# Patient Record
Sex: Female | Born: 1945 | Race: White | Hispanic: No | Marital: Married | Smoking: Former smoker
Health system: Southern US, Community
[De-identification: ages and names within clinical notes are randomized; demographics above are authoritative.]

## PROBLEM LIST (undated history)

## (undated) DIAGNOSIS — R0602 Shortness of breath: Secondary | ICD-10-CM

## (undated) DIAGNOSIS — M1990 Unspecified osteoarthritis, unspecified site: Secondary | ICD-10-CM

## (undated) DIAGNOSIS — R1013 Epigastric pain: Secondary | ICD-10-CM

## (undated) DIAGNOSIS — M545 Low back pain, unspecified: Secondary | ICD-10-CM

## (undated) DIAGNOSIS — E785 Hyperlipidemia, unspecified: Secondary | ICD-10-CM

## (undated) DIAGNOSIS — I209 Angina pectoris, unspecified: Secondary | ICD-10-CM

## (undated) DIAGNOSIS — F329 Major depressive disorder, single episode, unspecified: Secondary | ICD-10-CM

## (undated) DIAGNOSIS — J42 Unspecified chronic bronchitis: Secondary | ICD-10-CM

## (undated) DIAGNOSIS — F32A Depression, unspecified: Secondary | ICD-10-CM

## (undated) DIAGNOSIS — I10 Essential (primary) hypertension: Secondary | ICD-10-CM

## (undated) DIAGNOSIS — Z8719 Personal history of other diseases of the digestive system: Secondary | ICD-10-CM

## (undated) DIAGNOSIS — R51 Headache: Secondary | ICD-10-CM

## (undated) DIAGNOSIS — Z9289 Personal history of other medical treatment: Secondary | ICD-10-CM

## (undated) DIAGNOSIS — R251 Tremor, unspecified: Secondary | ICD-10-CM

## (undated) DIAGNOSIS — E039 Hypothyroidism, unspecified: Secondary | ICD-10-CM

## (undated) DIAGNOSIS — F419 Anxiety disorder, unspecified: Secondary | ICD-10-CM

## (undated) DIAGNOSIS — G8929 Other chronic pain: Secondary | ICD-10-CM

## (undated) DIAGNOSIS — J449 Chronic obstructive pulmonary disease, unspecified: Secondary | ICD-10-CM

## (undated) DIAGNOSIS — K219 Gastro-esophageal reflux disease without esophagitis: Secondary | ICD-10-CM

## (undated) HISTORY — DX: Hyperlipidemia, unspecified: E78.5

## (undated) HISTORY — DX: Depression, unspecified: F32.A

## (undated) HISTORY — PX: VAGINAL HYSTERECTOMY: SUR661

## (undated) HISTORY — DX: Major depressive disorder, single episode, unspecified: F32.9

## (undated) HISTORY — DX: Epigastric pain: R10.13

## (undated) HISTORY — DX: Anxiety disorder, unspecified: F41.9

## (undated) HISTORY — PX: LUMBAR DISC SURGERY: SHX700

## (undated) HISTORY — DX: Tremor, unspecified: R25.1

## (undated) HISTORY — PX: FRACTURE SURGERY: SHX138

## (undated) HISTORY — PX: BACK SURGERY: SHX140

---
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
## 1995-04-19 HISTORY — PX: CARDIAC CATHETERIZATION: SHX172

## 1999-04-14 ENCOUNTER — Other Ambulatory Visit: Admission: RE | Admit: 1999-04-14 | Discharge: 1999-04-14 | Payer: Self-pay | Admitting: Obstetrics and Gynecology

## 1999-04-28 ENCOUNTER — Encounter: Payer: Self-pay | Admitting: Obstetrics and Gynecology

## 1999-04-28 ENCOUNTER — Ambulatory Visit (HOSPITAL_COMMUNITY): Admission: RE | Admit: 1999-04-28 | Discharge: 1999-04-28 | Payer: Self-pay | Admitting: Obstetrics and Gynecology

## 1999-05-02 ENCOUNTER — Ambulatory Visit (HOSPITAL_COMMUNITY): Admission: RE | Admit: 1999-05-02 | Discharge: 1999-05-02 | Payer: Self-pay | Admitting: Obstetrics and Gynecology

## 2000-02-17 ENCOUNTER — Encounter: Payer: Self-pay | Admitting: *Deleted

## 2000-02-17 ENCOUNTER — Inpatient Hospital Stay (HOSPITAL_COMMUNITY): Admission: EM | Admit: 2000-02-17 | Discharge: 2000-02-18 | Payer: Self-pay | Admitting: *Deleted

## 2000-02-18 ENCOUNTER — Encounter: Payer: Self-pay | Admitting: Cardiology

## 2000-04-12 ENCOUNTER — Other Ambulatory Visit: Admission: RE | Admit: 2000-04-12 | Discharge: 2000-04-12 | Payer: Self-pay | Admitting: Obstetrics and Gynecology

## 2000-05-31 ENCOUNTER — Ambulatory Visit (HOSPITAL_COMMUNITY): Admission: RE | Admit: 2000-05-31 | Discharge: 2000-05-31 | Payer: Self-pay | Admitting: Internal Medicine

## 2000-05-31 ENCOUNTER — Encounter: Payer: Self-pay | Admitting: Obstetrics and Gynecology

## 2000-05-31 ENCOUNTER — Ambulatory Visit (HOSPITAL_COMMUNITY): Admission: RE | Admit: 2000-05-31 | Discharge: 2000-05-31 | Payer: Self-pay | Admitting: Obstetrics and Gynecology

## 2000-06-03 ENCOUNTER — Encounter: Payer: Self-pay | Admitting: Internal Medicine

## 2000-06-08 ENCOUNTER — Ambulatory Visit (HOSPITAL_COMMUNITY): Admission: RE | Admit: 2000-06-08 | Discharge: 2000-06-08 | Payer: Self-pay | Admitting: Internal Medicine

## 2000-06-08 ENCOUNTER — Encounter (INDEPENDENT_AMBULATORY_CARE_PROVIDER_SITE_OTHER): Payer: Self-pay | Admitting: *Deleted

## 2000-06-08 ENCOUNTER — Encounter: Payer: Self-pay | Admitting: Internal Medicine

## 2000-06-14 ENCOUNTER — Encounter: Payer: Self-pay | Admitting: Obstetrics and Gynecology

## 2000-06-14 ENCOUNTER — Encounter: Admission: RE | Admit: 2000-06-14 | Discharge: 2000-06-14 | Payer: Self-pay | Admitting: Obstetrics and Gynecology

## 2000-09-23 ENCOUNTER — Encounter (INDEPENDENT_AMBULATORY_CARE_PROVIDER_SITE_OTHER): Payer: Self-pay | Admitting: *Deleted

## 2000-09-23 ENCOUNTER — Ambulatory Visit (HOSPITAL_COMMUNITY): Admission: RE | Admit: 2000-09-23 | Discharge: 2000-09-23 | Payer: Self-pay | Admitting: Internal Medicine

## 2000-09-23 ENCOUNTER — Encounter: Payer: Self-pay | Admitting: Internal Medicine

## 2000-09-24 ENCOUNTER — Encounter (INDEPENDENT_AMBULATORY_CARE_PROVIDER_SITE_OTHER): Payer: Self-pay

## 2000-09-24 ENCOUNTER — Ambulatory Visit (HOSPITAL_COMMUNITY): Admission: RE | Admit: 2000-09-24 | Discharge: 2000-09-24 | Payer: Self-pay | Admitting: Internal Medicine

## 2000-09-25 ENCOUNTER — Encounter: Payer: Self-pay | Admitting: Internal Medicine

## 2001-03-28 ENCOUNTER — Other Ambulatory Visit: Admission: RE | Admit: 2001-03-28 | Discharge: 2001-03-28 | Payer: Self-pay | Admitting: Obstetrics and Gynecology

## 2001-05-28 ENCOUNTER — Encounter: Payer: Self-pay | Admitting: Internal Medicine

## 2001-06-04 ENCOUNTER — Ambulatory Visit (HOSPITAL_COMMUNITY): Admission: RE | Admit: 2001-06-04 | Discharge: 2001-06-04 | Payer: Self-pay | Admitting: Internal Medicine

## 2001-06-04 ENCOUNTER — Encounter (INDEPENDENT_AMBULATORY_CARE_PROVIDER_SITE_OTHER): Payer: Self-pay | Admitting: *Deleted

## 2001-06-04 ENCOUNTER — Encounter: Payer: Self-pay | Admitting: Internal Medicine

## 2001-06-09 ENCOUNTER — Encounter: Admission: RE | Admit: 2001-06-09 | Discharge: 2001-09-07 | Payer: Self-pay | Admitting: Internal Medicine

## 2001-06-16 ENCOUNTER — Ambulatory Visit (HOSPITAL_COMMUNITY): Admission: RE | Admit: 2001-06-16 | Discharge: 2001-06-16 | Payer: Self-pay | Admitting: Obstetrics and Gynecology

## 2001-06-16 ENCOUNTER — Encounter: Payer: Self-pay | Admitting: Obstetrics and Gynecology

## 2001-10-15 ENCOUNTER — Encounter: Admission: RE | Admit: 2001-10-15 | Discharge: 2002-01-13 | Payer: Self-pay | Admitting: Internal Medicine

## 2002-01-19 ENCOUNTER — Encounter: Admission: RE | Admit: 2002-01-19 | Discharge: 2002-04-19 | Payer: Self-pay | Admitting: Internal Medicine

## 2002-03-28 ENCOUNTER — Ambulatory Visit (HOSPITAL_COMMUNITY): Admission: RE | Admit: 2002-03-28 | Discharge: 2002-03-28 | Payer: Self-pay | Admitting: Orthopedic Surgery

## 2002-03-28 ENCOUNTER — Encounter: Payer: Self-pay | Admitting: Orthopedic Surgery

## 2004-11-29 ENCOUNTER — Emergency Department (HOSPITAL_COMMUNITY): Admission: EM | Admit: 2004-11-29 | Discharge: 2004-11-29 | Payer: Self-pay | Admitting: Emergency Medicine

## 2004-12-03 ENCOUNTER — Emergency Department (HOSPITAL_COMMUNITY): Admission: EM | Admit: 2004-12-03 | Discharge: 2004-12-03 | Payer: Self-pay | Admitting: Emergency Medicine

## 2004-12-14 ENCOUNTER — Emergency Department (HOSPITAL_COMMUNITY): Admission: EM | Admit: 2004-12-14 | Discharge: 2004-12-14 | Payer: Self-pay | Admitting: Emergency Medicine

## 2005-01-05 ENCOUNTER — Ambulatory Visit (HOSPITAL_COMMUNITY): Admission: RE | Admit: 2005-01-05 | Discharge: 2005-01-06 | Payer: Self-pay | Admitting: Neurosurgery

## 2005-01-23 ENCOUNTER — Encounter: Admission: RE | Admit: 2005-01-23 | Discharge: 2005-02-01 | Payer: Self-pay | Admitting: Neurosurgery

## 2007-04-02 ENCOUNTER — Emergency Department (HOSPITAL_COMMUNITY): Admission: EM | Admit: 2007-04-02 | Discharge: 2007-04-02 | Payer: Self-pay | Admitting: Family Medicine

## 2007-09-20 ENCOUNTER — Emergency Department (HOSPITAL_COMMUNITY): Admission: EM | Admit: 2007-09-20 | Discharge: 2007-09-20 | Payer: Self-pay | Admitting: Emergency Medicine

## 2007-09-30 ENCOUNTER — Encounter: Admission: RE | Admit: 2007-09-30 | Discharge: 2007-09-30 | Payer: Self-pay | Admitting: Cardiology

## 2007-10-14 ENCOUNTER — Emergency Department (HOSPITAL_COMMUNITY): Admission: EM | Admit: 2007-10-14 | Discharge: 2007-10-14 | Payer: Self-pay | Admitting: Emergency Medicine

## 2007-11-10 ENCOUNTER — Encounter: Admission: RE | Admit: 2007-11-10 | Discharge: 2007-11-10 | Payer: Self-pay | Admitting: Neurosurgery

## 2007-11-25 ENCOUNTER — Encounter: Admission: RE | Admit: 2007-11-25 | Discharge: 2008-01-22 | Payer: Self-pay | Admitting: Neurosurgery

## 2007-12-17 HISTORY — PX: US ECHOCARDIOGRAPHY: HXRAD669

## 2008-03-30 ENCOUNTER — Encounter (INDEPENDENT_AMBULATORY_CARE_PROVIDER_SITE_OTHER): Payer: Self-pay | Admitting: *Deleted

## 2008-03-30 ENCOUNTER — Emergency Department (HOSPITAL_COMMUNITY): Admission: EM | Admit: 2008-03-30 | Discharge: 2008-03-30 | Payer: Self-pay | Admitting: Emergency Medicine

## 2008-04-01 ENCOUNTER — Encounter: Admission: RE | Admit: 2008-04-01 | Discharge: 2008-04-01 | Payer: Self-pay | Admitting: Neurosurgery

## 2008-06-08 ENCOUNTER — Encounter: Admission: RE | Admit: 2008-06-08 | Discharge: 2008-06-08 | Payer: Self-pay | Admitting: Orthopedic Surgery

## 2008-08-20 HISTORY — PX: LAPAROSCOPIC CHOLECYSTECTOMY: SUR755

## 2008-09-30 ENCOUNTER — Encounter: Admission: RE | Admit: 2008-09-30 | Discharge: 2008-09-30 | Payer: Self-pay | Admitting: Cardiology

## 2009-05-20 ENCOUNTER — Inpatient Hospital Stay (HOSPITAL_COMMUNITY): Admission: EM | Admit: 2009-05-20 | Discharge: 2009-05-27 | Payer: Self-pay | Admitting: Emergency Medicine

## 2009-05-22 ENCOUNTER — Encounter (INDEPENDENT_AMBULATORY_CARE_PROVIDER_SITE_OTHER): Payer: Self-pay | Admitting: *Deleted

## 2009-05-23 ENCOUNTER — Encounter (INDEPENDENT_AMBULATORY_CARE_PROVIDER_SITE_OTHER): Payer: Self-pay | Admitting: *Deleted

## 2009-05-24 ENCOUNTER — Encounter (INDEPENDENT_AMBULATORY_CARE_PROVIDER_SITE_OTHER): Payer: Self-pay | Admitting: *Deleted

## 2009-05-27 ENCOUNTER — Encounter (INDEPENDENT_AMBULATORY_CARE_PROVIDER_SITE_OTHER): Payer: Self-pay | Admitting: *Deleted

## 2009-06-19 ENCOUNTER — Emergency Department (HOSPITAL_COMMUNITY): Admission: EM | Admit: 2009-06-19 | Discharge: 2009-06-20 | Payer: Self-pay | Admitting: Emergency Medicine

## 2009-06-20 ENCOUNTER — Encounter (INDEPENDENT_AMBULATORY_CARE_PROVIDER_SITE_OTHER): Payer: Self-pay | Admitting: *Deleted

## 2009-06-24 ENCOUNTER — Inpatient Hospital Stay (HOSPITAL_COMMUNITY): Admission: EM | Admit: 2009-06-24 | Discharge: 2009-06-29 | Payer: Self-pay | Admitting: Emergency Medicine

## 2009-06-24 ENCOUNTER — Encounter (INDEPENDENT_AMBULATORY_CARE_PROVIDER_SITE_OTHER): Payer: Self-pay | Admitting: *Deleted

## 2009-06-26 ENCOUNTER — Encounter (INDEPENDENT_AMBULATORY_CARE_PROVIDER_SITE_OTHER): Payer: Self-pay | Admitting: *Deleted

## 2009-06-26 ENCOUNTER — Encounter (INDEPENDENT_AMBULATORY_CARE_PROVIDER_SITE_OTHER): Payer: Self-pay | Admitting: Surgery

## 2009-06-29 ENCOUNTER — Encounter (INDEPENDENT_AMBULATORY_CARE_PROVIDER_SITE_OTHER): Payer: Self-pay | Admitting: *Deleted

## 2009-10-24 ENCOUNTER — Emergency Department (HOSPITAL_COMMUNITY): Admission: EM | Admit: 2009-10-24 | Discharge: 2009-10-24 | Payer: Self-pay | Admitting: Emergency Medicine

## 2009-11-22 ENCOUNTER — Ambulatory Visit (HOSPITAL_COMMUNITY): Admission: RE | Admit: 2009-11-22 | Discharge: 2009-11-22 | Payer: Self-pay | Admitting: Obstetrics and Gynecology

## 2010-06-10 ENCOUNTER — Emergency Department (HOSPITAL_COMMUNITY): Admission: EM | Admit: 2010-06-10 | Discharge: 2010-06-10 | Payer: Self-pay | Admitting: Emergency Medicine

## 2010-06-12 ENCOUNTER — Encounter (INDEPENDENT_AMBULATORY_CARE_PROVIDER_SITE_OTHER): Payer: Self-pay | Admitting: *Deleted

## 2010-06-12 ENCOUNTER — Emergency Department (HOSPITAL_COMMUNITY): Admission: EM | Admit: 2010-06-12 | Discharge: 2010-06-12 | Payer: Self-pay | Admitting: Emergency Medicine

## 2010-06-20 ENCOUNTER — Encounter: Payer: Self-pay | Admitting: Internal Medicine

## 2010-06-20 ENCOUNTER — Ambulatory Visit: Payer: Self-pay | Admitting: Cardiology

## 2010-06-22 ENCOUNTER — Encounter (INDEPENDENT_AMBULATORY_CARE_PROVIDER_SITE_OTHER): Payer: Self-pay | Admitting: *Deleted

## 2010-06-22 ENCOUNTER — Telehealth: Payer: Self-pay | Admitting: Internal Medicine

## 2010-06-23 ENCOUNTER — Ambulatory Visit: Payer: Self-pay | Admitting: Gastroenterology

## 2010-06-23 ENCOUNTER — Encounter: Payer: Self-pay | Admitting: Internal Medicine

## 2010-06-23 DIAGNOSIS — R197 Diarrhea, unspecified: Secondary | ICD-10-CM | POA: Insufficient documentation

## 2010-06-23 DIAGNOSIS — R142 Eructation: Secondary | ICD-10-CM

## 2010-06-23 DIAGNOSIS — R143 Flatulence: Secondary | ICD-10-CM

## 2010-06-23 DIAGNOSIS — R1084 Generalized abdominal pain: Secondary | ICD-10-CM | POA: Insufficient documentation

## 2010-06-23 DIAGNOSIS — R141 Gas pain: Secondary | ICD-10-CM | POA: Insufficient documentation

## 2010-06-23 DIAGNOSIS — R198 Other specified symptoms and signs involving the digestive system and abdomen: Secondary | ICD-10-CM | POA: Insufficient documentation

## 2010-06-23 DIAGNOSIS — K310 Acute dilatation of stomach: Secondary | ICD-10-CM | POA: Insufficient documentation

## 2010-06-23 DIAGNOSIS — E785 Hyperlipidemia, unspecified: Secondary | ICD-10-CM | POA: Insufficient documentation

## 2010-06-23 DIAGNOSIS — I10 Essential (primary) hypertension: Secondary | ICD-10-CM

## 2010-06-23 LAB — CONVERTED CEMR LAB
Basophils Absolute: 0 10*3/uL (ref 0.0–0.1)
Eosinophils Relative: 2.2 % (ref 0.0–5.0)
HCT: 40.3 % (ref 36.0–46.0)
Hemoglobin: 13.8 g/dL (ref 12.0–15.0)
Lymphs Abs: 2.2 10*3/uL (ref 0.7–4.0)
MCV: 98.2 fL (ref 78.0–100.0)
Monocytes Absolute: 0.6 10*3/uL (ref 0.1–1.0)
Monocytes Relative: 7.9 % (ref 3.0–12.0)
Neutro Abs: 4.5 10*3/uL (ref 1.4–7.7)
Platelets: 173 10*3/uL (ref 150.0–400.0)
RDW: 13.1 % (ref 11.5–14.6)

## 2010-06-27 ENCOUNTER — Ambulatory Visit: Payer: Self-pay | Admitting: Internal Medicine

## 2010-06-29 ENCOUNTER — Ambulatory Visit: Payer: Self-pay | Admitting: Cardiology

## 2010-07-18 ENCOUNTER — Telehealth: Payer: Self-pay | Admitting: Physician Assistant

## 2010-07-24 ENCOUNTER — Telehealth: Payer: Self-pay | Admitting: Physician Assistant

## 2010-07-28 DIAGNOSIS — Z860100 Personal history of colon polyps, unspecified: Secondary | ICD-10-CM | POA: Insufficient documentation

## 2010-07-28 DIAGNOSIS — D3500 Benign neoplasm of unspecified adrenal gland: Secondary | ICD-10-CM | POA: Insufficient documentation

## 2010-07-28 DIAGNOSIS — K5730 Diverticulosis of large intestine without perforation or abscess without bleeding: Secondary | ICD-10-CM | POA: Insufficient documentation

## 2010-07-28 DIAGNOSIS — J449 Chronic obstructive pulmonary disease, unspecified: Secondary | ICD-10-CM

## 2010-07-28 DIAGNOSIS — F419 Anxiety disorder, unspecified: Secondary | ICD-10-CM

## 2010-07-28 DIAGNOSIS — I252 Old myocardial infarction: Secondary | ICD-10-CM | POA: Insufficient documentation

## 2010-08-03 ENCOUNTER — Ambulatory Visit: Payer: Self-pay | Admitting: Internal Medicine

## 2010-08-03 DIAGNOSIS — R12 Heartburn: Secondary | ICD-10-CM

## 2010-08-20 HISTORY — PX: CATARACT EXTRACTION W/ INTRAOCULAR LENS  IMPLANT, BILATERAL: SHX1307

## 2010-08-24 ENCOUNTER — Ambulatory Visit: Admit: 2010-08-24 | Payer: Self-pay | Admitting: Internal Medicine

## 2010-08-29 ENCOUNTER — Ambulatory Visit
Admission: RE | Admit: 2010-08-29 | Discharge: 2010-08-29 | Payer: Self-pay | Source: Home / Self Care | Attending: Internal Medicine | Admitting: Internal Medicine

## 2010-08-29 ENCOUNTER — Encounter: Payer: Self-pay | Admitting: Internal Medicine

## 2010-09-04 ENCOUNTER — Encounter: Payer: Self-pay | Admitting: Internal Medicine

## 2010-09-19 NOTE — Discharge Summary (Signed)
Summary: Percutaneous Cholecystostomy Drain  NAMESIRENITY, Traci [name]                ACCOUNT NO.:  [id number]      MEDICAL RECORD NO.:  [id number]          PATIENT TYPE:  INP      LOCATION:  5156                         FACILITY:  [facility]      PHYSICIAN:  Maisie Fus A. Cornett, M.D.DATE OF BIRTH:  [birth year]      DATE OF ADMISSION:  05/20/2009   DATE OF DISCHARGE:  05/27/2009                                  DISCHARGE SUMMARY      PRIMARY CARDIOLOGIST:  Cassell Clement, MD      ADMITTING PHYSICIAN:  Vesta Mixer, MD      PROCEDURES:   1. Cardiac catheterization by Dr. Elease Hashimoto on May 23, 2009, showing       mild coronary artery disease and a small obtuse marginal artery       that appears to be subtotally occluded which could have caused her       mild troponin elevation.   2. Percutaneous cholecystostomy drain placement on May 24, 2009, by       Dr. Irish Lack.      CONSULTANTS:   1. Dr. Luisa Hart with [facility].   2. Dr. Fredia Sorrow with interventional radiology.      REASON FOR ADMISSION:  Traci [name] is a 65 year old female with a history   of COPD, hyperlipidemia, and hypertension, who presents with 2-day   history of fairly constant epigastric pain.  The pain is worse with   eating but is not associated to exertion.  The pain is unrelenting and   constant, and she presented to the emergency department with nausea and   some vomiting.  At this time, she was admitted due to epigastric/chest   pain to rule out any cardiac possibilities.  Please see admitting   history and physical for further details.      ADMITTING DIAGNOSES:   1. Abdominal pain rule out abdominal source/cardiac source.   2. Chronic obstructive pulmonary disease.      HOSPITAL COURSE:  At this time, the patient was admitted.  Her chest x-   ray revealed a calcified granuloma of the left upper lobe but otherwise   is normal.  Her EKG was also normal.  They did repeat cardiac enzymes   which on  her second hospital day revealed a bump in her troponin up to   1.5.  Due to this bump, the patient had a CT angiogram of the chest   which revealed no evidence of pulmonary embolus or other active disease   but showed some mild pulmonary emphysema.  The patient then had an   ultrasound of the abdomen to try and determine where her course of pain   was coming from and this revealed multiple gallstones with some   gallbladder wall thickening and a positive Murphy sign with findings   suspicious for acute cholecystitis.  The following day due to her   elevation in troponin the patient underwent a cardiac catheterization   with the finding of mild coronary artery disease.  Otherwise, her   cardiac  catheterization was essentially normal, and general surgery was   consulted to assess the patient for her ultrasound findings consistent   with acute cholecystitis.  At this time, the patient has already had   approximately 5 days of abdominal pain, and her gallbladder wall   thickening was approximately 1 cm.  At this time, it was felt that the   risks of surgery may be higher due to the extended length of pain and   the thickness of her gallbladder wall.  Therefore, it was decided that   the patient would get a percutaneous cholecystostomy drain.  The patient   agreed to this, then the following day this was placed.  The patient   tolerated this procedure well, and over the next several days the   patient's diet was advanced, and by May 27, 2009, the patient was   tolerating a regular diet and having some soreness secondary to her   drain but otherwise stable.      The patient showed some signs and symptoms of deconditioning while in   the hospital.  Therefore, PT and OT evaluation was requested.  At this   time, physical therapy has evaluated the patient and requested for home   health physical therapy.  I am currently awaiting occupational therapy   evaluation to determine whether this is  needed at home as well.   Otherwise, the patient is stable for discharge home with home health to   help manage her drain as well.      DISCHARGE DIAGNOSES:   1. Acute on chronic cholecystitis.   2. Status post percutaneous cholecystostomy drain placement.   3. Chronic obstructive pulmonary disease.   4. Mild coronary artery disease.   5. Hyperlipidemia.   6. Hypertension.      DISCHARGE MEDICATIONS:  The patient is to resume all home medications   including:   1. Vicodin 5/325 one to two p.o. q.4 h. p.r.n. pain.   2. Lovastatin 20 mg daily.   3. Wellbutrin 100 mg 2 tablets daily.   4. Atenolol 50 mg half a tablet b.i.d.      The patient states she is out of Vicodin and so new prescription for   Vicodin is given.      DISCHARGE INSTRUCTIONS:  The patient may resume normal activity and   increase her activity slowly and walk up steps.  She may shower with her   drain; however, she is not to bathe.  She is to keep the area around her   drain as dry as possible.  She is to resume a low-sodium, heart-healthy,   low-fat diet.  She is to call our office for fever greater than 101.5 or   worsening abdominal pain.  She is to change the dressing to her drain   daily and flush with 10 mL of normal saline once daily.  Otherwise, she   is to record her daily outputs per interventional radiology and she is   to call them with any drain concerns at [phone].  Otherwise, she is to   follow up with Dr. Luisa Hart at our office in 2-3 weeks and with Dr.   Patty Sermons as already scheduled.               Letha Cape, PA               Maisie Fus A. Cornett, M.D.   Electronically Signed         KEO/MEDQ  D:  05/27/2009  T:  05/27/2009  Job:  [id number]      cc:   Vesta Mixer, M.D.   Cassell Clement, M.D.

## 2010-09-19 NOTE — Procedures (Addendum)
Summary: Colonoscopy  Patient: [name] Note: All result statuses are Final unless otherwise noted.  Tests: (1) Colonoscopy (COL)   COL Colonoscopy           DONE     [facility]     [address]     [place], [place]  [address]          COLONOSCOPY PROCEDURE REPORT          PATIENT:  Traci [name], Traci [name]  MR#:  [id number]     BIRTHDATE:  05-May-1946, 63 yrs. old  GENDER:  female     ENDOSCOPIST:  Hedwig Morton. Juanda Chance, MD     REF. BY:  Cassell Clement, M.D.     PROCEDURE DATE:  06/27/2010     PROCEDURE:  Colonoscopy 10272     ASA CLASS:  Class II     INDICATIONS:  history of pre-cancerous (adenomatous) colon polyps     last colon 2002, abd. pain, bloating     MEDICATIONS:   Versed 5 mg, Fentanyl 50 mcg          DESCRIPTION OF PROCEDURE:   After the risks benefits and     alternatives of the procedure were thoroughly explained, informed     consent was obtained.  Digital rectal exam was performed and     revealed no rectal masses.   The LB [id number]-[id number] endoscope     was introduced through the anus and advanced to the cecum, which     was identified by both the appendix and ileocecal valve, without     limitations.  The quality of the prep was good, using MiraLax.     The instrument was then slowly withdrawn as the colon was fully     examined.     <<PROCEDUREIMAGES>>          FINDINGS:  No polyps or cancers were seen (see image1, image2, and     image3).   Retroflexed views in the rectum revealed no     abnormalities.    The scope was then withdrawn from the patient     and the procedure completed.          COMPLICATIONS:  None     ENDOSCOPIC IMPRESSION:     1) No polyps or cancers     2) Normal colonoscopy     RECOMMENDATIONS:     1) high fiber diet     REPEAT EXAM:  In 10 year(s) for.          ______________________________     Hedwig Morton. Juanda Chance, MD          CC:          n.     eSIGNED:   Hedwig Morton. Brodie at 06/27/2010 08:29 AM          [place], [place], [id number]  Note: An exclamation mark (!) indicates a result that was not dispersed into the flowsheet. Document Creation Date: 06/27/2010 8:29 AM _______________________________________________________________________  (1) Order result status: Final Collection or observation date-time: 06/27/2010 08:23 Requested date-time:  Receipt date-time:  Reported date-time:  Referring Physician:   Ordering Physician: Lina Sar [phone]) Specimen Source:  Source: Launa Grill Order Number: [id number] Lab site:   Appended Document: Colonoscopy    Clinical Lists Changes  Observations: Added new observation of COLONNXTDUE: 06/2020 (06/27/2010 13:11)

## 2010-09-19 NOTE — Consult Note (Signed)
Summary: Cholelthiasis  Traci [name], Traci [name]                ACCOUNT NO.:  [id number]      MEDICAL RECORD NO.:  [id number]          PATIENT TYPE:  INP      LOCATION:  3701                         FACILITY:  [facility]      PHYSICIAN:  Maisie Fus A. Cornett, M.D.DATE OF BIRTH:  [birth year]      DATE OF CONSULTATION:  05/23/2009   DATE OF DISCHARGE:                                    CONSULTATION      REQUESTING PHYSICIAN:  Cassell Clement, MD      PRIMARY CARDIOLOGIST AND PRIMARY PHYSICIAN:  Cassell Clement, MD      REASON FOR CONSULTATION:  Cholelithiasis.      HISTORY OF PRESENT ILLNESS:  Traci [name] is a 65 year old [place] female   with a history of chronic obstructive pulmonary disease, hypertension,   hyperlipidemia, anxiety, and depression who began having epigastric and   right upper quadrant abdominal pain last Thursday night.  She states   this awoken her in the middle of the night and describes it as a sharp   stabbing pain that then becomes nagging and constant.  She states this   is not associated with food.  She then developed nausea and emesis.  She   then presented to the emergency department on Saturday due to her   continued pain.  At this time, she had an extensive cardiac workup.  She   did have an elevation in her troponins up to 1.5; however, these have   come down to 0.85 at this time.  She had a cardiac catheterization   today, which showed mild coronary artery disease and a partially   occluded obtuse marginal vessel that may be contributing to her   increased troponin.  She otherwise has had ultrasound of her abdomen,   which showed gallstones and gallbladder wall thickening of 10 mm.  At   this time due to an essentially negative cardiac catheterization, it was   felt the patient's pain may be due to her gallbladder and therefore we   were asked to assess the patient.      REVIEW OF SYSTEMS:  Please see HPI.  Otherwise, the patient does admit   to having similar  symptoms and episodes like this in the past; however,   they have resolved.  She denies any abdominal association with eating.   She does admit to continued abdominal pain and continued nausea, but no   further emesis.      PAST MEDICAL HISTORY:   1. COPD.   2. Hypertension.   3. Hyperlipidemia.   4. Anxiety and depression.      PAST SURGICAL HISTORY:   1. Prior back surgery.   2. Partial hysterectomy.      SOCIAL HISTORY:  The patient is married.  She does have a daughter who   is currently in the room with her.  She does admit to smoking just over   a pack of cigarettes a day; however, she denies any use of alcohol.      ALLERGIES:  ASPIRIN and according  to the chart CEPHALOSPORINS.      MEDICATIONS:   1. Atenolol 25 mg daily.   2. Wellbutrin 200 mg daily.   3. Trazodone 300 mg a day.   4. Lovastatin 20 mg a day.      PHYSICAL EXAMINATION:  GENERAL:  Traci [name] is a very pleasant 65-year-   old [place] female who is currently somewhat sedated secondary to her   cardiac catheterization, but otherwise in no acute distress.   VITAL SIGNS:  Temperature 99.2, pulse 81, respirations 20, blood   pressure 124/74.   HEENT:  Head is normocephalic, atraumatic.  Sclerae noninjected.  Pupils   are equal, round, and reactive to light.  Ears and nose without any   obvious masses or lesions.  No rhinorrhea.  Mouth is pink and moist.   Throat shows no exudate.   NECK:  Supple.  Trachea is midline.   HEART:  Regular rate and rhythm.  Normal S1 and S2.  No murmurs,   gallops, or rubs are noted.  +2 carotid rate and pedal pulses   bilaterally.   LUNGS:  Clear to auscultation bilaterally with no wheezes, rhonchi, or   rales noted.  Respiratory effort is nonlabored.   ABDOMEN:  Soft, but tender in the epigastrium and in the right upper   quadrant with a positive Murphy sign.  She does have active bowel sounds   and is otherwise nondistended.  No masses or hernias are noted.   MUSCULOSKELETAL:   All four extremities are symmetrical with no cyanosis,   clubbing, or edema.   NEURO:  Cranial nerves II through XII appear to be grossly intact.  Deep   tendon reflex exam is deferred at this time.   Psych:  The patient is alert and oriented x3 with an appropriate affect.      LABS AND DIAGNOSTICS:  Troponin is currently 0.85, which is down from   1.5.  [place] blood cell count 8700, hemoglobin 13.4, hematocrit 39.3,   platelet count is 155,000.  AST is 17, ALT 18, alkaline phosphatase 50,   total bilirubin 0.6.  Ultrasound of the abdomen reveals cholelithiasis   with gallbladder wall thickening up to 10 mm consistent with acute   cholecystitis.  CT angio of the chest is negative for pulmonary embolus.      IMPRESSION:   1. Abdominal pain consistent with acute cholecystitis.   2. Hypertension.   3. Chronic obstructive pulmonary disease.   4. Hyperlipidemia.   5. History of anxiety and depression.      PLAN:  The patient is currently 5 days out from when her pain began on   Thursday.  The patient is at somewhat higher risk for complication due   to the amount of inflammation present of the gallbladder.  Therefore,   the patient may benefit from a percutaneous drain of the gallbladder   versus antibiotics and an elective resection.  Currently, the   patient's pain has slightly improved; however, it is still present, and   therefore I will discuss this patient with Dr. Luisa Hart to further   determine an appropriate path for the patient.  In the meantime, we will   check a CBC as well as CMET in the morning.               Letha Cape, PA               Maisie Fus A. Cornett, M.D.   Electronically Signed  KEO/MEDQ  D:  05/23/2009  T:  05/24/2009  Job:  [id number]      cc:   Cassell Clement, M.D.   Vesta Mixer, M.D.

## 2010-09-19 NOTE — Progress Notes (Signed)
Summary: triage  Phone Note From Other Clinic Call back at [phone]   Caller: Juliette Alcide, nurse Call For: Dr. Juanda Chance Reason for Call: Schedule Patient Appt Summary of Call: Dr. Patty Sermons referred pt for "lower abd pain" and pt was sch'ed for next available... when Juliette Alcide called pt to give her appt time/date, pt said she cant wait that long and that she has more symptoms than just abd pain; also experiencing abd swelling and immediate BM after eating... Juliette Alcide asked that you call her to discuss triage appt rather than the pt Initial call taken by: Vallarie Mare,  June 22, 2010 8:54 AM  Follow-up for Phone Call        Spoke with [facility] @ Dr. Yevonne Pax office. Patient scheduled with Mike Gip, PA on 06/23/10 @ 9:30 AM. Follow-up by: Jesse Fall RN,  June 22, 2010 9:34 AM     Appended Document: triage Did call pt to verify  that Dr. Yevonne Pax office advised her of the appt she has with Mike Gip PA-C  tomorrow 06-23-10. I advised her to bring ins card, and list of medications.

## 2010-09-19 NOTE — Progress Notes (Signed)
Summary: Questions  Phone Note Call from Patient Call back at Home Phone [phone]   Caller: Patient Call For: Mike Gip Reason for Call: Talk to Nurse Summary of Call: Pt is calling about her ct results, says she never heard the results Initial call taken by: [place] Johnson,  July 24, 2010 3:49 PM  Follow-up for Phone Call        The pt is still having some pain. She is some better.  I let her know per Amy that the CT scan did not show diverticulitis. It did show diverticulosis.  I made her an appt for 08-03-2010

## 2010-09-19 NOTE — Assessment & Plan Note (Signed)
Summary: abd pain, abd swelling, BM immediately after eating/Traci [name]   History of Present Illness Visit Type: Initial Consult Primary GI MD: Sheryn Bison MD FACP FAGA Primary Provider: Pecola Lawless, MD Requesting Provider: Pecola Lawless, MD Chief Complaint: abdominal pain and swelling  Has loose BMs immediately after eating History of Present Illness:   Traci [name] 65 YO FEMALE  REFERRED TODAY FOR C/ ABDOMINAL PAIN AND SWELLING. SHE IS KNOWN REMOTELY TO DR. Juanda Chance FROM COLONOSCOPY IN 2002 AT WHICH TIME SHE HAD ADENOMATOUS POLYPS.  SHE SAYS SHE HAS BEEN HAVING HER CURRENT SXS OVER THE PAST COUPLE YEARS BUT HAS BEEN MORE CONSTANT OVER THE PAST 6-8 MONTHS. SHE DESCRIBES A "PULLING" FEELING ON BOTH SIDES OF HER ABDOMEN , AND A SENSATION OF BLOATING OR "TIGHTNESS". HER STOOLS ARE LOOSE AND SOMETIMES DARK-THOUGH THIS HAS BEEN CHRONIC AND WAS PRESENT PRIOR TO HER GB SURGERY LAST FALL IN OCT. 2010.Traci KitchenSHE SAYS SHE SEES OCCASIONAL SCANT AMTS OF BRB WHICH SHE ATTRIBUTES TO HEMORRHOIDS.  APPETITE FAIR,WEIGHT ACTUALLY UP 20 POUNDS OVER THE PAST 2 YEARS. SHE HAS BEEN PLACED ON OMEPRAZOLE WHICH HELPS HER HEARTBURN BUT HAS NOT HELPED HER OTHER SXS.   GI Review of Systems    Reports abdominal pain, acid reflux, and  nausea.     Location of  Abdominal pain: generalized.    Denies belching, bloating, chest pain, dysphagia with liquids, dysphagia with solids, heartburn, loss of appetite, vomiting, vomiting blood, weight loss, and  weight gain.      Reports change in bowel habits.     Denies anal fissure, black tarry stools, constipation, diarrhea, diverticulosis, fecal incontinence, heme positive stool, hemorrhoids, irritable bowel syndrome, jaundice, light color stool, liver problems, rectal bleeding, and  rectal pain. Preventive Screening-Counseling & Management  Alcohol-Tobacco     Smoking Status: current      Drug Use:  no.      Current Medications (verified): 1)  Lovastatin 20 Mg Tabs (Lovastatin)  .Traci [name].. 1 By Mouth Once Daily 2)  Bupropion Hcl 200 Mg Xr12h-Tab (Bupropion Hcl) .Traci [name].. 1 By Mouth Once Daily 3)  Citalopram Hydrobromide 40 Mg Tabs (Citalopram Hydrobromide) .Traci [name].. 1 By Mouth Once Daily 4)  Atenolol 50 Mg Tabs (Atenolol) .... 1/2 Tablet By Mouth Two Times A Day 5)  Omeprazole 20 Mg Cpdr (Omeprazole) .Traci [name].. 1-2 By Mouth Once Daily 6)  Trazodone Hcl 150 Mg Tabs (Trazodone Hcl) .... 2 By Mouth At Bedtime  Allergies (verified): 1)  ! Cephalexin  Past History:  Past Medical History: AFib Hypertension HYPERLIPIDEMIA COLON POLYPS/ADENOMATOUS 2002  Past Surgical History: Hysterectomy  Back surgery Cholecystectomy  Family History: Reviewed history and no changes required. No FH of Colon Cancer: Family History of Heart Disease:  Uterine Cancer-daughter  Social History: Reviewed history and no changes required. Married   2 girls Patient currently smokes. 1 PPD Alcohol Use - no Daily Caffeine Use  4 per day Illicit Drug Use - no Smoking Status:  current Drug Use:  no  Review of Systems       The patient complains of back pain, shortness of breath, and sleeping problems.  The patient denies allergy/sinus, anemia, anxiety-new, arthritis/joint pain, blood in urine, breast changes/lumps, change in vision, confusion, cough, coughing up blood, depression-new, fainting, fatigue, fever, headaches-new, hearing problems, heart murmur, heart rhythm changes, itching, menstrual pain, muscle pains/cramps, night sweats, nosebleeds, pregnancy symptoms, skin rash, sore throat, swelling of feet/legs, swollen lymph glands, thirst - excessive , urination - excessive , urination changes/pain, urine leakage, vision changes, and voice change.  SEE HPI  Vital Signs:  Patient profile:   65 year old female Height:      69 inches Weight:      178 pounds BMI:     26.38 Pulse rate:   76 / minute Pulse rhythm:   regular BP sitting:   92 / 68  (left arm)  Vitals Entered By: Milford Cage  NCMA (June 23, 2010 9:13 AM)  Physical Exam  General:  Well developed, well nourished, no acute distress. Head:  Normocephalic and atraumatic. Eyes:  PERRLA, no icterus. Lungs:  Clear throughout to auscultation.decreased BS bilateral.   Heart:  Regular rate and rhythm; no murmurs, rubs,  or bruits. Abdomen:  SOFT, NO VISIBLE DISTENTION, MILD RATHER DIFFUSE TENDERNESS, NO MASS OR HSM,BS+ Rectal:  HEME NEGATIVE STOOL Extremities:  No clubbing, cyanosis, edema or deformities noted. Neurologic:  Alert and  oriented x4;  grossly normal neurologically. Psych:  Alert and cooperative. Normal mood and affect.   Impression & Recommendations:  Problem # 1:  ABDOMINAL PAIN, GENERALIZED (ICD-789.07) Assessment Deteriorated 65 YO FEMALE  S/P LAP CHOLE 10/10 WITH C/O CHRONIC LOOSE STOOLS PRECEEDING CHOLECYSTECTOMY, ABDOMINAL BLOATING DISTENTION AND DISCOMFORT OF SEVERAL MONTH DURATION. ETIOLOGY IS NOT CLEAR.   LABS TODAY CT ABD/PELVIS  PT IS OVERDUE FOR FOLLOW UP COLONOSCOPY -WILL SCHEDULE WITH DR. Marcille Buffy IN DETAIL WITH PT. CONTINUE OMEPRAZOLE 20 MG DAILY FOR NOW. Orders: TLB-CRP-High Sensitivity (C-Reactive Protein) (86140-FCRP) TLB-CBC Platelet - w/Differential (85025-CBCD) CT Abdomen/Pelvis with Contrast (CT Abd/Pelvis w/con) Colonoscopy (Colon)  Problem # 2:  HYPERLIPIDEMIA (ICD-272.4) Assessment: Comment Only  Problem # 3:  HYPERTENSION (ICD-401.9) Assessment: Comment Only  Problem # 4:  PERSONAL HISTORY OF COLONIC POLYPS (ICD-V12.72) Assessment: Comment Only LAST COLON 2002- ADENOMATOUS POLYPS- DUE FOR FOLLOW UP;SCHEDULED AS ABOVE Orders: CT Abdomen/Pelvis with Contrast (CT Abd/Pelvis w/con) Colonoscopy (Colon)  Patient Instructions: 1)  Please go to lab, basement level. 2)  We scheduled the Colonoscopy with Dr. Lina Sar on 06-27-10. 3)  Directions and brochure provided. 4)  The prescription for the colonoscopy prep was sent to [facility].   5)   [facility] Patient Information Guide given to patient. 6)  We scheduled the CT scan for Thurs 06-29-10.   7)  Directions and contrast  provided. 8)  Copy sent to : Cassell Clement, Md 9)  The medication list was reviewed and reconciled.  All changed / newly prescribed medications were explained.  A complete medication list was provided to the patient / caregiver. Prescriptions: DULCOLAX 5 MG  TBEC (BISACODYL) Day before procedure take 2 at 3pm and 2 at 8pm.  #4 x 0   Entered by:   Lowry Ram NCMA   Authorized by:   Sammuel Cooper PA-c   Signed by:   Lowry Ram NCMA on 06/23/2010   Method used:   Electronically to        [facility] [address] #[id number]* (retail)       [address] [place], [place]  [address]       Ph: [phone]       Fax: [phone]   RxID:   [id number] METOCLOPRAMIDE HCL 10 MG  TABS (METOCLOPRAMIDE HCL) As per prep instructions.  #2 x 0   Entered by:   Lowry Ram NCMA   Authorized by:   Sammuel Cooper PA-c   Signed by:   Lowry Ram NCMA on 06/23/2010   Method used:   Electronically to  [facility] [address] #[id number]* (retail)       [address] [place], [place]  [address]       Ph: [phone]       Fax: [phone]   RxID:   [id number] MIRALAX   POWD (POLYETHYLENE GLYCOL 3350) As per prep  instructions.  #255gm x 0   Entered by:   Lowry Ram NCMA   Authorized by:   Sammuel Cooper PA-c   Signed by:   Lowry Ram NCMA on 06/23/2010   Method used:   Electronically to        [facility] [address] [id number]* (retail)       [address] [place], [place]  [address]       Ph: [phone]       Fax: [phone]   RxID:   [id number]

## 2010-09-19 NOTE — Progress Notes (Signed)
Summary: CT results  Phone Note Call from Patient Call back at Home Phone [phone]   Caller: Patient Call For: Mike Gip Reason for Call: Talk to Nurse Details for Reason: CT Results Summary of Call: Pt. called  to get results of CT. Please call and advise. Initial call taken by: Schuyler Amor,  July 18, 2010 2:27 PM  Follow-up for Phone Call        Informed the pt that I routed the CT results to Amy in the system and she will see them tomorrow.  I did urge her that she did have some diverticulosis but no diverticulitis.  The pt understood all of this due to researching it. I told her if Amy has any further instruction for her we will call her.  She thanked me for calling. Follow-up by: Joselyn Glassman,  July 18, 2010 4:46 PM     Appended Document: CT results [facility]

## 2010-09-19 NOTE — Letter (Signed)
Summary: New Patient letter  [facility]  [address] [place], [place] [address]   Phone: [phone]  Fax: [phone]       06/22/2010 MRN: [id number]  [facility] [address] [place], [place]  [address]  Dear Ms. Daman,  Welcome to the Gastroenterology Division at [facility].    You are scheduled to see Dr. Juanda Chance on 08/24/2010 at 9:15AM on the 3rd floor at [facility], [address]  We ask that you try to arrive at our office 15 minutes prior to your appointment time to allow for check-in.  We would like you to complete the enclosed self-administered evaluation form prior to your visit and bring it with you on the day of your appointment.  We will review it with you.  Also, please bring a complete list of all your medications or, if you prefer, bring the medication bottles and we will list them.  Please bring your insurance card so that we may make a copy of it.  If your insurance requires a referral to see a specialist, please bring your referral form from your primary care physician.  Co-payments are due at the time of your visit and may be paid by cash, check or credit card.     Your office visit will consist of a consult with your physician (includes a physical exam), any laboratory testing he/she may order, scheduling of any necessary diagnostic testing (e.g. x-ray, ultrasound, CT-scan), and scheduling of a procedure (e.g. Endoscopy, Colonoscopy) if required.  Please allow enough time on your schedule to allow for any/all of these possibilities.    If you cannot keep your appointment, please call [phone] to cancel or reschedule prior to your appointment date.  This allows [place] the opportunity to schedule an appointment for another patient in need of care.  If you do not cancel or reschedule by 5 p.m. the business day prior to your appointment date, you will be charged a $50.00 late cancellation/no-show fee.    Thank you for choosing [facility] for your medical needs.  We appreciate the opportunity to care for you.  Please visit [place] at our website  to learn more about our practice.                     Sincerely,                                                             The Gastroenterology Division

## 2010-09-19 NOTE — Op Note (Signed)
Summary: Cholecystectomy  NAME:  Traci [name], Traci [name]                ACCOUNT NO.:  [id number]      MEDICAL RECORD NO.:  [id number]          PATIENT TYPE:  INP      LOCATION:  5125                         FACILITY:  [facility]      PHYSICIAN:  Maisie Fus A. Cornett, M.D.DATE OF BIRTH:  [birth year]      DATE OF PROCEDURE:  06/26/2009   DATE OF DISCHARGE:                                  OPERATIVE REPORT      PREOPERATIVE DIAGNOSIS:  Acute-on-chronic cholecystitis.      POSTOPERATIVE DIAGNOSIS:  Acute-on-chronic cholecystitis.      PROCEDURE:  Laparoscopic cholecystectomy with intraoperative   cholangiogram.      SURGEON:  Thomas A. Cornett, MD      ASSISTANT:  Wilmon Arms. Tsuei, MD      ANESTHESIA:  General endotracheal anesthesia with 0.25% Sensorcaine   local.      ESTIMATED BLOOD LOSS:  100 mL.      SPECIMEN:  Gallbladder to pathology.      DRAIN:  19 Blake drains to her gallbladder fossa.      INDICATIONS FOR PROCEDURE:  The patient is a 65 year old female who was   seen a month ago while an inpatient at [facility] secondary to chest   pain and abdominal pain.  She was found to have acute-on-chronic   cholecystitis and a percutaneous cholecystostomy tube was placed and   temporize her until her medical issues had been resolved.  She called   the office Friday complaining of increasing abdominal pain, especially   with flushing of her cholecystostomy tube and was admitted to the   hospital for further workup.  CT showed no dislodgement of the tube and   a normal Jacob count, but she continued to have significant pain and it   was felt that cholecystectomy at this point is indicated to help relieve   this as the tube was no longer helpful to her.  Her medical issues have   been settled as well, and she is ready for surgery.      DESCRIPTION OF PROCEDURE:  The patient was brought to the operating room   and placed supine.  After induction of general anesthesia, I removed her   cholecystostomy tube.  Abdomen was then prepped and draped in sterile   fashion.  A 1 cm infraumbilical incision was made and dissection was   carried down to her fascia.  The fascia was opened in the midline and   was grabbed between 2 Kochers.  Pursestring suture of 0 Vicryl was   placed and a 12-mm Hasson cannula was placed under direct vision.   Pneumoperitoneum was created to 15 mmHg of CO2, and a laparoscope was   placed.  She had dense adhesions from her transverse colon up to her   gallbladder fossa, it looked like.  An 11-mm subxiphoid port was placed   under direct vision and two 5 mL ports were placed in the right upper   quadrant.  We then began to take the omentum down away from the right  lobe of liver.  We carefully dissected the transverse colon off this   area.  We were very careful stay well away from this and limit the use   of cautery from this as well.  We eventually were able to identify the   gallbladder and it had some acute on chronic signs of cholecystitis.   This was relatively soft and we removed and grabbed it quite easily.   Once we cut the remainder of the omentum and transverse colon away from   this, I carefully examined and saw no evidence of injury to it or any   violation of the serosa.  We then grabbed the gallbladder by its dome   and by its infundibulum and pulled it to the patient's right lower   quadrant.  We used cautery to dissect away some peritoneal adhesions to   expose the cystic duct and cystic artery.  The cystic artery was   anterior.  We went ahead and double clipped this and divided this.  This   exposed her cystic artery much easier.  We created a window behind this   and achieved a critical view.  Clips were placed in the gallbladder side   of the cystic duct and a small incision was made in it.  Through a   separate stab incision, a Cook cholangiogram catheter was introduced,   placed in the cystic duct and held in place by a clip.   Intraoperative   cholangiogram was performed which showed free flow contrast down a   tortuous cystic duct into the common bile duct and the duodenum.  There   was also free flow contrast in the common hepatic duct into the right   and left ductules.  There was some dilation to about 6-8 mm, it looked   like.  We saw no stones, strictures, or extravasation.  We then removed   her catheter.  We placed 3 clips across the cystic duct stump and   divided it.  There was a posterior branch of cystic artery controlled   between clips as well and divided.  Cautery was used to dissect the   gallbladder from the gallbladder fossa.  There was spillage of   gallbladder contents since the gallbladder was significantly inflamed,   but no spillage of stones.  Gallbladder was then placed in an EndoCatch   bag.  I reexamined the right upper quadrant.  There was considerable   oozing, but we irrigated this out with saline until it was relatively   dry.  Surgicel was then placed in the gallbladder bed given the overall   surface area noted from the acute inflammation.  There was no evidence   of leaks or bile or bleeding in the cystic duct or cystic arteries   respectively.  We elected to place a 19 Blake drain into the right upper   quadrant and did this through the most lateral port site.  This was   secured to the skin with 3-0 nylon.  This was then placed to bulb   suction.  Excess irrigation was suctioned out.  We then extracted the   gallbladder through the umbilical port and passed it off the field.   This fascial opening was closed with an already existing pursestring   suture.  We reexamined the gallbladder bed, found to be relatively   hemostatic and then suctioned any excess irrigation, removed her ports   without signs of port site bleeding.  The skin was  closed with 4-0   Monocryl.  Dermabond was applied.  All final counts were correct at this   portion of the case.  The patient was awoken and  taken to recovery in   satisfactory condition.               Thomas A. Cornett, M.D.   Electronically Signed            TAC/MEDQ  D:  06/26/2009  T:  06/27/2009  Job:  [id number]      cc:   Dr. Patty Sermons

## 2010-09-19 NOTE — Discharge Summary (Signed)
Summary: Cholecystitis  [name], ROEDIGER                ACCOUNT NO.:  [id number]      MEDICAL RECORD NO.:  [id number]          PATIENT TYPE:  INP      LOCATION:  5125                         FACILITY:  [facility]      PHYSICIAN:  Velora Heckler, MD      DATE OF BIRTH:  June 15, 1946      DATE OF ADMISSION:  06/24/2009   DATE OF DISCHARGE:  06/29/2009                                  DISCHARGE SUMMARY      HISTORY OF PRESENT ILLNESS:  Ms. Traci [name] is a 65 year old female, who a   little over a month ago, had a bad case of cholecystitis.  She   subsequently underwent percutaneous cholecystostomy drainage due to   possible associated heart history.  Apparently at that time, she had   heart workup including heart catheterization, which was negative.  She   was followed up by Dr. Luisa Hart in the office, and was starting to have   some increased pain.  Decision was made to readmit the patient for   workup of this pain and probable procession of laparoscopic   cholecystectomy.      SUMMARY OF HOSPITAL COURSE:  The patient was readmitted on June 24, 2009, with a diagnosis of chronic cholecystitis, status post   percutaneous cholecystostomy drain.      A CT scan was performed on her admission, which showed stable position   of the percutaneous cholecystostomy tube and no associated abscess or   other fluid collection.  Decision was made then to proceed with   laparoscopic cholecystectomy.  This was carried out by Dr. Luisa Hart on   June 26, 2009.  This was associated with a cholangiogram, negative   for obstruction or filling defect.  The patient's surgery was completed,   and a JP drain was left intact for drainage purposes.  Postoperatively,   the patient had a reasonably uncomplicated course with the exception of   a little bit of dyspepsia that was resolved with Maalox and Pepcid twice   daily as well as a little bit of hypoxia.  The patient is a chronic   smoker and likely has a some  degree of COPD to begin with, but a chest x-   ray was ordered on November 9.  This showed evidence of bibasilar   atelectasis consistent with poor inspiratory effort.  This was rectified   use incentive spirometer, and the patient was given instructions to   continue this at home.  She was otherwise tolerating regular diet and   moving her bowels, and is determined to be stable for discharge as of   June 29, 2009.      DISCHARGE DIAGNOSES:   1. Chronic cholecystitis, status laparoscopic cholecystectomy.   2. Hypertension.   3. Hyperlipidemia.   4. Chronic anxiety.      DISCHARGE PLAN:  The patient will be given discharge instructions   regarding laparoscopic cholecystectomy as well as instructions to empty   her JP drain.  She was given prescription for Vicodin 5/500 one  tablet   q.6 h. p.r.n. pain   as well as resuming her home medications, and I have also instructed the   patient to add Pepcid 20 mg twice daily to her medication regimen.  She   should follow up with her primary care physician for the dyspepsia   and/or smoking related issues and follow up with Dr. Luisa Hart in   approximately 1 week's time for postop evaluation.               Brayton El, PA-C               Velora Heckler, MD   Electronically Signed         KB/MEDQ  D:  06/29/2009  T:  06/29/2009  Job:  [id number]      cc:   Thomas A. Cornett, M.D.

## 2010-09-19 NOTE — Letter (Signed)
Summary: [facility]   Imported By: Sherian Rein 07/10/2010 12:05:08  _____________________________________________________________________  External Attachment:    Type:   Image     Comment:   External Document

## 2010-09-19 NOTE — Op Note (Signed)
Summary: COLON W/BX                         [facility]  Patient:    Traci [name], Traci [name]                       MRN: [id number] Adm. Date:  [id number] Attending:  Mervin Hack CC:         Clovis Pu. Patty Sermons, M.D.   Procedure Report  PROCEDURE:  Colonoscopy.  ENDOSCOPIST:  Hedwig Morton. Juanda Chance, M.D.  INDICATIONS:  This 65 year old [place] female smoker has complaint of diffuse abdominal tenderness and abdominal pain.  She has been tried on an irritable bowel regimen.  CT scan of the abdomen showed a large left adrenal consistent with adrenal adenoma.  CT scan was repeated three months later with no significant change in the appearance of the left adrenal gland.  Because of the abdominal distention and complaints by the patient of continued rectal bleeding, ______ stools and black diarrhea, she is undergoing colonoscopy.  ENDOSCOPE:  Olympus single-channel videoscope.  SEDATION:  Versed 10 mg IV, Demerol 50 mg IV.  FINDINGS:  Olympus single-channel videoscope was passed directly in through the rectum to the sigmoid colon.  Patient was monitored by pulse oximetry; her oxygen saturation were 97-98% on 2 L of nasal O2.  Anal canal showed essentially normal rectal mucosa.  In the rectal ampulla, there were two small polyps which were diminutive, measuring 4 to 5 mm in diameter.  They were sessile and soft.  They were ablated with hot biopsy and sent to pathology. Sigmoid colon was rather tortuous with large haustral folds but no diverticula were seen.  Patient was comfortable throughout the procedure.  Splenic flexure, transverse colon and hepatic flexure were unremarkable and there was a normal ascending colon.  In the cecal pouch, there was another diminutive polyp measuring about 5 mm in diameter.  It was flat and sessile.  It was also ablated with hot biopsy and sent to pathology in a separate specimen bottle. Ileocecal valve itself was normal.  Video-photographs were  obtained. Colonoscope was then retracted and the colon decompressed.  Patient tolerated procedure well.  IMPRESSION 1. Diminutive polyps of the left and right colon, status post polypectomies. 2. No explanation for patients abdominal pain.  PLAN:  I believe the patient has an irritable bowel syndrome and will be treated accordingly with antispasmodics and fiber supplements and also possible tranquilizers or even dietary modification.  She was advised to stop smoking. DD:  09/24/00 TD:  09/25/00 Job: [id number]   FINAL DIAGNOSIS    ***MICROSCOPIC EXAMINATION AND DIAGNOSIS***    CECAL POLYP: TUBULAR ADENOMA. NO HIGH GRADE DYSPLASIA OR   MALIGNANCY IDENTIFIED.    RECTUM, POLYP: HYPERPLASTIC POLYP. NO ADENOMATOUS CHANGE OR   MALIGNANCY IDENTIFIED.    ab   Date Reported: 09/25/2000 Marcie Bal, MD   *** Electronically Signed Out By TAZ ***    Clinical information   Abdominal pain. (tmc)    specimen(s) obtained   1: Cecal polyp   2: Rectum, polyp    Gross Description   I. Received in formalin is a tan, soft tissue fragment that is   submitted in toto. Size: 0.4 cm    II. Received in formalin are tan, soft tissue fragments that are   submitted in toto. Number: 3   Size: 0.3 cm each (SSW:atb 2/5)    ab/

## 2010-09-19 NOTE — Assessment & Plan Note (Signed)
Summary: Gastroenterology     [facility]   GASTROENTEROLOGY OFFICE NOTE  NAMESTEPHANIEANN, [name]            OFFICE NO:  [id number]    DATE:  May 28, 2001      The patient returns for followup of her abdominal distention and bloating.  She was initially evaluated on May 14, 2000 and again seen on September 13, 2000.  She is considered to have irritable bowel syndrome.  Her abdominal distention and bloating is partly due to weight gain, musculoskeletal obesity, as well as smoking and distention as well as acute irritable bowel syndrome.  Extensive gastrointestinal evaluation included upper endoscopy, ultrasound of the upper abdomen, CT scan of the abdomen, as well as colonoscopy.  She continues to smoke.    PHYSICAL EXAMINATION:   Blood pressure 100/70, pulse 70, and weight 158 pounds.  Her lungs show decreased breath sounds.  No wheezes.  Cardiac with normal S1 and normal S2.  Neck is supple without adenopathy.  Thyroid is not enlarged.  Abdomen is mildly obese, tense, voluntary guarding but no tenderness.  There are no ascites.  Bowel sounds are active.  There is no costovertebral angle tenderness.  No palpable mass.  Rectal exam with hemoccult negative stools.  Extremities:  No edema.   IMPRESSION: 1.  Weight gain from her usual of 125 pounds to currently 158 pounds.   2.  Continued smoking with some aerophagia.   3.  Abdominal distention partly due to irritable bowel syndrome and constipation.   PLAN: 1.  Trial of Zelnorm promotility agent, 6 mg p.o. b.i.d.   2.  Refer to nutritionist for weight loss, low-fat diet.   3.  Gastric emptying scan to assess her gastrointestinal motility.     Hedwig Morton. Juanda Chance, M.D.   [id number] D: 05/28/01;    T: 05/29/01;    Job # [id number]

## 2010-09-19 NOTE — Op Note (Signed)
Summary: EGD                         [facility]  Patient:    Traci [name], Traci [name]                       MRN: [id number] Adm. Date:  [id number] Disc. Date: [id number] Attending:  Mervin Hack CC:         Malachi Pro. Ambrose Mantle, M.D.   Procedure Report  NAME OF PROCEDURE:  Upper endoscopy.  INDICATION:  This 65 year old [place] female smoker has complained of early satiety, epigastric pain, abdominal distention and bloating.  She has vomiting on occasion.  Her weight has increased about 15 to 20 pounds.  She takes anti-inflammatory agents, Vioxx and prior to the Celebrex.  She has also been on aspirin 81 mg a day.  She is on Zoloft.  The patient was started on Protonix 40 mg a day approximately two weeks ago with essentially no improvement of her abdominal symptoms.  Ultrasound of the gallbladder is pending.  She is undergoing upper endoscopy for evaluation of her epigastric pain and other GI symptoms.  ENDOSCOPE:  Olympus single-channel video endoscope.  SEDATION:  Versed 9 mg IV, Demerol 25 mg IV.  FINDINGS:  Esophagus:  Olympus single-channel video endoscope passed under direct vision through posterior pharynx into the esophagus.  The patient was monitored by pulse oximetry.  Her oxygen saturations were satisfactory.  Her gag reflex was not present.  Proximate and mid esophagus was normal.  There was a muscular ring at the distal esophagus, proximal to the GE junction.  The GE junction was mildly fibrotic, but there was no stricture.  There were no stricture.  There was no erosions.  Distal to the Z-line was a 3 cm hiatal hernia which was not reducible.  Stomach:  The stomach was insufflated with air and again confirmed presence of hiatal hernia.  Mucosa in the hiatal hernia was normal.  There were no inflammatory changes.  Gastric body and gastric antrum was unremarkable as well as pyloric outlet.  Biopsies were taken from gastric antrum for Clo-test.  Retroflexion of  endoscope revealed normal fundus and cardia.  Duodenum:  The duodenal bulb and descending duodenum was normal.  IMPRESSION: 1. 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 2. Hiatal hernia. 3. Clo-test  PLAN: 1. Increase Protonix to 40 mg p.o. b.i.d. 2. Await results of the ultrasound.  If negative, proceed with CT    scan of the abdomen and pelvis to rule out intra-abdominal process. 3. Stop smoking. 4. Hold aspirin and Vioxx for now. DD:  05/31/00 TD:  05/31/00 Job: [id number]

## 2010-09-19 NOTE — Letter (Signed)
Summary: [facility] [place] Instructions  [facility]  [address] [place], [place] [address]   Phone: [phone]  Fax: [phone]       [facility]    [birth year]    MRN: [id number]       Procedure Day /Date:06-27-10     Arrival Time: 7:30 AM      Procedure Time :8:00 AM     Location of Procedure:                    X     [facility] (4th Floor)  PREPARATION FOR COLONOSCOPY WITH MIRALAX  Starting 4days prior to your procedure 06-23-10 do not eat nuts, seeds, popcorn, corn, beans, peas,  salads, or any raw vegetables.  Do not take any fiber supplements (e.g. Metamucil, Citrucel, and Benefiber). ____________________________________________________________________________________________________   THE DAY BEFORE YOUR PROCEDURE         DATE:06-26-10 DAY: Monday  1   Drink clear liquids the entire day-NO SOLID FOOD  2   Do not drink anything colored red or purple.  Avoid juices with pulp.  No orange juice.  3   Drink at least 64 oz. (8 glasses) of fluid/clear liquids during the day to prevent dehydration and help the prep work efficiently.  CLEAR LIQUIDS INCLUDE: Water Jello Ice Popsicles Tea (sugar ok, no milk/cream) Powdered fruit flavored drinks Coffee (sugar ok, no milk/cream) Gatorade Juice: apple, Recore grape, Sadlon cranberry  Lemonade Clear bullion, consomm, broth Carbonated beverages (any kind) Strained chicken noodle soup Hard Candy  4   Mix the entire bottle of Miralax with 64 oz. of Gatorade/Powerade in the morning and put in the refrigerator to chill.  5   At 3:00 pm take 2 Dulcolax/Bisacodyl tablets.  6   At 4:30 pm take one Reglan/Metoclopramide tablet.  7  Starting at 5:00 pm drink one 8 oz glass of the Miralax mixture every 15-20 minutes until you have finished drinking the entire 64 oz.  You should finish drinking prep around 7:30 or 8:00 pm.  8   If you are nauseated, you may take the 2nd Reglan/Metoclopramide tablet at 6:30  pm.        9    At 8:00 pm take 2 more DULCOLAX/Bisacodyl tablets.     THE DAY OF YOUR PROCEDURE      DATE:  06-27-10 DAY: Tuesday  You may drink clear liquids until _  _  (2 HOURS BEFORE PROCEDURE).   MEDICATION INSTRUCTIONS  Unless otherwise instructed, you should take regular prescription medications with a small sip of water as early as possible the morning of your procedure.        OTHER INSTRUCTIONS  You will need a responsible adult at least 67 years of age to accompany you and drive you home.   This person must remain in the waiting room during your procedure.  Wear loose fitting clothing that is easily removed.  Leave jewelry and other valuables at home.  However, you may wish to bring a book to read or an iPod/MP3 player to listen to music as you wait for your procedure to start.  Remove all body piercing jewelry and leave at home.  Total time from sign-in until discharge is approximately 2-3 hours.  You should go home directly after your procedure and rest.  You can resume normal activities the day after your procedure.  The day of your procedure you should not:   Drive   Make legal decisions  Operate machinery   Drink alcohol   Return to work  You will receive specific instructions about eating, activities and medications before you leave.   The above instructions have been reviewed and explained to me by   _______________________    I fully understand and can verbalize these instructions _____________________________ Date _______

## 2010-09-21 NOTE — Letter (Signed)
Summary: EGD Instructions  [facility]  [address] [place], [place] [address]   Phone: [phone]  Fax: [phone]       Traci [name]    18-Sep-1945    MRN: [id number]       Procedure Day /Date: Tuesday 08/29/10     Arrival Time: 3:00 pm     Procedure Time: 4:00 pm     Location of Procedure:                    _ x _ [facility] (4th Floor)  PREPARATION FOR ENDOSCOPY   On 08/28/10 THE DAY OF THE PROCEDURE:  1.   No solid foods, milk or milk products are allowed after midnight the night before your procedure.  2.   Do not drink anything colored red or purple.  Avoid juices with pulp.  No orange juice.  3.  You may drink clear liquids until 2:00 pm, which is 2 hours before your procedure.                                                                                                CLEAR LIQUIDS INCLUDE: Water Jello Ice Popsicles Tea (sugar ok, no milk/cream) Powdered fruit flavored drinks Coffee (sugar ok, no milk/cream) Gatorade Juice: apple, Weins grape, Champney cranberry  Lemonade Clear bullion, consomm, broth Carbonated beverages (any kind) Strained chicken noodle soup Hard Candy   MEDICATION INSTRUCTIONS  Unless otherwise instructed, you should take regular prescription medications with a small sip of water as early as possible the morning of your procedure.              OTHER INSTRUCTIONS  You will need a responsible adult at least 67 years of age to accompany you and drive you home.   This person must remain in the waiting room during your procedure.  Wear loose fitting clothing that is easily removed.  Leave jewelry and other valuables at home.  However, you may wish to bring a book to read or an iPod/MP3 player to listen to music as you wait for your procedure to start.  Remove all body piercing jewelry and leave at home.  Total time from sign-in until discharge is approximately 2-3 hours.  You should go home  directly after your procedure and rest.  You can resume normal activities the day after your procedure.  The day of your procedure you should not:   Drive   Make legal decisions   Operate machinery   Drink alcohol   Return to work  You will receive specific instructions about eating, activities and medications before you leave.    The above instructions have been reviewed and explained to me by   Lamona Curl CMA Duncan Dull)  August 03, 2010 4:31 PM     I fully understand and can verbalize these instructions _____________________________ Date _________

## 2010-09-21 NOTE — Letter (Signed)
Summary: Patient [facility] Biopsy Results  [facility]  [address] [place], [place] [address]   Phone: [phone]  Fax: [phone]        September 04, 2010 MRN: [id number]    [facility] [address] [place], [place]  [address]    Dear Ms. Pasquarella,  I am pleased to inform you that the biopsies taken during your recent endoscopic examination did not show any evidence of cancer upon pathologic examination.The biopsies show mild gastritis  Additional information/recommendations:  __No further action is needed at this time.  Please follow-up with      your primary care physician for your other healthcare needs.  __ Please call [phone] to schedule a return visit to review      your condition.  _x_ Continue with the treatment plan as outlined on the day of your      exam.  __   Please call us if you are having persistent problems or have questions about your condition that have not been fully answered at this time.  Sincerely,  Hart Carwin MD  This letter has been electronically signed by your physician.  Appended Document: Patient Notice-Endo Biopsy Results Letter Mailed

## 2010-09-21 NOTE — Procedures (Signed)
Summary: Upper Endoscopy  Patient: Traci [name] Note: All result statuses are Final unless otherwise noted.  Tests: (1) Upper Endoscopy (EGD)   EGD Upper Endoscopy       DONE     [facility]     [address]     [place], [place]  [address]          ENDOSCOPY PROCEDURE REPORT          PATIENT:  [name], [name]  MR#:  [id number]     BIRTHDATE:  03/20/1946, 64 yrs. old  GENDER:  female          ENDOSCOPIST:  Reynol Arnone M. Denia Mcvicar, MD     Referred by:  Thomas Brackbill, M.D.          PROCEDURE DATE:  08/29/2010     PROCEDURE:  EGD with biopsy, 43239, Maloney Dilation of Esophagus     ASA CLASS:  Class II     INDICATIONS:  GERD, heartburn imroved since Prelosec increased to     bid          MEDICATIONS:   Versed 5 mg, Fentanyl 50 mcg     TOPICAL ANESTHETIC:  Exactacain Spray          DESCRIPTION OF PROCEDURE:   After the risks benefits and     alternatives of the procedure were thoroughly explained, informed     consent was obtained.  The [facility] GIF-H180 [id number] endoscope was     introduced through the mouth and advanced to the second portion of     the duodenum, without limitations.  The instrument was slowly     withdrawn as the mucosa was fully examined.     <<PROCEDUREIMAGES>>          A hiatal hernia was found (see image1). 1-2 cm hiatal hernia,     reducible  A stricture was found in the distal esophagus (see     image2, image3, and image8). nonobstructing stricture maloney     dilator 48F passed without difficulty  Otherwise the examination     was normal. With standard forceps, a biopsy was obtained and sent     to pathology. antrum, r/o H (see image5 and image6).pylori     Retroflexed views revealed no abnormalities.    The scope was then     withdrawn from the patient and the procedure completed.          COMPLICATIONS:  None          ENDOSCOPIC IMPRESSION:     1) Hiatal hernia     2) Stricture in the distal esophagus     3) Otherwise normal examination    s/p passage of 48F maloney dilator     RECOMMENDATIONS:     1) Anti-reflux regimen to be follow     2) Await biopsy results     continue Prelosec 20mg po bid          REPEAT EXAM:  In 0 year(s) for.          ______________________________     Luverta Korte M. Brit Carbonell, MD          CC:  Thomas Brackbill, M.D.          n.     eSIGNED:   Krisi Azua M. Nealy Hickmon at 08/29/2010 05:06 PM          [place], [place], [id number]  Note: An exclamation mark (!) indicates a result that was not dispersed into the flowsheet. Document   Creation Date: 08/29/2010 5:06 PM _______________________________________________________________________  (1) Order result status: Final Collection or observation date-time: 08/29/2010 16:57 Requested date-time:  Receipt date-time:  Reported date-time:  Referring Physician:   Ordering Physician: Ciclaly Mulcahey ([id number]) Specimen Source:  Source: EndoProS Filler Order Number: [id number] Lab site:  

## 2010-09-21 NOTE — Assessment & Plan Note (Signed)
Summary: F/U Abd pain, saw Mike Gip PA   History of Present Illness Visit Type: Follow-up Visit Primary GI MD: Sheryn Bison MD FACP FAGA Primary Provider: Pecola Lawless, MD Requesting Provider: na Chief Complaint: F/u from visit with Amy for lower abd pain, having BMs right after she eats and bloating. Pt states that she still has lower abd pain and bloating   History of Present Illness:   This is a 65 year old [place] female smoker with COPD who has chronic abdominal distention and discomfort. She just had a recent colonoscopy which was normal and a normal CT scan of the abdomen and pelvis in November 2011. Her past medical history includes COPD and coronary artery disease. She is status post MI, high blood pressure and status post remote cholecystectomy. She has regular soft bowel movements. She has a history of a 3 cm hiatal hernia on an upper endoscopy in 2001 which was done for dysphagia. She had a mild esophageal stricture at that time. She takes Prilosec 20 mg q.a.m. but has stopped and now has frequent heartburn at the end of the day.   GI Review of Systems    Reports abdominal pain and  bloating.     Location of  Abdominal pain: lower abdomen.    Denies acid reflux, belching, chest pain, dysphagia with liquids, dysphagia with solids, heartburn, loss of appetite, nausea, vomiting, vomiting blood, weight loss, and  weight gain.        Denies anal fissure, black tarry stools, change in bowel habit, constipation, diarrhea, diverticulosis, fecal incontinence, heme positive stool, hemorrhoids, irritable bowel syndrome, jaundice, light color stool, liver problems, rectal bleeding, and  rectal pain.    Current Medications (verified): 1)  Lovastatin 20 Mg Tabs (Lovastatin) .Marland Kitchen.. 1 By Mouth Once Daily 2)  Bupropion Hcl 200 Mg Xr12h-Tab (Bupropion Hcl) .Marland Kitchen.. 1 By Mouth Once Daily 3)  Citalopram Hydrobromide 40 Mg Tabs (Citalopram Hydrobromide) .Marland Kitchen.. 1 By Mouth Once Daily 4)  Atenolol 50  Mg Tabs (Atenolol) .... 1/2 Tablet By Mouth Two Times A Day 5)  Omeprazole 20 Mg Cpdr (Omeprazole) .Marland Kitchen.. 1-2 By Mouth Once Daily 6)  Trazodone Hcl 150 Mg Tabs (Trazodone Hcl) .... 2 By Mouth At Bedtime  Allergies (verified): 1)  ! 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 Hysterectomy Back surgery Cholecystectomy w/drain placment and removal  Family History: Reviewed history from 07/28/2010 and no changes required. No FH of Colon Cancer: Family History of Heart Disease: Father, Sister, Grandparents Uterine Cancer-daughter Family History of Liver Disease: 2 brothers with hepatitis C Cervical Cancer: Sister  Social History: Reviewed history from 06/23/2010 and no changes required. Married   2 girls Patient currently smokes. 1 PPD Alcohol Use - no Daily Caffeine Use  4 per day Illicit Drug Use - no  Review of Systems       The patient complains of arthritis/joint pain, back pain, and fatigue.  The patient denies allergy/sinus, anemia, anxiety-new, blood in urine, breast changes/lumps, change in vision, confusion, cough, coughing up blood, depression-new, fainting, fever, headaches-new, hearing problems, heart murmur, heart rhythm changes, itching, menstrual pain, muscle pains/cramps, night sweats, nosebleeds, pregnancy symptoms, shortness of breath, skin  rash, sleeping problems, sore throat, swelling of feet/legs, swollen lymph glands, thirst - excessive ,  urination - excessive , urination changes/pain, urine leakage, vision changes, and voice change.         Pertinent positive and negative review of systems were noted in the above HPI. All other ROS was otherwise negative.   Vital Signs:  Patient profile:   65 year old female Height:      69 inches Weight:      178 pounds BMI:     26.38 BSA:     1.97 Pulse rate:   88 / minute Pulse rhythm:   regular BP sitting:   100 / 62  (left arm) Cuff size:   regular  Vitals Entered By: Ok Anis CMA (August 03, 2010 3:41 PM)  Physical Exam  General:  Alert and oriented. Smells of smoke. Mouth:  No deformity or lesions, dentition normal. Neck:  Supple; no masses or thyromegaly. Lungs:  Clear throughout to auscultation. Heart:  Regular rate and rhythm; no murmurs, rubs,  or bruits. Abdomen:  protuberant abdomen in standing position which is reduced when she lays down. There is some voluntary guarding but when she is distracted abdomen becomes quite flat and relaxed. Bowel sounds are normoactive. There is mild tenderness diffusely in all quadrants but mostly in the left lower quadrant and in the epigastrium, nor ascites, liver edge at costal margin. No CVA tenderness. Rectal:  stool is soft and Hemoccult-negative. Extremities:  No clubbing, cyanosis, edema or deformities noted. Skin:  Intact without significant lesions or rashes. Psych:  Alert and cooperative. Normal mood and affect.   Impression & Recommendations:  Problem # 1:  ABDOMINAL PAIN, GENERALIZED (ICD-789.07) Patient has diffuse abdominal discomfort due to abdominal protuberance and bloating. This is likely related to irritable bowel syndrome and COPD. I suspect she has an  aerophagia. I also suspect pulmonary hyperinflation causing flattening of the diaphragm resulting in abdominal protuberance. She has poor muscle tone in her abdominal muscles. There is no ventral hernia. She is having lrefractory reflux and is at high risk for  Barrett's esophagus. We will proceed with an upper endoscopy. I asked her to increase her Prilosec to 20 mg twice a day.  Problem # 2:  DIVERTICULOSIS, COLON (ICD-562.10) Patient has bloating in the setting of a recent normal colonoscopy with the exception of diverticulosis. She is to start of Librax one p.o. b.i.d.  Other Orders: EGD (EGD)  Patient Instructions: 1)  You have been scheduled for an endoscopy. Please follow written prep instructions that were given to you today at your visit. 2)  Increase your Prilosec to two times a day dosing 3)  We have sent an electronic prescription of Librax to your pharmacy for you to take once every morning. 4)  Copy sent to : Cassell Clement, MD 5)  The medication list was reviewed and reconciled.  All changed / newly prescribed medications were explained.  A complete medication list was provided to the patient / caregiver. Prescriptions: LIBRAX 2.5-5 MG CAPS (CLIDINIUM-CHLORDIAZEPOXIDE) Take 1 capsule by mouth every morning  #30 x 1   Entered by:   Lamona Curl CMA (AAMA)   Authorized by:   Hart Carwin MD   Signed by:   Lamona Curl CMA (AAMA) on 08/03/2010   Method used:   Electronically to        [facility] [address] [id number]* (retail)       [address]  [place], [place]  [address]       Ph: [phone]       Fax: [phone]   RxID:   [id number] PRILOSEC 20 MG CPDR (OMEPRAZOLE) Take 1 tablet by mouth two times a day  #60 x 2   Entered by:   Lamona Curl CMA (AAMA)   Authorized by:   Hart Carwin MD   Signed by:   Lamona Curl CMA (AAMA) on 08/03/2010   Method used:   Electronically to        [id number]* (retail)       [address] [place], [place]  [address]       Ph: [phone]       Fax: [phone]   RxID:   [id number]

## 2010-11-07 NOTE — Procedures (Signed)
Summary: Upper Endoscopy  Patient: Naveyah Iacovelli Note: All result statuses are Final unless otherwise noted.  Tests: (1) Upper Endoscopy (EGD)   EGD Upper Endoscopy       DONE     [facility]     [address]     [place], [place]  [address]          ENDOSCOPY PROCEDURE REPORT          PATIENT:  Jordis, Repetto  MR#:  [id number]     BIRTHDATE:  05-Sep-1945, 64 yrs. old  GENDER:  female          ENDOSCOPIST:  Hedwig Morton. Juanda Chance, MD     Referred by:  Cassell Clement, M.D.          PROCEDURE DATE:  08/29/2010     PROCEDURE:  EGD with biopsy, 43239, Maloney Dilation of Esophagus     ASA CLASS:  Class II     INDICATIONS:  GERD, heartburn imroved since Prelosec increased to     bid          MEDICATIONS:   Versed 5 mg, Fentanyl 50 mcg     TOPICAL ANESTHETIC:  Exactacain Spray          DESCRIPTION OF PROCEDURE:   After the risks benefits and     alternatives of the procedure were thoroughly explained, informed     consent was obtained.  [facility] GIF-H180 [id number] endoscope was     introduced through the mouth and advanced to the second portion of     the duodenum, without limitations.  The instrument was slowly     withdrawn as the mucosa was fully examined.     <<PROCEDUREIMAGES>>          A hiatal hernia was found (see image1). 1-2 cm hiatal hernia,     reducible  A stricture was found in the distal esophagus (see     image2, image3, and image8). nonobstructing stricture maloney     dilator 64F passed without difficulty  Otherwise the examination     was normal. With standard forceps, a biopsy was obtained and sent     to pathology. antrum, r/o H (see image5 and image6).pylori     Retroflexed views revealed no abnormalities.    The scope was then     withdrawn from the patient and the procedure completed.          COMPLICATIONS:  None          ENDOSCOPIC IMPRESSION:     1) Hiatal hernia     2) Stricture in the distal esophagus     3) Otherwise normal examination    s/p passage of 64F maloney dilator     RECOMMENDATIONS:     1) Anti-reflux regimen to be follow     2) Await biopsy results     continue Prelosec 20mg  po bid          REPEAT EXAM:  In 0 year(s) for.          ______________________________     Hedwig Morton. Juanda Chance, MD          CC:  Cassell Clement, M.D.          n.     eSIGNED:   Hedwig Morton. Sareena Odeh at 08/29/2010 05:06 PM          [place]annie, [place] A[place], [id number]  Note: An exclamation mark (!) indicates a result that was not dispersed into the flowsheet. Document  Creation Date: 08/29/2010 5:06 PM _______________________________________________________________________  (1) Order result status: Final Collection or observation date-time: 08/29/2010 16:57 Requested date-time:  Receipt date-time:  Reported date-time:  Referring Physician:   Ordering Physician: Lina Sar [phone]) Specimen Source:  Source: Launa Grill Order Number: [id number] Lab site:

## 2010-11-22 LAB — CBC
HCT: 33.4 % — ABNORMAL LOW (ref 36.0–46.0)
HCT: 37.4 % (ref 36.0–46.0)
Hemoglobin: 11.4 g/dL — ABNORMAL LOW (ref 12.0–15.0)
MCHC: 34.1 g/dL (ref 30.0–36.0)
MCV: 96.9 fL (ref 78.0–100.0)
MCV: 97.7 fL (ref 78.0–100.0)
Platelets: 258 10*3/uL (ref 150–400)
RBC: 3.42 MIL/uL — ABNORMAL LOW (ref 3.87–5.11)
RDW: 12.6 % (ref 11.5–15.5)
RDW: 12.6 % (ref 11.5–15.5)
WBC: 7.3 10*3/uL (ref 4.0–10.5)

## 2010-11-22 LAB — DIFFERENTIAL
Basophils Absolute: 0.1 10*3/uL (ref 0.0–0.1)
Eosinophils Relative: 3 % (ref 0–5)
Lymphocytes Relative: 26 % (ref 12–46)
Lymphs Abs: 1.9 10*3/uL (ref 0.7–4.0)
Monocytes Absolute: 0.8 10*3/uL (ref 0.1–1.0)
Monocytes Relative: 10 % (ref 3–12)
Neutro Abs: 4.3 10*3/uL (ref 1.7–7.7)

## 2010-11-22 LAB — COMPREHENSIVE METABOLIC PANEL
ALT: 18 U/L (ref 0–35)
AST: 19 U/L (ref 0–37)
Albumin: 2.8 g/dL — ABNORMAL LOW (ref 3.5–5.2)
BUN: 3 mg/dL — ABNORMAL LOW (ref 6–23)
BUN: 7 mg/dL (ref 6–23)
CO2: 31 mEq/L (ref 19–32)
Calcium: 9 mg/dL (ref 8.4–10.5)
Calcium: 9.1 mg/dL (ref 8.4–10.5)
Chloride: 104 mEq/L (ref 96–112)
Creatinine, Ser: 0.86 mg/dL (ref 0.4–1.2)
GFR calc Af Amer: >60 mL/min (ref >60)
GFR calc non Af Amer: >60 mL/min (ref >60)
Glucose, Bld: 83 mg/dL (ref 70–99)
Sodium: 141 mEq/L (ref 135–145)
Total Protein: 6 g/dL (ref 6.0–8.3)
Total Protein: 6.4 g/dL (ref 6.0–8.3)

## 2010-11-23 LAB — COMPREHENSIVE METABOLIC PANEL
ALT: 10 U/L (ref 0–35)
AST: 14 U/L (ref 0–37)
AST: 16 U/L (ref 0–37)
Albumin: 3.1 g/dL — ABNORMAL LOW (ref 3.5–5.2)
Albumin: 2.9 g/dL — ABNORMAL LOW (ref 3.5–5.2)
Albumin: 3 g/dL — ABNORMAL LOW (ref 3.5–5.2)
Alkaline Phosphatase: 47 U/L (ref 39–117)
Alkaline Phosphatase: 34 U/L — ABNORMAL LOW (ref 39–117)
Alkaline Phosphatase: 42 U/L (ref 39–117)
BUN: 13 mg/dL (ref 6–23)
BUN: 4 mg/dL — ABNORMAL LOW (ref 6–23)
BUN: 7 mg/dL (ref 6–23)
CO2: 27 mEq/L (ref 19–32)
CO2: 28 mEq/L (ref 19–32)
Chloride: 98 mEq/L (ref 96–112)
Chloride: 105 mEq/L (ref 96–112)
Chloride: 106 mEq/L (ref 96–112)
Chloride: 109 mEq/L (ref 96–112)
Creatinine, Ser: 0.74 mg/dL (ref 0.4–1.2)
Creatinine, Ser: 0.82 mg/dL (ref 0.4–1.2)
GFR calc Af Amer: >60 mL/min (ref >60)
GFR calc Af Amer: >60 mL/min (ref >60)
GFR calc non Af Amer: 54 mL/min — ABNORMAL LOW (ref >60)
GFR calc non Af Amer: >60 mL/min (ref >60)
GFR calc non Af Amer: >60 mL/min (ref >60)
Glucose, Bld: 104 mg/dL — ABNORMAL HIGH (ref 70–99)
Glucose, Bld: 129 mg/dL — ABNORMAL HIGH (ref 70–99)
Glucose, Bld: 96 mg/dL (ref 70–99)
Potassium: 4.1 mEq/L (ref 3.5–5.1)
Potassium: 3.6 mEq/L (ref 3.5–5.1)
Potassium: 3.6 mEq/L (ref 3.5–5.1)
Sodium: 138 mEq/L (ref 135–145)
Total Bilirubin: 1 mg/dL (ref 0.3–1.2)
Total Bilirubin: 0.4 mg/dL (ref 0.3–1.2)
Total Bilirubin: 0.8 mg/dL (ref 0.3–1.2)

## 2010-11-23 LAB — POCT I-STAT, CHEM 8
BUN: 13 mg/dL (ref 6–23)
Calcium, Ion: 1.14 mmol/L (ref 1.12–1.32)
Chloride: 105 mEq/L (ref 96–112)
Creatinine, Ser: 0.7 mg/dL (ref 0.4–1.2)
Glucose, Bld: 162 mg/dL — ABNORMAL HIGH (ref 70–99)

## 2010-11-23 LAB — CARDIAC PANEL(CRET KIN+CKTOT+MB+TROPI)
CK, MB: 1.3 ng/mL (ref 0.3–4.0)
CK, MB: 1.9 ng/mL (ref 0.3–4.0)
CK, MB: 4.9 ng/mL — ABNORMAL HIGH (ref 0.3–4.0)
Relative Index: 4.8 — ABNORMAL HIGH (ref 0.0–2.5)
Total CK: 70 U/L (ref 7–177)
Troponin I: 0.85 ng/mL (ref 0.00–0.06)
Troponin I: 0.95 ng/mL (ref 0.00–0.06)
Troponin I: 1.08 ng/mL (ref 0.00–0.06)
Troponin I: 1.5 ng/mL (ref 0.00–0.06)

## 2010-11-23 LAB — CBC
HCT: 42.2 % (ref 36.0–46.0)
HCT: 34.4 % — ABNORMAL LOW (ref 36.0–46.0)
HCT: 39.3 % (ref 36.0–46.0)
Hemoglobin: 11.8 g/dL — ABNORMAL LOW (ref 12.0–15.0)
Hemoglobin: 11.8 g/dL — ABNORMAL LOW (ref 12.0–15.0)
MCHC: 34 g/dL (ref 30.0–36.0)
MCV: 97.6 fL (ref 78.0–100.0)
MCV: 98.6 fL (ref 78.0–100.0)
Platelets: 172 10*3/uL (ref 150–400)
Platelets: 155 10*3/uL (ref 150–400)
Platelets: 186 10*3/uL (ref 150–400)
RBC: 4.32 MIL/uL (ref 3.87–5.11)
RBC: 3.53 MIL/uL — ABNORMAL LOW (ref 3.87–5.11)
RBC: 3.98 MIL/uL (ref 3.87–5.11)
RDW: 13.1 % (ref 11.5–15.5)
WBC: 13.6 10*3/uL — ABNORMAL HIGH (ref 4.0–10.5)
WBC: 5 10*3/uL (ref 4.0–10.5)
WBC: 5.7 10*3/uL (ref 4.0–10.5)

## 2010-11-23 LAB — DIFFERENTIAL
Eosinophils Relative: 1 % (ref 0–5)
Lymphocytes Relative: 13 % (ref 12–46)
Lymphs Abs: 1.7 10*3/uL (ref 0.7–4.0)
Monocytes Absolute: 1.2 10*3/uL — ABNORMAL HIGH (ref 0.1–1.0)

## 2010-11-23 LAB — BODY FLUID CULTURE

## 2010-11-23 LAB — ANAEROBIC CULTURE

## 2010-11-23 LAB — HEPATIC FUNCTION PANEL
ALT: 18 U/L (ref 0–35)
Alkaline Phosphatase: 50 U/L (ref 39–117)
Bilirubin, Direct: 0.1 mg/dL (ref 0.0–0.3)
Indirect Bilirubin: 0.5 mg/dL (ref 0.3–0.9)

## 2010-11-23 LAB — PROTIME-INR: Prothrombin Time: 13.8 seconds (ref 11.6–15.2)

## 2010-11-23 LAB — D-DIMER, QUANTITATIVE: D-Dimer, Quant: 1.33 ug/mL-FEU — ABNORMAL HIGH (ref 0.00–0.48)

## 2010-12-12 ENCOUNTER — Other Ambulatory Visit: Payer: Self-pay | Admitting: Internal Medicine

## 2011-01-05 NOTE — Discharge Summary (Signed)
[place]  Patient:    Traci [name], Traci [name]                       MRN: [id number] Adm. Date:  [id number] Disc. Date: [id number] Attending:  Rudean Hitt CC:         Thomas A. Patty Sermons, M.D.   Discharge Summary  DISCHARGE DIAGNOSES: 1. Noncardiac chest pain. 2. Hypertension. 3. Anxiety. 4. Cigarette abuse.  DISCHARGE MEDICATIONS: 1. Atenolol 25 mg a day. 2. Premarin 2.5 mg a day. 3. Alprazolam 0.25 mg a day. 4. Enteric-coated aspirin 325 mg a day.  DISPOSITION:  The patient is to eat a low-fat, low-cholesterol diet.  She was instructed to stop smoking.  She is to see Dr. Patty Sermons in one to two weeks.  HISTORY:  Ms. Asberry is a 65 year old female with a history of chest pain.  She has a history of a negative heart catheterization in 1996.  She has continued to smoke cigarettes.  For the past three days, she has had worsening intermittent chest pains.  Please see dictated H&P for further details.  HOSPITAL COURSE:  CHEST PAIN:  The patient ruled out for myocardial infarction with serial CPKs. Her EKG remained unchanged.  The following day, she had a stress Cardiolite study during which she walked for 8 minutes.  She did not have any ST changes. The Cardiolite images were negative.  She had no evidence of ischemia.  She had a normal left ventricular systolic function with an EF of 74%.  The patient was discharged from the hospital following the Cardiolite study.  She will follow up with Dr. Patty Sermons.  All of her other medical problems are stable. DD:  04/05/00 TD:  04/06/00 Job: [id number] [id number]

## 2011-01-05 NOTE — Op Note (Signed)
Traci [name], Traci [name]                ACCOUNT NO.:  [id number]   MEDICAL RECORD NO.:  [id number]          PATIENT TYPE:  OIB   LOCATION:  2899                         FACILITY:  [facility]   PHYSICIAN:  Donalee Citrin, M.D.        DATE OF BIRTH:  01-14-1946   DATE OF PROCEDURE:  01/05/2005  DATE OF DISCHARGE:                                 OPERATIVE REPORT   PREOPERATIVE DIAGNOSIS:  Right L5 and S1 radiculopathies, and ruptured disc  L5-S1, right.   PROCEDURE:  Lumbar laminectomy and microdiskectomy L5-S1, right, with  microscopic dissection of the right S1 nerve root, and microscopic  diskectomy of free fragment underneath the right L5 nerve root.   SURGEON:  Donalee Citrin, M.D.   ASSISTANT:  Tia Alert, M.D.   ANESTHESIA:  General endotracheal.   HISTORY OF PRESENT ILLNESS:  The patient is a very pleasant 65 year old  female who has had back, abdominal, and right leg pain radiating down to the  top of the foot and the outside of the foot, numbness and tingling in the  same distribution.  The patient failed all forms of conservative treatment.  __________ showed a large ruptured disc with free fragment migrating  cephalad behind the L5 body underneath the L5 pedicle, compressing the L5  and the proximal S1 root.  The patient was recommended laminectomy and  microdiskectomy, and the risks and benefits or surgery were explained to the  patient who understands and agrees to proceed forward.   PROCEDURE IN DETAIL:  The patient is brought to the O.R. and was induced  with general anesthesia, and positioned prone on the Wilson frame.  Back was  prepped and draped in the usual sterile fashion.  Preop x-ray localized the  L4-5 disc space.  After infiltration with 10 cc lidocaine with epinephrine,  a midline incision was made, and Bovie electrocautery was used to dissect  down through subcutaneous tissue.  Subperiosteal dissection was carried down  to the  lamina of L5 and S1 on the right.   Virtually the entire L5 lamina  was removed in a piecemeal fashion with 2 and 3 mm Kerrison punches as well  as the medial facet complex and the superior aspect of the lamina of S1.  This exposed the ligamentum flavum, which was removed in a piecemeal  fashion, exposing the thecal sac.  Then, the operating microscope was  brought into the field.  With microscopic illumination, the remainder of the  lateral gutter was underbitten, and the lateral ligament was underbitten,  exposing the proximal S1 nerve root.  This was dissected off of the disc  space.  The disc space itself was noted to be flat.  Epidural veins  coagulated, and attention was taken cephalad, and the pedicle was palpated,  and there was noted to be a very large free fragment of disc right  underneath the pedicle.  This was pushed away with a blunt nerve hook and a  coronary dilator, and several large fragments were removed from underneath  the L5 pedicle.  The L5 neural foramen was explored with  a blunt nerve hook  and noted to be widely patent.  After removal of this fragment, the disc  space was again reevaluated and felt to be flattened, with no evidence of  annular rent, and so it was decided not to incise the disc space at this  time.  The wound  was copiously irrigated.  Meticulous hemostasis was maintained.  Gelfoam was  overlaid on top of the dura, and muscle fascia reapproximated with 0  interrupted Vicryl, the subcutaneous tissues were closed with 2-0  interrupted  Vicryl, and the skin was closed with a running 4-0 subcuticular.  Benzoin  and Steri-Strips were applied.  The patient went to the recovery room in  stable condition.  At the end of the case, the needle counts and sponge  counts were correct.      GC/MEDQ  D:  01/05/2005  T:  01/05/2005  Job:  [id number]

## 2011-01-05 NOTE — Procedures (Signed)
[facility]  Patient:    Traci [name], Traci [name]                       MRN: [id number] Adm. Date:  [id number] Disc. Date: [id number] Attending:  Mervin Hack CC:         Malachi Pro. Ambrose Mantle, M.D.   Procedure Report  NAME OF PROCEDURE:  Upper endoscopy.  INDICATION:  This 65 year old [place] female smoker has complained of early satiety, epigastric pain, abdominal distention and bloating.  She has vomiting on occasion.  Her weight has increased about 15 to 20 pounds.  She takes anti-inflammatory agents, Vioxx and prior to the Celebrex.  She has also been on aspirin 81 mg a day.  She is on Zoloft.  The patient was started on Protonix 40 mg a day approximately two weeks ago with essentially no improvement of her abdominal symptoms.  Ultrasound of the gallbladder is pending.  She is undergoing upper endoscopy for evaluation of her epigastric pain and other GI symptoms.  ENDOSCOPE:  Olympus single-channel video endoscope.  SEDATION:  Versed 9 mg IV, Demerol 25 mg IV.  FINDINGS:  Esophagus:  Olympus single-channel video endoscope passed under direct vision through posterior pharynx into the esophagus.  The patient was monitored by pulse oximetry.  Her oxygen saturations were satisfactory.  Her gag reflex was not present.  Proximate and mid esophagus was normal.  There was a muscular ring at the distal esophagus, proximal to the GE junction.  The GE junction was mildly fibrotic, but there was no stricture.  There were no stricture.  There was no erosions.  Distal to the Z-line was a 3 cm hiatal hernia which was not reducible.  Stomach:  The stomach was insufflated with air and again confirmed presence of hiatal hernia.  Mucosa in the hiatal hernia was normal.  There were no inflammatory changes.  Gastric body and gastric antrum was unremarkable as well as pyloric outlet.  Biopsies were taken from gastric antrum for Clo-test.  Retroflexion of endoscope revealed  normal fundus and cardia.  Duodenum:  The duodenal bulb and descending duodenum was normal.  IMPRESSION: 1. 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 2. Hiatal hernia. 3. Clo-test  PLAN: 1. Increase Protonix to 40 mg p.o. b.i.d. 2. Await results of the ultrasound.  If negative, proceed with CT    scan of the abdomen and pelvis to rule out intra-abdominal process. 3. Stop smoking. 4. Hold aspirin and Vioxx for now. DD:  05/31/00 TD:  05/31/00 Job: [id number]

## 2011-01-05 NOTE — Procedures (Signed)
[facility]  Patient:    Traci [name], Traci [name]                       MRN: [id number] Adm. Date:  [id number] Attending:  Mervin Hack CC:         Clovis Pu. Patty Sermons, M.D.   Procedure Report  PROCEDURE:  Colonoscopy.  ENDOSCOPIST:  Hedwig Morton. Juanda Chance, M.D.  INDICATIONS:  This 65 year old [place] female smoker has complaint of diffuse abdominal tenderness and abdominal pain.  She has been tried on an irritable bowel regimen.  CT scan of the abdomen showed a large left adrenal consistent with adrenal adenoma.  CT scan was repeated three months later with no significant change in the appearance of the left adrenal gland.  Because of the abdominal distention and complaints by the patient of continued rectal bleeding, ______ stools and black diarrhea, she is undergoing colonoscopy.  ENDOSCOPE:  Olympus single-channel videoscope.  SEDATION:  Versed 10 mg IV, Demerol 50 mg IV.  FINDINGS:  Olympus single-channel videoscope was passed directly in through the rectum to the sigmoid colon.  Patient was monitored by pulse oximetry; her oxygen saturation were 97-98% on 2 L of nasal O2.  Anal canal showed essentially normal rectal mucosa.  In the rectal ampulla, there were two small polyps which were diminutive, measuring 4 to 5 mm in diameter.  They were sessile and soft.  They were ablated with hot biopsy and sent to pathology. Sigmoid colon was rather tortuous with large haustral folds but no diverticula were seen.  Patient was comfortable throughout the procedure.  Splenic flexure, transverse colon and hepatic flexure were unremarkable and there was a normal ascending colon.  In the cecal pouch, there was another diminutive polyp measuring about 5 mm in diameter.  It was flat and sessile.  It was also ablated with hot biopsy and sent to pathology in a separate specimen bottle. Ileocecal valve itself was normal.  Video-photographs were obtained. Colonoscope was  then retracted and the colon decompressed.  Patient tolerated procedure well.  IMPRESSION 1. Diminutive polyps of the left and right colon, status post polypectomies. 2. No explanation for patients abdominal pain.  PLAN:  I believe the patient has an irritable bowel syndrome and will be treated accordingly with antispasmodics and fiber supplements and also possible tranquilizers or even dietary modification.  She was advised to stop smoking. DD:  09/24/00 TD:  09/25/00 Job: [id number]

## 2011-01-05 NOTE — Consult Note (Signed)
NAMEQUINNLYN, HEARNS NO.:  [id number]   MEDICAL RECORD NO.:  [id number]          PATIENT TYPE:  EMS   LOCATION:  MAJO                         FACILITY:  [facility]   PHYSICIAN:  Donalee Citrin, M.D.        DATE OF BIRTH:  08/18/1946   DATE OF CONSULTATION:  12/14/2004  DATE OF DISCHARGE:  12/14/2004                                   CONSULTATION   REFERRING PHYSICIAN:  Trudi Ida. Denton Lank, M.D.   REASON FOR CONSULTATION:  Back and right leg pain.   HISTORY OF PRESENT ILLNESS:  The patient is a very pleasant 65 year old  female with about a month of progressively worsening back and predominantly  right buttock and posterior right leg pain radiating down to the top of the  foot.  She has numbness in her right ankle.  She denies any left leg pain.  She denies any bladder problems.  She did have 1 episode of bowel  incontinence that happened this morning; however, she was on her way to  shower; it is unclear what the etiology of this was.  It is aggravated by  all forms of activity.  She had been to the emergency room a couple of times  for this and she presented to [facility] today and has been referred here for  emergent evaluation with MRI scan.   PAST MEDICAL HISTORY:  Past medical history is remarkable for having  previous MI for which she is followed by Dr. Patty Sermons.  She also is a  smoker.   MEDICATIONS:  She is currently on only atenolol for medications.   PHYSICAL EXAM:  GENERAL:  Her physical exam reveals the patient is a very  pleasant, awake and alert 65 year old female in no acute distress.  HEENT:  Within normal limits.  NEUROLOGIC:  Upper strength is 5/5, lower extremity strength is 5/5 in the  iliopsoas, quads, hamstrings, gastrocnemii and EHL.  She does have some  weakness on plantarflexion on toe walk, but no evidence of a foot drop.  She  has decreased ankle jerk.  She has normal knee jerks.  She has normal  sensation.  She has no evidence of saddle  anesthesia.  RECTAL:  She deferred the rectal exam, which had been done at the outside  facility, raising some question; however, she did not want this repeated.   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  I do not  think that it is large enough to have caused a cauda equina syndrome and she  does not have any evidence of urinary retention; postvoid residual checked  in the emergency room was negative; it was 0 as a matter of fact.  She has  again normal sensation and no saddle anesthesia.  She has just numbness and  tingling in an S1 nerve root distribution and an L5 nerve root  distribution.  She is ambulatory.  I have recommended we give her a shot of 80 mg of Depo-  Medrol in the emergency room, send her home on a Medrol Dosepak as well as  Percocet and Valium.  We will get cardiac  clearance from Dr. Patty Sermons.  I do think that this will require limited  microdissection that can be done on an elective fashion pending cardiac  clearance.  I also explained the risks of this with the patient __________  cause of any additional bowel and bladder problems ensue or the pain gets  progressively worse and does not respond to steroids.      GC/MEDQ  D:  12/14/2004  T:  12/15/2004  Job:  [id number]

## 2011-01-05 NOTE — H&P (Signed)
[facility]  Patient:    Traci [name], Traci [name]                         MRN: [id number] Attending:  Alvia Grove., M.D. CC:         Ronny Flurry, M.D., Cardiology             [facility]                         History and Physical  HISTORY:  Traci [name] is a 65 year old female with a history of chest pain.  She s admitted to through the emergency room with another episode of chest pain.  The patient has a long history of chest pain.  She had a heart catheterization ack in 1996, which revealed normal coronary arteries.  She has continued to smoke since that time. She has continued to have angina-like chest pain and had a negative stress echocardiogram in 4/00.  Starting two days ago, she had substernal chest  pain and thought that Traci [name] chest would explode.  The chest pain was associated with some severe dyspnea and radiated to the left arm and under Traci [name] left arm.  The pain lasted 20 minutes, but recurred several hours later.  The pain was subsequently  relieved sublingual nitroglycerin.  The patient had some recurrent pain today and she presents to the emergency room.  CURRENT MEDICATIONS: 1. Atenolol 25 mg a day. 2. Zoloft 100 mg a day. 3. Premarin 2.5 mg a day. 4. Alprazolam 0.25 mg q. h.s. 5. Enteric coated aspirin.  ALLERGIES:  No known drug allergies.  PAST MEDICAL HISTORY: 1. Chest pain. 2. Anxiety.  SOCIAL HISTORY: Patient smokes about a pack a day.  She does not drink alcohol.   FAMILY HISTORY:  Positive for coronary artery disease in Traci [name] father and Traci [name] sister.  REVIEW OF SYSTEMS:  She has had some dyspnea and fatigue.  Otherwise, Traci [name] review of systems was reviewed and is essentially negative.  PHYSICAL EXAMINATION:   She is a middle-aged [place] female in no acute distress.   Vital signs:  Heart rate 70, blood pressure 125/77.  HEENT:  2+ carotids, but no bruits.  NECK:  No thyromegaly.  LUNGS:  Clear  to auscultation.  BACK:  Nontender.  HEART:  Regular rate with S1, S2 with no murmurs, rubs, or gallops.  ABDOMEN:  Good bowels sounds and is nontender.  No hepatosplenomegaly.  EXTREMITIES:  2+ pulses.  Traci [name] calves are nontender.  She has no clubbing, cyanosis, or edema.  NEUROLOGIC:  Cranial nerves II-XII and Traci [name] motor and sensory function are intact. Traci [name] gait was not assessed.  EKG reveals normal sinus rhythm.  Traci [name] ventricular rate is 74.  There are no ST r T wave changes.  LABORATORY DATA:  Cardiac enzymes are negative.  Chem-7 revealed sodium 140, potassium 3.7, chloride 105, BUN 10.  Hemoglobin 14, hematocrit 42.  pH 7.42.  ASSESSMENT:  Ms. Boster presents with an episode of chest pain.  The chest pain as been intermittent for the past several days and Traci [name] enzymes are negative for so  far.  EKG is nonacute.  We will admit Traci [name] for to rule out myocardial infarction. We will collect cardiac enzymes.  We will do a stress Cardiolite study in the morning.  I have counseled the patient regarding Traci [name] smoking.  We will continue with the above noted medications  and also start Traci [name] on Prilosec. DD:  02/17/00 TD:  02/17/00 Job: [id number]

## 2011-02-14 ENCOUNTER — Other Ambulatory Visit: Payer: Self-pay | Admitting: Internal Medicine

## 2011-02-14 NOTE — Telephone Encounter (Signed)
Pharmacy states that they never got refill authorization sent on 12/10/10 for #180 with 1 refill. Gave verbal okay for patient to get #180 to pharmacist.

## 2011-02-16 ENCOUNTER — Other Ambulatory Visit: Payer: Self-pay | Admitting: Cardiology

## 2011-02-16 NOTE — Telephone Encounter (Signed)
Med refill

## 2011-02-22 ENCOUNTER — Telehealth: Payer: Self-pay | Admitting: Cardiology

## 2011-02-22 DIAGNOSIS — E7800 Pure hypercholesterolemia, unspecified: Secondary | ICD-10-CM

## 2011-02-22 DIAGNOSIS — Z79899 Other long term (current) drug therapy: Secondary | ICD-10-CM

## 2011-02-22 NOTE — Telephone Encounter (Signed)
Called in to make an appointment for a check-up. Please call back. I have pulled her chart.

## 2011-03-23 ENCOUNTER — Encounter: Payer: Self-pay | Admitting: Cardiology

## 2011-03-28 ENCOUNTER — Ambulatory Visit (INDEPENDENT_AMBULATORY_CARE_PROVIDER_SITE_OTHER): Payer: Medicare Other | Admitting: Cardiology

## 2011-03-28 ENCOUNTER — Other Ambulatory Visit (INDEPENDENT_AMBULATORY_CARE_PROVIDER_SITE_OTHER): Payer: Medicare Other | Admitting: *Deleted

## 2011-03-28 ENCOUNTER — Encounter: Payer: Self-pay | Admitting: Cardiology

## 2011-03-28 VITALS — BP 100/70 | HR 67 | Ht 69.0 in | Wt 179.0 lb

## 2011-03-28 DIAGNOSIS — E785 Hyperlipidemia, unspecified: Secondary | ICD-10-CM

## 2011-03-28 DIAGNOSIS — Z79899 Other long term (current) drug therapy: Secondary | ICD-10-CM

## 2011-03-28 DIAGNOSIS — I252 Old myocardial infarction: Secondary | ICD-10-CM

## 2011-03-28 DIAGNOSIS — E7800 Pure hypercholesterolemia, unspecified: Secondary | ICD-10-CM

## 2011-03-28 LAB — HEPATIC FUNCTION PANEL
ALT: 19 U/L (ref 0–35)
Alkaline Phosphatase: 51 U/L (ref 39–117)
Bilirubin, Direct: 0 mg/dL (ref 0.0–0.3)
Total Bilirubin: 0.4 mg/dL (ref 0.3–1.2)
Total Protein: 6.6 g/dL (ref 6.0–8.3)

## 2011-03-28 LAB — BASIC METABOLIC PANEL
BUN: 11 mg/dL (ref 6–23)
Chloride: 108 mEq/L (ref 96–112)
Creatinine, Ser: 1 mg/dL (ref 0.4–1.2)
GFR: 56.85 mL/min — ABNORMAL LOW (ref >60.00)
Glucose, Bld: 113 mg/dL — ABNORMAL HIGH (ref 70–99)
Potassium: 3.8 mEq/L (ref 3.5–5.1)

## 2011-03-28 LAB — LIPID PANEL
Cholesterol: 155 mg/dL (ref 0–200)
VLDL: 28 mg/dL (ref 0.0–40.0)

## 2011-03-28 NOTE — Progress Notes (Signed)
Traci [name] Date of Birth:  1945-11-23 [facility] [address]   [address] [place], [place]  [address] [phone]           Fax   [phone]  HPI: This pleasant 65 year old woman is seen for a six-month followup office visit she has a past history of palpitations and a history of hypercholesterolemia.  She's also had a history of anxiety and depression and is followed by a counselor in [employer].  The patient is presently on citalopram 40 mg daily.  She is also on Wellbutrin and trazodone.  She feels that she is doing well from a mental health standpoint on this regimen.  Patient has a history of hypercholesterolemia and has been on low dose lovastatin.  She's not having any myalgias or other side effects from the medication.  Current Outpatient Prescriptions  Medication Sig Dispense Refill  . atenolol (TENORMIN) 50 MG tablet Take 25 mg by mouth 2 (two) times daily.        Marland Kitchen buPROPion (WELLBUTRIN XL) 300 MG 24 hr tablet Take 300 mg by mouth daily.        . CHLORDIAZEPOXIDE HCL PO Take by mouth daily.        . citalopram (CELEXA) 20 MG tablet Take 20 mg by mouth daily.        Marland Kitchen lovastatin (MEVACOR) 20 MG tablet TAKE 1 TABLET EVERY DAY  30 tablet  11  . omeprazole (PRILOSEC) 20 MG capsule TAKE 1 CAPSULE BY MOUTH TWICE DAILY  180 capsule  0  . traZODone (DESYREL) 150 MG tablet Take 300 mg by mouth at bedtime.          Allergies  Allergen Reactions  . Cephalexin     Patient Active Problem List  Diagnoses  . BENIGN NEOPLASM OF ADRENAL GLAND  . HYPERLIPIDEMIA  . ANXIETY  . HYPERTENSION  . MYOCARDIAL INFARCTION, HX OF  . COPD  . ACUTE DILATATION OF STOMACH  . DIVERTICULOSIS, COLON  . FLATULENCE-GAS-BLOATING  . DIARRHEA  . CHANGE IN BOWELS  . ABDOMINAL PAIN, GENERALIZED  . COLONIC POLYPS, ADENOMATOUS, HX OF  . HEARTBURN    History  Smoking status  . Current Everyday Smoker -- 1.0 packs/day  Smokeless tobacco  . Not on file    History    Alcohol Use No    Family History  Problem Relation Age of Onset  . Heart attack Father     Review of Systems: The patient denies any heat or cold intolerance.  No weight gain or weight loss.  The patient denies headaches or blurry vision.  There is no cough or sputum production.  The patient denies dizziness.  There is no hematuria or hematochezia.  The patient denies any muscle aches or arthritis.  The patient denies any rash.  The patient denies frequent falling or instability.  There is no history of depression or anxiety.  All other systems were reviewed and are negative.   Physical Exam: Filed Vitals:   03/28/11 0942  BP: 100/70  Pulse: 67  On examination her general appearance reveals a well-developed well-nourished woman in no distress.Pupils equal and reactive.   Extraocular Movements are full.  There is no scleral icterus.  The mouth and pharynx are normal.  The neck is supple.  The carotids reveal no bruits.  The jugular venous pressure is normal.  The thyroid is not enlarged.  There is no lymphadenopathy.  The chest is clear to percussion and auscultation. There  are no rales or rhonchi. Expansion of the chest is symmetrical.  The precordium is quiet.  The first heart sound is normal.  The second heart sound is physiologically split.  There is no murmur gallop rub or click.  There is no abnormal lift or heave.  The abdomen is soft and nontender. Bowel sounds are normal. The liver and spleen are not enlarged. There Are no abdominal masses. There are no bruits.    Normal extremity without phlebitis or edema.  Normal neurologic.The skin is warm and dry.  There is no rash.    EKG today shows normal sinus rhythm.  There are no ischemic changes.  The QTc is slightly prolonged at 464 ms.    Assessment / Plan: Continue same medication.  Recheck in 6 months for followup office visit and fasting lab work.  We counseled her today about the importance of quitting smoking.  Presently  smokes a pack a day

## 2011-03-28 NOTE — Assessment & Plan Note (Signed)
The patient does  have a history of aNontransmural myocardial infarction.  She had a normal cardiac catheterization in 1996.  She had a normal nuclear stress test in July 2001.  Her ejection fraction was 70 portion percent and no ischemia.In October 2010 she was admitted with symptoms of cholelithiasis.  She had an extensive cardiac workup because of some chest pain and had a slight elevation in her troponins up to 1.5 and had a cardiac catheterization on 06/02/09 which showed only mild coronary artery disease and partially occluded obtuse marginal vessel that could have contributed to her increased troponin.  The patient has not been expressing any subsequent chest pain to suggest angina pectoris and her electrocardiogram today shows no ischemic changes.

## 2011-03-28 NOTE — Assessment & Plan Note (Addendum)
The patient has a history of hypercholesterolemia.  We're checking blood work today.  She is trying to watch her diet.  She has not been on any problems from the lovastatin in terms of myalgias.

## 2011-03-29 ENCOUNTER — Encounter: Payer: Self-pay | Admitting: Cardiology

## 2011-03-29 ENCOUNTER — Other Ambulatory Visit: Payer: Self-pay | Admitting: *Deleted

## 2011-03-29 ENCOUNTER — Telehealth: Payer: Self-pay | Admitting: *Deleted

## 2011-03-29 DIAGNOSIS — E785 Hyperlipidemia, unspecified: Secondary | ICD-10-CM

## 2011-03-29 NOTE — Telephone Encounter (Signed)
Advised of lab results 

## 2011-03-29 NOTE — Telephone Encounter (Signed)
Message copied by Burnell Blanks on Thu Mar 29, 2011  9:10 AM ------      Message from: Cassell Clement      Created: Thu Mar 29, 2011  8:47 AM       Pleasse report.  Cholesterol good. Liver and kidneys good.   BS sl high 113-- watch carbs, lose wght.

## 2011-03-29 NOTE — Progress Notes (Signed)
Advised of labs 

## 2011-04-19 ENCOUNTER — Other Ambulatory Visit: Payer: Self-pay | Admitting: Neurosurgery

## 2011-04-19 DIAGNOSIS — M545 Low back pain: Secondary | ICD-10-CM

## 2011-04-22 ENCOUNTER — Ambulatory Visit
Admission: RE | Admit: 2011-04-22 | Discharge: 2011-04-22 | Disposition: A | Discharge status: Home / Self Care | Payer: PRIVATE HEALTH INSURANCE | Source: Ambulatory Visit | Attending: Neurosurgery | Admitting: Neurosurgery

## 2011-04-22 DIAGNOSIS — M545 Low back pain: Secondary | ICD-10-CM

## 2011-05-02 ENCOUNTER — Other Ambulatory Visit: Payer: Self-pay | Admitting: Cardiology

## 2011-05-02 DIAGNOSIS — I119 Hypertensive heart disease without heart failure: Secondary | ICD-10-CM

## 2011-05-02 NOTE — Telephone Encounter (Signed)
escribe request  

## 2011-05-18 LAB — URINE MICROSCOPIC-ADD ON

## 2011-05-18 LAB — URINE CULTURE: Culture: NO GROWTH

## 2011-05-18 LAB — URINALYSIS, ROUTINE W REFLEX MICROSCOPIC
Bilirubin Urine: NEGATIVE
Glucose, UA: NEGATIVE
Specific Gravity, Urine: 1.04 — ABNORMAL HIGH
Urobilinogen, UA: 0.2

## 2011-05-18 LAB — POCT CARDIAC MARKERS
CKMB, poc: <1 — ABNORMAL LOW
Myoglobin, poc: 55.3

## 2011-05-18 LAB — POCT I-STAT, CHEM 8
Creatinine, Ser: 1.1
Hemoglobin: 13.3
Sodium: 139
TCO2: 30

## 2011-05-18 LAB — APTT: aPTT: 34

## 2011-05-18 LAB — CBC
Platelets: 176
RDW: 13.5

## 2011-05-18 LAB — DIFFERENTIAL
Basophils Absolute: 0.1
Lymphocytes Relative: 36
Neutro Abs: 3.6

## 2011-05-18 LAB — PROTIME-INR: Prothrombin Time: 14.2

## 2011-07-03 ENCOUNTER — Ambulatory Visit: Payer: Medicare Other | Admitting: Rehabilitation

## 2011-07-17 ENCOUNTER — Ambulatory Visit: Payer: Medicare Other | Attending: Neurosurgery | Admitting: Physical Therapy

## 2011-07-17 DIAGNOSIS — M2569 Stiffness of other specified joint, not elsewhere classified: Secondary | ICD-10-CM | POA: Insufficient documentation

## 2011-07-17 DIAGNOSIS — IMO0001 Reserved for inherently not codable concepts without codable children: Secondary | ICD-10-CM | POA: Insufficient documentation

## 2011-07-17 DIAGNOSIS — M545 Low back pain, unspecified: Secondary | ICD-10-CM | POA: Insufficient documentation

## 2011-07-19 ENCOUNTER — Ambulatory Visit: Payer: Medicare Other | Admitting: Physical Therapy

## 2011-07-24 ENCOUNTER — Ambulatory Visit: Payer: Medicare Other | Attending: Neurosurgery | Admitting: Physical Therapy

## 2011-07-24 DIAGNOSIS — M545 Low back pain, unspecified: Secondary | ICD-10-CM | POA: Insufficient documentation

## 2011-07-24 DIAGNOSIS — IMO0001 Reserved for inherently not codable concepts without codable children: Secondary | ICD-10-CM | POA: Insufficient documentation

## 2011-07-24 DIAGNOSIS — M2569 Stiffness of other specified joint, not elsewhere classified: Secondary | ICD-10-CM | POA: Insufficient documentation

## 2011-07-26 ENCOUNTER — Ambulatory Visit: Payer: Medicare Other | Admitting: Physical Therapy

## 2011-07-31 ENCOUNTER — Ambulatory Visit: Payer: Medicare Other | Admitting: Physical Therapy

## 2011-08-02 ENCOUNTER — Ambulatory Visit: Payer: Medicare Other | Admitting: Physical Therapy

## 2011-08-07 ENCOUNTER — Ambulatory Visit: Payer: Medicare Other | Admitting: Physical Therapy

## 2011-08-09 ENCOUNTER — Ambulatory Visit: Payer: Medicare Other | Admitting: Physical Therapy

## 2011-08-16 ENCOUNTER — Ambulatory Visit: Payer: Medicare Other | Admitting: Physical Therapy

## 2011-08-23 ENCOUNTER — Ambulatory Visit: Payer: Medicare Other | Attending: Neurosurgery | Admitting: Physical Therapy

## 2011-08-23 DIAGNOSIS — M545 Low back pain, unspecified: Secondary | ICD-10-CM | POA: Insufficient documentation

## 2011-08-23 DIAGNOSIS — IMO0001 Reserved for inherently not codable concepts without codable children: Secondary | ICD-10-CM | POA: Insufficient documentation

## 2011-08-23 DIAGNOSIS — M2569 Stiffness of other specified joint, not elsewhere classified: Secondary | ICD-10-CM | POA: Insufficient documentation

## 2011-08-28 ENCOUNTER — Ambulatory Visit: Payer: Medicare Other | Admitting: Physical Therapy

## 2011-08-30 ENCOUNTER — Ambulatory Visit: Payer: Medicare Other | Admitting: Physical Therapy

## 2011-09-04 ENCOUNTER — Ambulatory Visit: Payer: Medicare Other | Admitting: Physical Therapy

## 2011-09-07 ENCOUNTER — Ambulatory Visit: Payer: Medicare Other | Admitting: Physical Therapy

## 2011-09-12 ENCOUNTER — Ambulatory Visit: Payer: Medicare Other | Admitting: Physical Therapy

## 2011-09-14 ENCOUNTER — Encounter: Payer: Medicare Other | Admitting: Physical Therapy

## 2011-09-17 ENCOUNTER — Ambulatory Visit: Payer: Medicare Other | Admitting: Physical Therapy

## 2011-09-20 ENCOUNTER — Ambulatory Visit: Payer: Medicare Other | Admitting: Physical Therapy

## 2011-09-24 ENCOUNTER — Encounter: Payer: Self-pay | Admitting: Cardiology

## 2011-09-24 ENCOUNTER — Ambulatory Visit: Payer: Medicare Other | Admitting: Cardiology

## 2011-09-24 ENCOUNTER — Ambulatory Visit (INDEPENDENT_AMBULATORY_CARE_PROVIDER_SITE_OTHER): Payer: Medicare Other | Admitting: Cardiology

## 2011-09-24 ENCOUNTER — Other Ambulatory Visit: Payer: Medicare Other | Admitting: *Deleted

## 2011-09-24 VITALS — BP 114/71 | HR 69 | Ht 68.0 in | Wt 177.0 lb

## 2011-09-24 DIAGNOSIS — K3189 Other diseases of stomach and duodenum: Secondary | ICD-10-CM

## 2011-09-24 DIAGNOSIS — E785 Hyperlipidemia, unspecified: Secondary | ICD-10-CM

## 2011-09-24 DIAGNOSIS — M4800 Spinal stenosis, site unspecified: Secondary | ICD-10-CM

## 2011-09-24 DIAGNOSIS — J449 Chronic obstructive pulmonary disease, unspecified: Secondary | ICD-10-CM

## 2011-09-24 DIAGNOSIS — R002 Palpitations: Secondary | ICD-10-CM | POA: Insufficient documentation

## 2011-09-24 DIAGNOSIS — E7800 Pure hypercholesterolemia, unspecified: Secondary | ICD-10-CM

## 2011-09-24 DIAGNOSIS — I119 Hypertensive heart disease without heart failure: Secondary | ICD-10-CM

## 2011-09-24 DIAGNOSIS — J4489 Other specified chronic obstructive pulmonary disease: Secondary | ICD-10-CM

## 2011-09-24 DIAGNOSIS — R1013 Epigastric pain: Secondary | ICD-10-CM

## 2011-09-24 LAB — LIPID PANEL
HDL: 56.3 mg/dL (ref >39.00)
LDL Cholesterol: 66 mg/dL (ref 0–99)
Total CHOL/HDL Ratio: 2
VLDL: 17 mg/dL (ref 0.0–40.0)

## 2011-09-24 LAB — HEPATIC FUNCTION PANEL
Albumin: 3.7 g/dL (ref 3.5–5.2)
Total Bilirubin: 0.2 mg/dL — ABNORMAL LOW (ref 0.3–1.2)

## 2011-09-24 LAB — BASIC METABOLIC PANEL
CO2: 29 mEq/L (ref 19–32)
Chloride: 108 mEq/L (ref 96–112)
Glucose, Bld: 80 mg/dL (ref 70–99)
Potassium: 3.9 mEq/L (ref 3.5–5.1)
Sodium: 142 mEq/L (ref 135–145)

## 2011-09-24 MED ORDER — OMEPRAZOLE 20 MG PO CPDR: 20.0000 mg | DELAYED_RELEASE_CAPSULE | Freq: Every day | ORAL | Status: DC

## 2011-09-24 NOTE — Assessment & Plan Note (Signed)
Patient is a past history of palpitations.  She has not been aware of any recent sustained rapid heart action.  Is not expressing any chest pain or any signs or symptoms of congestive heart failure.

## 2011-09-24 NOTE — Progress Notes (Signed)
Clarene Duke Date of Birth:  11/10/1945 [facility] [address] [place], [place]  [address] [phone]  Fax   [phone]  HPI: This pleasant 66 year old woman is seen for a six-month followup office visit.  She has a history of palpitations and a history of hypercholesterolemia.  Is also had a history of anxiety and depression.  She is followed by a counselor -- PERRLA.  She continues to smoke against advice.  She does not have any history of ischemic heart disease and she had a normal nuclear stress test in 2001 in 1996 she had cardiac catheterization which showed normal coronary arteries with no significant irregularities.  Current Outpatient Prescriptions  Medication Sig Dispense Refill  . atenolol (TENORMIN) 50 MG tablet TAKE 1/2 TABLET TWICE DAILY  100 tablet  3  . buPROPion (WELLBUTRIN XL) 300 MG 24 hr tablet Take 300 mg by mouth daily.        . CHLORDIAZEPOXIDE HCL PO Take by mouth daily.        . citalopram (CELEXA) 20 MG tablet Take 20 mg by mouth daily.        Marland Kitchen HYDROcodone-acetaminophen (VICODIN) 5-500 MG per tablet prn      . lovastatin (MEVACOR) 20 MG tablet TAKE 1 TABLET EVERY DAY  30 tablet  11  . omeprazole (PRILOSEC) 20 MG capsule Take 1 capsule (20 mg total) by mouth daily.  180 capsule  0  . traZODone (DESYREL) 150 MG tablet Take 300 mg by mouth at bedtime.          Allergies  Allergen Reactions  . Cephalexin     Patient Active Problem List  Diagnoses  . BENIGN NEOPLASM OF ADRENAL GLAND  . HYPERLIPIDEMIA  . ANXIETY  . HYPERTENSION  . MYOCARDIAL INFARCTION, HX OF  . COPD  . ACUTE DILATATION OF STOMACH  . DIVERTICULOSIS, COLON  . FLATULENCE-GAS-BLOATING  . DIARRHEA  . CHANGE IN BOWELS  . ABDOMINAL PAIN, GENERALIZED  . COLONIC POLYPS, ADENOMATOUS, HX OF  . HEARTBURN    History  Smoking status  . Current Everyday Smoker -- 1.0 packs/day  Smokeless tobacco  . Not on file    History  Alcohol Use No    Family History    Problem Relation Age of Onset  . Heart attack Father     Review of Systems: The patient denies any heat or cold intolerance.  No weight gain or weight loss.  The patient denies headaches or blurry vision.  There is no cough or sputum production.  The patient denies dizziness.  There is no hematuria or hematochezia.  The patient denies any muscle aches or arthritis.  The patient denies any rash.  The patient denies frequent falling or instability.  There is no history of depression or anxiety.  All other systems were reviewed and are negative.   Physical Exam: Filed Vitals:   09/24/11 0859  BP: 114/71  Pulse: 69   General appearance reveals an elderly woman in no acute distressThe head and neck exam reveals pupils equal and reactive.  Extraocular movements are full.  There is no scleral icterus.  The mouth and pharynx are normal.  The neck is supple.  The carotids reveal no bruits.  The jugular venous pressure is normal.  The  thyroid is not enlarged.  There is no lymphadenopathy.  The chest is clear to percussion and auscultation.  There are no rales or rhonchi.  Expansion of the chest is symmetrical.  The precordium is  quiet.  The first heart sound is normal.  The second heart sound is physiologically split.  There is no murmur gallop rub or click.  There is no abnormal lift or heave.  The abdomen is soft and nontender.  The bowel sounds are normal.  The liver and spleen are not enlarged.  There are no abdominal masses.  There are no abdominal bruits.  Extremities reveal good pedal pulses.  There is no phlebitis or edema.  There is no cyanosis or clubbing.  Strength is normal and symmetrical in all extremities.  There is no lateralizing weakness.  There are no sensory deficits.  The skin is warm and dry.  There is no rash.     Assessment / Plan: Continue same medication.  We refilled her omeprazole today.  She was counseled on smoking cessation.  She rechecked in 6 months for followup office  visit and fasting lab work.

## 2011-09-24 NOTE — Patient Instructions (Signed)
Will obtain labs today and call you with the results  Your physician recommends that you continue on your current medications as directed. Please refer to the Current Medication list given to you today.  Your physician wants you to follow-up in: 6 months You will receive a reminder letter in the mail two months in advance. If you don't receive a letter, please call our office to schedule the follow-up appointment.   

## 2011-09-24 NOTE — Assessment & Plan Note (Signed)
The patient is under the care of a neurosurgeon, Dr. Wynetta Emery, regarding her spinal stenosis.  She does have known weakness related to this.  She does not anticipate any surgery in the future.

## 2011-09-24 NOTE — Assessment & Plan Note (Signed)
The patient continues to smoke a pack of cigarettes a day.  We counseled her on the importance of quitting.  She hopes to quit soon but acknowledged that right now her nose her to bed.  She does have exertional dyspnea related to COPD.

## 2011-09-25 ENCOUNTER — Telehealth: Payer: Self-pay | Admitting: Cardiology

## 2011-09-25 MED ORDER — OMEPRAZOLE 20 MG PO CPDR: 20.0000 mg | DELAYED_RELEASE_CAPSULE | Freq: Every day | ORAL | Status: DC

## 2011-09-25 NOTE — Telephone Encounter (Signed)
Rx sent again

## 2011-09-25 NOTE — Telephone Encounter (Signed)
New Problem   Patient came in for OV on 09/24/11, was prescribed generic for nexium but it was never received by pharmacist.   Please return call to patient at hm#   Preferred Pharmacy   [facility]  [address] [place])  [address], [place], [place] [address] [phone]

## 2011-09-26 ENCOUNTER — Telehealth: Payer: Self-pay | Admitting: *Deleted

## 2011-09-26 NOTE — Telephone Encounter (Signed)
Advised of labs 

## 2011-09-26 NOTE — Telephone Encounter (Signed)
Message copied by Burnell Blanks on Wed Sep 26, 2011  8:39 AM ------      Message from: Cassell Clement      Created: Mon Sep 24, 2011  9:03 PM       Please report.  The labs are stable.  Continue same meds.  Continue careful diet.

## 2011-10-09 ENCOUNTER — Other Ambulatory Visit: Payer: Self-pay | Admitting: Neurosurgery

## 2011-10-09 DIAGNOSIS — M25551 Pain in right hip: Secondary | ICD-10-CM

## 2011-10-16 ENCOUNTER — Ambulatory Visit
Admission: RE | Admit: 2011-10-16 | Discharge: 2011-10-16 | Disposition: A | Discharge status: Home / Self Care | Payer: Medicare Other | Source: Ambulatory Visit | Attending: Neurosurgery | Admitting: Neurosurgery

## 2011-10-16 ENCOUNTER — Other Ambulatory Visit: Payer: Self-pay | Admitting: Cardiology

## 2011-10-16 DIAGNOSIS — Z1231 Encounter for screening mammogram for malignant neoplasm of breast: Secondary | ICD-10-CM

## 2011-10-16 DIAGNOSIS — M25551 Pain in right hip: Secondary | ICD-10-CM

## 2011-11-07 ENCOUNTER — Ambulatory Visit (HOSPITAL_COMMUNITY)
Admission: RE | Admit: 2011-11-07 | Discharge: 2011-11-07 | Disposition: A | Discharge status: Home / Self Care | Payer: Medicare Other | Source: Ambulatory Visit | Attending: Cardiology | Admitting: Cardiology

## 2011-11-07 DIAGNOSIS — Z1231 Encounter for screening mammogram for malignant neoplasm of breast: Secondary | ICD-10-CM

## 2012-02-12 ENCOUNTER — Encounter (HOSPITAL_COMMUNITY): Payer: Self-pay | Admitting: Pharmacy Technician

## 2012-02-12 ENCOUNTER — Encounter (HOSPITAL_COMMUNITY): Payer: Self-pay | Admitting: General Practice

## 2012-02-12 ENCOUNTER — Encounter: Payer: Self-pay | Admitting: Nurse Practitioner

## 2012-02-12 ENCOUNTER — Ambulatory Visit (INDEPENDENT_AMBULATORY_CARE_PROVIDER_SITE_OTHER): Payer: Medicare Other | Admitting: Nurse Practitioner

## 2012-02-12 ENCOUNTER — Observation Stay (HOSPITAL_COMMUNITY)
Admission: AD | Admit: 2012-02-12 | Discharge: 2012-02-13 | DRG: 287 | Disposition: A | Discharge status: Home / Self Care | Payer: Medicare Other | Source: Ambulatory Visit | Attending: Cardiology | Admitting: Cardiology

## 2012-02-12 ENCOUNTER — Other Ambulatory Visit: Payer: Self-pay | Admitting: Nurse Practitioner

## 2012-02-12 ENCOUNTER — Observation Stay (HOSPITAL_COMMUNITY): Payer: Medicare Other

## 2012-02-12 VITALS — BP 100/50 | HR 68 | Ht 69.0 in | Wt 173.2 lb

## 2012-02-12 DIAGNOSIS — R079 Chest pain, unspecified: Secondary | ICD-10-CM

## 2012-02-12 DIAGNOSIS — R0602 Shortness of breath: Secondary | ICD-10-CM

## 2012-02-12 DIAGNOSIS — Z23 Encounter for immunization: Secondary | ICD-10-CM

## 2012-02-12 HISTORY — DX: Shortness of breath: R06.02

## 2012-02-12 HISTORY — DX: Essential (primary) hypertension: I10

## 2012-02-12 HISTORY — DX: Personal history of other medical treatment: Z92.89

## 2012-02-12 HISTORY — DX: Low back pain, unspecified: M54.50

## 2012-02-12 HISTORY — DX: Low back pain: M54.5

## 2012-02-12 HISTORY — DX: Personal history of other diseases of the digestive system: Z87.19

## 2012-02-12 HISTORY — DX: Other chronic pain: G89.29

## 2012-02-12 HISTORY — DX: Angina pectoris, unspecified: I20.9

## 2012-02-12 LAB — COMPREHENSIVE METABOLIC PANEL
ALT: 13 U/L (ref 0–35)
AST: 15 U/L (ref 0–37)
Albumin: 4.2 g/dL (ref 3.5–5.2)
Alkaline Phosphatase: 47 U/L (ref 39–117)
BUN: 12 mg/dL (ref 6–23)
CO2: 29 mEq/L (ref 19–32)
Calcium: 9.9 mg/dL (ref 8.4–10.5)
Chloride: 102 mEq/L (ref 96–112)
Creatinine, Ser: 0.97 mg/dL (ref 0.50–1.10)
GFR calc Af Amer: 69 mL/min — ABNORMAL LOW (ref >90)
GFR calc non Af Amer: 59 mL/min — ABNORMAL LOW (ref >90)
Glucose, Bld: 80 mg/dL (ref 70–99)
Potassium: 4.3 mEq/L (ref 3.5–5.1)
Sodium: 140 mEq/L (ref 135–145)
Total Bilirubin: 0.3 mg/dL (ref 0.3–1.2)
Total Protein: 7.4 g/dL (ref 6.0–8.3)

## 2012-02-12 LAB — DIFFERENTIAL
Basophils Absolute: 0 10*3/uL (ref 0.0–0.1)
Basophils Relative: 0 % (ref 0–1)
Eosinophils Absolute: 0.2 10*3/uL (ref 0.0–0.7)
Eosinophils Relative: 2 % (ref 0–5)
Lymphocytes Relative: 37 % (ref 12–46)
Lymphs Abs: 2.5 10*3/uL (ref 0.7–4.0)
Monocytes Absolute: 0.6 10*3/uL (ref 0.1–1.0)
Monocytes Relative: 10 % (ref 3–12)
Neutro Abs: 3.4 10*3/uL (ref 1.7–7.7)
Neutrophils Relative %: 51 % (ref 43–77)

## 2012-02-12 LAB — CBC
HCT: 42.3 % (ref 36.0–46.0)
Hemoglobin: 14.4 g/dL (ref 12.0–15.0)
Hemoglobin: 13.7 g/dL (ref 12.0–15.0)
MCH: 33 pg (ref 26.0–34.0)
MCH: 32.9 pg (ref 26.0–34.0)
MCHC: 34 g/dL (ref 30.0–36.0)
MCHC: 33.7 g/dL (ref 30.0–36.0)
MCV: 96.8 fL (ref 78.0–100.0)
Platelets: 180 10*3/uL (ref 150–400)
Platelets: 176 10*3/uL (ref 150–400)
RBC: 4.37 MIL/uL (ref 3.87–5.11)
RBC: 4.16 MIL/uL (ref 3.87–5.11)
RDW: 12.3 % (ref 11.5–15.5)
WBC: 6.7 10*3/uL (ref 4.0–10.5)

## 2012-02-12 LAB — HEPARIN LEVEL (UNFRACTIONATED): Heparin Unfractionated: 1.22 IU/mL — ABNORMAL HIGH (ref 0.30–0.70)

## 2012-02-12 LAB — PROTIME-INR
INR: 1.08 (ref 0.00–1.49)
Prothrombin Time: 14.2 seconds (ref 11.6–15.2)

## 2012-02-12 LAB — MAGNESIUM: Magnesium: 2 mg/dL (ref 1.5–2.5)

## 2012-02-12 LAB — CARDIAC PANEL(CRET KIN+CKTOT+MB+TROPI)
CK, MB: 2.5 ng/mL (ref 0.3–4.0)
CK, MB: 2.5 ng/mL (ref 0.3–4.0)
Relative Index: INVALID (ref 0.0–2.5)
Relative Index: INVALID (ref 0.0–2.5)
Total CK: 92 U/L (ref 7–177)
Total CK: 75 U/L (ref 7–177)
Troponin I: <0.3 ng/mL (ref <0.30)
Troponin I: <0.3 ng/mL (ref <0.30)

## 2012-02-12 LAB — TSH: TSH: 2.02 u[IU]/mL (ref 0.350–4.500)

## 2012-02-12 LAB — APTT: aPTT: 39 seconds — ABNORMAL HIGH (ref 24–37)

## 2012-02-12 LAB — D-DIMER, QUANTITATIVE: D-Dimer, Quant: 0.56 ug/mL-FEU — ABNORMAL HIGH (ref 0.00–0.48)

## 2012-02-12 MED ORDER — MORPHINE SULFATE 2 MG/ML IJ SOLN
1.0000 mg | INTRAMUSCULAR | Status: DC | PRN
  Administered 2012-02-12 (×2): 1 mg via INTRAVENOUS
  Filled 2012-02-12 (×2): qty 1

## 2012-02-12 MED ORDER — SODIUM CHLORIDE 0.9 % IJ SOLN: 3.0000 mL | INTRAMUSCULAR | Status: DC | PRN

## 2012-02-12 MED ORDER — SODIUM CHLORIDE 0.9 % IV SOLN: 250.0000 mL | INTRAVENOUS | Status: DC | PRN

## 2012-02-12 MED ORDER — HEPARIN (PORCINE) IN NACL 100-0.45 UNIT/ML-% IJ SOLN
12.0000 [IU]/kg/h | INTRAMUSCULAR | Status: DC
  Administered 2012-02-12: 12 [IU]/kg/h via INTRAVENOUS
  Filled 2012-02-12: qty 250

## 2012-02-12 MED ORDER — ASPIRIN 300 MG RE SUPP
300.0000 mg | RECTAL | Status: AC
  Filled 2012-02-12: qty 1

## 2012-02-12 MED ORDER — ALPRAZOLAM 0.25 MG PO TABS: 0.2500 mg | ORAL_TABLET | Freq: Two times a day (BID) | ORAL | Status: DC | PRN

## 2012-02-12 MED ORDER — PNEUMOCOCCAL VAC POLYVALENT 25 MCG/0.5ML IJ INJ
0.5000 mL | INJECTION | INTRAMUSCULAR | Status: AC
  Administered 2012-02-13: 0.5 mL via INTRAMUSCULAR
  Filled 2012-02-12: qty 0.5

## 2012-02-12 MED ORDER — SODIUM CHLORIDE 0.9 % IV SOLN
INTRAVENOUS | Status: DC
  Administered 2012-02-13: 20 mL/h via INTRAVENOUS

## 2012-02-12 MED ORDER — PANTOPRAZOLE SODIUM 40 MG PO TBEC
40.0000 mg | DELAYED_RELEASE_TABLET | Freq: Every day | ORAL | Status: DC
  Administered 2012-02-13: 40 mg via ORAL
  Filled 2012-02-12: qty 1

## 2012-02-12 MED ORDER — SODIUM CHLORIDE 0.9 % IJ SOLN
3.0000 mL | Freq: Two times a day (BID) | INTRAMUSCULAR | Status: DC
  Administered 2012-02-12: 3 mL via INTRAVENOUS

## 2012-02-12 MED ORDER — SODIUM CHLORIDE 0.9 % IJ SOLN
3.0000 mL | Freq: Two times a day (BID) | INTRAMUSCULAR | Status: DC
  Administered 2012-02-13: 3 mL via INTRAVENOUS

## 2012-02-12 MED ORDER — BUPROPION HCL ER (XL) 300 MG PO TB24
300.0000 mg | ORAL_TABLET | Freq: Every day | ORAL | Status: DC
  Administered 2012-02-13: 300 mg via ORAL
  Filled 2012-02-12: qty 1

## 2012-02-12 MED ORDER — NITROGLYCERIN 0.4 MG SL SUBL: 0.4000 mg | SUBLINGUAL_TABLET | SUBLINGUAL | Status: DC | PRN

## 2012-02-12 MED ORDER — TRAZODONE HCL 150 MG PO TABS
300.0000 mg | ORAL_TABLET | Freq: Every day | ORAL | Status: DC
  Administered 2012-02-12: 300 mg via ORAL
  Filled 2012-02-12 (×2): qty 2

## 2012-02-12 MED ORDER — DIAZEPAM 5 MG PO TABS
10.0000 mg | ORAL_TABLET | ORAL | Status: AC
  Administered 2012-02-13: 10 mg via ORAL
  Filled 2012-02-12: qty 2

## 2012-02-12 MED ORDER — CITALOPRAM HYDROBROMIDE 20 MG PO TABS
20.0000 mg | ORAL_TABLET | Freq: Every day | ORAL | Status: DC
  Administered 2012-02-13: 20 mg via ORAL
  Filled 2012-02-12: qty 1

## 2012-02-12 MED ORDER — ASPIRIN 81 MG PO CHEW
324.0000 mg | CHEWABLE_TABLET | ORAL | Status: AC
  Administered 2012-02-13: 324 mg via ORAL
  Filled 2012-02-12: qty 4

## 2012-02-12 MED ORDER — ASPIRIN 81 MG PO CHEW
324.0000 mg | CHEWABLE_TABLET | ORAL | Status: AC
  Administered 2012-02-12: 324 mg via ORAL
  Filled 2012-02-12: qty 4

## 2012-02-12 MED ORDER — NITROGLYCERIN IN D5W 200-5 MCG/ML-% IV SOLN: 3.0000 ug/min | INTRAVENOUS | Status: DC

## 2012-02-12 MED ORDER — ASPIRIN EC 81 MG PO TBEC
81.0000 mg | DELAYED_RELEASE_TABLET | Freq: Every day | ORAL | Status: DC
  Filled 2012-02-12: qty 1

## 2012-02-12 MED ORDER — HEPARIN BOLUS VIA INFUSION
3900.0000 [IU] | Freq: Once | INTRAVENOUS | Status: AC
  Administered 2012-02-12: 3900 [IU] via INTRAVENOUS
  Filled 2012-02-12: qty 3900

## 2012-02-12 MED ORDER — ATENOLOL 25 MG PO TABS
25.0000 mg | ORAL_TABLET | Freq: Two times a day (BID) | ORAL | Status: DC
  Administered 2012-02-12 – 2012-02-13 (×2): 25 mg via ORAL
  Filled 2012-02-12 (×3): qty 1

## 2012-02-12 MED ORDER — ACETAMINOPHEN 325 MG PO TABS
650.0000 mg | ORAL_TABLET | ORAL | Status: DC | PRN
  Administered 2012-02-13: 650 mg via ORAL
  Filled 2012-02-12: qty 2

## 2012-02-12 MED ORDER — ONDANSETRON HCL 4 MG/2ML IJ SOLN: 4.0000 mg | Freq: Four times a day (QID) | INTRAMUSCULAR | Status: DC | PRN

## 2012-02-12 MED ORDER — SIMVASTATIN 40 MG PO TABS
40.0000 mg | ORAL_TABLET | Freq: Every day | ORAL | Status: DC
  Filled 2012-02-12 (×2): qty 1

## 2012-02-12 NOTE — Progress Notes (Signed)
Patient currently chest pain/pressure free.  Traci [name]

## 2012-02-12 NOTE — Patient Instructions (Signed)
We are going to admit you to the hospital.  

## 2012-02-12 NOTE — Progress Notes (Signed)
66yo female started on heparin for CP with heparin level drawn just 3.5hr after bolus given so level reported at 1.22 cannot be interpreted; will recheck in a few hours.  Vernard Gambles, PharmD, BCPS 02/12/2012 11:20 PM

## 2012-02-12 NOTE — Progress Notes (Addendum)
Orders reviewed from office admission. Pre-Cath phase of care is attached to most of them. This will allow her labs/studies to get ordered; the only thing that will need to be assessed tomorrow is to make sure her meds are continued post-cath. I was informed by the cath lab staff that the docs have to go through a med rec post-procedure, so careful attention will need to be made to make sure her meds go through. I discussed this with nursing staff as well. Arizona Sorn PA-C   Addendum: Also stopped in to see patient due to low-grade CP. EKG reviewed. Nonspecific changes noted that correlate with office EKG, no acute ST elevation.  IV team is working on gaining IV access. While she denies overt pain at present, she "can tell it's there" - she reports this pain has been essentially constant now for 2 weeks but has times when it comes on hard (now is not one of those times). No associated sx. VSS. Patient appears comfortable, not diaphoretic, smiling. Will try rx 1mg  morphine to relieve pain once IV access obtained. Will continue to monitor closely; await labs. Nethra Mehlberg PA-C

## 2012-02-12 NOTE — Consult Note (Signed)
ANTICOAGULATION CONSULT NOTE - Initial Consult  Pharmacy Consult for Heparin Indication: ACS/STEMI  Allergies  Allergen Reactions  . Cephalexin Hives    Patient Measurements: Height: 5\' 9"  (175.3 cm) Weight: 173 lb 3.2 oz (78.563 kg) IBW/kg (Calculated) : 66.2  Heparin Dosing Wt:  78.6 kg  Vital Signs: Temp: 97.2 F (36.2 C) (06/25 1618) Temp src: Oral (06/25 1618) BP: 115/73 mmHg (06/25 1618) Pulse Rate: 76  (06/25 1618)  Labs: Estimated Creatinine Clearance: 51.9 ml/min (by C-G formula based on Cr of 1.1).  Medical / Surgical History: Past Medical History  Diagnosis Date  . Hyperlipidemia   . Anxiety   . Depression   . Acute cholecystitis   . Dyspepsia   . H/O cardiac catheterization 1996    normal  . Tobacco abuse    Past Surgical History  Procedure Date  . Cardiac catheterization 04/19/1995    EF 65-70%  . Cholecystectomy   . US echocardiography 12/17/2007    EF 55-60%    Medications:  Prescriptions prior to admission  Medication Sig Dispense Refill  . atenolol (TENORMIN) 50 MG tablet Take 50 mg by mouth 2 (two) times daily.      Marland Kitchen buPROPion (WELLBUTRIN XL) 150 MG 24 hr tablet Take 300 mg by mouth daily.       . busPIRone (BUSPAR) 10 MG tablet Take 10 mg by mouth every morning.      Marland Kitchen HYDROcodone-acetaminophen (VICODIN) 5-500 MG per tablet Take 1 tablet by mouth every 4 (four) hours as needed. For pain      . omeprazole (PRILOSEC) 20 MG capsule Take 1 capsule (20 mg total) by mouth daily.  180 capsule  0  . traZODone (DESYREL) 150 MG tablet Take 300 mg by mouth at bedtime.        Scheduled:    . aspirin  324 mg Oral NOW   Or  . aspirin  300 mg Rectal NOW  . aspirin  324 mg Oral Pre-Cath  . aspirin EC  81 mg Oral Daily  . atenolol  25 mg Oral BID  . buPROPion  300 mg Oral Daily  . citalopram  20 mg Oral Daily  . diazepam  10 mg Oral On Call  . pantoprazole  40 mg Oral Q1200  . simvastatin  40 mg Oral q1800  . sodium chloride  3 mL Intravenous  Q12H  . sodium chloride  3 mL Intravenous Q12H  . traZODone  300 mg Oral QHS   Infusions:    . sodium chloride    . nitroGLYCERIN      Assessment:  Chest pain syndrome    Patient presents with chest pain over the past 2 weeks. Using NTG. Worse today with associated dyspnea. Has radiation to the neck and back.  Tentative plans for repeat cardiac catheterization in the am. Follow labs, enzymes, CXR and d-dimer .  Begin Heparin infusion pending results of cardiac cath.  Goal of Therapy:   Heparin level 0.3-0.7 units/ml   Plan:   Heparin bolus 3900 units x 1.  Begin Heparin infusion at 950 units/hr  Will check 6 hours heparin level, CBC.  Daily Heparin level and CBC while on Heparin.   Margel Joens, Elisha Headland, Pharm.D. 02/12/2012 4:32 PM

## 2012-02-12 NOTE — Plan of Care (Addendum)
Problem: Phase I Progression Outcomes Goal: Anginal pain relieved Outcome: Progressing Patient admitted from home with 3/10 chest pressure.  Dayna Dunn PA up on floor and made aware.  EKG obtained.  Morphine given per orders.  Patient continues with chest pressure 1/10.  Cardiac enzymes negative.  D-dimer slightly elevated.  Results called to Ward Givens NP.  Chaney Malling Dunn PA and Ward Givens NP aware IV NTG not initiated.  Will continue to monitor.   Colman Cater

## 2012-02-12 NOTE — Assessment & Plan Note (Addendum)
Patient presents with chest pain over the past 2 weeks. Using NTG. Worse today with associated dyspnea. Has radiation to the neck and back. Continues to smoke. Risk factors include HLD, HTN and tobacco use. She is subsequently seen with Dr. Swaziland and we both favor admission with plans for repeat cardiac catheterization in the am. Will also check labs, enzymes, CXR and d-dimer upon admission. Blood pressure is low and will limit the use of NTG. Patient is agreeable to this plan.

## 2012-02-12 NOTE — H&P (Signed)
Traci [name]   02/12/2012 3:30 PM Office Visit  MRN: [id number]   Description: 66 year old female  Provider: Norma Fredrickson, NP  Department: [facility]         Diagnoses     Chest pain syndrome   - Primary    786.50      Reason for Visit     Chest Pain    Work in visit. Seen for Dr. Patty Sermons. Seen with Dr. Swaziland (DOD)       Reason For Visit History Recorded         Progress Notes     Norma Fredrickson, NP  02/12/2012  3:14 PM  Signed    Clarene Duke Date of Birth: [birth year] Medical Record #[id number]   History of Present Illness: Traci [name] is seen today for a work in visit. She is seen for Dr. Patty Sermons. She has no known CAD with remote cath in 1996 and last nuclear stress test in 2001. She does have ongoing tobacco abuse, palpitations, COPD, anxiety, GERD and HLD. She was last here in February and was felt to be doing ok.    She comes in today. She is here alone. She walked in to the office with complaints of chest pain. She notes that for the past 2 weeks she has not done well. She has a substernal chest pain that radiates up to her neck and into her back. She has been using NTG x 1 with relief. Today, her pain was much worse and she did not get relief with the NTG. Blood pressure is running low. She continues to smoke. She has associated dyspnea and feels hot. Got scared and came to the office for evaluation. Now feeling some better and rates her discomfort a "3" on a scale of 0 to 10. It was a "9" earlier today. She does not exercise. She is quite sedentary. She is very limited by her back, hip and leg pain. Her risk factors include HLD, HTN and ongoing tobacco abuse.     Current Outpatient Prescriptions on File Prior to Visit   Medication  Sig  Dispense  Refill   .  atenolol (TENORMIN) 50 MG tablet  TAKE 1/2 TABLET TWICE DAILY   100 tablet   3   .  buPROPion (WELLBUTRIN XL) 300 MG 24 hr tablet  Take 300 mg by mouth daily.           .   CHLORDIAZEPOXIDE HCL PO  Take by mouth daily.           .  citalopram (CELEXA) 20 MG tablet  Take 20 mg by mouth daily.          Marland Kitchen  HYDROcodone-acetaminophen (VICODIN) 5-500 MG per tablet  prn         .  lovastatin (MEVACOR) 20 MG tablet  TAKE 1 TABLET EVERY DAY   30 tablet   11   .  omeprazole (PRILOSEC) 20 MG capsule  Take 1 capsule (20 mg total) by mouth daily.   180 capsule   0   .  traZODone (DESYREL) 150 MG tablet  Take 300 mg by mouth at bedtime.               Allergies   Allergen  Reactions   .  Cephalexin         Past Medical History   Diagnosis  Date   .  Hyperlipidemia     .  Anxiety     .  Depression     .  Acute cholecystitis     .  Dyspepsia     .  H/O cardiac catheterization  1996       normal   .  Tobacco abuse         Past Surgical History   Procedure  Date   .  Cardiac catheterization  04/19/1995       EF 65-70%   .  Cholecystectomy     .  US echocardiography  12/17/2007       EF 55-60%       History   Smoking status   .  Current Everyday Smoker -- 1.0 packs/day   Smokeless tobacco   .  Not on file       History   Alcohol Use  No       Family History   Problem  Relation  Age of Onset   .  Heart attack  Father        Review of Systems: The review of systems is positive for chest pain.  All other systems were reviewed and are negative.   Physical Exam: BP 90/58  Pulse 68  Ht 5\' 9"  (1.753 m)  Wt 173 lb 3.2 oz (78.563 kg)  BMI 25.58 kg/m2 Patient is pleasant and in no acute distress. Skin is warm and dry. Color is normal.  HEENT is unremarkable. Normocephalic/atraumatic. PERRL. Sclera are nonicteric. Neck is supple. No masses. No JVD. Lungs are clear. Cardiac exam shows a regular rate and rhythm. She has no chest wall tenderness. Abdomen is soft. Extremities are without edema. Gait and ROM are intact. No gross neurologic deficits noted.     LABORATORY DATA: EKG today shows sinus rhythm with no acute changes. Tracing is reviewed with Dr.  Swaziland.     Lab Results   Component  Value  Date     WBC  7.5  06/23/2010     HGB  13.8  06/23/2010     HCT  40.3  06/23/2010     PLT  173.0  06/23/2010     GLUCOSE  80  09/24/2011     CHOL  139  09/24/2011     TRIG  85.0  09/24/2011     HDL  56.30  09/24/2011     LDLCALC  66  09/24/2011     ALT  16  09/24/2011     AST  15  09/24/2011     NA  142  09/24/2011     K  3.9  09/24/2011     CL  108  09/24/2011     CREATININE  1.1  09/24/2011     BUN  13  09/24/2011     CO2  29  09/24/2011     INR  1.07  05/24/2009        Assessment / Plan:        Chest pain syndrome - Starlette Thurow, NP  02/12/2012  3:14 PM  Addendum Patient presents with chest pain over the past 2 weeks. Using NTG. Worse today with associated dyspnea. Has radiation to the neck and back. Continues to smoke. Risk factors include HLD, HTN and tobacco use. She is subsequently seen with Dr. Swazil[place] and we both favor admission with plans for repeat cardiac catheterization in the am. Will also check labs, enzymes, CXR and d-dimer upon admission. Blood pressure is low and will limit the use of NTG. Patient is  agreeable to this plan.    Previous Version      Electronic signature on 02/12/2012          Vitals - Last Recorded       BP Pulse Ht Wt BMI      100/50 68 5\' 9"  (1.753 m) 173 lb 3.2 oz (78.563 kg) 25.58 kg/m2         Vitals History Recorded       Orders Placed This Encounter     EKG 12-Lead [EKG1 Custom]           Level of Service     LOS - NO CHARGE [NC1]         All Charges for This Encounter       Code Description Service Date Service Provider Modifiers Quantity    93000 PR ELECTROCARDIOGRAM, COMPLETE 02/12/2012 Rosalio Macadamia, NP   1    [phone] PR TOBACCO USE CESSATION INTERMEDIATE 3-10 MINUTES 02/12/2012 Rosalio Macadamia, NP   1    NC1 LOS - NO CHARGE 02/12/2012 Rosalio Macadamia, NP   1      Patient Instructions     We are going to admit you to the hospital.             Previous Visit         Provider  Department Encounter #    09/26/2011  8:39 AM Peter Swaziland, MD [facility] [id number]                Claudette Wermuth Eppard   02/12/2012 3:30 PM Office Visit  MRN: [id number]   Description: 66 year old female  Provider: Norma Fredrickson, NP  Department: [facility]         Diagnoses     Chest pain syndrome   - Primary    786.50      Reason for Visit     Chest Pain    Work in visit. Seen for Dr. Patty Sermons. Seen with Dr. [place] (DOD)       Reason For Visit History Recorded         Progress Notes     Norma Fredrickson, NP  02/12/2012  3:14 PM  Signed    Clarene Duke Date of Birth: 19-Jun-1946 Medical Record #[id number]   History of Present Illness: Traci [name] is seen today for a work in visit. She is seen for Dr. Patty Sermons. She has no known CAD with remote cath in 1996 and last nuclear stress test in 2001. She does have ongoing tobacco abuse, palpitations, COPD, anxiety, GERD and HLD. She was last here in February and was felt to be doing ok.    She comes in today. She is here alone. She walked in to the office with complaints of chest pain. She notes that for the past 2 weeks she has not done well. She has a substernal chest pain that radiates up to her neck and into her back. She has been using NTG x 1 with relief. Today, her pain was much worse and she did not get relief with the NTG. Blood pressure is running low. She continues to smoke. She has associated dyspnea and feels hot. Got scared and came to the office for evaluation. Now feeling some better and rates her discomfort a "3" on a scale of 0 to 10. It was a "9" earlier today. She does not exercise. She is quite sedentary. She is very limited by her back, hip and leg pain.  Her risk factors include HLD, HTN and ongoing tobacco abuse.     Current Outpatient Prescriptions on File Prior to Visit   Medication  Sig  Dispense  Refill   .  atenolol (TENORMIN) 50 MG tablet  TAKE 1/2 TABLET TWICE DAILY   100 tablet   3   .   buPROPion (WELLBUTRIN XL) 300 MG 24 hr tablet  Take 300 mg by mouth daily.           .  CHLORDIAZEPOXIDE HCL PO  Take by mouth daily.           .  citalopram (CELEXA) 20 MG tablet  Take 20 mg by mouth daily.          Marland Kitchen  HYDROcodone-acetaminophen (VICODIN) 5-500 MG per tablet  prn         .  lovastatin (MEVACOR) 20 MG tablet  TAKE 1 TABLET EVERY DAY   30 tablet   11   .  omeprazole (PRILOSEC) 20 MG capsule  Take 1 capsule (20 mg total) by mouth daily.   180 capsule   0   .  traZODone (DESYREL) 150 MG tablet  Take 300 mg by mouth at bedtime.               Allergies   Allergen  Reactions   .  Cephalexin         Past Medical History   Diagnosis  Date   .  Hyperlipidemia     .  Anxiety     .  Depression     .  Acute cholecystitis     .  Dyspepsia     .  H/O cardiac catheterization  1996       normal   .  Tobacco abuse         Past Surgical History   Procedure  Date   .  Cardiac catheterization  04/19/1995       EF 65-70%   .  Cholecystectomy     .  US echocardiography  12/17/2007       EF 55-60%       History   Smoking status   .  Current Everyday Smoker -- 1.0 packs/day   Smokeless tobacco   .  Not on file       History   Alcohol Use  No       Family History   Problem  Relation  Age of Onset   .  Heart attack  Father        Review of Systems: The review of systems is positive for chest pain.  All other systems were reviewed and are negative.   Physical Exam: BP 90/58  Pulse 68  Ht 5\' 9"  (1.753 m)  Wt 173 lb 3.2 oz (78.563 kg)  BMI 25.58 kg/m2 Patient is pleasant and in no acute distress. Skin is warm and dry. Color is normal.  HEENT is unremarkable. Normocephalic/atraumatic. PERRL. Sclera are nonicteric. Neck is supple. No masses. No JVD. Lungs are clear. Cardiac exam shows a regular rate and rhythm. She has no chest wall tenderness. Abdomen is soft. Extremities are without edema. Gait and ROM are intact. No gross neurologic deficits noted.     LABORATORY  DATA: EKG today shows sinus rhythm with no acute changes. Tracing is reviewed with Dr. Swaziland.     Lab Results   Component  Value  Date     WBC  7.5  06/23/2010  HGB  13.8  06/23/2010     HCT  40.3  06/23/2010     PLT  173.0  06/23/2010     GLUCOSE  80  09/24/2011     CHOL  139  09/24/2011     TRIG  85.0  09/24/2011     HDL  56.30  09/24/2011     LDLCALC  66  09/24/2011     ALT  16  09/24/2011     AST  15  09/24/2011     NA  142  09/24/2011     K  3.9  09/24/2011     CL  108  09/24/2011     CREATININE  1.1  09/24/2011     BUN  13  09/24/2011     CO2  29  09/24/2011     INR  1.07  05/24/2009        Assessment / Plan:        Chest pain syndrome - Jordyan Hardiman, NP  02/12/2012  3:14 PM  Addendum Patient presents with chest pain over the past 2 weeks. Using NTG. Worse today with associated dyspnea. Has radiation to the neck and back. Continues to smoke. Risk factors include HLD, HTN and tobacco use. She is subsequently seen with Dr. Sw[place]iland and we both favor admission with plans for repeat cardiac catheterization in the am. Will also check labs, enzymes, CXR and d-dimer upon admission. May need CTA of the chest to rule out aortic dissection as well. Will tentatively plan for cath in the am. Dr. Patty Sermons to see in the am.  Blood pressure is low and will limit the use of NTG. Patient is agreeable to this plan.    Previous Version      Electronic signature on 02/12/2012          Vitals - Last Recorded       BP Pulse Ht Wt BMI      100/50 68 5\' 9"  (1.753 m) 173 lb 3.2 oz (78.563 kg) 25.58 kg/m2         Vitals History Recorded       Orders Placed This Encounter     EKG 12-Lead [EKG1 Custom]           Level of Service     LOS - NO CHARGE [NC1]         All Charges for This Encounter       Code Description Service Date Service Provider Modifiers Quantity    93000 PR ELECTROCARDIOGRAM, COMPLETE 02/12/2012 Rosalio Macadamia, NP   1    [phone] PR TOBACCO USE CESSATION INTERMEDIATE 3-10 MINUTES  02/12/2012 Rosalio Macadamia, NP   1    NC1 LOS - NO CHARGE 02/12/2012 Rosalio Macadamia, NP   1      Patient Instructions     We are going to admit you to the hospital.             Previous Visit         Provider Department Encounter #    09/26/2011  8:39 AM Peter Swaziland, MD [facility] [id number]

## 2012-02-12 NOTE — Progress Notes (Signed)
Traci [name] Date of Birth: 08/08/1946 Medical Record #[id number]  History of Present Illness: Traci [name] is seen today for a work in visit. She is seen for Traci [name]. She has no known CAD with remote cath in 1996 and last nuclear stress test in 2001. She does have ongoing tobacco abuse, palpitations, COPD, anxiety, GERD and HLD. She was last here in February and was felt to be doing ok.   She comes in today. She is here alone. She walked in to the office with complaints of chest pain. She notes that for the past 2 weeks she has not done well. She has a substernal chest pain that radiates up to her neck and into her back. She has been using NTG x 1 with relief. Today, her pain was much worse and she did not get relief with the NTG. Blood pressure is running low. She continues to smoke. She has associated dyspnea and feels hot. Got scared and came to the office for evaluation. Now feeling some better and rates her discomfort a "3" on a scale of 0 to 10. It was a "9" earlier today. She does not exercise. She is quite sedentary. She is very limited by her back, hip and leg pain. Her risk factors include HLD, HTN and ongoing tobacco abuse.   Current Outpatient Prescriptions on File Prior to Visit  Medication Sig Dispense Refill  . atenolol (TENORMIN) 50 MG tablet TAKE 1/2 TABLET TWICE DAILY  100 tablet  3  . buPROPion (WELLBUTRIN XL) 300 MG 24 hr tablet Take 300 mg by mouth daily.        . CHLORDIAZEPOXIDE HCL PO Take by mouth daily.        . citalopram (CELEXA) 20 MG tablet Take 20 mg by mouth daily.       Marland Kitchen HYDROcodone-acetaminophen (VICODIN) 5-500 MG per tablet prn      . lovastatin (MEVACOR) 20 MG tablet TAKE 1 TABLET EVERY DAY  30 tablet  11  . omeprazole (PRILOSEC) 20 MG capsule Take 1 capsule (20 mg total) by mouth daily.  180 capsule  0  . traZODone (DESYREL) 150 MG tablet Take 300 mg by mouth at bedtime.          Allergies  Allergen Reactions  . Cephalexin     Past Medical  History  Diagnosis Date  . Hyperlipidemia   . Anxiety   . Depression   . Acute cholecystitis   . Dyspepsia   . H/O cardiac catheterization 1996    normal  . Tobacco abuse     Past Surgical History  Procedure Date  . Cardiac catheterization 04/19/1995    EF 65-70%  . Cholecystectomy   . US echocardiography 12/17/2007    EF 55-60%    History  Smoking status  . Current Everyday Smoker -- 1.0 packs/day  Smokeless tobacco  . Not on file    History  Alcohol Use No    Family History  Problem Relation Age of Onset  . Heart attack Father     Review of Systems: The review of systems is positive for chest pain.  All other systems were reviewed and are negative.  Physical Exam: BP 90/58  Pulse 68  Ht 5\' 9"  (1.753 m)  Wt 173 lb 3.2 oz (78.563 kg)  BMI 25.58 kg/m2 Patient is pleasant and in no acute distress. Skin is warm and dry. Color is normal.  HEENT is unremarkable. Normocephalic/atraumatic. PERRL. Sclera are nonicteric. Neck is supple. No  masses. No JVD. Lungs are clear. Cardiac exam shows a regular rate and rhythm. She has no chest wall tenderness. Abdomen is soft. Extremities are without edema. Gait and ROM are intact. No gross neurologic deficits noted.   LABORATORY DATA: EKG today shows sinus rhythm with no acute changes. Tracing is reviewed with Dr. Sw[place].   Lab Results  Component Value Date   WBC 7.5 06/23/2010   HGB 13.8 06/23/2010   HCT 40.3 06/23/2010   PLT 173.0 06/23/2010   GLUCOSE 80 09/24/2011   CHOL 139 09/24/2011   TRIG 85.0 09/24/2011   HDL 56.30 09/24/2011   LDLCALC 66 09/24/2011   ALT 16 09/24/2011   AST 15 09/24/2011   NA 142 09/24/2011   K 3.9 09/24/2011   CL 108 09/24/2011   CREATININE 1.1 09/24/2011   BUN 13 09/24/2011   CO2 29 09/24/2011   INR 1.07 05/24/2009     Assessment / Plan:

## 2012-02-13 ENCOUNTER — Encounter (HOSPITAL_COMMUNITY)
Admission: AD | Disposition: A | Discharge status: Home / Self Care | Payer: Self-pay | Source: Ambulatory Visit | Attending: Cardiology

## 2012-02-13 DIAGNOSIS — R079 Chest pain, unspecified: Secondary | ICD-10-CM

## 2012-02-13 HISTORY — PX: LEFT HEART CATHETERIZATION WITH CORONARY ANGIOGRAM: SHX5451

## 2012-02-13 LAB — HEMOGLOBIN A1C
Hgb A1c MFr Bld: 5.4 % (ref <5.7)
Mean Plasma Glucose: 108 mg/dL (ref <117)

## 2012-02-13 LAB — CBC
HCT: 37.1 % (ref 36.0–46.0)
Platelets: 148 10*3/uL — ABNORMAL LOW (ref 150–400)
RBC: 3.82 MIL/uL — ABNORMAL LOW (ref 3.87–5.11)
RDW: 12.4 % (ref 11.5–15.5)
WBC: 5.4 10*3/uL (ref 4.0–10.5)

## 2012-02-13 LAB — CARDIAC PANEL(CRET KIN+CKTOT+MB+TROPI)
CK, MB: 1.7 ng/mL (ref 0.3–4.0)
Relative Index: INVALID (ref 0.0–2.5)
Total CK: 62 U/L (ref 7–177)
Troponin I: <0.3 ng/mL (ref <0.30)

## 2012-02-13 LAB — HEPARIN LEVEL (UNFRACTIONATED): Heparin Unfractionated: 0.44 IU/mL (ref 0.30–0.70)

## 2012-02-13 SURGERY — LEFT HEART CATHETERIZATION WITH CORONARY ANGIOGRAM
Anesthesia: LOCAL

## 2012-02-13 MED ORDER — NITROGLYCERIN 0.2 MG/ML ON CALL CATH LAB
INTRAVENOUS | Status: AC
  Filled 2012-02-13: qty 1

## 2012-02-13 MED ORDER — TRAZODONE HCL 150 MG PO TABS
300.0000 mg | ORAL_TABLET | Freq: Every day | ORAL | Status: DC
  Filled 2012-02-13: qty 2

## 2012-02-13 MED ORDER — ACETAMINOPHEN 325 MG PO TABS: 650.0000 mg | ORAL_TABLET | ORAL | Status: DC | PRN

## 2012-02-13 MED ORDER — SODIUM CHLORIDE 0.9 % IV SOLN
INTRAVENOUS | Status: AC
  Administered 2012-02-13: 16:00:00 via INTRAVENOUS

## 2012-02-13 MED ORDER — BUSPIRONE HCL 10 MG PO TABS: 10.0000 mg | ORAL_TABLET | Freq: Every morning | ORAL | Status: DC

## 2012-02-13 MED ORDER — MIDAZOLAM HCL 2 MG/2ML IJ SOLN
INTRAMUSCULAR | Status: AC
  Filled 2012-02-13: qty 2

## 2012-02-13 MED ORDER — ATENOLOL 50 MG PO TABS
50.0000 mg | ORAL_TABLET | Freq: Two times a day (BID) | ORAL | Status: DC
  Filled 2012-02-13: qty 1

## 2012-02-13 MED ORDER — BUPROPION HCL ER (XL) 300 MG PO TB24
300.0000 mg | ORAL_TABLET | Freq: Every day | ORAL | Status: DC
  Filled 2012-02-13: qty 1

## 2012-02-13 MED ORDER — HEPARIN (PORCINE) IN NACL 100-0.45 UNIT/ML-% IJ SOLN
700.0000 [IU]/h | INTRAMUSCULAR | Status: DC
  Administered 2012-02-13: 700 [IU]/h via INTRAVENOUS
  Filled 2012-02-13: qty 250

## 2012-02-13 MED ORDER — FENTANYL CITRATE 0.05 MG/ML IJ SOLN
INTRAMUSCULAR | Status: AC
  Filled 2012-02-13: qty 2

## 2012-02-13 MED ORDER — HEPARIN (PORCINE) IN NACL 2-0.9 UNIT/ML-% IJ SOLN
INTRAMUSCULAR | Status: AC
  Filled 2012-02-13: qty 2000

## 2012-02-13 MED ORDER — PANTOPRAZOLE SODIUM 40 MG PO TBEC: 40.0000 mg | DELAYED_RELEASE_TABLET | Freq: Every day | ORAL | Status: DC

## 2012-02-13 MED ORDER — LIDOCAINE HCL (PF) 1 % IJ SOLN
INTRAMUSCULAR | Status: AC
  Filled 2012-02-13: qty 30

## 2012-02-13 MED ORDER — ONDANSETRON HCL 4 MG/2ML IJ SOLN: 4.0000 mg | Freq: Four times a day (QID) | INTRAMUSCULAR | Status: DC | PRN

## 2012-02-13 NOTE — Progress Notes (Signed)
Patient's oxygen saturations are 89 % room air while sleeping.  +D-dimer yesterday.  Alinda Money PA with [facility] paged and notified, and stated to proceed with discharge once bedrest complete if groin stable.  Patient is current smoker.  Colman Cater

## 2012-02-13 NOTE — Progress Notes (Signed)
Utilization review complete 

## 2012-02-13 NOTE — Progress Notes (Signed)
While patient on bedrest and sleeping oxygen saturations 87-90% room air.  Bedrest complete and patient ambulated 600 feet in hallway and saturations 91-96% room air.  Groin level 0, no bleeding or hematoma; bandage removed and band aid applied.  Went to discharge patient and she stated she was tired, initially wanted to stay overnight.  Offered to call PA, and patient then stated she would go home.  Patient received 10 mg valium prior to Cardiac cath today, instructed not to take trazadone at home tonight if she continues to be tired.  Reviewed discharge instructions with patient and husband, they stated their understanding.  Patient discharged via wheelchair.  Colman Cater

## 2012-02-13 NOTE — CV Procedure (Signed)
    Cardiac Cath Note  OZZIE REMMERS [id number] March 05, 1946  Procedure: Left Heart Cardiac Catheterization Note Indications: chest pain  Procedure Details Consent: Obtained Time Out: Verified patient identification, verified procedure, site/side was marked, verified correct patient position, special equipment/implants available, Radiology Safety Procedures followed,  medications/allergies/relevent history reviewed, required imaging and test results available.  Performed   Medications: Fentanyl: 50 mcg IV Versed: 2 mg IV  The right femoral artery was easily canulated using a modified Seldinger technique.  Hemodynamics:   LV pressure: 119/17 Aortic pressure: 121/65  Angiography   Left Main: normal  Left anterior Descending: large vessel, minor luminal irregularities, no significant stenosis. Normal diagonals  Left Circumflex: smooth and normal.  The OM branches are very small and are normal.  The LCx terminates as a posterior lateral branch.  Right Coronary Artery:  Moderate and dominate.  Smooth and normal . PDA and PLSA are normal  LV Gram: normal LV function  EF 65-70%  Complications: No apparent complications Patient did tolerate procedure well.  Conclusions:   Minor luminal irregularities Normal LV function.  DC to home tonight after 3 hours bedrest assuming she remains stable.   Vesta Mixer, Montez Hageman., MD, [facility] 02/13/2012, 2:45 PM Office - [phone] Pager [phone]

## 2012-02-13 NOTE — Progress Notes (Signed)
ANTICOAGULATION CONSULT NOTE - Follow Up Consult  Pharmacy Consult for heparin Indication: chest pain/ACS  Labs:  Basename 02/13/12 0915 02/13/12 0505 02/13/12 0100 02/12/12 2149 02/12/12 2145 02/12/12 1750  HGB -- 12.6 -- 13.7 -- --  HCT -- 37.1 -- 40.6 -- 42.3  PLT -- 148* -- 176 -- 180  APTT -- -- -- -- -- 39*  LABPROT -- -- -- -- -- 14.2  INR -- -- -- -- -- 1.08  HEPARINUNFRC 0.44 -- 1.34* 1.22* -- --  CREATININE -- -- -- -- -- 0.97  CKTOTAL -- 62 -- -- 75 92  CKMB -- 1.7 -- -- 2.5 2.5  TROPONINI -- <0.30 -- -- <0.30 <0.30    Assessment: Heparin now therapeutic. Awaiting cath.  Goal of Therapy:  Heparin level 0.3-0.7 units/ml   Plan:  Cont heparin drip at 700 units/hr  Clide Cliff PharmD BCPS 02/13/2012,11:10 AM

## 2012-02-13 NOTE — Discharge Summary (Signed)
CARDIOLOGY DISCHARGE SUMMARY   Patient ID: DESAREE DOWNEN MRN: [id number] DOB/AGE: February 07, 1946 66 y.o.  Admit date: 02/12/2012 Discharge date: 02/13/2012  Primary Discharge Diagnosis:  Chest pain Secondary Discharge Diagnosis:  Past Medical History  Diagnosis Date  . Hyperlipidemia   . Anxiety   . Depression   . Dyspepsia   . Hypertension   . Anginal pain   . History of bronchitis   . History of blood transfusion 1963    w/childbirth  . Shortness of breath 02/12/12    "all the time"  . H/O hiatal hernia   . Chronic lower back pain    Procedures: Left heart cardiac catheterization, coronary arteriogram, left ventriculogram   Hospital Course: Ms. Mcneely is a 66 year old female with a history of chest pain for which she last had a stress test and 2001 but no coronary artery disease. She had chest pain and was seen in the office. She was admitted for further evaluation and treatment.  Her cardiac enzymes were negative for MI. It was decided to reassess her anatomy with cardiac catheterization. This was performed on 02/13/2012. The results are listed below.  She had no coronary artery disease. Post-procedure, her chest pain had resolved. Her d-dimer was minimally elevated but she had no hypoxia, no tachycardia and no ECG changes to indicate the need for further evaluation.  She was ambulating without difficulty or SOB and is considered stable for discharge, to follow up as an outpatient.  Labs:  Lab Results  Component Value Date   WBC 5.4 02/13/2012   HGB 12.6 02/13/2012   HCT 37.1 02/13/2012   MCV 97.1 02/13/2012   PLT 148* 02/13/2012    Lab 02/12/12 1750  NA 140  K 4.3  CL 102  CO2 29  BUN 12  CREATININE 0.97  CALCIUM 9.9  PROT 7.4  BILITOT 0.3  ALKPHOS 47  ALT 13  AST 15  GLUCOSE 80    Basename 02/13/12 0505 02/12/12 2145 02/12/12 1750  CKTOTAL 62 75 92  CKMB 1.7 2.5 2.5  CKMBINDEX -- -- --  TROPONINI <0.30 <0.30 <0.30   Lipid Panel     Component Value  Date/Time   CHOL 139 09/24/2011 0926   TRIG 85.0 09/24/2011 0926   HDL 56.30 09/24/2011 0926   CHOLHDL 2 09/24/2011 0926   VLDL 17.0 09/24/2011 0926   LDLCALC 66 09/24/2011 0926   Lab Results  Component Value Date   DDIMER 0.56* 02/12/2012    Basename 02/12/12 1750  INR 1.08     Radiology: X-ray Chest Pa And Lateral 02/12/2012  *RADIOLOGY REPORT*  Clinical Data: Chest pain.  Pre cardiac catheterization.  CHEST - 2 VIEW  Comparison: Two-view chest x-ray 10/24/2009, 03/30/2008.  Findings: Cardiac silhouette normal in size, unchanged.  Thoracic aorta minimally atherosclerotic, unchanged.  Hilar and mediastinal contours otherwise unremarkable.  Emphysematous changes throughout both lungs.  Biapical pleuroparenchymal scarring, unchanged.  Old calcified granuloma in the left upper lobe, unchanged.  Linear scarring in the right middle lobe, unchanged.  No new pulmonary parenchymal abnormalities.  No pleural effusions.  Visualized bony thorax intact.  IMPRESSION: COPD/emphysema.  Old granulomatous disease.  Scarring in the right middle lobe.  No acute cardiopulmonary disease.  Stable examination.  Original Report Authenticated By: Arnell Sieving, M.D.   Cardiac Cath: 02/13/2012 Left Main: normal  Left anterior Descending: large vessel, minor luminal irregularities, no significant stenosis. Normal diagonals  Left Circumflex: smooth and normal. The OM branches are very small and are normal.  The LCx terminates as a posterior lateral branch.  Right Coronary Artery: Moderate and dominate. Smooth and normal . PDA and PLSA are normal  LV Gram: normal LV function EF 65-70%  EKG: 13-Feb-2012 06:43:58 [id number] ROUTINE RECORD Normal sinus rhythm Nonspecific ST abnormality Abnormal ECG 24mm/s 54mm/mV 100Hz  8.0.1 12SL 241 CID: 1 Referred by: Maisie Fus BRACKBILL Unconfirmed Vent. rate 68 BPM PR interval 184 ms QRS duration 86 ms QT/QTc 438/465 ms P-R-T axes 77 51 28  FOLLOW UP PLANS AND  APPOINTMENTS Allergies  Allergen Reactions  . Cephalexin Hives    Medication List  As of 02/13/2012  5:25 PM   TAKE these medications         atenolol 50 MG tablet   Commonly known as: TENORMIN   Take 50 mg by mouth 2 (two) times daily.      buPROPion 150 MG 24 hr tablet   Commonly known as: WELLBUTRIN XL   Take 300 mg by mouth daily.      busPIRone 10 MG tablet   Commonly known as: BUSPAR   Take 10 mg by mouth every morning.      HYDROcodone-acetaminophen 5-500 MG per tablet   Commonly known as: VICODIN   Take 1 tablet by mouth every 4 (four) hours as needed. For pain      omeprazole 20 MG capsule   Commonly known as: PRILOSEC   Take 1 capsule (20 mg total) by mouth daily.      traZODone 150 MG tablet   Commonly known as: DESYREL   Take 300 mg by mouth at bedtime.           Follow-up Information    Follow up with  [facility] (The office will call.)    Contact information:   [address] [place] [address]          BRING ALL MEDICATIONS WITH YOU TO FOLLOW UP APPOINTMENTS  Time spent with patient to include physician time: 34 min Signed: Theodore Demark 02/13/2012, 4:14 PM Co-Sign MD

## 2012-02-13 NOTE — H&P (View-Only) (Signed)
 PROGRESS NOTE  Subjective:   Pt is a 66 yo who was admitted from the office yesterday with CP.  Cardiac enzymes are negative  Objective:    Vital Signs:   Temp:  [97.2 F (36.2 C)-98.1 F (36.7 C)] 98.1 F (36.7 C) (06/26 0500) Pulse Rate:  [67-76] 67  (06/26 0500) Resp:  [18-20] 18  (06/26 0500) BP: (90-117)/(50-73) 102/65 mmHg (06/26 0500) SpO2:  [95 %-96 %] 96 % (06/26 0500) Weight:  [173 lb 3.2 oz (78.563 kg)] 173 lb 3.2 oz (78.563 kg) (06/25 1618)  Last BM Date: 02/11/12   24-hour weight change: Weight change:   Weight trends: Filed Weights   02/12/12 1618  Weight: 173 lb 3.2 oz (78.563 kg)    Intake/Output:        Physical Exam: BP 102/65  Pulse 67  Temp 98.1 F (36.7 C) (Oral)  Resp 18  Ht 5' 9" (1.753 m)  Wt 173 lb 3.2 oz (78.563 kg)  BMI 25.58 kg/m2  SpO2 96%  General: Vital signs reviewed and noted. Well-developed, well-nourished, in no acute distress; alert, appropriate and cooperative .  Head: Normocephalic, atraumatic.  Eyes: conjunctivae/corneas clear.  EOM's intact.   Throat: normal  Neck: Supple. Normal carotids. No JVD  Lungs:  Clear to auscultation  Heart: Regular rate,  With normal  S1 S2. No murmurs, gallops or rubs  Abdomen:  Soft, non-tender, non-distended with normoactive bowel sounds. No hepatomegaly. No rebound/guarding. No abdominal masses.  Extremities: Distal pedal pulses are 2+ .  No edema.    Neurologic: A&O X3, CN II - XII are grossly intact. Motor strength is 5/5 in the all 4 extremities.  Psych: Responds to questions appropriately with normal affect.    Labs: BMET:  Basename 02/12/12 1750  NA 140  K 4.3  CL 102  CO2 29  GLUCOSE 80  BUN 12  CREATININE 0.97  CALCIUM 9.9  MG 2.0  PHOS --    Liver function tests:  Basename 02/12/12 1750  AST 15  ALT 13  ALKPHOS 47  BILITOT 0.3  PROT 7.4  ALBUMIN 4.2   No results found for this basename: LIPASE:2,AMYLASE:2 in the last 72 hours  CBC:  Basename  02/13/12 0505 02/12/12 2149 02/12/12 1750  WBC 5.4 6.7 --  NEUTROABS -- -- 3.4  HGB 12.6 13.7 --  HCT 37.1 40.6 --  MCV 97.1 97.6 --  PLT 148* 176 --    Cardiac Enzymes:  Basename 02/13/12 0505 02/12/12 2145 02/12/12 1750  CKTOTAL 62 75 92  CKMB 1.7 2.5 2.5  TROPONINI <0.30 <0.30 <0.30    Coagulation Studies:  Basename 02/12/12 1750  LABPROT 14.2  INR 1.08    Other: No components found with this basename: POCBNP:3  Basename 02/12/12 1750  DDIMER 0.56*    Basename 02/12/12 1750  HGBA1C 5.4   No results found for this basename: CHOL,HDL,LDLCALC,TRIG,CHOLHDL in the last 72 hours  Basename 02/12/12 1750  TSH 2.020  T4TOTAL --  T3FREE --  THYROIDAB --   No results found for this basename: VITAMINB12,FOLATE,FERRITIN,TIBC,IRON,RETICCTPCT in the last 72 hours   Tele:  NSR  Medications:    Infusions:    . sodium chloride 20 mL/hr (02/13/12 0403)  . heparin 700 Units/hr (02/13/12 0404)  . nitroGLYCERIN    . DISCONTD: heparin 12 Units/kg/hr (02/12/12 1814)    Scheduled Medications:    . aspirin  324 mg Oral NOW   Or  . aspirin  300 mg Rectal NOW  .   aspirin  324 mg Oral Pre-Cath  . aspirin EC  81 mg Oral Daily  . atenolol  25 mg Oral BID  . buPROPion  300 mg Oral Daily  . citalopram  20 mg Oral Daily  . diazepam  10 mg Oral On Call  . heparin  3,900 Units Intravenous Once  . pantoprazole  40 mg Oral Q1200  . pneumococcal 23 valent vaccine  0.5 mL Intramuscular Tomorrow-1000  . simvastatin  40 mg Oral q1800  . sodium chloride  3 mL Intravenous Q12H  . sodium chloride  3 mL Intravenous Q12H  . traZODone  300 mg Oral QHS    Assessment/ Plan:    1. Chest pain/ angina: associated with ST/T abnormalities.  Cardiac enzymes are negative.   Discussed risks, benefits, options.  She understands and agrees to proceed.  Disposition: cath this afternoon Length of Stay: 1  Delorse Shane J. Eula Mazzola, Jr., MD, FACC 02/13/2012, 8:17 AM Office [phone] Pager  [phone]    

## 2012-02-13 NOTE — Discharge Instructions (Signed)
NO Tobacco  PLEASE REMEMBER TO BRING ALL OF YOUR MEDICATIONS TO EACH OF YOUR FOLLOW-UP OFFICE VISITS.  PLEASE ATTEND ALL SCHEDULED FOLLOW-UP APPOINTMENTS.   Activity: Increase activity slowly as tolerated. You may shower, but no soaking baths (or swimming) for 1 week. No driving for 2 days. No lifting over 5 lbs for 1 week. No sexual activity for 1 week.   You May Return to Work: in 1 week (if applicable)  Wound Care: You may wash cath site gently with soap and water. Keep cath site clean and dry. If you notice pain, swelling, bleeding or pus at your cath site, please call [phone].    Groin Site Care Refer to this sheet in the next few weeks. These instructions provide you with information on caring for yourself after your procedure. Your caregiver may also give you more specific instructions. Your treatment has been planned according to current medical practices, but problems sometimes occur. Call your caregiver if you have any problems or questions after your procedure. HOME CARE INSTRUCTIONS  You may shower 24 hours after the procedure. Remove the bandage (dressing) and gently wash the site with plain soap and water. Gently pat the site dry.   Do not apply powder or lotion to the site.   Do not sit in a bathtub, swimming pool, or whirlpool for 5 to 7 days.   No bending, squatting, or lifting anything over 10 pounds (4.5 kg) as directed by your caregiver.   Inspect the site at least twice daily.   Do not drive home if you are discharged the same day of the procedure. Have someone else drive you.   You may drive 24 hours after the procedure unless otherwise instructed by your caregiver.  What to expect:  Any bruising will usually fade within 1 to 2 weeks.   Blood that collects in the tissue (hematoma) may be painful to the touch. It should usually decrease in size and tenderness within 1 to 2 weeks.  SEEK IMMEDIATE MEDICAL CARE IF:  You have unusual pain at the groin site or  down the affected leg.   You have redness, warmth, swelling, or pain at the groin site.   You have drainage (other than a small amount of blood on the dressing).   You have chills.   You have a fever or persistent symptoms for more than 72 hours.   You have a fever and your symptoms suddenly get worse.   Your leg becomes pale, cool, tingly, or numb.   You have heavy bleeding from the site. Hold pressure on the site.  Document Released: 09/08/2010 Document Revised: 07/26/2011 Document Reviewed: 09/08/2010 [facility] Patient Information 2012 [place], [place].

## 2012-02-13 NOTE — Progress Notes (Signed)
PROGRESS NOTE  Subjective:   Pt is a 66 yo who was admitted from the office yesterday with CP.  Cardiac enzymes are negative  Objective:    Vital Signs:   Temp:  [97.2 F (36.2 C)-98.1 F (36.7 C)] 98.1 F (36.7 C) (06/26 0500) Pulse Rate:  [67-76] 67  (06/26 0500) Resp:  [18-20] 18  (06/26 0500) BP: (90-117)/(50-73) 102/65 mmHg (06/26 0500) SpO2:  [95 %-96 %] 96 % (06/26 0500) Weight:  [173 lb 3.2 oz (78.563 kg)] 173 lb 3.2 oz (78.563 kg) (06/25 1618)  Last BM Date: 02/11/12   24-hour weight change: Weight change:   Weight trends: Filed Weights   02/12/12 1618  Weight: 173 lb 3.2 oz (78.563 kg)    Intake/Output:        Physical Exam: BP 102/65  Pulse 67  Temp 98.1 F (36.7 C) (Oral)  Resp 18  Ht 5\' 9"  (1.753 m)  Wt 173 lb 3.2 oz (78.563 kg)  BMI 25.58 kg/m2  SpO2 96%  General: Vital signs reviewed and noted. Well-developed, well-nourished, in no acute distress; alert, appropriate and cooperative .  Head: Normocephalic, atraumatic.  Eyes: conjunctivae/corneas clear.  EOM's intact.   Throat: normal  Neck: Supple. Normal carotids. No JVD  Lungs:  Clear to auscultation  Heart: Regular rate,  With normal  S1 S2. No murmurs, gallops or rubs  Abdomen:  Soft, non-tender, non-distended with normoactive bowel sounds. No hepatomegaly. No rebound/guarding. No abdominal masses.  Extremities: Distal pedal pulses are 2+ .  No edema.    Neurologic: A&O X3, CN II - XII are grossly intact. Motor strength is 5/5 in the all 4 extremities.  Psych: Responds to questions appropriately with normal affect.    Labs: BMET:  Basename 02/12/12 1750  NA 140  K 4.3  CL 102  CO2 29  GLUCOSE 80  BUN 12  CREATININE 0.97  CALCIUM 9.9  MG 2.0  PHOS --    Liver function tests:  Basename 02/12/12 1750  AST 15  ALT 13  ALKPHOS 47  BILITOT 0.3  PROT 7.4  ALBUMIN 4.2   No results found for this basename: LIPASE:2,AMYLASE:2 in the last 72 hours  CBC:  Basename  02/13/12 0505 02/12/12 2149 02/12/12 1750  WBC 5.4 6.7 --  NEUTROABS -- -- 3.4  HGB 12.6 13.7 --  HCT 37.1 40.6 --  MCV 97.1 97.6 --  PLT 148* 176 --    Cardiac Enzymes:  Basename 02/13/12 0505 02/12/12 2145 02/12/12 1750  CKTOTAL 62 75 92  CKMB 1.7 2.5 2.5  TROPONINI <0.30 <0.30 <0.30    Coagulation Studies:  Basename 02/12/12 1750  LABPROT 14.2  INR 1.08    Other: No components found with this basename: POCBNP:3  Basename 02/12/12 1750  DDIMER 0.56*    Basename 02/12/12 1750  HGBA1C 5.4   No results found for this basename: CHOL,HDL,LDLCALC,TRIG,CHOLHDL in the last 72 hours  Basename 02/12/12 1750  TSH 2.020  T4TOTAL --  T3FREE --  THYROIDAB --   No results found for this basename: VITAMINB12,FOLATE,FERRITIN,TIBC,IRON,RETICCTPCT in the last 72 hours   Tele:  NSR  Medications:    Infusions:    . sodium chloride 20 mL/hr (02/13/12 0403)  . heparin 700 Units/hr (02/13/12 0404)  . nitroGLYCERIN    . DISCONTD: heparin 12 Units/kg/hr (02/12/12 1814)    Scheduled Medications:    . aspirin  324 mg Oral NOW   Or  . aspirin  300 mg Rectal NOW  .  aspirin  324 mg Oral Pre-Cath  . aspirin EC  81 mg Oral Daily  . atenolol  25 mg Oral BID  . buPROPion  300 mg Oral Daily  . citalopram  20 mg Oral Daily  . diazepam  10 mg Oral On Call  . heparin  3,900 Units Intravenous Once  . pantoprazole  40 mg Oral Q1200  . pneumococcal 23 valent vaccine  0.5 mL Intramuscular Tomorrow-1000  . simvastatin  40 mg Oral q1800  . sodium chloride  3 mL Intravenous Q12H  . sodium chloride  3 mL Intravenous Q12H  . traZODone  300 mg Oral QHS    Assessment/ Plan:    1. Chest pain/ angina: associated with ST/T abnormalities.  Cardiac enzymes are negative.   Discussed risks, benefits, options.  She understands and agrees to proceed.  Disposition: cath this afternoon Length of Stay: 1  Vesta Mixer, Montez Hageman., MD, [facility] 02/13/2012, 8:17 AM Office [phone] Pager  [phone]

## 2012-02-13 NOTE — Interval H&P Note (Signed)
History and Physical Interval Note:  02/13/2012 2:45 PM  Traci [name]  has presented today for surgery, with the diagnosis of chest pain  The various methods of treatment have been discussed with the patient and family. After consideration of risks, benefits and other options for treatment, the patient has consented to  Procedure(s) (LRB): LEFT HEART CATHETERIZATION WITH CORONARY ANGIOGRAM (N/A) as a surgical intervention .  The patient's history has been reviewed, patient examined, no change in status, stable for surgery.  I have reviewed the patients' chart and labs.  Questions were answered to the patient's satisfaction.     Elyn Aquas.

## 2012-02-13 NOTE — Progress Notes (Signed)
ANTICOAGULATION CONSULT NOTE - Follow Up Consult  Pharmacy Consult for heparin Indication: chest pain/ACS  Labs:  Basename 02/13/12 0100 02/12/12 2149 02/12/12 2145 02/12/12 1750  HGB -- 13.7 -- 14.4  HCT -- 40.6 -- 42.3  PLT -- 176 -- 180  APTT -- -- -- 39*  LABPROT -- -- -- 14.2  INR -- -- -- 1.08  HEPARINUNFRC 1.34* 1.22* -- --  CREATININE -- -- -- 0.97  CKTOTAL -- -- 75 92  CKMB -- -- 2.5 2.5  TROPONINI -- -- <0.30 <0.30    Assessment: 66yo female remains supratherapeutic on heparin when level was drawn appropriately six hours after started.  Goal of Therapy:  Heparin level 0.3-0.7 units/ml   Plan:  Will hold heparin gtt x59min then resume at lower rate of 700 units/hr (decrease of ~4 units/kg/hr) and check level in 6hr.  Colleen Can PharmD BCPS 02/13/2012,2:43 AM

## 2012-02-29 ENCOUNTER — Ambulatory Visit (INDEPENDENT_AMBULATORY_CARE_PROVIDER_SITE_OTHER): Payer: Medicare Other | Admitting: Cardiology

## 2012-02-29 ENCOUNTER — Encounter: Payer: Self-pay | Admitting: Cardiology

## 2012-02-29 VITALS — BP 110/60 | HR 78 | Ht 69.0 in | Wt 170.0 lb

## 2012-02-29 DIAGNOSIS — Z7189 Other specified counseling: Secondary | ICD-10-CM

## 2012-02-29 DIAGNOSIS — Z716 Tobacco abuse counseling: Secondary | ICD-10-CM

## 2012-02-29 DIAGNOSIS — E785 Hyperlipidemia, unspecified: Secondary | ICD-10-CM

## 2012-02-29 DIAGNOSIS — E7800 Pure hypercholesterolemia, unspecified: Secondary | ICD-10-CM

## 2012-02-29 DIAGNOSIS — M4800 Spinal stenosis, site unspecified: Secondary | ICD-10-CM

## 2012-02-29 DIAGNOSIS — F411 Generalized anxiety disorder: Secondary | ICD-10-CM

## 2012-02-29 DIAGNOSIS — F17200 Nicotine dependence, unspecified, uncomplicated: Secondary | ICD-10-CM

## 2012-02-29 DIAGNOSIS — R079 Chest pain, unspecified: Secondary | ICD-10-CM

## 2012-02-29 NOTE — Patient Instructions (Addendum)
Your physician recommends that you continue on your current medications as directed. Please refer to the Current Medication list given to you today.  Your physician wants you to follow-up in: 6 months with fasting labs (lp/bmet/hfp)  You will receive a reminder letter in the mail two months in advance. If you don't receive a letter, please call our office to schedule the follow-up appointment.  

## 2012-02-29 NOTE — Progress Notes (Signed)
Traci [name] Date of Birth:  1946-06-24 [facility] [address] [place], [place]  [address] [phone]         Fax   [phone]  History of Present Illness: This pleasant 66 year old woman is seen for a scheduled 6 month followup office visit.  She has a past history of atypical chest pain.  She was hospitalized in June 2013 and on 02/13/12 she underwent a left heart cardiac catheterization which showed nonobstructive coronary disease and showed an ejection fraction of 60-70%.  She did not have any obstructive disease.  She does get relief of her chest pain with sublingual nitroglycerin implying that she may have some microvascular disease related to her smoking.  Current Outpatient Prescriptions  Medication Sig Dispense Refill  . atenolol (TENORMIN) 50 MG tablet Take 50 mg by mouth as directed. Taking 1/2 bid      . buPROPion (WELLBUTRIN XL) 150 MG 24 hr tablet Take 300 mg by mouth daily.       . busPIRone (BUSPAR) 10 MG tablet Take 10 mg by mouth every morning.      Marland Kitchen HYDROcodone-acetaminophen (VICODIN) 5-500 MG per tablet Take 1 tablet by mouth every 4 (four) hours as needed. For pain      . omeprazole (PRILOSEC) 20 MG capsule Take 1 capsule (20 mg total) by mouth daily.  180 capsule  0  . traZODone (DESYREL) 150 MG tablet Take 300 mg by mouth at bedtime.       . CYMBALTA 30 MG capsule Take 30 mg by mouth Daily.      Marland Kitchen lovastatin (MEVACOR) 20 MG tablet Take 20 mg by mouth Daily.        Allergies  Allergen Reactions  . Cephalexin Hives    Patient Active Problem List  Diagnosis  . BENIGN NEOPLASM OF ADRENAL GLAND  . HYPERLIPIDEMIA  . ANXIETY  . HYPERTENSION  . MYOCARDIAL INFARCTION, HX OF  . COPD  . ACUTE DILATATION OF STOMACH  . DIVERTICULOSIS, COLON  . FLATULENCE-GAS-BLOATING  . DIARRHEA  . CHANGE IN BOWELS  . ABDOMINAL PAIN, GENERALIZED  . COLONIC POLYPS, ADENOMATOUS, HX OF  . HEARTBURN  . Heart palpitations  . Spinal stenosis  . Chest  pain syndrome    History  Smoking status  . Current Everyday Smoker -- 1.0 packs/day for 50 years  . Types: Cigarettes  Smokeless tobacco  . Never Used    History  Alcohol Use No    Family History  Problem Relation Age of Onset  . Heart attack Father     Review of Systems: Constitutional: no fever chills diaphoresis or fatigue or change in weight.  Head and neck: no hearing loss, no epistaxis, no photophobia or visual disturbance. Respiratory: No cough, shortness of breath or wheezing. Cardiovascular: No chest pain peripheral edema, palpitations. Gastrointestinal: No abdominal distention, no abdominal pain, no change in bowel habits hematochezia or melena. Genitourinary: No dysuria, no frequency, no urgency, no nocturia. Musculoskeletal:No arthralgias, no back pain, no gait disturbance or myalgias. Neurological: No dizziness, no headaches, no numbness, no seizures, no syncope, no weakness, no tremors. Hematologic: No lymphadenopathy, no easy bruising. Psychiatric: No confusion, no hallucinations, no sleep disturbance.    Physical Exam: Filed Vitals:   02/29/12 0936  BP: 110/60  Pulse: 78   the general appearance reveals a well-developed well-nourished woman in no acute distress.  She does appear to be chronically ill.The head and neck exam reveals pupils equal and reactive.  Extraocular movements are  full.  There is no scleral icterus.  The mouth and pharynx are normal.  The neck is supple.  The carotids reveal no bruits.  The jugular venous pressure is normal.  The  thyroid is not enlarged.  There is no lymphadenopathy.  The chest is clear to percussion and auscultation.  There are no rales or rhonchi.  Expansion of the chest is symmetrical.  The precordium is quiet.  The first heart sound is normal.  The second heart sound is physiologically split.  There is no murmur gallop rub or click.  There is no abnormal lift or heave.  The abdomen is soft and nontender.  The bowel  sounds are normal.  The liver and spleen are not enlarged.  There are no abdominal masses.  There are no abdominal bruits.  Extremities reveal weak pedal pulses.  Her feet are cool to touch There is no phlebitis or edema.  There is no cyanosis or clubbing.  Strength is normal and symmetrical in all extremities.  There is no lateralizing weakness.  There are no sensory deficits.  The skin is warm and dry.  There is no rash.     Assessment / Plan:  Continue same medication.  Recheck in 6 months for followup office visit EKG and fasting lab work.  Counseled on smoking cessation

## 2012-02-29 NOTE — Assessment & Plan Note (Signed)
Patient has a history of hyperlipidemia and is on lovastatin.  She's not having any myalgias.  She does tire easily and this may be related to the fact that she continues to smoke a pack of cigarettes a day .  Today we counseled her again on the importance of quitting smoking.

## 2012-02-29 NOTE — Assessment & Plan Note (Signed)
The patient has had only minor chest discomfort since discharge from the hospital.  Her most recent catheter on 02/13/12 was essentially clean.  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

## 2012-02-29 NOTE — Assessment & Plan Note (Signed)
The patient continues to have problems with low back pain and sees Dr. Wynetta Emery.  She is on Vicodin.

## 2012-02-29 NOTE — Assessment & Plan Note (Signed)
The patient has a long history of anxiety and depression and is followed at the mental health clinic in [place].  She is on multiple neurotrophic drugs.

## 2012-03-21 ENCOUNTER — Other Ambulatory Visit: Payer: Self-pay | Admitting: Cardiology

## 2012-04-28 ENCOUNTER — Other Ambulatory Visit: Payer: Self-pay | Admitting: Cardiology

## 2012-04-28 NOTE — Telephone Encounter (Signed)
Refilled atenolol

## 2012-11-04 ENCOUNTER — Other Ambulatory Visit: Payer: Self-pay | Admitting: Cardiology

## 2012-11-04 DIAGNOSIS — Z1231 Encounter for screening mammogram for malignant neoplasm of breast: Secondary | ICD-10-CM

## 2012-11-12 ENCOUNTER — Ambulatory Visit (HOSPITAL_COMMUNITY)
Admission: RE | Admit: 2012-11-12 | Discharge: 2012-11-12 | Disposition: A | Discharge status: Home / Self Care | Payer: Medicare Other | Source: Ambulatory Visit | Attending: Cardiology | Admitting: Cardiology

## 2012-11-12 DIAGNOSIS — Z1231 Encounter for screening mammogram for malignant neoplasm of breast: Secondary | ICD-10-CM | POA: Insufficient documentation

## 2012-12-06 ENCOUNTER — Emergency Department (HOSPITAL_COMMUNITY)
Admission: EM | Admit: 2012-12-06 | Discharge: 2012-12-07 | Disposition: A | Discharge status: Home / Self Care | Payer: Medicare Other | Attending: Emergency Medicine | Admitting: Emergency Medicine

## 2012-12-06 ENCOUNTER — Emergency Department (HOSPITAL_COMMUNITY): Payer: Medicare Other

## 2012-12-06 DIAGNOSIS — F411 Generalized anxiety disorder: Secondary | ICD-10-CM | POA: Insufficient documentation

## 2012-12-06 DIAGNOSIS — Z79899 Other long term (current) drug therapy: Secondary | ICD-10-CM | POA: Insufficient documentation

## 2012-12-06 DIAGNOSIS — Z8679 Personal history of other diseases of the circulatory system: Secondary | ICD-10-CM | POA: Insufficient documentation

## 2012-12-06 DIAGNOSIS — Z8719 Personal history of other diseases of the digestive system: Secondary | ICD-10-CM | POA: Insufficient documentation

## 2012-12-06 DIAGNOSIS — F3289 Other specified depressive episodes: Secondary | ICD-10-CM | POA: Insufficient documentation

## 2012-12-06 DIAGNOSIS — R079 Chest pain, unspecified: Secondary | ICD-10-CM | POA: Insufficient documentation

## 2012-12-06 DIAGNOSIS — Z8709 Personal history of other diseases of the respiratory system: Secondary | ICD-10-CM | POA: Insufficient documentation

## 2012-12-06 DIAGNOSIS — G8929 Other chronic pain: Secondary | ICD-10-CM | POA: Insufficient documentation

## 2012-12-06 DIAGNOSIS — I10 Essential (primary) hypertension: Secondary | ICD-10-CM | POA: Insufficient documentation

## 2012-12-06 DIAGNOSIS — F17200 Nicotine dependence, unspecified, uncomplicated: Secondary | ICD-10-CM | POA: Insufficient documentation

## 2012-12-06 DIAGNOSIS — E785 Hyperlipidemia, unspecified: Secondary | ICD-10-CM | POA: Insufficient documentation

## 2012-12-06 DIAGNOSIS — F329 Major depressive disorder, single episode, unspecified: Secondary | ICD-10-CM | POA: Insufficient documentation

## 2012-12-06 LAB — CBC
HCT: 39.4 % (ref 36.0–46.0)
Platelets: 171 10*3/uL (ref 150–400)
RBC: 4.18 MIL/uL (ref 3.87–5.11)
RDW: 12.5 % (ref 11.5–15.5)

## 2012-12-06 MED ORDER — ASPIRIN 81 MG PO CHEW
CHEWABLE_TABLET | ORAL | Status: AC
  Administered 2012-12-06: 324 mg
  Filled 2012-12-06: qty 4

## 2012-12-06 NOTE — ED Notes (Signed)
Pt states that left sided CP that radiates to left shoulder and arm began yesterday before 11 am. Pain described as sharp shooting, with SOB, diaphoresis, lightheadedness, dizzy, and weakness. Denies N/V. Skin currently warm and dry

## 2012-12-06 NOTE — ED Provider Notes (Signed)
10:53 PM  Date: 12/06/2012  Rate: 104  Rhythm: sinus tachycardia  QRS Axis: normal  Intervals: normal  ST/T Wave abnormalities: nonspecific ST/T changes  Conduction Disutrbances:none  Narrative Interpretation: Abnormal EKG  Old EKG Reviewed: unchanged    Carleene Cooper III, MD 12/06/12 2256

## 2012-12-07 LAB — BASIC METABOLIC PANEL
BUN: 12 mg/dL (ref 6–23)
Chloride: 100 mEq/L (ref 96–112)
GFR calc Af Amer: 75 mL/min — ABNORMAL LOW (ref >90)
GFR calc non Af Amer: 65 mL/min — ABNORMAL LOW (ref >90)
Potassium: 3.8 mEq/L (ref 3.5–5.1)
Sodium: 137 mEq/L (ref 135–145)

## 2012-12-07 LAB — TROPONIN I: Troponin I: <0.3 ng/mL (ref <0.30)

## 2012-12-07 LAB — PRO B NATRIURETIC PEPTIDE: Pro B Natriuretic peptide (BNP): 107.7 pg/mL (ref 0–125)

## 2012-12-07 NOTE — ED Notes (Signed)
Pt states understanding of discharge instructions 

## 2012-12-07 NOTE — ED Provider Notes (Signed)
History     CSN: [id number]  Arrival date & time 12/06/12  2234   First MD Initiated Contact with Patient 12/06/12 2309      Chief Complaint  Patient presents with  . Chest Pain    (Consider location/radiation/quality/duration/timing/severity/associated sxs/prior treatment) HPI....intermittent chest pain since Friday morning,   2-3 episodes per day lasting approximately 15 minutes. nitroglycerin seems to help. Cardiac cath last year was normal. No dyspnea, nausea, diaphoresis.  radiation to left shoulder. Severity is mild.  Past Medical History  Diagnosis Date  . Hyperlipidemia   . Anxiety   . Depression   . Dyspepsia   . Hypertension   . Anginal pain   . History of bronchitis   . History of blood transfusion 1963    w/childbirth  . Shortness of breath 02/12/12    "all the time"  . H/O hiatal hernia   . Chronic lower back pain     Past Surgical History  Procedure Laterality Date  . US echocardiography  12/17/2007    EF 55-60%  . Vaginal hysterectomy  1980's  . Cholecystectomy  2010  . Cardiac catheterization  04/19/1995    EF 65-70%  . Lumbar disc surgery  early 2000's  . Cataract extraction w/ intraocular lens  implant, bilateral  2012  . Back surgery      Family History  Problem Relation Age of Onset  . Heart attack Father     History  Substance Use Topics  . Smoking status: Current Every Day Smoker -- 1.00 packs/day for 50 years    Types: Cigarettes  . Smokeless tobacco: Never Used  . Alcohol Use: No    OB History   Grav Para Term Preterm Abortions TAB SAB Ect Mult Living                  Review of Systems  All other systems reviewed and are negative.    Allergies  Cephalexin  Home Medications   Current Outpatient Rx  Name  Route  Sig  Dispense  Refill  . albuterol (PROVENTIL HFA;VENTOLIN HFA) 108 (90 BASE) MCG/ACT inhaler   Inhalation   Inhale 2 puffs into the lungs every 6 (six) hours as needed for wheezing or shortness of breath.          Marland Kitchen buPROPion (WELLBUTRIN XL) 150 MG 24 hr tablet   Oral   Take 150 mg by mouth daily.          . busPIRone (BUSPAR) 10 MG tablet   Oral   Take 10 mg by mouth every morning.         . Cholecalciferol 2000 UNITS CAPS   Oral   Take 2,000 Units by mouth daily.         . DULoxetine (CYMBALTA) 60 MG capsule   Oral   Take 60 mg by mouth daily.         Marland Kitchen lovastatin (MEVACOR) 20 MG tablet   Oral   Take 20 mg by mouth Daily.         . meclizine (ANTIVERT) 25 MG tablet   Oral   Take 25 mg by mouth daily as needed for dizziness or nausea.         Marland Kitchen omeprazole (PRILOSEC) 20 MG capsule   Oral   Take 1 capsule (20 mg total) by mouth daily.   180 capsule   0     **Patient requests 90 day supply**   . oxyCODONE-acetaminophen (PERCOCET/ROXICET) 5-325 MG per tablet  Oral   Take 1 tablet by mouth every 8 (eight) hours as needed for pain.         . traZODone (DESYREL) 150 MG tablet   Oral   Take 300 mg by mouth at bedtime.            BP 118/61  Pulse 121  Temp(Src) 98.5 F (36.9 C) (Oral)  Resp 17  SpO2 94%  Physical Exam  Nursing note and vitals reviewed. Constitutional: She is oriented to person, place, and time. She appears well-developed and well-nourished.  HENT:  Head: Normocephalic and atraumatic.  Eyes: Conjunctivae and EOM are normal. Pupils are equal, round, and reactive to light.  Neck: Normal range of motion. Neck supple.  Cardiovascular: Normal rate, regular rhythm and normal heart sounds.   Pulmonary/Chest: Effort normal and breath sounds normal.  Abdominal: Soft. Bowel sounds are normal.  Musculoskeletal: Normal range of motion.  Neurological: She is alert and oriented to person, place, and time.  Skin: Skin is warm and dry.  Psychiatric: She has a normal mood and affect.    ED Course  Procedures (including critical care time)  Labs Reviewed  CBC - Abnormal; Notable for the following:    WBC 11.0 (*)    All other components  within normal limits  BASIC METABOLIC PANEL - Abnormal; Notable for the following:    Glucose, Bld 114 (*)    GFR calc non Af Amer 65 (*)    GFR calc Af Amer 75 (*)    All other components within normal limits  PRO B NATRIURETIC PEPTIDE  TROPONIN I   Dg Chest Port 1 View  12/06/2012  *RADIOLOGY REPORT*  Clinical Data: Chest pain and shortness of breath.  Vomiting and dizziness.  PORTABLE CHEST - 1 VIEW  Comparison: Chest radiograph performed 02/12/2012, and CTA of the chest performed 05/21/2009  Findings: The lungs are hyperexpanded, with flattening of the hemidiaphragms, raising concern for underlying COPD.  A calcified granuloma is again noted at the left midlung zone.  Focal airspace opacity at the left lung base could reflect mild focal pneumonia, though mild focal scarring was noted in this location on the prior CTA from 2010.  The cardiomediastinal silhouette is within normal limits.  No acute osseous abnormalities are seen.  IMPRESSION:  1.  Focal airspace opacity at the left lung base could reflect mild focal pneumonia, though mild underlying focal scarring was seen at this location on the prior CTA from 2010. 2.  Findings of COPD.   Original Report Authenticated By: Tonia Ghent, M.D.      1. Chest pain       MDM  Negative workup. Patient looks good. She has primary care followup.  Nitroglycerin helps pain        Donnetta Hutching, MD 12/07/12 [phone]

## 2014-03-26 ENCOUNTER — Emergency Department (HOSPITAL_COMMUNITY): Payer: Medicare Other

## 2014-03-26 ENCOUNTER — Inpatient Hospital Stay (HOSPITAL_COMMUNITY)
Admission: EM | Admit: 2014-03-26 | Discharge: 2014-04-02 | DRG: 492 | Disposition: A | Discharge status: Home Health | Payer: Medicare Other | Attending: Orthopedic Surgery | Admitting: Orthopedic Surgery

## 2014-03-26 ENCOUNTER — Encounter (HOSPITAL_COMMUNITY): Payer: Self-pay | Admitting: Emergency Medicine

## 2014-03-26 DIAGNOSIS — I509 Heart failure, unspecified: Secondary | ICD-10-CM | POA: Diagnosis present

## 2014-03-26 DIAGNOSIS — E785 Hyperlipidemia, unspecified: Secondary | ICD-10-CM | POA: Diagnosis present

## 2014-03-26 DIAGNOSIS — W19XXXA Unspecified fall, initial encounter: Secondary | ICD-10-CM | POA: Diagnosis present

## 2014-03-26 DIAGNOSIS — F3289 Other specified depressive episodes: Secondary | ICD-10-CM | POA: Diagnosis present

## 2014-03-26 DIAGNOSIS — R339 Retention of urine, unspecified: Secondary | ICD-10-CM | POA: Diagnosis present

## 2014-03-26 DIAGNOSIS — Z9181 History of falling: Secondary | ICD-10-CM

## 2014-03-26 DIAGNOSIS — F329 Major depressive disorder, single episode, unspecified: Secondary | ICD-10-CM | POA: Diagnosis present

## 2014-03-26 DIAGNOSIS — F411 Generalized anxiety disorder: Secondary | ICD-10-CM | POA: Diagnosis present

## 2014-03-26 DIAGNOSIS — G252 Other specified forms of tremor: Secondary | ICD-10-CM | POA: Diagnosis present

## 2014-03-26 DIAGNOSIS — I10 Essential (primary) hypertension: Secondary | ICD-10-CM | POA: Diagnosis present

## 2014-03-26 DIAGNOSIS — S82201A Unspecified fracture of shaft of right tibia, initial encounter for closed fracture: Secondary | ICD-10-CM

## 2014-03-26 DIAGNOSIS — E538 Deficiency of other specified B group vitamins: Secondary | ICD-10-CM | POA: Diagnosis present

## 2014-03-26 DIAGNOSIS — R000 Tachycardia, unspecified: Secondary | ICD-10-CM

## 2014-03-26 DIAGNOSIS — J9600 Acute respiratory failure, unspecified whether with hypoxia or hypercapnia: Secondary | ICD-10-CM | POA: Diagnosis not present

## 2014-03-26 DIAGNOSIS — F17200 Nicotine dependence, unspecified, uncomplicated: Secondary | ICD-10-CM

## 2014-03-26 DIAGNOSIS — J449 Chronic obstructive pulmonary disease, unspecified: Secondary | ICD-10-CM | POA: Diagnosis present

## 2014-03-26 DIAGNOSIS — J9601 Acute respiratory failure with hypoxia: Secondary | ICD-10-CM

## 2014-03-26 DIAGNOSIS — S82401D Unspecified fracture of shaft of right fibula, subsequent encounter for closed fracture with routine healing: Secondary | ICD-10-CM

## 2014-03-26 DIAGNOSIS — R269 Unspecified abnormalities of gait and mobility: Secondary | ICD-10-CM

## 2014-03-26 DIAGNOSIS — I2510 Atherosclerotic heart disease of native coronary artery without angina pectoris: Secondary | ICD-10-CM | POA: Diagnosis present

## 2014-03-26 DIAGNOSIS — R259 Unspecified abnormal involuntary movements: Secondary | ICD-10-CM | POA: Diagnosis present

## 2014-03-26 DIAGNOSIS — Z881 Allergy status to other antibiotic agents status: Secondary | ICD-10-CM

## 2014-03-26 DIAGNOSIS — I5033 Acute on chronic diastolic (congestive) heart failure: Secondary | ICD-10-CM | POA: Diagnosis not present

## 2014-03-26 DIAGNOSIS — R2681 Unsteadiness on feet: Secondary | ICD-10-CM | POA: Diagnosis present

## 2014-03-26 DIAGNOSIS — D62 Acute posthemorrhagic anemia: Secondary | ICD-10-CM | POA: Diagnosis not present

## 2014-03-26 DIAGNOSIS — Z8249 Family history of ischemic heart disease and other diseases of the circulatory system: Secondary | ICD-10-CM | POA: Diagnosis not present

## 2014-03-26 DIAGNOSIS — I498 Other specified cardiac arrhythmias: Secondary | ICD-10-CM | POA: Diagnosis present

## 2014-03-26 DIAGNOSIS — Z91040 Latex allergy status: Secondary | ICD-10-CM

## 2014-03-26 DIAGNOSIS — G250 Essential tremor: Secondary | ICD-10-CM | POA: Diagnosis present

## 2014-03-26 DIAGNOSIS — Y92009 Unspecified place in unspecified non-institutional (private) residence as the place of occurrence of the external cause: Secondary | ICD-10-CM | POA: Diagnosis not present

## 2014-03-26 DIAGNOSIS — E039 Hypothyroidism, unspecified: Secondary | ICD-10-CM | POA: Diagnosis present

## 2014-03-26 DIAGNOSIS — S82201D Unspecified fracture of shaft of right tibia, subsequent encounter for closed fracture with routine healing: Secondary | ICD-10-CM

## 2014-03-26 DIAGNOSIS — J4489 Other specified chronic obstructive pulmonary disease: Secondary | ICD-10-CM

## 2014-03-26 DIAGNOSIS — S82209A Unspecified fracture of shaft of unspecified tibia, initial encounter for closed fracture: Secondary | ICD-10-CM | POA: Diagnosis not present

## 2014-03-26 DIAGNOSIS — M25579 Pain in unspecified ankle and joints of unspecified foot: Secondary | ICD-10-CM | POA: Diagnosis present

## 2014-03-26 DIAGNOSIS — R270 Ataxia, unspecified: Secondary | ICD-10-CM

## 2014-03-26 DIAGNOSIS — R279 Unspecified lack of coordination: Secondary | ICD-10-CM

## 2014-03-26 DIAGNOSIS — S82409A Unspecified fracture of shaft of unspecified fibula, initial encounter for closed fracture: Principal | ICD-10-CM

## 2014-03-26 HISTORY — DX: Chronic obstructive pulmonary disease, unspecified: J44.9

## 2014-03-26 HISTORY — DX: Headache: R51

## 2014-03-26 HISTORY — DX: Gastro-esophageal reflux disease without esophagitis: K21.9

## 2014-03-26 HISTORY — DX: Unspecified osteoarthritis, unspecified site: M19.90

## 2014-03-26 LAB — CBC WITH DIFFERENTIAL/PLATELET
BASOS PCT: 0 % (ref 0–1)
Basophils Absolute: 0 10*3/uL (ref 0.0–0.1)
EOS ABS: 0.1 10*3/uL (ref 0.0–0.7)
EOS PCT: 2 % (ref 0–5)
HEMATOCRIT: 40.9 % (ref 36.0–46.0)
HEMOGLOBIN: 13.4 g/dL (ref 12.0–15.0)
Lymphocytes Relative: 21 % (ref 12–46)
Lymphs Abs: 1.7 10*3/uL (ref 0.7–4.0)
MCH: 32.4 pg (ref 26.0–34.0)
MCHC: 32.8 g/dL (ref 30.0–36.0)
MCV: 99 fL (ref 78.0–100.0)
MONO ABS: 0.6 10*3/uL (ref 0.1–1.0)
MONOS PCT: 7 % (ref 3–12)
Neutro Abs: 5.8 10*3/uL (ref 1.7–7.7)
Neutrophils Relative %: 70 % (ref 43–77)
Platelets: 185 10*3/uL (ref 150–400)
RBC: 4.13 MIL/uL (ref 3.87–5.11)
RDW: 13.2 % (ref 11.5–15.5)
WBC: 8.1 10*3/uL (ref 4.0–10.5)

## 2014-03-26 LAB — BASIC METABOLIC PANEL
Anion gap: 12 (ref 5–15)
BUN: 11 mg/dL (ref 6–23)
CHLORIDE: 106 meq/L (ref 96–112)
CO2: 26 mEq/L (ref 19–32)
CREATININE: 0.88 mg/dL (ref 0.50–1.10)
Calcium: 9 mg/dL (ref 8.4–10.5)
GFR calc non Af Amer: 66 mL/min — ABNORMAL LOW (ref >90)
GFR, EST AFRICAN AMERICAN: 76 mL/min — AB (ref >90)
Glucose, Bld: 110 mg/dL — ABNORMAL HIGH (ref 70–99)
Potassium: 4.4 mEq/L (ref 3.7–5.3)
Sodium: 144 mEq/L (ref 137–147)

## 2014-03-26 MED ORDER — VITAMIN D3 25 MCG (1000 UNIT) PO TABS
2000.0000 [IU] | ORAL_TABLET | Freq: Every day | ORAL | Status: DC
  Administered 2014-03-27 – 2014-04-02 (×6): 2000 [IU] via ORAL
  Filled 2014-03-26 (×7): qty 2

## 2014-03-26 MED ORDER — PANTOPRAZOLE SODIUM 40 MG PO TBEC
40.0000 mg | DELAYED_RELEASE_TABLET | Freq: Every day | ORAL | Status: DC
  Administered 2014-03-27 – 2014-04-02 (×6): 40 mg via ORAL
  Filled 2014-03-26 (×6): qty 1

## 2014-03-26 MED ORDER — LEVOTHYROXINE SODIUM 25 MCG PO TABS
25.0000 ug | ORAL_TABLET | Freq: Every day | ORAL | Status: DC
  Administered 2014-03-27 – 2014-04-02 (×6): 25 ug via ORAL
  Filled 2014-03-26 (×8): qty 1

## 2014-03-26 MED ORDER — OXYCODONE-ACETAMINOPHEN 5-325 MG PO TABS
1.0000 | ORAL_TABLET | Freq: Three times a day (TID) | ORAL | Status: DC | PRN
  Administered 2014-03-26 – 2014-03-27 (×4): 2 via ORAL
  Filled 2014-03-26 (×4): qty 2

## 2014-03-26 MED ORDER — ENOXAPARIN SODIUM 40 MG/0.4ML ~~LOC~~ SOLN
40.0000 mg | SUBCUTANEOUS | Status: DC
  Administered 2014-03-26 – 2014-03-28 (×3): 40 mg via SUBCUTANEOUS
  Filled 2014-03-26 (×4): qty 0.4

## 2014-03-26 MED ORDER — MORPHINE SULFATE 2 MG/ML IJ SOLN
0.5000 mg | INTRAMUSCULAR | Status: DC | PRN
  Administered 2014-03-26 – 2014-03-28 (×4): 0.5 mg via INTRAVENOUS
  Filled 2014-03-26 (×4): qty 1

## 2014-03-26 MED ORDER — BUPROPION HCL ER (XL) 150 MG PO TB24
150.0000 mg | ORAL_TABLET | Freq: Every day | ORAL | Status: DC
  Administered 2014-03-27 – 2014-04-02 (×6): 150 mg via ORAL
  Filled 2014-03-26 (×7): qty 1

## 2014-03-26 MED ORDER — METHOCARBAMOL 500 MG PO TABS
500.0000 mg | ORAL_TABLET | Freq: Four times a day (QID) | ORAL | Status: DC | PRN
  Administered 2014-03-26 – 2014-03-28 (×6): 500 mg via ORAL
  Filled 2014-03-26 (×6): qty 1

## 2014-03-26 MED ORDER — SIMVASTATIN 10 MG PO TABS
10.0000 mg | ORAL_TABLET | Freq: Every day | ORAL | Status: DC
  Administered 2014-03-27 – 2014-04-01 (×5): 10 mg via ORAL
  Filled 2014-03-26 (×7): qty 1

## 2014-03-26 MED ORDER — ONDANSETRON HCL 4 MG PO TABS
4.0000 mg | ORAL_TABLET | Freq: Three times a day (TID) | ORAL | Status: DC | PRN
  Administered 2014-03-26: 4 mg via ORAL
  Filled 2014-03-26: qty 1

## 2014-03-26 MED ORDER — CLINDAMYCIN PHOSPHATE 900 MG/50ML IV SOLN
900.0000 mg | INTRAVENOUS | Status: AC
  Filled 2014-03-26: qty 50

## 2014-03-26 MED ORDER — BISACODYL 5 MG PO TBEC: 5.0000 mg | DELAYED_RELEASE_TABLET | Freq: Every day | ORAL | Status: DC | PRN

## 2014-03-26 MED ORDER — METHOCARBAMOL 1000 MG/10ML IJ SOLN
500.0000 mg | Freq: Four times a day (QID) | INTRAVENOUS | Status: DC | PRN
  Filled 2014-03-26: qty 5

## 2014-03-26 MED ORDER — MECLIZINE HCL 25 MG PO TABS
25.0000 mg | ORAL_TABLET | Freq: Every day | ORAL | Status: DC | PRN
  Filled 2014-03-26: qty 1

## 2014-03-26 MED ORDER — OXYCODONE HCL 5 MG PO TABS
5.0000 mg | ORAL_TABLET | ORAL | Status: DC | PRN
  Administered 2014-03-26 – 2014-03-28 (×5): 10 mg via ORAL
  Filled 2014-03-26 (×6): qty 2

## 2014-03-26 MED ORDER — DOCUSATE SODIUM 100 MG PO CAPS
100.0000 mg | ORAL_CAPSULE | Freq: Two times a day (BID) | ORAL | Status: DC
  Administered 2014-03-26 – 2014-03-29 (×7): 100 mg via ORAL
  Filled 2014-03-26 (×8): qty 1

## 2014-03-26 MED ORDER — ALPRAZOLAM 0.5 MG PO TABS
1.0000 mg | ORAL_TABLET | Freq: Two times a day (BID) | ORAL | Status: DC | PRN
  Administered 2014-03-27: 1 mg via ORAL
  Filled 2014-03-26: qty 2

## 2014-03-26 MED ORDER — ONDANSETRON HCL 4 MG/2ML IJ SOLN
4.0000 mg | Freq: Once | INTRAMUSCULAR | Status: AC
  Administered 2014-03-26: 4 mg via INTRAVENOUS
  Filled 2014-03-26: qty 2

## 2014-03-26 MED ORDER — BUSPIRONE HCL 10 MG PO TABS
10.0000 mg | ORAL_TABLET | Freq: Every morning | ORAL | Status: DC
  Administered 2014-03-27 – 2014-04-02 (×6): 10 mg via ORAL
  Filled 2014-03-26 (×7): qty 1

## 2014-03-26 MED ORDER — HYDROMORPHONE HCL PF 1 MG/ML IJ SOLN
1.0000 mg | Freq: Once | INTRAMUSCULAR | Status: AC
  Administered 2014-03-26: 1 mg via INTRAVENOUS
  Filled 2014-03-26: qty 1

## 2014-03-26 MED ORDER — DULOXETINE HCL 60 MG PO CPEP
60.0000 mg | ORAL_CAPSULE | Freq: Every day | ORAL | Status: DC
  Administered 2014-03-27 – 2014-04-02 (×6): 60 mg via ORAL
  Filled 2014-03-26 (×7): qty 1

## 2014-03-26 MED ORDER — TRAZODONE HCL 150 MG PO TABS
300.0000 mg | ORAL_TABLET | Freq: Every day | ORAL | Status: DC
  Administered 2014-03-26 – 2014-04-02 (×7): 300 mg via ORAL
  Filled 2014-03-26 (×8): qty 2

## 2014-03-26 MED ORDER — ALBUTEROL SULFATE (2.5 MG/3ML) 0.083% IN NEBU: 3.0000 mL | INHALATION_SOLUTION | Freq: Four times a day (QID) | RESPIRATORY_TRACT | Status: DC | PRN

## 2014-03-26 MED ORDER — MAGNESIUM CITRATE PO SOLN: 1.0000 | Freq: Once | ORAL | Status: AC | PRN

## 2014-03-26 NOTE — Consult Note (Signed)
Reason for Consult: Unsteady gait with recurrent falls.  HPI:                                                                                                                                          Traci [name] is an 68 y.o. female history hyperlipidemia, depression and anxiety, hypertension and COPD who was admitted today for acute fracture of right ankle associated with a recurrent fall. She's fallen 3 times within the last month. She has sustained injuries to her left hand as well as right shoulder with previous falls. She is on meclizine for symptoms of vertigo but has not been experiencing vertigo with recent falls. She has a walker as well as a cane but typically does not use any type of assistive device when ambulating.  Past Medical History  Diagnosis Date  . Hyperlipidemia   . Anxiety   . Depression   . Dyspepsia   . Hypertension   . Anginal pain   . History of bronchitis   . History of blood transfusion 1963    w/childbirth  . Shortness of breath 02/12/12    "all the time"  . H/O hiatal hernia   . Chronic lower back pain   . GERD (gastroesophageal reflux disease)   . COPD (chronic obstructive pulmonary disease)   . Headache(784.0)   . Arthritis     Past Surgical History  Procedure Laterality Date  . US echocardiography  12/17/2007    EF 55-60%  . Vaginal hysterectomy  1980's  . Cholecystectomy  2010  . Cardiac catheterization  04/19/1995    EF 65-70%  . Lumbar disc surgery  early 2000's  . Cataract extraction w/ intraocular lens  implant, bilateral  2012  . Back surgery      Family History  Problem Relation Age of Onset  . Heart attack Father     Social History:  reports that she has been smoking Cigarettes.  She has a 50 pack-year smoking history. She has never used smokeless tobacco. She reports that she does not drink alcohol or use illicit drugs.  Allergies  Allergen Reactions  . Cephalexin Hives  . Latex     " MAKES ME ITCHY "     MEDICATIONS:  I have reviewed the patient's current medications.   ROS:                                                                                                                                       History obtained from the patient  General ROS: negative for - chills, fatigue, fever, night sweats, weight gain or weight loss Psychological ROS: negative for - behavioral disorder, hallucinations, memory difficulties, mood swings or suicidal ideation Ophthalmic ROS: negative for - blurry vision, double vision, eye pain or loss of vision ENT ROS: negative for - epistaxis, nasal discharge, oral lesions, sore throat, tinnitus or vertigo Allergy and Immunology ROS: negative for - hives or itchy/watery eyes Hematological and Lymphatic ROS: negative for - bleeding problems, bruising or swollen lymph nodes Endocrine ROS: negative for - galactorrhea, hair pattern changes, polydipsia/polyuria or temperature intolerance Respiratory ROS: negative for - cough, hemoptysis, shortness of breath or wheezing Cardiovascular ROS: negative for - chest pain, dyspnea on exertion, edema or irregular heartbeat Gastrointestinal ROS: negative for - abdominal pain, diarrhea, hematemesis, nausea/vomiting or stool incontinence Genito-Urinary ROS: negative for - dysuria, hematuria, incontinence or urinary frequency/urgency Musculoskeletal ROS: negative for - joint swelling or muscular weakness Neurological ROS: as noted in HPI Dermatological ROS: negative for rash and skin lesion changes   Blood pressure 108/55, pulse 98, temperature 99.1 F (37.3 C), temperature source Oral, resp. rate 16, height 5' 10"  (1.778 m), weight 75.297 kg (166 lb), SpO2 96.00%.   Neurologic Examination:                                                                                                      Mental Status: Alert, oriented, in  moderate distress with right ankle pain.  Speech fluent without evidence of aphasia. Able to follow commands without difficulty. Cranial Nerves: II-Visual fields were normal. III/IV/VI-Pupils were equal and reacted. Extraocular movements were full and conjugate.    V/VII-no facial numbness and no facial weakness. VIII-normal. X-normal speech and symmetrical palatal movement. Motor: Normal strength proximally and distally of upper extremities and left lower extremity; normal strength proximally of right lower extremity. Muscle tone was flaccid throughout. No resting tremor was noted. Sensory: Normal throughout. Deep Tendon Reflexes: Trace to 1+ in upper extremities and knees. Left ankle reflexes absent Plantars: Mute on the left Cerebellar: Moderately severe intention tremor of both upper extremities with finger-to-nose testing.  Lab Results  Component Value Date/Time   CHOL 139 09/24/2011  9:26 AM  Results for orders placed during the hospital encounter of 03/26/14 (from the past 48 hour(s))  CBC WITH DIFFERENTIAL     Status: None   Collection Time    03/26/14 12:11 PM      Result Value Ref Range   WBC 8.1  4.0 - 10.5 K/uL   RBC 4.13  3.87 - 5.11 MIL/uL   Hemoglobin 13.4  12.0 - 15.0 g/dL   HCT 40.9  36.0 - 46.0 %   MCV 99.0  78.0 - 100.0 fL   MCH 32.4  26.0 - 34.0 pg   MCHC 32.8  30.0 - 36.0 g/dL   RDW 13.2  11.5 - 15.5 %   Platelets 185  150 - 400 K/uL   Neutrophils Relative % 70  43 - 77 %   Neutro Abs 5.8  1.7 - 7.7 K/uL   Lymphocytes Relative 21  12 - 46 %   Lymphs Abs 1.7  0.7 - 4.0 K/uL   Monocytes Relative 7  3 - 12 %   Monocytes Absolute 0.6  0.1 - 1.0 K/uL   Eosinophils Relative 2  0 - 5 %   Eosinophils Absolute 0.1  0.0 - 0.7 K/uL   Basophils Relative 0  0 - 1 %   Basophils Absolute 0.0  0.0 - 0.1 K/uL  BASIC METABOLIC PANEL     Status: Abnormal   Collection Time    03/26/14 12:11 PM      Result Value Ref Range   Sodium 144  137 - 147 mEq/L   Potassium 4.4   3.7 - 5.3 mEq/L   Chloride 106  96 - 112 mEq/L   CO2 26  19 - 32 mEq/L   Glucose, Bld 110 (*) 70 - 99 mg/dL   BUN 11  6 - 23 mg/dL   Creatinine, Ser 0.88  0.50 - 1.10 mg/dL   Calcium 9.0  8.4 - 10.5 mg/dL   GFR calc non Af Amer 66 (*) >90 mL/min   GFR calc Af Amer 76 (*) >90 mL/min   Comment: (NOTE)     The eGFR has been calculated using the CKD EPI equation.     This calculation has not been validated in all clinical situations.     eGFR's persistently <90 mL/min signify possible Chronic Kidney     Disease.   Anion gap 12  5 - 15    Dg Ankle Complete Right  03/26/2014   CLINICAL DATA:  Ankle pain secondary to a fall today.  EXAM: RIGHT ANKLE - COMPLETE 3+ VIEW  COMPARISON:  None.  FINDINGS: There are severely comminuted fractures of the distal shafts of tibia and fibula with some angulation and slight displacement. The fractures do not extend to the ankle joint. Diffuse osteopenia.  IMPRESSION: Comminuted fractures of the distal shafts of the tibia and fibula.   Electronically Signed   By: Rozetta Nunnery M.D.   On: 03/26/2014 11:34   Assessment/Plan: 68 year old lady presenting with recurrent falls and associated injury involving right ankle with fracture. Etiology for unstable gait is unclear. She has no signs of Parkinson's disease. There is no history of peripheral neuropathy, and patient has no frank clinical signs of peripheral neuropathy. There is a history of vertigo. However, patient denies symptoms of vertigo with recent falls.  Recommendations: 1. MRI of the brain without and with contrast. 2. Vitamin B12 and folate levels. 3. Physical therapy intervention as planned with rehabilitation following surgical management of right ankle fracture. 4. Minimize potential  place sedating medications, including analgesics.  We will continue to follow this patient with you.  C.R. Nicole Kindred, MD [facility] [phone]  03/26/2014, 8:41 PM

## 2014-03-26 NOTE — Progress Notes (Signed)
Orthopedic Tech Progress Note Patient Details:  Traci [name] 04/08/1946 [id number] SLS applied to RLE; care instructions explained. Ortho Devices Type of Ortho Device: Ace wrap;Short leg splint;Stirrup splint;Post (short leg) splint Ortho Device/Splint Location: RLE Ortho Device/Splint Interventions: Application   Asia R Thompson 03/26/2014, 3:38 PM

## 2014-03-26 NOTE — ED Notes (Signed)
Attempted report x1. 

## 2014-03-26 NOTE — Consult Note (Addendum)
[facility] Medical Consultation  KRISTIN BARCUS JKD:[id number] DOB: 1946/01/28 DOA: 03/26/2014 PCP: Bonnita Nasuti, MD   Requesting physician: Dr. Justice Britain Date of consultation: 03/26/2014 Reason for consultation: Evaluation of unsteady gait and frequent falls.  Impression/Recommendations Active Problems:   Fractured tibia and fibula    1. Ataxia and frequent falls: Unclear etiology. DD-tremors, CVA, polypharmacy including psychiatric medications and pain medications, vertigo, peripheral neuropathy vs others. Does not seem parkinsonian in nature. Unable to test for orthostatic hypotension or gait secondary to right ankle fracture. Patient apparently has been evaluated to an extent by PCP and? Neurology-details not available. Will request neuro hospitalist to evaluate in house given disability from frequent falls. Will defer MRI brain until neurology evaluation. Will need tapering/minimizing doses of psychotropic and narcotic pain medications. PT & OT evaluation. 2. Tobacco abuse: Cessation counseled. Patient declines nicotine patch. 3. COPD: Stable. Tobacco cessation counseled. Continue when necessary bronchodilators. 4. History of hyperlipidemia: Continue statins. 5. History of anxiety & depression: Continue home medications (alprazolam Wellbutrin and BuSpar) but will need psychiatry followup as outpatient to manage these appropriately. Currently does not see a psychiatrist. 6. History of hypothyroidism: Check Synthroid. 7. History of CAD: Nonobstructive disease by left heart cath in 2013.  TR[facility] will followup again tomorrow. Please contact me if I can be of assistance in the meanwhile. Thank you for this consultation.  Chief Complaint: right ankle pain following another fall  HPI:  68 year old female patient with history of CAD, ongoing tobacco abuse, COPD, anxiety & depression, GERD, HLD, atypical chest pain evaluated by left heart cardiac catheterization on 02/13/12 which showed  nonobstructive CAD and EF 60-70%, chronic low back and right hip pain for which she takes narcotic pain medications has been admitted by the orthopedic service for evaluation and management of right ankle fracture sustained after another fall. TR[facility] were requested to assist with evaluation of her unsteady gait and frequent falls. Patient does not good historian. According to patient's granddaughter, the patient has had unsteady gait for at least 7-8 years but has gotten worse in the last 1 year. She states that the patient may have fallen at least 20 times in the last year. According to granddaughter, someone has to hold her while she is walking due to her unsteady gait. She intermittently has "shaking" of her body. Patient states that her PCP has diagnosed her with vertigo. He also indicates that she has seen a neurologist and was told that "nothing was wrong". She denies dizziness, lightheadedness, headache, chest pain or palpitations prior to fall. She occasionally has vertigo but not preceding all falls. She denies LOC. She denies weakness of her proximal muscles. She denies tingling or numbness.  Review of Systems:  All systems reviewed and apart from history of presenting illness, are negative.  Past Medical History  Diagnosis Date  . Hyperlipidemia   . Anxiety   . Depression   . Dyspepsia   . Hypertension   . Anginal pain   . History of bronchitis   . History of blood transfusion 1963    w/childbirth  . Shortness of breath 02/12/12    "all the time"  . H/O hiatal hernia   . Chronic lower back pain   . GERD (gastroesophageal reflux disease)   . COPD (chronic obstructive pulmonary disease)   . Headache(784.0)   . Arthritis    Past Surgical History  Procedure Laterality Date  . US echocardiography  12/17/2007    EF 55-60%  . Vaginal hysterectomy  1980's  . Cholecystectomy  2010  . Cardiac catheterization  04/19/1995    EF 65-70%  . Lumbar disc surgery  early 2000's  . Cataract  extraction w/ intraocular lens  implant, bilateral  2012  . Back surgery     Social History:  reports that she has been smoking Cigarettes.  She has a 50 pack-year smoking history. She has never used smokeless tobacco. She reports that she does not drink alcohol or use illicit drugs. Married. Lives with spouse. Apparently independent of activities of daily living. She does have a walker prescribed to her after back surgery 10 years ago but she does not use it. Smokes a pack of cigarettes per day. Allergies  Allergen Reactions  . Cephalexin Hives  . Latex     " MAKES ME ITCHY "    Family History  Problem Relation Age of Onset  . Heart attack Father     Prior to Admission medications   Medication Sig Start Date End Date Taking? Authorizing Provider  albuterol (PROVENTIL HFA;VENTOLIN HFA) 108 (90 BASE) MCG/ACT inhaler Inhale 2 puffs into the lungs every 6 (six) hours as needed for wheezing or shortness of breath.   Yes Historical Provider, MD  ALPRAZolam Duanne Moron) 1 MG tablet Take 1 mg by mouth 2 (two) times daily as needed for anxiety.  02/10/14  Yes Historical Provider, MD  buPROPion (WELLBUTRIN XL) 150 MG 24 hr tablet Take 150 mg by mouth daily.    Yes Historical Provider, MD  busPIRone (BUSPAR) 10 MG tablet Take 10 mg by mouth every morning.   Yes Historical Provider, MD  Cholecalciferol 2000 UNITS CAPS Take 2,000 Units by mouth daily.   Yes Historical Provider, MD  DULoxetine (CYMBALTA) 60 MG capsule Take 60 mg by mouth daily.   Yes Historical Provider, MD  levothyroxine (SYNTHROID, LEVOTHROID) 25 MCG tablet Take 25 mcg by mouth daily before breakfast.  03/12/14  Yes Historical Provider, MD  lovastatin (MEVACOR) 20 MG tablet Take 20 mg by mouth Daily. 02/12/12  Yes Historical Provider, MD  meclizine (ANTIVERT) 25 MG tablet Take 25 mg by mouth daily as needed for dizziness or nausea.   Yes Historical Provider, MD  meloxicam (MOBIC) 15 MG tablet Take 15 mg by mouth daily.  02/08/14  Yes  Historical Provider, MD  omeprazole (PRILOSEC) 20 MG capsule Take 1 capsule (20 mg total) by mouth daily. 09/25/11  Yes Darlin Coco, MD  oxyCODONE-acetaminophen (PERCOCET/ROXICET) 5-325 MG per tablet Take 1 tablet by mouth every 8 (eight) hours as needed for pain.   Yes Historical Provider, MD  traZODone (DESYREL) 150 MG tablet Take 300 mg by mouth at bedtime.    Yes Historical Provider, MD   Physical Exam: Blood pressure 105/81, pulse 91, temperature 98 F (36.7 C), temperature source Oral, resp. rate 16, height 5\' 10"  (1.778 m), weight 75.297 kg (166 lb), SpO2 98.00%. Filed Vitals:   03/26/14 1640  BP: 105/81  Pulse: 91  Temp: 98 F (36.7 C)  Resp: 16     General:  Moderately built and frail middle-aged female who looks older than stated age lying comfortably supine in bed.  Eyes: Pupils equally reacting to light and accommodation. Bilateral intraocular lens present. No nystagmus.  ENT: Oral mucosa moist. Otherwise no acute findings.  Neck: Supple. No JVD, carotid bruit or thyromegaly.  Cardiovascular: S1 and S2 heard, RRR. No JVD, murmurs or pedal edema.  Respiratory: Clear to auscultation. No increased work of breathing.  Abdomen: Nondistended, soft and nontender.  Normal bowel sounds heard.  Skin: Dry but without acute findings.  Musculoskeletal: No acute findings.  Psychiatric: Appears slightly anxious but pleasant and cooperative.  Neurologic: Alert and oriented x4. No cranial nerve deficits. Tremors of upper extremities left greater than right,? intension tremors.? Past-pointing and abnormal finger-nose testing on left side. Unable to stand patient up or assess gait secondary to right ankle fracture.  Extremities: Intermittent tremors noted left upper extremity greater than right upper extremity-? Intension tremors. Right leg/foot foot and ankle in cast. Grade 5 x 5 power in bilateral upper extremities and left lower extremities. Right lower extremity power  assessment Limited by pain.  Labs on Admission:  Basic Metabolic Panel:  Recent Labs Lab 03/26/14 1211  NA 144  K 4.4  CL 106  CO2 26  GLUCOSE 110*  BUN 11  CREATININE 0.88  CALCIUM 9.0   Liver Function Tests: No results found for this basename: AST, ALT, ALKPHOS, BILITOT, PROT, ALBUMIN,  in the last 168 hours No results found for this basename: LIPASE, AMYLASE,  in the last 168 hours No results found for this basename: AMMONIA,  in the last 168 hours CBC:  Recent Labs Lab 03/26/14 1211  WBC 8.1  NEUTROABS 5.8  HGB 13.4  HCT 40.9  MCV 99.0  PLT 185   Cardiac Enzymes: No results found for this basename: CKTOTAL, CKMB, CKMBINDEX, TROPONINI,  in the last 168 hours BNP: No components found with this basename: POCBNP,  CBG: No results found for this basename: GLUCAP,  in the last 168 hours  Radiological Exams on Admission: Dg Ankle Complete Right  03/26/2014   CLINICAL DATA:  Ankle pain secondary to a fall today.  EXAM: RIGHT ANKLE - COMPLETE 3+ VIEW  COMPARISON:  None.  FINDINGS: There are severely comminuted fractures of the distal shafts of tibia and fibula with some angulation and slight displacement. The fractures do not extend to the ankle joint. Diffuse osteopenia.  IMPRESSION: Comminuted fractures of the distal shafts of the tibia and fibula.   Electronically Signed   By: Rozetta Nunnery M.D.   On: 03/26/2014 11:34      Time spent: 60 minutes  [facility] Pager [phone]  If 7PM-7AM, please contact night-coverage [email or web address] Password [facility] 03/26/2014, 6:57 PM

## 2014-03-26 NOTE — ED Notes (Signed)
Per EMS, patient was home and "her ankle rolled" while patient was in bathroom.  Patient states she fell to the ground.   Patient denies hitting head or having any LOC.   Patient was found with obvious deformity to R ankle.   Per EMS, ankle was rotated outwards and down.   Patient had 250 Fentanyl IV prior to arrival, 4 mg Zofran IV prior to arrival.   20 G L AC placed prior to arrival.

## 2014-03-26 NOTE — ED Provider Notes (Signed)
CSN: [id number]     Arrival date & time 03/26/14  36 History   First MD Initiated Contact with Patient 03/26/14 1055     Chief Complaint  Patient presents with  . Fall  . Ankle Pain     (Consider location/radiation/quality/duration/timing/severity/associated sxs/prior Treatment) HPI Comments: Patient presenting with a chief complaint of right ankle pain.  She reports that just prior to arrival she got up from the commode and felt very weak.  She then rolled her ankle and fell to the ground.  She denies hitting her head or LOC.  She was given 125 mg IV Fentanyl by EMS en route, which she reports gave her mild temporary improvement in pain.  She states that the weakness she felt today has been occurring intermittently for years.  She states that she has seen Neurology in the past for this.  She reports that she was told by Neurology that she has Vertigo.  She denies any dizziness or changes in vision associated with her fall today.  She denies any numbness or tingling of the foot.  She denies any focal weakness.  She denies headache, nausea, vomiting, or vision changes.  She denies any pain aside from the right ankle at this time.    The history is provided by the patient.    Past Medical History  Diagnosis Date  . Hyperlipidemia   . Anxiety   . Depression   . Dyspepsia   . Hypertension   . Anginal pain   . History of bronchitis   . History of blood transfusion 1963    w/childbirth  . Shortness of breath 02/12/12    "all the time"  . H/O hiatal hernia   . Chronic lower back pain    Past Surgical History  Procedure Laterality Date  . US echocardiography  12/17/2007    EF 55-60%  . Vaginal hysterectomy  1980's  . Cholecystectomy  2010  . Cardiac catheterization  04/19/1995    EF 65-70%  . Lumbar disc surgery  early 2000's  . Cataract extraction w/ intraocular lens  implant, bilateral  2012  . Back surgery     Family History  Problem Relation Age of Onset  . Heart attack Father     History  Substance Use Topics  . Smoking status: Current Every Day Smoker -- 1.00 packs/day for 50 years    Types: Cigarettes  . Smokeless tobacco: Never Used  . Alcohol Use: No   OB History   Grav Para Term Preterm Abortions TAB SAB Ect Mult Living                 Review of Systems  All other systems reviewed and are negative.     Allergies  Cephalexin  Home Medications   Prior to Admission medications   Medication Sig Start Date End Date Taking? Authorizing Provider  albuterol (PROVENTIL HFA;VENTOLIN HFA) 108 (90 BASE) MCG/ACT inhaler Inhale 2 puffs into the lungs every 6 (six) hours as needed for wheezing or shortness of breath.    Historical Provider, MD  buPROPion (WELLBUTRIN XL) 150 MG 24 hr tablet Take 150 mg by mouth daily.     Historical Provider, MD  busPIRone (BUSPAR) 10 MG tablet Take 10 mg by mouth every morning.    Historical Provider, MD  Cholecalciferol 2000 UNITS CAPS Take 2,000 Units by mouth daily.    Historical Provider, MD  DULoxetine (CYMBALTA) 60 MG capsule Take 60 mg by mouth daily.    Historical Provider,  MD  lovastatin (MEVACOR) 20 MG tablet Take 20 mg by mouth Daily. 02/12/12   Historical Provider, MD  meclizine (ANTIVERT) 25 MG tablet Take 25 mg by mouth daily as needed for dizziness or nausea.    Historical Provider, MD  omeprazole (PRILOSEC) 20 MG capsule Take 1 capsule (20 mg total) by mouth daily. 09/25/11   Darlin Coco, MD  oxyCODONE-acetaminophen (PERCOCET/ROXICET) 5-325 MG per tablet Take 1 tablet by mouth every 8 (eight) hours as needed for pain.    Historical Provider, MD  traZODone (DESYREL) 150 MG tablet Take 300 mg by mouth at bedtime.     Historical Provider, MD   BP 134/75  Pulse 103  Temp(Src) 98 F (36.7 C) (Oral)  Resp 20  Ht 5\' 10"  (1.778 m)  Wt 166 lb (75.297 kg)  BMI 23.82 kg/m2  SpO2 97% Physical Exam  Nursing note and vitals reviewed. Constitutional: She appears well-developed and well-nourished.  HENT:  Head:  Normocephalic and atraumatic.  Mouth/Throat: Oropharynx is clear and moist.  Eyes: EOM are normal. Pupils are equal, round, and reactive to light.  Neck: Normal range of motion. Neck supple.  Cardiovascular: Normal rate, regular rhythm and normal heart sounds.   Pulses:      Dorsalis pedis pulses are 2+ on the right side, and 2+ on the left side.  Pulmonary/Chest: Effort normal and breath sounds normal. She exhibits no tenderness.  Abdominal: Soft. There is no tenderness.  Musculoskeletal:       Right ankle: She exhibits decreased range of motion, swelling, ecchymosis and deformity. Tenderness. Lateral malleolus tenderness found.       Cervical back: She exhibits normal range of motion, no tenderness, no bony tenderness, no swelling, no edema and no deformity.       Thoracic back: She exhibits normal range of motion and no tenderness.  Neurological: She is alert. She has normal strength. No cranial nerve deficit or sensory deficit.  Distal sensation of the right toes is intact Did not assess gait due to the right ankle fracture  Skin: Skin is warm and dry.  Small very superficial abrasion over the lateral malleolus.  No laceration.    Psychiatric: She has a normal mood and affect.    ED Course  Procedures (including critical care time) Labs Review Labs Reviewed  CBC WITH DIFFERENTIAL  BASIC METABOLIC PANEL    Imaging Review Dg Ankle Complete Right  03/26/2014   CLINICAL DATA:  Ankle pain secondary to a fall today.  EXAM: RIGHT ANKLE - COMPLETE 3+ VIEW  COMPARISON:  None.  FINDINGS: There are severely comminuted fractures of the distal shafts of tibia and fibula with some angulation and slight displacement. The fractures do not extend to the ankle joint. Diffuse osteopenia.  IMPRESSION: Comminuted fractures of the distal shafts of the tibia and fibula.   Electronically Signed   By: Rozetta Nunnery M.D.   On: 03/26/2014 11:34     EKG Interpretation None     1:11 PM Repaged Orthopedic  Surgery. 1:51 PM Discussed with Dr. Onnie Graham with Orthopedic Surgery.  He reports that he will look at the xray and call back. 3:00 PM Discussed with Dr. Onnie Graham with Orthopedic Surgery.  He states that the patient can be admitted to Orthopedics.  He will come see the patient.  He recommends placing her in a padded stirrup splint.   MDM   Final diagnoses:  None   Patient presenting today with right ankle pain after a fall.  She denies  any other pain.  Denies hitting her head or LOC.  Xray of the right ankle showing comminuted fracture of the distal shaft of the tibia and fibula.  Fracture is a closed fracture.  Patient is neurovascularly intact.  Patient discussed with Dr. Onnie Graham with Orthopedics.  He has agreed to admit the patient.  Patient reports that weakness caused the fall.  She has no focal weakness on exam.  Normal neurological exam.  Labs unremarkable.  VSS.  She states that the weakness has been present for years.      Hyman Bible, PA-C 03/28/14 2252

## 2014-03-26 NOTE — Progress Notes (Signed)
Orthopedic Tech Progress Note Patient Details:  Traci [name] 1945-12-29 [id number]  Ortho Devices Type of Ortho Device: Ace wrap;Short leg splint;Stirrup splint;Post (short leg) splint Ortho Device/Splint Location: trapeze bar patient helper Ortho Device/Splint Interventions: Application   [name] 03/26/2014, 7:59 PM

## 2014-03-26 NOTE — H&P (Signed)
Traci [name]    Chief Complaint: right leg pain HPI: The patient is a 68 y.o. female presents to ED by way of EMS today after falling at home and twisting ankle with immediate deformity and inability to bear weight. Patient denies LOC but does report "frequent falls" and family confirms that she has a very unsteady gait which has been evaluated to a certain extent by her primary physician as well as a neurologist although no diagnosis has been provided and the history provided by patient and family is somewhat unreliable regarding the timing and extent of previous workups.  Past Medical History  Diagnosis Date  . Hyperlipidemia   . Anxiety   . Depression   . Dyspepsia   . Hypertension   . Anginal pain   . History of bronchitis   . History of blood transfusion 1963    w/childbirth  . Shortness of breath 02/12/12    "all the time"  . H/O hiatal hernia   . Chronic lower back pain   . GERD (gastroesophageal reflux disease)   . COPD (chronic obstructive pulmonary disease)   . Headache(784.0)   . Arthritis     Past Surgical History  Procedure Laterality Date  . US echocardiography  12/17/2007    EF 55-60%  . Vaginal hysterectomy  1980's  . Cholecystectomy  2010  . Cardiac catheterization  04/19/1995    EF 65-70%  . Lumbar disc surgery  early 2000's  . Cataract extraction w/ intraocular lens  implant, bilateral  2012  . Back surgery      Family History  Problem Relation Age of Onset  . Heart attack Father     Social History:  reports that she has been smoking Cigarettes.  She has a 50 pack-year smoking history. She has never used smokeless tobacco. She reports that she does not drink alcohol or use illicit drugs.  Allergies:  Allergies  Allergen Reactions  . Cephalexin Hives  . Latex     " MAKES ME ITCHY "     Medications Prior to Admission  Medication Sig Dispense Refill  . albuterol (PROVENTIL HFA;VENTOLIN HFA) 108 (90 BASE) MCG/ACT inhaler Inhale 2 puffs into the  lungs every 6 (six) hours as needed for wheezing or shortness of breath.      . ALPRAZolam (XANAX) 1 MG tablet Take 1 mg by mouth 2 (two) times daily as needed for anxiety.       Marland Kitchen buPROPion (WELLBUTRIN XL) 150 MG 24 hr tablet Take 150 mg by mouth daily.       . busPIRone (BUSPAR) 10 MG tablet Take 10 mg by mouth every morning.      . Cholecalciferol 2000 UNITS CAPS Take 2,000 Units by mouth daily.      . DULoxetine (CYMBALTA) 60 MG capsule Take 60 mg by mouth daily.      Marland Kitchen levothyroxine (SYNTHROID, LEVOTHROID) 25 MCG tablet Take 25 mcg by mouth daily before breakfast.       . lovastatin (MEVACOR) 20 MG tablet Take 20 mg by mouth Daily.      . meclizine (ANTIVERT) 25 MG tablet Take 25 mg by mouth daily as needed for dizziness or nausea.      . meloxicam (MOBIC) 15 MG tablet Take 15 mg by mouth daily.       Marland Kitchen omeprazole (PRILOSEC) 20 MG capsule Take 1 capsule (20 mg total) by mouth daily.  180 capsule  0  . oxyCODONE-acetaminophen (PERCOCET/ROXICET) 5-325 MG per tablet Take  1 tablet by mouth every 8 (eight) hours as needed for pain.      . traZODone (DESYREL) 150 MG tablet Take 300 mg by mouth at bedtime.          Physical Exam: thin WF in obvious discomfort secondary to right leg pain. [place]/AT, EOMI, neck Arnie [place], no tenderness with palpation about the shoulder and upper extreme ties. Grossly N/V intact UE's. LLE no gross bone or joint instability. RLE with well padded splint applied to leg and ankle, limited digital motion secondary to pain, brisk capillary refill. Report from ED staff was that skin was intact about the leg and ankle  xray Right tibia and fibula with comminuted but minimally displaced fractures of the distal shafts, overall good alignment and extraarticular. Vitals  Temp:  [98 F (36.7 C)] 98 F (36.7 C) (08/07 1640) Pulse Rate:  [86-103] 91 (08/07 1640) Resp:  [16-20] 16 (08/07 1640) BP: (104-134)/(36-81) 105/81 mmHg (08/07 1640) SpO2:  [96 %-100 %] 98 % (08/07  1640) Weight:  [75.297 kg (166 lb)] 75.297 kg (166 lb) (08/07 1053)  Assessment/Plan  Impression:  1 comminuted right distal tib/fib fractures. These will require surgical stabilization, and I have spoken with my partner Dr. Doran Durand, a specialist in these injuries, who will be available to provide definitive surgical treatment this coming Tuesday. Admit for pain control 2 Ataxia with increasing falls. Will contact hospitalist service for evaluation.    Cresencio Reesor M 03/26/2014, 6:00 PM

## 2014-03-27 ENCOUNTER — Inpatient Hospital Stay (HOSPITAL_COMMUNITY): Payer: Medicare Other

## 2014-03-27 LAB — VITAMIN B12: Vitamin B-12: 228 pg/mL (ref 211–911)

## 2014-03-27 LAB — TSH: TSH: 1.53 u[IU]/mL (ref 0.350–4.500)

## 2014-03-27 MED ORDER — LORAZEPAM 2 MG/ML IJ SOLN: 0.5000 mg | Freq: Once | INTRAMUSCULAR | Status: DC

## 2014-03-27 NOTE — Progress Notes (Addendum)
[facility] PROGRESS NOTE  Traci [name]:[id number] DOB: 04-17-1946 DOA: 03/26/2014 PCP: Bonnita Nasuti, MD  Assessment/Plan: Active Problems:   Fractured tibia and fibula   Right tibial fracture   Closed right tibial fracture   Unstable gait    Recurrent falls/unstable gait No signs of parkinsonism or peripheral neuropathy Neurology consulted They recommend MRI of the brain Vitamin B12 and folate levels PT eval Minimize sedating medications  comminuted right distal tib/fib fractures Patient anticipate surgery on Tuesday DVT prophylaxis per orthopedics  Tobacco abuse: Cessation counseled. Patient declines nicotine patch. COPD: Stable. Tobacco cessation counseled. Continue when necessary bronchodilators. History of hyperlipidemia: Continue statins. History of anxiety & depression: Continue home medications (alprazolam Wellbutrin and BuSpar) but will need psychiatry followup as outpatient to manage these appropriately. Currently does not see a psychiatrist. History of hypothyroidism: Continue Synthroid., Check TSH History of CAD: Nonobstructive disease by left heart cath in 2013.     Code Status: full Family Communication: family updated about patient's clinical progress Disposition Plan:  As above    Brief narrative: Traci [name] is an 68 y.o. female history hyperlipidemia, depression and anxiety, hypertension and COPD who was admitted today for acute fracture of right ankle associated with a recurrent fall. She's fallen 3 times within the last month. She has sustained injuries to her left hand as well as right shoulder with previous falls. She is on meclizine for symptoms of vertigo but has not been experiencing vertigo with recent falls. She has a walker as well as a cane but typically does not use any type of assistive device when ambulating.   Consultants:  Neurology  Procedures:  None  Antibiotics:  None  HPI/Subjective: Complaining of ankle  pain  Objective: Filed Vitals:   03/26/14 1545 03/26/14 1640 03/26/14 2014 03/27/14 0503  BP: 132/69 105/81 108/55 96/51  Pulse: 88 91 98 102  Temp:  98 F (36.7 C) 99.1 F (37.3 C) 98.3 F (36.8 C)  TempSrc:   Oral Oral  Resp:  16 16 18   Height:      Weight:      SpO2: 99% 98% 96% 95%    Intake/Output Summary (Last 24 hours) at 03/27/14 1003 Last data filed at 03/27/14 0856  Gross per 24 hour  Intake    240 ml  Output      0 ml  Net    240 ml    Exam:  General: alert & oriented x 3 In NAD  Cardiovascular: RRR, nl S1 s2  Respiratory: Decreased breath sounds at the bases, scattered rhonchi, no crackles  Abdomen: soft +BS NT/ND, no masses palpable  Extremities: No cyanosis and no edema      Data Reviewed: Basic Metabolic Panel:  Recent Labs Lab 03/26/14 1211  NA 144  K 4.4  CL 106  CO2 26  GLUCOSE 110*  BUN 11  CREATININE 0.88  CALCIUM 9.0    Liver Function Tests: No results found for this basename: AST, ALT, ALKPHOS, BILITOT, PROT, ALBUMIN,  in the last 168 hours No results found for this basename: LIPASE, AMYLASE,  in the last 168 hours No results found for this basename: AMMONIA,  in the last 168 hours  CBC:  Recent Labs Lab 03/26/14 1211  WBC 8.1  NEUTROABS 5.8  HGB 13.4  HCT 40.9  MCV 99.0  PLT 185    Cardiac Enzymes: No results found for this basename: CKTOTAL, CKMB, CKMBINDEX, TROPONINI,  in the last 168 hours BNP (last 3  results) No results found for this basename: PROBNP,  in the last 8760 hours   CBG: No results found for this basename: GLUCAP,  in the last 168 hours  No results found for this or any previous visit (from the past 240 hour(s)).   Studies: Dg Ankle Complete Right  03/26/2014   CLINICAL DATA:  Ankle pain secondary to a fall today.  EXAM: RIGHT ANKLE - COMPLETE 3+ VIEW  COMPARISON:  None.  FINDINGS: There are severely comminuted fractures of the distal shafts of tibia and fibula with some angulation and slight  displacement. The fractures do not extend to the ankle joint. Diffuse osteopenia.  IMPRESSION: Comminuted fractures of the distal shafts of the tibia and fibula.   Electronically Signed   By: Rozetta Nunnery M.D.   On: 03/26/2014 11:34    Scheduled Meds: . buPROPion  150 mg Oral Daily  . busPIRone  10 mg Oral q morning - 10a  . cholecalciferol  2,000 Units Oral Daily  . clindamycin (CLEOCIN) IV  900 mg Intravenous On Call to OR  . docusate sodium  100 mg Oral BID  . DULoxetine  60 mg Oral Daily  . enoxaparin (LOVENOX) injection  40 mg Subcutaneous Q24H  . levothyroxine  25 mcg Oral QAC breakfast  . pantoprazole  40 mg Oral Daily  . simvastatin  10 mg Oral q1800  . traZODone  300 mg Oral QHS   Continuous Infusions:   Active Problems:   Fractured tibia and fibula   Right tibial fracture   Closed right tibial fracture   Unstable gait    Time spent: 40 minutes   [facility] Pager [phone]. If 8PM-8AM, please contact night-coverage at [email or web address], password [facility] 03/27/2014, 10:03 AM  LOS: 1 day

## 2014-03-27 NOTE — Progress Notes (Signed)
Patient unable to complete MRI due to unrelieved pain and muscle spasms. One time Ativan dose rescheduled for tomorrow AM when MRI will be re-attempted.

## 2014-03-27 NOTE — Care Management Note (Signed)
CARE MANAGEMENT NOTE 03/27/2014  Patient:  Traci [name], Traci [name]   Account Number:  [id number]  Date Initiated:  03/27/2014  Documentation initiated by:  Ricki Miller  Subjective/Objective Assessment:   68 yr old female admitted s/p fall with right tib/fib fracture.     Action/Plan:   Patient to have surgery 03/30/14.Admitted for pain control. Case manager will continue to monitor for discharge needs.   Anticipated DC Date:     Anticipated DC Plan:           Choice offered to / List presented to:             Status of service:  In process, will continue to follow

## 2014-03-28 LAB — COMPREHENSIVE METABOLIC PANEL
ALBUMIN: 3.1 g/dL — AB (ref 3.5–5.2)
ALK PHOS: 50 U/L (ref 39–117)
ALT: 10 U/L (ref 0–35)
AST: 14 U/L (ref 0–37)
Anion gap: 11 (ref 5–15)
BILIRUBIN TOTAL: 0.7 mg/dL (ref 0.3–1.2)
BUN: 11 mg/dL (ref 6–23)
CO2: 29 mEq/L (ref 19–32)
Calcium: 8.7 mg/dL (ref 8.4–10.5)
Chloride: 100 mEq/L (ref 96–112)
Creatinine, Ser: 0.88 mg/dL (ref 0.50–1.10)
GFR calc Af Amer: 76 mL/min — ABNORMAL LOW (ref >90)
GFR calc non Af Amer: 66 mL/min — ABNORMAL LOW (ref >90)
GLUCOSE: 102 mg/dL — AB (ref 70–99)
POTASSIUM: 4.4 meq/L (ref 3.7–5.3)
SODIUM: 140 meq/L (ref 137–147)
Total Protein: 6.3 g/dL (ref 6.0–8.3)

## 2014-03-28 LAB — CBC
HEMATOCRIT: 36 % (ref 36.0–46.0)
Hemoglobin: 11.6 g/dL — ABNORMAL LOW (ref 12.0–15.0)
MCH: 32.2 pg (ref 26.0–34.0)
MCHC: 32.2 g/dL (ref 30.0–36.0)
MCV: 100 fL (ref 78.0–100.0)
Platelets: 164 10*3/uL (ref 150–400)
RBC: 3.6 MIL/uL — ABNORMAL LOW (ref 3.87–5.11)
RDW: 13.2 % (ref 11.5–15.5)
WBC: 8.4 10*3/uL (ref 4.0–10.5)

## 2014-03-28 LAB — HEMOGLOBIN A1C
Hgb A1c MFr Bld: 5.3 % (ref <5.7)
MEAN PLASMA GLUCOSE: 105 mg/dL (ref <117)

## 2014-03-28 LAB — T4, FREE: Free T4: 1.32 ng/dL (ref 0.80–1.80)

## 2014-03-28 MED ORDER — MORPHINE SULFATE 2 MG/ML IJ SOLN: 1.0000 mg | INTRAMUSCULAR | Status: DC | PRN

## 2014-03-28 MED ORDER — ALPRAZOLAM 0.5 MG PO TABS
0.5000 mg | ORAL_TABLET | Freq: Two times a day (BID) | ORAL | Status: DC | PRN
  Administered 2014-03-29 – 2014-04-02 (×2): 0.5 mg via ORAL
  Filled 2014-03-28 (×2): qty 1

## 2014-03-28 MED ORDER — CYANOCOBALAMIN 1000 MCG/ML IJ SOLN
1000.0000 ug | Freq: Once | INTRAMUSCULAR | Status: AC
  Administered 2014-03-28: 1000 ug via INTRAMUSCULAR
  Filled 2014-03-28: qty 1

## 2014-03-28 MED ORDER — MORPHINE SULFATE 2 MG/ML IJ SOLN
0.5000 mg | INTRAMUSCULAR | Status: DC | PRN
  Administered 2014-03-29 – 2014-03-30 (×2): 0.5 mg via INTRAVENOUS
  Filled 2014-03-28 (×2): qty 1

## 2014-03-28 MED ORDER — OXYCODONE HCL 5 MG PO TABS
5.0000 mg | ORAL_TABLET | Freq: Four times a day (QID) | ORAL | Status: DC | PRN
  Administered 2014-03-28 – 2014-04-02 (×13): 5 mg via ORAL
  Filled 2014-03-28 (×13): qty 1

## 2014-03-28 NOTE — Progress Notes (Signed)
Subjective: No significant cahgnes, falss progressing aover a couple of years, also longstanding tremor.   Exam: Filed Vitals:   03/28/14 1322  BP: 97/40  Pulse:   Temp: 98.2 F (36.8 C)  Resp: 18   Gen: In bed, NAD MS: Awake, alert WV:PXTGG, EOMI Motor: 5/5, but significant intentional tremor bilaterally.  Sensory:decreased to vibration to the knees. Absent proporioception at the toes.     Impression: 68 yo F with significant sensory loss in her LE coupleed with essential tremor. I suspect that this combination of problems is enough to explain her falls, but MRI is pendigns.   Recommendations: 1)Could consider beta blocker for ET. She currently is needing narcotics and other BP lowering medications, and I would favor avoiding this in the current setting. But as an outpatient this could be considered.  2) B12 low normal, will send MMA 3) a1c 4) MRI pending.    Roland Rack, MD [facility] [phone]  If 7pm- 7am, please page neurology on call as listed in [place].

## 2014-03-28 NOTE — Progress Notes (Signed)
LATE ENTRY NOTE Date of Service of Visit - 03/27/2014      Subjective: Hospital day - 2 Patient sleeping well at this time. Patient seen in rounds for Dr. Wynelle Link. Patient, as per family,  is having problems with pain in the leg, requiring pain medications Plan is for surgery early next week.  Objective: Vital signs in last 24 hours: Temp:  [98.1 F (36.7 C)-100.7 F (38.2 C)] 98.1 F (36.7 C) (08/09 0501) Pulse Rate:  [96-108] 96 (08/09 0501) Resp:  [16-20] 20 (08/09 0501) BP: (93-116)/(53-54) 102/53 mmHg (08/09 0501) SpO2:  [93 %-95 %] 95 % (08/09 0501)  Intake/Output from previous day:  Intake/Output Summary (Last 24 hours) at 03/28/14 0733 Last data filed at 03/27/14 0856  Gross per 24 hour  Intake    120 ml  Output      0 ml  Net    120 ml     Labs:  Recent Labs  03/26/14 1211 03/28/14 0509  HGB 13.4 11.6*    Recent Labs  03/26/14 1211 03/28/14 0509  WBC 8.1 8.4  RBC 4.13 3.60*  HCT 40.9 36.0  PLT 185 164    Recent Labs  03/26/14 1211 03/28/14 0509  NA 144 140  K 4.4 4.4  CL 106 100  CO2 26 29  BUN 11 11  CREATININE 0.88 0.88  GLUCOSE 110* 102*  CALCIUM 9.0 8.7   No results found for this basename: LABPT, INR,  in the last 72 hours  EXAM General - Patient is resting comfortably at the time of rounds Extremity - splint intact and elevated onto pillows Dressing - splint intact Good cap refill to toes  Past Medical History  Diagnosis Date  . Hyperlipidemia   . Anxiety   . Depression   . Dyspepsia   . Hypertension   . Anginal pain   . History of bronchitis   . History of blood transfusion 1963    w/childbirth  . Shortness of breath 02/12/12    "all the time"  . H/O hiatal hernia   . Chronic lower back pain   . GERD (gastroesophageal reflux disease)   . COPD (chronic obstructive pulmonary disease)   . Headache(784.0)   . Arthritis     Assessment/Plan: Hospital day - 2 Active Problems:   Fractured tibia and fibula   Right  tibial fracture   Closed right tibial fracture   Unstable gait  Estimated body mass index is 23.82 kg/(m^2) as calculated from the following:   Height as of this encounter: 5\' 10"  (1.778 m).   Weight as of this encounter: 75.297 kg (166 lb). Continue with ice and elevation for swelling and pain. Dr. Doran Durand to operate on fracture early next week.  Arlee Muslim, PA-C Orthopaedic Surgery 03/28/2014, 7:33 AM

## 2014-03-28 NOTE — Progress Notes (Addendum)
[facility] PROGRESS NOTE  Traci [name] ZCH:[id number] DOB: 01-Apr-1946 DOA: 03/26/2014 PCP: Bonnita Nasuti, MD  Assessment/Plan: Active Problems:   Fractured tibia and fibula   Right tibial fracture   Closed right tibial fracture   Unstable gait    Recurrent falls/unstable gait  No signs of parkinsonism or peripheral neuropathy  Neurology consulted  They recommend MRI of the brain  Vitamin B12 levels low, therefore we'll start vitamin B12 supplementation with intramuscular vitamin B12 weekly Folate levels pending PT eval  Minimize sedating medications  Consider ABG if the patient appears to be somnolent Discussed with RN to minimize sedating medications MRi of brain pending per neuro recs  Polypharmacy Have discontinued Robaxin Decrease the dose and frequency of Xanax, oxycodone, morphine  comminuted right distal tib/fib fractures  Plan is for surgery early next week per Dr. Doran Durand DVT prophylaxis per orthopedics  Tobacco abuse: Cessation counseled. Patient declines nicotine patch.  COPD: Stable. Tobacco cessation counseled. Continue when necessary bronchodilators.  History of hyperlipidemia: Continue statins.  History of anxiety & depression: Continue home medications (alprazolam Wellbutrin and BuSpar) but will need psychiatry followup as outpatient to manage these appropriately. Currently does not see a psychiatrist.   History of hypothyroidism: Continue Synthroid., Check TSH  History of CAD: Nonobstructive disease by left heart cath in 2013.    Code Status: full  Family Communication: family updated about patient's clinical progress  Disposition Plan: As above   Brief narrative:  Traci [name] is an 68 y.o. female history hyperlipidemia, depression and anxiety, hypertension and COPD who was admitted today for acute fracture of right ankle associated with a recurrent fall. She's fallen 3 times within the last month. She has sustained injuries to her left hand  as well as right shoulder with previous falls. She is on meclizine for symptoms of vertigo but has not been experiencing vertigo with recent falls. She has a walker as well as a cane but typically does not use any type of assistive device when ambulating.  Consultants:  Neurology Procedures:  None Antibiotics:  None     HPI/Subjective: Somnolent but arousable, poor RN she has received some oxycodone  Objective: Filed Vitals:   03/27/14 0503 03/27/14 1827 03/27/14 2000 03/28/14 0501  BP: 96/51 116/54 93/53 102/53  Pulse: 102 108 102 96  Temp: 98.3 F (36.8 C) 100.7 F (38.2 C) 98.8 F (37.1 C) 98.1 F (36.7 C)  TempSrc: Oral Oral Oral Axillary  Resp: 18 16 20 20   Height:      Weight:      SpO2: 95% 93% 95% 95%   No intake or output data in the 24 hours ending 03/28/14 1011  Exam:  General: alert & oriented x 3 In NAD  Cardiovascular: RRR, nl S1 s2  Respiratory: Decreased breath sounds at the bases, scattered rhonchi, no crackles  Abdomen: soft +BS NT/ND, no masses palpable  Extremities: No cyanosis and no edema      Data Reviewed: Basic Metabolic Panel:  Recent Labs Lab 03/26/14 1211 03/28/14 0509  NA 144 140  K 4.4 4.4  CL 106 100  CO2 26 29  GLUCOSE 110* 102*  BUN 11 11  CREATININE 0.88 0.88  CALCIUM 9.0 8.7    Liver Function Tests:  Recent Labs Lab 03/28/14 0509  AST 14  ALT 10  ALKPHOS 50  BILITOT 0.7  PROT 6.3  ALBUMIN 3.1*   No results found for this basename: LIPASE, AMYLASE,  in the last 168 hours No  results found for this basename: AMMONIA,  in the last 168 hours  CBC:  Recent Labs Lab 03/26/14 1211 03/28/14 0509  WBC 8.1 8.4  NEUTROABS 5.8  --   HGB 13.4 11.6*  HCT 40.9 36.0  MCV 99.0 100.0  PLT 185 164    Cardiac Enzymes: No results found for this basename: CKTOTAL, CKMB, CKMBINDEX, TROPONINI,  in the last 168 hours BNP (last 3 results) No results found for this basename: PROBNP,  in the last 8760  hours   CBG: No results found for this basename: GLUCAP,  in the last 168 hours  No results found for this or any previous visit (from the past 240 hour(s)).   Studies: Dg Ankle Complete Right  03/26/2014   CLINICAL DATA:  Ankle pain secondary to a fall today.  EXAM: RIGHT ANKLE - COMPLETE 3+ VIEW  COMPARISON:  None.  FINDINGS: There are severely comminuted fractures of the distal shafts of tibia and fibula with some angulation and slight displacement. The fractures do not extend to the ankle joint. Diffuse osteopenia.  IMPRESSION: Comminuted fractures of the distal shafts of the tibia and fibula.   Electronically Signed   By: Rozetta Nunnery M.D.   On: 03/26/2014 11:34    Scheduled Meds: . buPROPion  150 mg Oral Daily  . busPIRone  10 mg Oral q morning - 10a  . cholecalciferol  2,000 Units Oral Daily  . cyanocobalamin  1,000 mcg Intramuscular Once  . docusate sodium  100 mg Oral BID  . DULoxetine  60 mg Oral Daily  . enoxaparin (LOVENOX) injection  40 mg Subcutaneous Q24H  . levothyroxine  25 mcg Oral QAC breakfast  . pantoprazole  40 mg Oral Daily  . simvastatin  10 mg Oral q1800  . traZODone  300 mg Oral QHS   Continuous Infusions:   Active Problems:   Fractured tibia and fibula   Right tibial fracture   Closed right tibial fracture   Unstable gait    Time spent: 40 minutes   [facility] Pager [phone]. If 8PM-8AM, please contact night-coverage at [email or web address], password [facility] 03/28/2014, 10:11 AM  LOS: 2 days

## 2014-03-28 NOTE — Progress Notes (Signed)
   Subjective: Hospital day - 3 Patient reports pain as mild and moderate.   Patient seen in rounds with Dr. Wynelle Link. Patient is well, but has had some minor complaints of pain in the leg, requiring pain medications Plan is for surgery early next week per Dr. Doran Durand.  Objective: Vital signs in last 24 hours: Temp:  [98.1 F (36.7 C)-100.7 F (38.2 C)] 98.1 F (36.7 C) (08/09 0501) Pulse Rate:  [96-108] 96 (08/09 0501) Resp:  [16-20] 20 (08/09 0501) BP: (93-116)/(53-54) 102/53 mmHg (08/09 0501) SpO2:  [93 %-95 %] 95 % (08/09 0501)  Intake/Output from previous day:  Intake/Output Summary (Last 24 hours) at 03/28/14 0853 Last data filed at 03/27/14 0856  Gross per 24 hour  Intake    120 ml  Output      0 ml  Net    120 ml   Labs:  Recent Labs  03/26/14 1211 03/28/14 0509  HGB 13.4 11.6*    Recent Labs  03/26/14 1211 03/28/14 0509  WBC 8.1 8.4  RBC 4.13 3.60*  HCT 40.9 36.0  PLT 185 164    Recent Labs  03/26/14 1211 03/28/14 0509  NA 144 140  K 4.4 4.4  CL 106 100  CO2 26 29  BUN 11 11  CREATININE 0.88 0.88  GLUCOSE 110* 102*  CALCIUM 9.0 8.7   No results found for this basename: LABPT, INR,  in the last 72 hours  EXAM General - Patient is Alert and Appropriate Extremity - Neurovascular intact Sensation intact distally Moving toes  Dressing - splint in good condition, arranged back onto pillows Motor Function - intact, moving foot and toes well on exam.   Past Medical History  Diagnosis Date  . Hyperlipidemia   . Anxiety   . Depression   . Dyspepsia   . Hypertension   . Anginal pain   . History of bronchitis   . History of blood transfusion 1963    w/childbirth  . Shortness of breath 02/12/12    "all the time"  . H/O hiatal hernia   . Chronic lower back pain   . GERD (gastroesophageal reflux disease)   . COPD (chronic obstructive pulmonary disease)   . Headache(784.0)   . Arthritis     Assessment/Plan: Hospital day - 3 Active  Problems:   Fractured tibia and fibula   Right tibial fracture   Closed right tibial fracture   Unstable gait  Estimated body mass index is 23.82 kg/(m^2) as calculated from the following:   Height as of this encounter: 5\' 10"  (1.778 m).   Weight as of this encounter: 75.297 kg (166 lb).  Continue with ice and elevation for swelling and pain.  Dr. Doran Durand to operate on fracture early next week.  Arlee Muslim, PA-C Orthopaedic Surgery 03/28/2014, 8:53 AM

## 2014-03-29 ENCOUNTER — Inpatient Hospital Stay (HOSPITAL_COMMUNITY): Payer: Medicare Other

## 2014-03-29 LAB — FOLATE RBC: RBC Folate: 404 ng/mL (ref >280)

## 2014-03-29 MED ORDER — GADOBENATE DIMEGLUMINE 529 MG/ML IV SOLN
15.0000 mL | Freq: Once | INTRAVENOUS | Status: AC | PRN
  Administered 2014-03-29: 15 mL via INTRAVENOUS

## 2014-03-29 MED ORDER — CHLORHEXIDINE GLUCONATE 4 % EX LIQD
60.0000 mL | Freq: Once | CUTANEOUS | Status: AC
  Administered 2014-03-30: 4 via TOPICAL
  Filled 2014-03-29: qty 60

## 2014-03-29 MED ORDER — SENNA 8.6 MG PO TABS
2.0000 | ORAL_TABLET | Freq: Two times a day (BID) | ORAL | Status: DC
  Administered 2014-03-29: 17.2 mg via ORAL
  Filled 2014-03-29 (×4): qty 2

## 2014-03-29 MED ORDER — CLINDAMYCIN PHOSPHATE 900 MG/50ML IV SOLN
900.0000 mg | INTRAVENOUS | Status: AC
  Administered 2014-03-30: 900 mg via INTRAVENOUS
  Filled 2014-03-29: qty 50

## 2014-03-29 MED ORDER — CYANOCOBALAMIN 1000 MCG/ML IJ SOLN
1000.0000 ug | Freq: Every day | INTRAMUSCULAR | Status: DC
  Administered 2014-03-29 – 2014-04-02 (×4): 1000 ug via INTRAMUSCULAR
  Filled 2014-03-29 (×6): qty 1

## 2014-03-29 MED ORDER — ACETAMINOPHEN 500 MG PO TABS
1000.0000 mg | ORAL_TABLET | Freq: Once | ORAL | Status: DC
  Filled 2014-03-29: qty 2

## 2014-03-29 MED ORDER — SODIUM CHLORIDE 0.9 % IV SOLN
INTRAVENOUS | Status: DC
  Administered 2014-03-30: 01:00:00 via INTRAVENOUS

## 2014-03-29 MED ORDER — VITAMIN B-12 1000 MCG PO TABS: 1000.0000 ug | ORAL_TABLET | Freq: Every day | ORAL | Status: DC

## 2014-03-29 NOTE — Progress Notes (Addendum)
[facility] PROGRESS NOTE  Traci [name] OXB:[id number] DOB: Aug 03, 1946 DOA: 03/26/2014 PCP: Bonnita Nasuti, MD  Assessment/Plan: Active Problems:   Fractured tibia and fibula   Right tibial fracture   Closed right tibial fracture   Unstable gait    Recurrent falls/unstable gait  No signs of parkinsonism or peripheral neuropathy  Neurology consulted for ataxia MRI of the brain pending Vitamin B12 levels low, therefore we'll start vitamin B12 supplementation with intramuscular vitamin B12 weekly MMA levels pending Folate levels pending PT eval  Minimize sedating medications  Consider ABG if the patient appears to be somnolent  Discussed with RN to minimize sedating medications  MRi of brain pending per neuro recs    Polypharmacy  Have discontinued Robaxin  Decrease the dose and frequency of Xanax, oxycodone, morphine   comminuted right distal tib/fib fractures  Right tibia and fibula fractures - to OR tomorrow for IM nail  per Dr. Doran Durand  DVT prophylaxis per orthopedics  Tobacco abuse: Cessation counseled. Patient declines nicotine patch.  COPD: Stable. Tobacco cessation counseled. Continue when necessary bronchodilators.  History of hyperlipidemia: Continue statins.  History of anxiety & depression: Continue home medications (alprazolam Wellbutrin and BuSpar) but will need psychiatry followup as outpatient to manage these appropriately. Currently does not see a psychiatrist.  History of hypothyroidism: Continue Synthroid., Check TSH  History of CAD: Nonobstructive disease by left heart cath in 2013.    Code Status: full  Family Communication: family updated about patient's clinical progress  Disposition Plan: As above   Brief narrative:  Traci [name] is an 68 y.o. female history hyperlipidemia, depression and anxiety, hypertension and COPD who was admitted today for acute fracture of right ankle associated with a recurrent fall. She's fallen 3 times within  the last month. She has sustained injuries to her left hand as well as right shoulder with previous falls. She is on meclizine for symptoms of vertigo but has not been experiencing vertigo with recent falls. She has a walker as well as a cane but typically does not use any type of assistive device when ambulating.  Consultants:  Neurology Procedures:  None Antibiotics:  None   HPI/Subjective: Arousable, going down for the MRI  Objective: Filed Vitals:   03/28/14 0501 03/28/14 1322 03/28/14 2051 03/29/14 0548  BP: 102/53 97/40 105/44 137/107  Pulse: 96  106 103  Temp: 98.1 F (36.7 C) 98.2 F (36.8 C) 98.7 F (37.1 C) 97.8 F (36.6 C)  TempSrc: Axillary Oral Oral Oral  Resp: 20 18 20 20   Height:      Weight:      SpO2: 95% 95% 96% 96%    Intake/Output Summary (Last 24 hours) at 03/29/14 1030 Last data filed at 03/28/14 1700  Gross per 24 hour  Intake    360 ml  Output    300 ml  Net     60 ml    Exam:  General: alert & oriented x 3 In NAD  Cardiovascular: RRR, nl S1 s2  Respiratory: Decreased breath sounds at the bases, scattered rhonchi, no crackles  Abdomen: soft +BS NT/ND, no masses palpable  Extremities: No cyanosis and no edema      Data Reviewed: Basic Metabolic Panel:  Recent Labs Lab 03/26/14 1211 03/28/14 0509  NA 144 140  K 4.4 4.4  CL 106 100  CO2 26 29  GLUCOSE 110* 102*  BUN 11 11  CREATININE 0.88 0.88  CALCIUM 9.0 8.7    Liver Function Tests:  Recent Labs Lab 03/28/14 0509  AST 14  ALT 10  ALKPHOS 50  BILITOT 0.7  PROT 6.3  ALBUMIN 3.1*   No results found for this basename: LIPASE, AMYLASE,  in the last 168 hours No results found for this basename: AMMONIA,  in the last 168 hours  CBC:  Recent Labs Lab 03/26/14 1211 03/28/14 0509  WBC 8.1 8.4  NEUTROABS 5.8  --   HGB 13.4 11.6*  HCT 40.9 36.0  MCV 99.0 100.0  PLT 185 164    Cardiac Enzymes: No results found for this basename: CKTOTAL, CKMB, CKMBINDEX,  TROPONINI,  in the last 168 hours BNP (last 3 results) No results found for this basename: PROBNP,  in the last 8760 hours   CBG: No results found for this basename: GLUCAP,  in the last 168 hours  No results found for this or any previous visit (from the past 240 hour(s)).   Studies: Dg Ankle Complete Right  03/26/2014   CLINICAL DATA:  Ankle pain secondary to a fall today.  EXAM: RIGHT ANKLE - COMPLETE 3+ VIEW  COMPARISON:  None.  FINDINGS: There are severely comminuted fractures of the distal shafts of tibia and fibula with some angulation and slight displacement. The fractures do not extend to the ankle joint. Diffuse osteopenia.  IMPRESSION: Comminuted fractures of the distal shafts of the tibia and fibula.   Electronically Signed   By: Rozetta Nunnery M.D.   On: 03/26/2014 11:34    Scheduled Meds: . [START ON 03/30/2014] acetaminophen  1,000 mg Oral Once  . buPROPion  150 mg Oral Daily  . busPIRone  10 mg Oral q morning - 10a  . [START ON 03/30/2014] chlorhexidine  60 mL Topical Once  . cholecalciferol  2,000 Units Oral Daily  . [START ON 03/30/2014] clindamycin (CLEOCIN) IV  900 mg Intravenous On Call to OR  . docusate sodium  100 mg Oral BID  . DULoxetine  60 mg Oral Daily  . levothyroxine  25 mcg Oral QAC breakfast  . pantoprazole  40 mg Oral Daily  . senna  2 tablet Oral BID  . simvastatin  10 mg Oral q1800  . traZODone  300 mg Oral QHS   Continuous Infusions: . [START ON 03/30/2014] sodium chloride      Active Problems:   Fractured tibia and fibula   Right tibial fracture   Closed right tibial fracture   Unstable gait    Time spent: 40 minutes   [facility] Pager [phone]. If 8PM-8AM, please contact night-coverage at [email or web address], password [facility] 03/29/2014, 10:30 AM  LOS: 3 days        \

## 2014-03-29 NOTE — Progress Notes (Addendum)
Subjective: No changes, endorses worse walking in the dark.   Exam: Filed Vitals:   03/29/14 0548  BP: 137/107  Pulse: 103  Temp: 97.8 F (36.6 C)  Resp: 20   Gen: In bed, NAD MS: Awake, alert YT:[id number], EOMI Motor: 5/5, but significant intentional tremor bilaterally.  Sensory:decreased to vibration to the knees. Absent proporioception at the toes.   MRI showed only non-specific [place] matter change.   Impression: 68 yo F with significant sensory loss in her LE coupled with essential tremor. I suspect that this combination of problems is enough to explain her falls. I suspect that her neuropathy is secondary to B12 deficiency given vibration and proprioception > small fiber loss.    Recommendations: 1)Could consider beta blocker for ET. She currently is needing narcotics and other BP lowering medications, and I would favor avoiding this in the current setting. But as an outpatient this could be considered.  2) will start B12 replacement.  3) Next step in evaluation for neuropathy would be an EMG, but this cannot be done as an inpatient. She should follow up as an outpatient with either [facility] [address] [place], [place] [address]--[phone] or [facility] [address] [place], [place] [address] [phone] 4) Please call with any further questions or concerns. Neurology will sign off.     Roland Rack, MD [facility] [phone]  If 7pm- 7am, please page neurology on call as listed in [place].

## 2014-03-29 NOTE — Progress Notes (Signed)
Subjective: 68 y/o woman with fall Friday.  She was admitted with right tibia and fibula fractures.  She c/o aching pain in the right leg that is worse with motion and better with rest.  No n/v.  MRi of brain pending per neuro recs.   Objective: Vital signs in last 24 hours: Temp:  [97.8 F (36.6 C)-98.7 F (37.1 C)] 97.8 F (36.6 C) 04-28-2023 0548) Pulse Rate:  [103-106] 103 04-28-2023 0548) Resp:  [18-20] 20 04/28/23 0548) BP: (97-137)/(40-107) 137/107 mmHg 04-28-2023 0548) SpO2:  [95 %-96 %] 96 % 2023-04-28 0548)  Intake/Output from previous day: 08/09 0701 - 04-28-2023 0700 In: 480 [P.O.:480] Out: 300 [Urine:300] Intake/Output this shift:     Recent Labs  03/26/14 1211 03/28/14 0509  HGB 13.4 11.6*    Recent Labs  03/26/14 1211 03/28/14 0509  WBC 8.1 8.4  RBC 4.13 3.60*  HCT 40.9 36.0  PLT 185 164    Recent Labs  03/26/14 1211 03/28/14 0509  NA 144 140  K 4.4 4.4  CL 106 100  CO2 26 29  BUN 11 11  CREATININE 0.88 0.88  GLUCOSE 110* 102*  CALCIUM 9.0 8.7   No results found for this basename: LABPT, INR,  in the last 72 hours  PE:  elderly woman in nad.  a and O x 4.  Mood and affect normal.  EOMi.  resp unlabored.  R leg splinted.  Toes with brisk cap refill.  5/5 strength in pf and df of the toes.  sens to LT intact.  no lymphadenopathy.  Assessment/Plan: Right tibia and fibula fractures - to oR tomorrow for IM nail.  The risks and benefits of the alternative treatment options have been discussed in detail.  The patient wishes to proceed with surgery and specifically understands risks of bleeding, infection, nerve damage, blood clots, need for additional surgery, amputation and death.    Wylene Simmer 04/27/14, 7:33 AM

## 2014-03-30 ENCOUNTER — Inpatient Hospital Stay (HOSPITAL_COMMUNITY): Payer: Medicare Other

## 2014-03-30 ENCOUNTER — Encounter (HOSPITAL_COMMUNITY): Payer: Medicare Other | Admitting: Anesthesiology

## 2014-03-30 ENCOUNTER — Encounter (HOSPITAL_COMMUNITY)
Admission: EM | Disposition: A | Discharge status: Home Health | Payer: Self-pay | Source: Home / Self Care | Attending: Orthopedic Surgery

## 2014-03-30 ENCOUNTER — Inpatient Hospital Stay (HOSPITAL_COMMUNITY): Payer: Medicare Other | Admitting: Anesthesiology

## 2014-03-30 HISTORY — PX: TIBIA IM NAIL INSERTION: SHX2516

## 2014-03-30 LAB — TROPONIN I

## 2014-03-30 LAB — TYPE AND SCREEN
ABO/RH(D): O POS
Antibody Screen: NEGATIVE

## 2014-03-30 LAB — CBC
HEMATOCRIT: 33 % — AB (ref 36.0–46.0)
HEMOGLOBIN: 10.8 g/dL — AB (ref 12.0–15.0)
MCH: 32.2 pg (ref 26.0–34.0)
MCHC: 32.7 g/dL (ref 30.0–36.0)
MCV: 98.5 fL (ref 78.0–100.0)
Platelets: 193 10*3/uL (ref 150–400)
RBC: 3.35 MIL/uL — AB (ref 3.87–5.11)
RDW: 13.1 % (ref 11.5–15.5)
WBC: 6.5 10*3/uL (ref 4.0–10.5)

## 2014-03-30 LAB — SURGICAL PCR SCREEN
MRSA, PCR: NEGATIVE
Staphylococcus aureus: NEGATIVE

## 2014-03-30 SURGERY — INSERTION, INTRAMEDULLARY ROD, TIBIA
Anesthesia: General | Laterality: Right

## 2014-03-30 MED ORDER — PROMETHAZINE HCL 25 MG/ML IJ SOLN: 6.2500 mg | INTRAMUSCULAR | Status: DC | PRN

## 2014-03-30 MED ORDER — ONDANSETRON HCL 4 MG PO TABS: 4.0000 mg | ORAL_TABLET | Freq: Four times a day (QID) | ORAL | Status: DC | PRN

## 2014-03-30 MED ORDER — ENOXAPARIN SODIUM 40 MG/0.4ML ~~LOC~~ SOLN
40.0000 mg | SUBCUTANEOUS | Status: DC
Start: 2014-03-30 — End: 2014-03-30
  Filled 2014-03-30: qty 0.4

## 2014-03-30 MED ORDER — ONDANSETRON HCL 4 MG/2ML IJ SOLN
INTRAMUSCULAR | Status: DC | PRN
  Administered 2014-03-30: 4 mg via INTRAVENOUS

## 2014-03-30 MED ORDER — FENTANYL CITRATE 0.05 MG/ML IJ SOLN
INTRAMUSCULAR | Status: DC | PRN
  Administered 2014-03-30 (×6): 50 ug via INTRAVENOUS
  Administered 2014-03-30: 100 ug via INTRAVENOUS
  Administered 2014-03-30 (×2): 50 ug via INTRAVENOUS

## 2014-03-30 MED ORDER — OXYMETAZOLINE HCL 0.05 % NA SOLN
2.0000 | Freq: Once | NASAL | Status: DC
  Filled 2014-03-30: qty 15

## 2014-03-30 MED ORDER — PROPOFOL 10 MG/ML IV BOLUS
INTRAVENOUS | Status: AC
  Filled 2014-03-30: qty 20

## 2014-03-30 MED ORDER — CLINDAMYCIN PHOSPHATE 900 MG/50ML IV SOLN
900.0000 mg | Freq: Three times a day (TID) | INTRAVENOUS | Status: AC
  Administered 2014-03-30 – 2014-03-31 (×2): 900 mg via INTRAVENOUS
  Filled 2014-03-30 (×2): qty 50

## 2014-03-30 MED ORDER — OXYCODONE HCL 5 MG/5ML PO SOLN: 5.0000 mg | Freq: Once | ORAL | Status: AC | PRN

## 2014-03-30 MED ORDER — MIDAZOLAM HCL 5 MG/5ML IJ SOLN
INTRAMUSCULAR | Status: DC | PRN
  Administered 2014-03-30 (×2): 1 mg via INTRAVENOUS

## 2014-03-30 MED ORDER — SODIUM CHLORIDE 0.9 % IV SOLN: INTRAVENOUS | Status: DC

## 2014-03-30 MED ORDER — ENOXAPARIN SODIUM 40 MG/0.4ML ~~LOC~~ SOLN
40.0000 mg | SUBCUTANEOUS | Status: DC
  Administered 2014-03-31 – 2014-04-02 (×3): 40 mg via SUBCUTANEOUS
  Filled 2014-03-30 (×4): qty 0.4

## 2014-03-30 MED ORDER — ONDANSETRON HCL 4 MG/2ML IJ SOLN: 4.0000 mg | Freq: Four times a day (QID) | INTRAMUSCULAR | Status: DC | PRN

## 2014-03-30 MED ORDER — FENTANYL CITRATE 0.05 MG/ML IJ SOLN
INTRAMUSCULAR | Status: AC
  Filled 2014-03-30: qty 5

## 2014-03-30 MED ORDER — OXYCODONE HCL 5 MG PO TABS
ORAL_TABLET | ORAL | Status: AC
  Filled 2014-03-30: qty 1

## 2014-03-30 MED ORDER — ROCURONIUM BROMIDE 50 MG/5ML IV SOLN
INTRAVENOUS | Status: AC
  Filled 2014-03-30: qty 1

## 2014-03-30 MED ORDER — OXYCODONE HCL 5 MG PO TABS
5.0000 mg | ORAL_TABLET | Freq: Once | ORAL | Status: AC | PRN
  Administered 2014-03-30: 5 mg via ORAL

## 2014-03-30 MED ORDER — GLYCOPYRROLATE 0.2 MG/ML IJ SOLN
INTRAMUSCULAR | Status: AC
  Filled 2014-03-30: qty 2

## 2014-03-30 MED ORDER — HYDROMORPHONE HCL PF 1 MG/ML IJ SOLN
0.5000 mg | INTRAMUSCULAR | Status: DC | PRN
  Administered 2014-03-30 – 2014-03-31 (×3): 0.5 mg via INTRAVENOUS
  Filled 2014-03-30 (×3): qty 1

## 2014-03-30 MED ORDER — LIDOCAINE-PRILOCAINE 2.5-2.5 % EX CREA
1.0000 "application " | TOPICAL_CREAM | Freq: Once | CUTANEOUS | Status: DC
  Filled 2014-03-30: qty 5

## 2014-03-30 MED ORDER — SENNA 8.6 MG PO TABS
1.0000 | ORAL_TABLET | Freq: Two times a day (BID) | ORAL | Status: DC
  Administered 2014-03-30 – 2014-04-02 (×6): 8.6 mg via ORAL
  Filled 2014-03-30 (×7): qty 1

## 2014-03-30 MED ORDER — ALBUMIN HUMAN 5 % IV SOLN
INTRAVENOUS | Status: DC | PRN
  Administered 2014-03-30: 15:00:00 via INTRAVENOUS

## 2014-03-30 MED ORDER — SODIUM CHLORIDE 0.9 % IV SOLN
INTRAVENOUS | Status: DC
  Administered 2014-03-30: 22:00:00 via INTRAVENOUS

## 2014-03-30 MED ORDER — LACTATED RINGERS IV SOLN
INTRAVENOUS | Status: DC | PRN
  Administered 2014-03-30 (×2): via INTRAVENOUS

## 2014-03-30 MED ORDER — DEXAMETHASONE SODIUM PHOSPHATE 4 MG/ML IJ SOLN
INTRAMUSCULAR | Status: DC | PRN
  Administered 2014-03-30: 8 mg via INTRAVENOUS

## 2014-03-30 MED ORDER — LIDOCAINE HCL (CARDIAC) 20 MG/ML IV SOLN
INTRAVENOUS | Status: DC | PRN
  Administered 2014-03-30: 60 mg via INTRAVENOUS

## 2014-03-30 MED ORDER — BACITRACIN ZINC 500 UNIT/GM EX OINT
TOPICAL_OINTMENT | CUTANEOUS | Status: AC
  Filled 2014-03-30: qty 15

## 2014-03-30 MED ORDER — GLYCOPYRROLATE 0.2 MG/ML IJ SOLN
INTRAMUSCULAR | Status: DC | PRN
  Administered 2014-03-30: 0.3 mg via INTRAVENOUS

## 2014-03-30 MED ORDER — MORPHINE SULFATE 2 MG/ML IJ SOLN
1.0000 mg | INTRAMUSCULAR | Status: DC | PRN
  Administered 2014-03-30 (×2): 2 mg via INTRAVENOUS

## 2014-03-30 MED ORDER — ONDANSETRON HCL 4 MG/2ML IJ SOLN
INTRAMUSCULAR | Status: AC
  Filled 2014-03-30: qty 2

## 2014-03-30 MED ORDER — HYDROCODONE-ACETAMINOPHEN 5-325 MG PO TABS
1.0000 | ORAL_TABLET | ORAL | Status: DC | PRN
  Administered 2014-03-31 – 2014-04-02 (×9): 1 via ORAL
  Filled 2014-03-30: qty 1
  Filled 2014-03-30: qty 2
  Filled 2014-03-30 (×6): qty 1
  Filled 2014-03-30: qty 2

## 2014-03-30 MED ORDER — MORPHINE SULFATE 2 MG/ML IJ SOLN
INTRAMUSCULAR | Status: AC
  Filled 2014-03-30: qty 1

## 2014-03-30 MED ORDER — NEOSTIGMINE METHYLSULFATE 10 MG/10ML IV SOLN
INTRAVENOUS | Status: DC | PRN
  Administered 2014-03-30: 2 mg via INTRAVENOUS

## 2014-03-30 MED ORDER — LIDOCAINE HCL (CARDIAC) 20 MG/ML IV SOLN
INTRAVENOUS | Status: AC
  Filled 2014-03-30: qty 5

## 2014-03-30 MED ORDER — PROPOFOL 10 MG/ML IV BOLUS
INTRAVENOUS | Status: DC | PRN
  Administered 2014-03-30: 120 mg via INTRAVENOUS

## 2014-03-30 MED ORDER — DOCUSATE SODIUM 100 MG PO CAPS
100.0000 mg | ORAL_CAPSULE | Freq: Two times a day (BID) | ORAL | Status: DC
  Administered 2014-03-30 – 2014-04-02 (×6): 100 mg via ORAL
  Filled 2014-03-30 (×6): qty 1

## 2014-03-30 MED ORDER — CLINDAMYCIN PHOSPHATE 900 MG/50ML IV SOLN
INTRAVENOUS | Status: AC
  Filled 2014-03-30: qty 50

## 2014-03-30 MED ORDER — 0.9 % SODIUM CHLORIDE (POUR BTL) OPTIME
TOPICAL | Status: DC | PRN
  Administered 2014-03-30: 1000 mL

## 2014-03-30 MED ORDER — ROCURONIUM BROMIDE 100 MG/10ML IV SOLN
INTRAVENOUS | Status: DC | PRN
  Administered 2014-03-30: 30 mg via INTRAVENOUS

## 2014-03-30 MED ORDER — MIDAZOLAM HCL 2 MG/2ML IJ SOLN
INTRAMUSCULAR | Status: AC
  Filled 2014-03-30: qty 2

## 2014-03-30 MED ORDER — MORPHINE SULFATE 4 MG/ML IJ SOLN
INTRAMUSCULAR | Status: AC
  Administered 2014-03-30: 2 mg
  Filled 2014-03-30: qty 1

## 2014-03-30 MED ORDER — MIDAZOLAM HCL 2 MG/2ML IJ SOLN: 1.0000 mg | INTRAMUSCULAR | Status: DC | PRN

## 2014-03-30 SURGICAL SUPPLY — 58 items
BANDAGE ELASTIC 4 VELCRO ST LF (GAUZE/BANDAGES/DRESSINGS) ×3 IMPLANT
BANDAGE ELASTIC 6 VELCRO ST LF (GAUZE/BANDAGES/DRESSINGS) ×2 IMPLANT
BANDAGE ESMARK 6X9 LF (GAUZE/BANDAGES/DRESSINGS) IMPLANT
BIT DRILL 3.8X6 NS (BIT) ×2 IMPLANT
BIT DRILL 4.4 NS (BIT) ×2 IMPLANT
BIT DRILL WIN 3.0 (BIT) ×1 IMPLANT
BIT DRILL WIN 3.0MM (BIT) ×1
BNDG CMPR 9X6 STRL LF SNTH (GAUZE/BANDAGES/DRESSINGS)
BNDG COHESIVE 4X5 TAN STRL (GAUZE/BANDAGES/DRESSINGS) ×3 IMPLANT
BNDG COHESIVE 6X5 TAN STRL LF (GAUZE/BANDAGES/DRESSINGS) ×3 IMPLANT
BNDG ESMARK 6X9 LF (GAUZE/BANDAGES/DRESSINGS)
COVER SURGICAL LIGHT HANDLE (MISCELLANEOUS) ×6 IMPLANT
CUFF TOURNIQUET SINGLE 34IN LL (TOURNIQUET CUFF) ×3 IMPLANT
CUFF TOURNIQUET SINGLE 44IN (TOURNIQUET CUFF) IMPLANT
DRAPE C-ARM 42X72 X-RAY (DRAPES) ×3 IMPLANT
DRAPE C-ARMOR (DRAPES) ×3 IMPLANT
DRAPE ORTHO SPLIT 77X108 STRL (DRAPES) ×3
DRAPE SURG ORHT 6 SPLT 77X108 (DRAPES) ×1 IMPLANT
DRAPE U-SHAPE 47X51 STRL (DRAPES) ×3 IMPLANT
DRSG ADAPTIC 3X8 NADH LF (GAUZE/BANDAGES/DRESSINGS) ×3 IMPLANT
ELECT REM PT RETURN 9FT ADLT (ELECTROSURGICAL) ×3
ELECTRODE REM PT RTRN 9FT ADLT (ELECTROSURGICAL) ×1 IMPLANT
EVACUATOR 1/8 PVC DRAIN (DRAIN) IMPLANT
GAUZE SPONGE 4X4 12PLY STRL (GAUZE/BANDAGES/DRESSINGS) ×3 IMPLANT
GLOVE BIO SURGEON STRL SZ7 (GLOVE) ×3 IMPLANT
GLOVE BIO SURGEON STRL SZ8 (GLOVE) ×6 IMPLANT
GLOVE BIOGEL PI IND STRL 8 (GLOVE) ×1 IMPLANT
GLOVE BIOGEL PI INDICATOR 8 (GLOVE) ×2
GOWN STRL REUS W/ TWL LRG LVL3 (GOWN DISPOSABLE) ×3 IMPLANT
GOWN STRL REUS W/TWL LRG LVL3 (GOWN DISPOSABLE) ×9
GUIDEWIRE BALL NOSE 80CM (WIRE) ×4 IMPLANT
KIT BASIN OR (CUSTOM PROCEDURE TRAY) ×3 IMPLANT
KIT ROOM TURNOVER OR (KITS) ×3 IMPLANT
NAIL FLEXIBLE WIN 2.5MM (Nail) ×2 IMPLANT
NAIL TIBIAL 10MMX36CM (Nail) ×2 IMPLANT
PACK ORTHO EXTREMITY (CUSTOM PROCEDURE TRAY) ×3 IMPLANT
PAD ABD 8X10 STRL (GAUZE/BANDAGES/DRESSINGS) ×2 IMPLANT
PAD ARMBOARD 7.5X6 YLW CONV (MISCELLANEOUS) ×6 IMPLANT
PAD CAST 4YDX4 CTTN HI CHSV (CAST SUPPLIES) IMPLANT
PADDING CAST COTTON 4X4 STRL (CAST SUPPLIES) ×3
PADDING CAST COTTON 6X4 STRL (CAST SUPPLIES) ×2 IMPLANT
PIN GUIDE 3.2X14 1401214 (MISCELLANEOUS) ×2 IMPLANT
SCREW ACECAP 56MM (Screw) ×2 IMPLANT
SCREW PROXIMAL DEPUY (Screw) ×3 IMPLANT
SCREW PRXML FT 45X5.5XLCK NS (Screw) IMPLANT
SPONGE GAUZE 4X4 12PLY STER LF (GAUZE/BANDAGES/DRESSINGS) ×2 IMPLANT
SPONGE LAP 18X18 X RAY DECT (DISPOSABLE) ×3 IMPLANT
STAPLER VISISTAT 35W (STAPLE) ×3 IMPLANT
SUT VIC AB 0 CT1 27 (SUTURE)
SUT VIC AB 0 CT1 27XBRD ANBCTR (SUTURE) IMPLANT
SUT VIC AB 2-0 CT1 27 (SUTURE)
SUT VIC AB 2-0 CT1 TAPERPNT 27 (SUTURE) IMPLANT
TOWEL OR 17X24 6PK STRL BLUE (TOWEL DISPOSABLE) ×3 IMPLANT
TOWEL OR 17X26 10 PK STRL BLUE (TOWEL DISPOSABLE) ×3 IMPLANT
TUBE CONNECTING 12'X1/4 (SUCTIONS) ×1
TUBE CONNECTING 12X1/4 (SUCTIONS) ×2 IMPLANT
UNDERPAD 30X30 INCONTINENT (UNDERPADS AND DIAPERS) ×3 IMPLANT
YANKAUER SUCT BULB TIP NO VENT (SUCTIONS) ×3 IMPLANT

## 2014-03-30 NOTE — Progress Notes (Signed)
No need for type and screen per Dr. Orene Desanctis.

## 2014-03-30 NOTE — Transfer of Care (Signed)
Immediate Anesthesia Transfer of Care Note  Patient: Traci [name]  Procedure(s) Performed: Procedure(s): INTRAMEDULLARY (IM) NAIL TIBIAL (Right)  Patient Location: PACU  Anesthesia Type:General  Level of Consciousness: lethargic and responds to stimulation  Airway & Oxygen Therapy: Patient Spontanous Breathing and Patient connected to face mask oxygen  Post-op Assessment: Report given to PACU RN  Post vital signs: Reviewed and stable  Complications: No apparent anesthesia complications

## 2014-03-30 NOTE — Anesthesia Postprocedure Evaluation (Signed)
  Anesthesia Post-op Note  Patient: Traci [name]  Procedure(s) Performed: Procedure(s): INTRAMEDULLARY (IM) NAIL TIBIAL (Right)  Patient Location: PACU  Anesthesia Type:General  Level of Consciousness: awake and alert   Airway and Oxygen Therapy: Patient Spontanous Breathing  Post-op Pain: mild  Post-op Assessment: Post-op Vital signs reviewed and Patient's Cardiovascular Status Stable  Post-op Vital Signs: stable  Last Vitals:  Filed Vitals:   03/30/14 1737  BP:   Pulse: 97  Temp:   Resp: 12    Complications: No apparent anesthesia complications

## 2014-03-30 NOTE — Brief Op Note (Signed)
03/26/2014 - 03/30/2014  4:37 PM  PATIENT:  Traci [name]  68 y.o. female  PRE-OPERATIVE DIAGNOSIS:  Right tibial and fibular fractures  POST-OPERATIVE DIAGNOSIS:  same  Procedure(s): 1.  Open treatment of right tibial shaft fracture with intramedullary nailing 2.  Open treatment of right fibular shaft fracture with intramedullary nailing  SURGEON:  Wylene Simmer, MD  ASSISTANT:  Lorin Mercy, PA-C  ANESTHESIA:   General  EBL:  minimal   TOURNIQUET:   Total Tourniquet Time Documented: Thigh (Right) - 70 minutes Total: Thigh (Right) - 70 minutes   COMPLICATIONS:  None apparent  DISPOSITION:  Extubated, awake and stable to recovery.  DICTATION ID:  [id number]

## 2014-03-30 NOTE — Progress Notes (Signed)
[facility] PROGRESS NOTE  BRIAR SWORD QPY:[id number] DOB: 05-03-1946 DOA: 03/26/2014 PCP: Bonnita Nasuti, MD  Assessment/Plan: Active Problems:   Fractured tibia and fibula   Right tibial fracture   Closed right tibial fracture   Unstable gait    Tachycardia, likely sinus She had tachycardia upon admission Will recheck EKG and place the patient on telemetry If EKG looks okay the patient can go ahead with the surgery We'll also check a stat troponin We did adjust her Xanax yesterday because of sedation If not resolved with adequate pain control and hydration, can increase Xanax back up to her home dose  Recurrent falls/unstable gait  Secondary to Essential tremor/vitamin B 12 deficiency Neurology consulted for ataxia  MRI of the brain showed only non-specific [place] matter change consider beta blocker for ET to be initiated outpatient Started vitamin B12 supplementation with intramuscular vitamin B12 weekly  MMA levels pending  Folate levels within normal limits PT eval after surgery Minimize sedating medications  Consider ABG if the patient appears to be somnolent  Discussed with RN to minimize sedating medications     Polypharmacy  Have discontinued Robaxin  Decrease the dose and frequency of Xanax, oxycodone, morphine   comminuted right distal tib/fib fractures  Right tibia and fibula fractures - to OR 8/11 for IM nail  per Dr. Doran Durand  DVT prophylaxis per orthopedics    Tobacco abuse: Cessation counseled. Patient declines nicotine patch.  COPD: Stable. Tobacco cessation counseled. Continue when necessary bronchodilators.  History of hyperlipidemia: Continue statins.  History of anxiety & depression: Continue home medications (alprazolam Wellbutrin and BuSpar) but will need psychiatry followup as outpatient to manage these appropriately. Currently does not see a psychiatrist.  History of hypothyroidism: Continue Synthroid., Check TSH  History of CAD:  Nonobstructive disease by left heart cath in 2013.    Code Status: full  Family Communication: family updated about patient's clinical progress  Disposition Plan: As above  Brief narrative:  Traci [name] is an 68 y.o. female history hyperlipidemia, depression and anxiety, hypertension and COPD who was admitted today for acute fracture of right ankle associated with a recurrent fall. She's fallen 3 times within the last month. She has sustained injuries to her left hand as well as right shoulder with previous falls. She is on meclizine for symptoms of vertigo but has not been experiencing vertigo with recent falls. She has a walker as well as a cane but typically does not use any type of assistive device when ambulating.  Consultants:  Neurology Procedures:  None Antibiotics:  None      HPI/Subjective: Somnolent denies any chest pain any shortness of breath  Objective: Filed Vitals:   03/29/14 1444 03/29/14 2112 03/30/14 0436 03/30/14 0800  BP: 97/54 112/56 108/54   Pulse: 106 106 129   Temp: 98.6 F (37 C) 98.1 F (36.7 C) 98.6 F (37 C)   TempSrc: Oral     Resp: 20 18 18 16   Height:      Weight:      SpO2: 100% 91% 90%    No intake or output data in the 24 hours ending 03/30/14 1108  Exam:  General: alert & oriented x 3 In NAD  Cardiovascular: RRR, nl S1 s2  Respiratory: Decreased breath sounds at the bases, scattered rhonchi, no crackles  Abdomen: soft +BS NT/ND, no masses palpable  Extremities: No cyanosis and no edema      Data Reviewed: Basic Metabolic Panel:  Recent Labs Lab 03/26/14  1211 03/28/14 0509  NA 144 140  K 4.4 4.4  CL 106 100  CO2 26 29  GLUCOSE 110* 102*  BUN 11 11  CREATININE 0.88 0.88  CALCIUM 9.0 8.7    Liver Function Tests:  Recent Labs Lab 03/28/14 0509  AST 14  ALT 10  ALKPHOS 50  BILITOT 0.7  PROT 6.3  ALBUMIN 3.1*   No results found for this basename: LIPASE, AMYLASE,  in the last 168 hours No results found  for this basename: AMMONIA,  in the last 168 hours  CBC:  Recent Labs Lab 03/26/14 1211 03/28/14 0509 03/30/14 0434  WBC 8.1 8.4 6.5  NEUTROABS 5.8  --   --   HGB 13.4 11.6* 10.8*  HCT 40.9 36.0 33.0*  MCV 99.0 100.0 98.5  PLT 185 164 193    Cardiac Enzymes: No results found for this basename: CKTOTAL, CKMB, CKMBINDEX, TROPONINI,  in the last 168 hours BNP (last 3 results) No results found for this basename: PROBNP,  in the last 8760 hours   CBG: No results found for this basename: GLUCAP,  in the last 168 hours  Recent Results (from the past 240 hour(s))  SURGICAL PCR SCREEN     Status: None   Collection Time    03/30/14  5:41 AM      Result Value Ref Range Status   MRSA, PCR NEGATIVE  NEGATIVE Final   Staphylococcus aureus NEGATIVE  NEGATIVE Final   Comment:            The Xpert SA Assay (FDA     approved for NASAL specimens     in patients over 95 years of age),     is one component of     a comprehensive surveillance     program.  Test performance has     been validated by [employer] for patients greater     than or equal to 9 year old.     It is not intended     to diagnose infection nor to     guide or monitor treatment.     Studies: Dg Ankle Complete Right  03/26/2014   CLINICAL DATA:  Ankle pain secondary to a fall today.  EXAM: RIGHT ANKLE - COMPLETE 3+ VIEW  COMPARISON:  None.  FINDINGS: There are severely comminuted fractures of the distal shafts of tibia and fibula with some angulation and slight displacement. The fractures do not extend to the ankle joint. Diffuse osteopenia.  IMPRESSION: Comminuted fractures of the distal shafts of the tibia and fibula.   Electronically Signed   By: Rozetta Nunnery M.D.   On: 03/26/2014 11:34   Mr Jeri Cos VX Contrast  03/29/2014   CLINICAL DATA:  Ataxia.  Recent fall.  EXAM: MRI HEAD WITHOUT AND WITH CONTRAST  TECHNIQUE: Multiplanar, multiecho pulse sequences of the brain and surrounding structures were obtained  without and with intravenous contrast.  CONTRAST:  55mL MULTIHANCE GADOBENATE DIMEGLUMINE 529 MG/ML IV SOLN  COMPARISON:  CT head without contrast 03/30/2008  FINDINGS: The diffusion-weighted images demonstrate no evidence for acute or subacute infarction. No hemorrhage or mass lesion is present. Periventricular and subcortical Teegarden matter changes bilaterally are greater than expected for age. Mild generalized atrophy is present. The ventricles are proportionate to the degree of atrophy. Flow is present in the major intracranial arteries.  No significant extraaxial fluid collection is present. The patient is status post bilateral lens replacements. The paranasal sinuses and  mastoid air cells are clear.  The postcontrast images are moderately degraded by patient motion. No pathologic enhancement is evident.  IMPRESSION: 1. No acute intracranial abnormality. 2. Periventricular and subcortical Demetrius matter changes bilaterally are greater than expected for age. The finding is nonspecific but can be seen in the setting of chronic microvascular ischemia, a demyelinating process such as multiple sclerosis, vasculitis, complicated migraine headaches, or as the sequelae of a prior infectious or inflammatory process. 3. Study is mildly degraded by patient motion. This limits sensitivity for small lesions.   Electronically Signed   By: Lawrence Santiago M.D.   On: 03/29/2014 10:49    Scheduled Meds: . acetaminophen  1,000 mg Oral Once  . buPROPion  150 mg Oral Daily  . busPIRone  10 mg Oral q morning - 10a  . chlorhexidine  60 mL Topical Once  . cholecalciferol  2,000 Units Oral Daily  . clindamycin (CLEOCIN) IV  900 mg Intravenous On Call to OR  . cyanocobalamin  1,000 mcg Intramuscular Daily  . docusate sodium  100 mg Oral BID  . DULoxetine  60 mg Oral Daily  . enoxaparin (LOVENOX) injection  40 mg Subcutaneous Q24H  . levothyroxine  25 mcg Oral QAC breakfast  . pantoprazole  40 mg Oral Daily  . senna  2 tablet  Oral BID  . simvastatin  10 mg Oral q1800  . traZODone  300 mg Oral QHS  . [START ON 04/05/2014] vitamin B-12  1,000 mcg Oral Daily   Continuous Infusions: . sodium chloride      Active Problems:   Fractured tibia and fibula   Right tibial fracture   Closed right tibial fracture   Unstable gait    Time spent: 40 minutes   [facility] Pager [phone]. If 7PM-7AM, please contact night-coverage at [email or web address], password [facility] 03/30/2014, 11:08 AM  LOS: 4 days

## 2014-03-30 NOTE — Anesthesia Preprocedure Evaluation (Signed)
Anesthesia Evaluation  Patient identified by MRN, date of birth, ID band Patient awake    Reviewed: Allergy & Precautions, H&P , NPO status , Patient's Chart, lab work & pertinent test results  History of Anesthesia Complications Negative for: history of anesthetic complications  Airway Mallampati: II  Neck ROM: Full    Dental  (+) Edentulous Upper   Pulmonary shortness of breath, COPDCurrent Smoker, former smoker,  breath sounds clear to auscultation        Cardiovascular hypertension, Rhythm:Regular Rate:Tachycardia     Neuro/Psych  Headaches,    GI/Hepatic Neg liver ROS, hiatal hernia, GERD-  ,  Endo/Other  negative endocrine ROS  Renal/GU negative Renal ROS     Musculoskeletal negative musculoskeletal ROS (+)   Abdominal   Peds  Hematology negative hematology ROS (+)   Anesthesia Other Findings   Reproductive/Obstetrics                           Anesthesia Physical Anesthesia Plan  ASA: III  Anesthesia Plan: General   Post-op Pain Management:    Induction: Intravenous  Airway Management Planned: Oral ETT  Additional Equipment:   Intra-op Plan:   Post-operative Plan: Extubation in OR  Informed Consent: I have reviewed the patients History and Physical, chart, labs and discussed the procedure including the risks, benefits and alternatives for the proposed anesthesia with the patient or authorized representative who has indicated his/her understanding and acceptance.   Dental advisory given  Plan Discussed with: CRNA and Surgeon  Anesthesia Plan Comments:         Anesthesia Quick Evaluation

## 2014-03-30 NOTE — Progress Notes (Signed)
Results of the EKG were text-paged to Dr. Allyson Sabal. No new orders were received.

## 2014-03-31 DIAGNOSIS — R000 Tachycardia, unspecified: Secondary | ICD-10-CM

## 2014-03-31 LAB — ABO/RH: ABO/RH(D): O POS

## 2014-03-31 MED ORDER — SENNA 8.6 MG PO TABS: 2.0000 | ORAL_TABLET | Freq: Two times a day (BID) | ORAL | Status: DC

## 2014-03-31 MED ORDER — OXYCODONE HCL 5 MG PO TABS: 5.0000 mg | ORAL_TABLET | Freq: Four times a day (QID) | ORAL | Status: DC | PRN

## 2014-03-31 MED ORDER — CYANOCOBALAMIN 1000 MCG PO TABS: 1000.0000 ug | ORAL_TABLET | Freq: Every day | ORAL | Status: DC

## 2014-03-31 MED ORDER — MOMETASONE FURO-FORMOTEROL FUM 100-5 MCG/ACT IN AERO
2.0000 | INHALATION_SPRAY | Freq: Two times a day (BID) | RESPIRATORY_TRACT | Status: DC
  Administered 2014-04-01 – 2014-04-02 (×3): 2 via RESPIRATORY_TRACT
  Filled 2014-03-31 (×2): qty 8.8

## 2014-03-31 MED ORDER — DSS 100 MG PO CAPS: 100.0000 mg | ORAL_CAPSULE | Freq: Two times a day (BID) | ORAL | Status: DC

## 2014-03-31 MED ORDER — ENOXAPARIN SODIUM 40 MG/0.4ML ~~LOC~~ SOLN: 40.0000 mg | SUBCUTANEOUS | Status: DC

## 2014-03-31 NOTE — Discharge Instructions (Addendum)
Traci Simmer, MD [place]  Please read the following information regarding your care after surgery.  Medications  You only need a prescription for the narcotic pain medicine (ex. oxycodone, Percocet, Norco).  All of the other medicines listed below are available over the counter. X oxycodone as prescribed for moderate to severe pain   Narcotic pain medicine (ex. oxycodone, Percocet, Vicodin) will cause constipation.  To prevent this problem, take the following medicines while you are taking any pain medicine. X docusate sodium (Colace) 100 mg twice a day X senna (Senokot) 2 tablets twice a day  X To help prevent blood clots, take lovenox once a day for two weeks after surgery.  You should also get up every hour while you are awake to move around.    Weight Bearing X Do not bear any weight on the operated leg or foot.  Cast / Splint / Dressing X Keep your splint or cast clean and dry.  Dont put anything (coat hanger, pencil, etc) down inside of it.  If it gets damp, use a hair dryer on the cool setting to dry it.  If it gets soaked, call the office to schedule an appointment for a cast change.   After your dressing, cast or splint is removed; you may shower, but do not soak or scrub the wound.  Allow the water to run over it, and then gently pat it dry.  Swelling It is normal for you to have swelling where you had surgery.  To reduce swelling and pain, keep your toes above your nose for at least 3 days after surgery.  It may be necessary to keep your foot or leg elevated for several weeks.  If it hurts, it should be elevated.  Follow Up Call my office at [phone] when you are discharged from the hospital or surgery center to schedule an appointment to be seen two weeks after surgery.  Call my office at [phone] if you develop a fever >101.5 F, nausea, vomiting, bleeding from the surgical site or severe pain.     ALSO:  1.  Please weigh yourself daily in the  morning and record the weights in a journal.  If you gain more than 3-lbs in 1 day or 5-lbs in 1 week, call your doctor.  Also call your doctor if you notice increased shortness of breath or swelling of BOTH legs worse than before.    2.  Please talk to your doctor about a neurology referral.  You need an EMG done to evaluate your tremor and balance problems.  You can get this done at either [facility] [address] [place], [place] [address]--[phone] or [facility] [address] [place], [place] [address] [phone].    3.  You may benefit from starting a medication called a beta blocker for your tremor, but sometimes these medications can worsen shortness of breath.  Once you are more recovered from your surgery, please discuss this with your primary care doctor.    4.  For your heart disease, please take a baby aspirin every day.  Talk to your doctor about changing your cholesterol medication to lipitor or crestor per new heart guidelines.    5.  I have given you a prescription for dulera to help your breathing.  This medication helps reduce wheezing and shortness of breath.

## 2014-03-31 NOTE — Op Note (Signed)
[name], [name] NO.:  [id number]  MEDICAL RECORD NO.:  [id number]  LOCATION:  [id number]                        FACILITY:  [facility]  PHYSICIAN:  Wylene Simmer, MD        DATE OF BIRTH:  1945-08-23  DATE OF PROCEDURE:  03/30/2014 DATE OF DISCHARGE:                              OPERATIVE REPORT   PREOPERATIVE DIAGNOSIS:  Right tibial and fibular shaft fractures.  POSTOPERATIVE DIAGNOSIS:  Right tibial and fibular shaft fractures.  PROCEDURE: 1. Open treatment of right tibial shaft fracture with intramedullary     nailing. 2. Open treatment of right fibular shaft fracture with intramedullary     nailing.  SURGEON:  Wylene Simmer, MD  ASSISTANT:  Lorin Mercy, PA-C  ANESTHESIA:  General.  ESTIMATED BLOOD LOSS:  Minimal.  TOURNIQUET TIME:  70 minutes at 300 mmHg.  COMPLICATIONS:  None apparent.  DISPOSITION:  Extubated, awake, and stable to recovery.  INDICATIONS FOR PROCEDURE:  The patient is a 68 year old woman with past medical history significant for smoking who fell injuring her right leg. Radiographs revealed fractures of the tibia and fibula.  She presents now for operative treatment of this unstable injury.  She understands the risks and benefits, the alternative treatment options, and elects surgical treatment.  She specifically understands risks of bleeding, infection, nerve damage, blood clots, need for additional surgery, continued pain, nonunion, amputation, and death.  PROCEDURE IN DETAIL:  After preoperative consent was obtained, and the correct operative site was identified, the patient was brought to the operating room and placed supine on the operating table.  General anesthesia was induced.  Preoperative antibiotics were administered. Surgical time-out was taken.  The right lower extremity was prepped and draped in standard sterile fashion with tourniquet around the thigh. The extremity was exsanguinated.  The tourniquet was inflated to  250 mmHg.  A lateral parapatellar incision was made at the knee.  Sharp dissection was carried down through the skin and subcutaneous tissue. The lateral patellar retinaculum was incised.  The interval between the patellar tendon and the fat pad was developed.  A guide pin was inserted in line with the medullary canal.  AP and lateral radiographs confirmed appropriate position of the guide pin.  This was advanced into the medullary canal, and again, radiographs confirmed appropriate positioning.  The cannulated awl was then inserted over the guide pin and used to open the medullary canal.  The guide pin was removed and a ball-tipped guidewire was inserted.  The fracture site was reduced and the guidewire was advanced across the fracture site to the center of the distal tibia in both the AP and lateral planes.  The guide pin was then sequentially reamed to a diameter of 11.5 mm.  The guide pin was measured and a 36 mm x 10 mm Biomet tibial nail from the VersaNail set was inserted.  The guide pin was removed after AP and lateral radiographs confirmed appropriate positioning of the nail distally and proximally.  The targeting jig was then used to insert an interlocking screw proximally at the nail.  The perfect circle technique was then used to insert 2 interlocking screws distally.  Both of these  were inserted in a percutaneous fashion.  AP and lateral radiographs confirmed appropriate positioning of all hardware and appropriate reduction of the fracture site.  A stab incision was then made at the tip of the fibula.  A drill was used to open the medullary canal at the tip distally.  A 2.5 mm Biomet WIN nail was inserted.  The fracture site was reduced and the guide pin was advanced across the fracture site into the medullary canal proximally.  AP and lateral radiographs confirmed appropriate position of the guide pin.  The pin was then cut and tamped into position and flushed with the tip  of the fibula distally.  AP and lateral radiographs at the proximal end of the nail and the distal end of the nail showed appropriate reduction of the fracture and appropriate position and length of all hardware.  All wounds were then irrigated copiously.  The retinaculum was closed with figure-of-eight sutures of 0 Vicryl. Subcutaneous tissues were approximated with inverted simple sutures of 3- 0 Monocryl.  Running and horizontal mattress sutures of 3-0 nylon were used to close all of the incisions.  Sterile dressings were applied followed by well-padded short-leg splint.  Tourniquet was released at 70 minutes.  The patient was awakened from anesthesia and transported to the recovery room in stable condition.  FOLLOWUP PLAN:  The patient will be nonweightbearing on the right lower extremity.  She will have physical therapy, occupational therapy, and case management consults, and we will plan discharge for tomorrow or the next day.  She will start Lovenox tomorrow for DVT prophylaxis.  Lorin Mercy, PA-C was present, scrubbed for the duration of the case. Her assistance was essentially gaining and maintaining exposure, performing the reduction, inserting the hardware, and closing and dressing all wounds, as well as applying a splint.     Wylene Simmer, MD     JH/MEDQ  D:  03/30/2014  T:  03/31/2014  Job:  [id number]

## 2014-03-31 NOTE — Care Management Note (Signed)
CARE MANAGEMENT NOTE 03/31/2014  Patient:  Traci [name], Traci [name]   Account Number:  [id number]  Date Initiated:  03/27/2014  Documentation initiated by:  Ricki Miller  Subjective/Objective Assessment:   68 yr old female admitted s/p fall with right tib/fib fracture.  S/p Right tibia IM Nailing, Right fibula IM Nailing.     Action/Plan:   Case Manager spoke with patient and granddaughter concerning home health and DME needs. Choice offered. Referral called to Pear[facility] [place], [facility] liaison. Patient has rolling walker and 3in1.   Anticipated DC Date:  04/02/2014   Anticipated DC Plan:  [place]  CM consult      PAC Choice  [place]   Choice offered to / List presented to:  C-1 Patient   DME arranged  [place]      DME agency  [place] arranged  [place].   Status of service:  In process, will continue to follow Medicare Important Message given?  YES (If response is "NO", the following Medicare IM given date fields will be blank) Date Medicare IM given:  03/31/2014 Medicare IM given by:  Ricki Miller Date Additional Medicare IM given:   Additional Medicare IM given by:    Discharge Disposition:  [place]  Per UR Regulation:  Reviewed for med. necessity/level of care/duration of stay

## 2014-03-31 NOTE — Progress Notes (Signed)
[facility] PROGRESS NOTE  Traci [name] PIR:[id number] DOB: 01-16-1946 DOA: 03/26/2014 PCP: Bonnita Nasuti, MD  Assessment/Plan  Comminuted right distal tib/fib fractures s/p ORIF/IM nail of both on 8/11 by Dr. Dr. Lewanda Rife  -  DVT prophylaxis and weight bearing status per orthopedics   Sinus tachycardia present on admission -  Tele:  NSR, HR around 90-100bpm -  Troponin negative -  TSH wnl -  Low suspicion for PE  Recurrent falls/unstable gait secondary to essential tremor/vitamin B 12 deficiency  -  Appreciate neurology assistance -  MRI of the brain showed only non-specific Ellzey matter change  -  Start beta blocker for ET as outpatient once improved from current surgery -  started vitamin B12 supplementation with intramuscular vitamin B12 weekly  -  MMA levels pending  -  Folate level within normal limits  -  Needs EMG as outpatient:  She should follow up as an outpatient with either [facility] [address] [place], [place] [address]--[phone] or [facility] neurology [place], [place] [phone]  Polypharmacy -  Discontinued Robaxin and decrease the dose and frequency of Xanax, oxycodone, morphine on 8/11 -  Continue current doses for now  Tobacco abuse: Cessation counseled. Patient declines nicotine patch.   COPD with some wheezing.   -  Start dulera -  Tobacco cessation counseled -  Continue prn albuterol   -  Patient currently on room air, but recorded as on oxygen  Hyperlipidemia: Continue statins.  Anxiety & depression: Continue home medications (alprazolam Wellbutrin and BuSpar) but will need psychiatry followup as outpatient to manage these appropriately. Currently does not see a psychiatrist.  Hypothyroidism, TSH 1.53 wnl, Continue Synthroid CAD,  nonobstructive disease by left heart cath in 2013 -  Continue statin but recommend transitioning to lipitor or crestor  -  Plan to start BB as outpatient after reevaluating blood  pressure -  Start ASA daily if orthopedics okay with this  Acute blood loss anemia post-operatively.  No need for blood transfusion.    Diet:  reg Access:  PIV IVF:  off Proph:  lovenox  Code Status: full Family Communication: patient alone Disposition Plan: pending PT/OT, likely to rehab in a few days   HPI/Subjective:  Leg feels much better.  Denies SOB, but has chronic smokers cough.  Urinary retention improved. Had BM yesterday.  Denies CP and palpitations  Objective: Filed Vitals:   03/30/14 1818 03/30/14 1832 03/30/14 2104 03/31/14 0536  BP: 124/50 129/52 130/56 127/53  Pulse: 95 96 97 92  Temp: 98.7 F (37.1 C) 98.1 F (36.7 C) 97.9 F (36.6 C) 98.8 F (37.1 C)  TempSrc:  Oral    Resp: 14 14 16 16   Height:      Weight:      SpO2: 95% 91% 94% 95%    Intake/Output Summary (Last 24 hours) at 03/31/14 0931 Last data filed at 03/30/14 1918  Gross per 24 hour  Intake   1920 ml  Output      0 ml  Net   1920 ml   Filed Weights   03/26/14 1053  Weight: 75.297 kg (166 lb)    Exam:   General:  WF, No acute distress  HEENT:  NCAT, MMM  Cardiovascular:  RRR, nl S1, S2 no mrg, 2+ pulses, warm extremities  Respiratory:  Diminished throughout with wheezes, no rales or rhonchi, no increased WOB  Abdomen:   NABS, soft, NT/ND  MSK:   Normal  tone and bulk, 1+ pitting edema toes of right foot with splint in place.  < 2 sec CR, able to wiggle toes, sensation intact.  No LLE edema   Neuro:  Grossly intact  Data Reviewed: Basic Metabolic Panel:  Recent Labs Lab 03/26/14 1211 03/28/14 0509  NA 144 140  K 4.4 4.4  CL 106 100  CO2 26 29  GLUCOSE 110* 102*  BUN 11 11  CREATININE 0.88 0.88  CALCIUM 9.0 8.7   Liver Function Tests:  Recent Labs Lab 03/28/14 0509  AST 14  ALT 10  ALKPHOS 50  BILITOT 0.7  PROT 6.3  ALBUMIN 3.1*   No results found for this basename: LIPASE, AMYLASE,  in the last 168 hours No results found for this basename: AMMONIA,   in the last 168 hours CBC:  Recent Labs Lab 03/26/14 1211 03/28/14 0509 03/30/14 0434  WBC 8.1 8.4 6.5  NEUTROABS 5.8  --   --   HGB 13.4 11.6* 10.8*  HCT 40.9 36.0 33.0*  MCV 99.0 100.0 98.5  PLT 185 164 193   Cardiac Enzymes:  Recent Labs Lab 03/30/14 1942  TROPONINI <0.30   BNP (last 3 results) No results found for this basename: PROBNP,  in the last 8760 hours CBG: No results found for this basename: GLUCAP,  in the last 168 hours  Recent Results (from the past 240 hour(s))  SURGICAL PCR SCREEN     Status: None   Collection Time    03/30/14  5:41 AM      Result Value Ref Range Status   MRSA, PCR NEGATIVE  NEGATIVE Final   Staphylococcus aureus NEGATIVE  NEGATIVE Final   Comment:            The Xpert SA Assay (FDA     approved for NASAL specimens     in patients over 77 years of age),     is one component of     a comprehensive surveillance     program.  Test performance has     been validated by [employer] for patients greater     than or equal to 26 year old.     It is not intended     to diagnose infection nor to     guide or monitor treatment.     Studies: Dg Tibia/fibula Right  03/30/2014   CLINICAL DATA:  Distal tibia and fibular fractures  EXAM: RIGHT TIBIA AND FIBULA - 2 VIEW  COMPARISON:  03/26/2014  FINDINGS: Four spot films were obtained intraoperatively during open reduction internal fixation. A medullary rod is noted in the tibia with both proximal and distal fixation. A medullary rod is also noted within the tibia. The fracture fragments are in near anatomic alignment. 2 min and 20 seconds of fluoroscopy was utilized.   Electronically Signed   By: Inez Catalina M.D.   On: 03/30/2014 16:48   Mr Jeri Cos RF Contrast  03/29/2014   CLINICAL DATA:  Ataxia.  Recent fall.  EXAM: MRI HEAD WITHOUT AND WITH CONTRAST  TECHNIQUE: Multiplanar, multiecho pulse sequences of the brain and surrounding structures were obtained without and with intravenous  contrast.  CONTRAST:  33mL MULTIHANCE GADOBENATE DIMEGLUMINE 529 MG/ML IV SOLN  COMPARISON:  CT head without contrast 03/30/2008  FINDINGS: The diffusion-weighted images demonstrate no evidence for acute or subacute infarction. No hemorrhage or mass lesion is present. Periventricular and subcortical Mccammon matter changes bilaterally are greater than expected for age.  Mild generalized atrophy is present. The ventricles are proportionate to the degree of atrophy. Flow is present in the major intracranial arteries.  No significant extraaxial fluid collection is present. The patient is status post bilateral lens replacements. The paranasal sinuses and mastoid air cells are clear.  The postcontrast images are moderately degraded by patient motion. No pathologic enhancement is evident.  IMPRESSION: 1. No acute intracranial abnormality. 2. Periventricular and subcortical Mcclane matter changes bilaterally are greater than expected for age. The finding is nonspecific but can be seen in the setting of chronic microvascular ischemia, a demyelinating process such as multiple sclerosis, vasculitis, complicated migraine headaches, or as the sequelae of a prior infectious or inflammatory process. 3. Study is mildly degraded by patient motion. This limits sensitivity for small lesions.   Electronically Signed   By: Lawrence Santiago M.D.   On: 03/29/2014 10:49   Dg C-arm 1-60 Min-no Report  03/30/2014   CLINICAL DATA: tibia fracture   C-ARM 1-60 MINUTES  Fluoroscopy was utilized by the requesting physician.  No radiographic  interpretation.     Scheduled Meds: . buPROPion  150 mg Oral Daily  . busPIRone  10 mg Oral q morning - 10a  . cholecalciferol  2,000 Units Oral Daily  . cyanocobalamin  1,000 mcg Intramuscular Daily  . docusate sodium  100 mg Oral BID  . DULoxetine  60 mg Oral Daily  . enoxaparin (LOVENOX) injection  40 mg Subcutaneous Q24H  . levothyroxine  25 mcg Oral QAC breakfast  . pantoprazole  40 mg Oral Daily   . senna  1 tablet Oral BID  . simvastatin  10 mg Oral q1800  . traZODone  300 mg Oral QHS  . [START ON 04/05/2014] vitamin B-12  1,000 mcg Oral Daily   Continuous Infusions:   Active Problems:   Fractured tibia and fibula   Right tibial fracture   Closed right tibial fracture   Unstable gait    Time spent: 30 min    Traci [name], [place]  [facility] Pager [phone]. If 7PM-7AM, please contact night-coverage at [email or web address], password [email or web address] 03/31/2014, 9:31 AM  LOS: 5 days

## 2014-03-31 NOTE — Evaluation (Signed)
Occupational Therapy Evaluation Patient Details Name: Traci [name] MRN: [id number] DOB: 1946/06/23 Today's Date: 03/31/2014    History of Present Illness pt presents after falling and sustaining R Tibia fx post IM nail.     Clinical Impression   Pt admitted with the above diagnoses and presents with below problem list. Pt will benefit from continued acute OT to address the below listed deficits and maximize independence with basic ADLs prior to d/c home with family. PTA pt was independent with ADLs. Pt has h/o falls. Pt currently needing mod-max A for LB ADLs. Pt limited this session by pain and fatigue. Pt needed cueing to maintain NWB status in standing.  ADL education as well as fall prevention tips given to pt and family.     Follow Up Recommendations  Supervision/Assistance - 24 hour;No OT follow up    Equipment Recommendations  Other (comment) (Pt has BSC and rw.)    Recommendations for Other Services       Precautions / Restrictions Precautions Precautions: Fall Precaution Comments: educated NWB status and general fall prevention tips Restrictions Weight Bearing Restrictions: Yes RLE Weight Bearing: Non weight bearing      Mobility Bed Mobility Overal bed mobility: Needs Assistance Bed Mobility: Supine to Sit     Supine to sit: Min assist     General bed mobility comments: pt in recliner  Transfers Overall transfer level: Needs assistance Equipment used: Rolling walker (2 wheeled) Transfers: Sit to/from Stand Sit to Stand: Mod assist         General transfer comment: mod A for powerup and initial steading in standing. Cues needed for technique and to maintain NWB in standing.    Balance Overall balance assessment: History of Falls;Needs assistance Sitting-balance support: No upper extremity supported;Feet supported Sitting balance-Leahy Scale: Fair     Standing balance support: Bilateral upper extremity supported;During functional  activity Standing balance-Leahy Scale: Poor Standing balance comment: pt needs rw to maintain balance                            ADL Overall ADL's : Needs assistance/impaired Eating/Feeding: Set up;Sitting   Grooming: Set up;Sitting   Upper Body Bathing: Set up;Sitting   Lower Body Bathing: Sit to/from stand;Maximal assistance   Upper Body Dressing : Set up;Sitting;Cueing for compensatory techniques   Lower Body Dressing: Maximal assistance;Sit to/from stand;Cueing for compensatory techniques   Toilet Transfer: Stand-pivot;BSC;RW;Moderate assistance   Toileting- Clothing Manipulation and Hygiene: Moderate assistance;Sit to/from stand;Cueing for compensatory techniques   Tub/ Shower Transfer: Moderate assistance;Stand-pivot;3 in 1;Rolling walker   Functional mobility during ADLs: Rolling walker;Moderate assistance General ADL Comments: Educated pt and family on AE and techniques for safe completion of ADLs with NWB status. Pt needing mod cueing to maintain NWB status in standing.      Vision                     Perception     Praxis      Pertinent Vitals/Pain Pain Assessment: 0-10 Pain Score: 6  Pain Location: RLE Pain Descriptors / Indicators: Aching Pain Intervention(s): Limited activity within patient's tolerance;Monitored during session;Utilized relaxation techniques;Ice applied     Hand Dominance     Extremity/Trunk Assessment Upper Extremity Assessment Upper Extremity Assessment: Generalized weakness   Lower Extremity Assessment Lower Extremity Assessment: Defer to PT evaluation RLE Deficits / Details: Ankle/foot casted.  Knee ROM limited by pain.     Cervical /  Trunk Assessment Cervical / Trunk Assessment: Normal   Communication Communication Communication: No difficulties   Cognition Arousal/Alertness: Awake/alert Behavior During Therapy: WFL for tasks assessed/performed Overall Cognitive Status: Within Functional Limits for  tasks assessed                     General Comments       Exercises       Shoulder Instructions      Home Living Family/patient expects to be discharged to:: Private residence Living Arrangements: Spouse/significant other Available Help at Discharge: Family;Available 24 hours/day Type of Home: House Home Access: Stairs to enter [employer] of Steps: 2-4 Entrance Stairs-Rails: None Home Layout: One level     Bathroom Shower/Tub: Walk-in Hydrologist: Standard Bathroom Accessibility: Yes How Accessible: Accessible via walker Home Equipment: [place] - 2 wheels;Bedside commode          Prior Functioning/Environment Level of Independence: Independent        Comments: pt's daughter present during session and stated PTA pt "had been told to use a walker but she refused to"    OT Diagnosis: Generalized weakness;Acute pain   OT Problem List: Decreased strength;Decreased activity tolerance;Impaired balance (sitting and/or standing);Decreased safety awareness;Decreased knowledge of use of DME or AE;Decreased knowledge of precautions;Pain   OT Treatment/Interventions: Self-care/ADL training;Therapeutic exercise;Energy conservation;DME and/or AE instruction;Therapeutic activities;Patient/family education;Balance training    OT Goals(Current goals can be found in the care plan section) Acute Rehab OT Goals Patient Stated Goal: walk better OT Goal Formulation: With patient/family Time For Goal Achievement: 04/07/14 Potential to Achieve Goals: Good ADL Goals Pt Will Perform Lower Body Bathing: with supervision;with adaptive equipment;sit to/from stand Pt Will Perform Lower Body Dressing: with supervision;with adaptive equipment;sit to/from stand Pt Will Transfer to Toilet: with supervision;ambulating;bedside commode Pt Will Perform Toileting - Clothing Manipulation and hygiene: with supervision;with adaptive equipment;sit to/from stand Pt  Will Perform Tub/Shower Transfer: with supervision;ambulating;3 in 1;rolling walker Additional ADL Goal #1: Pt will perform bed mobility at supervision level with adaptive equipment as needed to prepare for OOB ADLs.  OT Frequency: Min 3X/week   Barriers to D/C:            Co-evaluation              End of Session Equipment Utilized During Treatment: Gait belt;Rolling walker  Activity Tolerance: Patient limited by fatigue;Patient limited by pain Patient left: in chair;with call bell/phone within reach;with family/visitor present   Time: 2706-2376 OT Time Calculation (min): 27 min Charges:  OT General Charges $OT Visit: 1 Procedure OT Evaluation $Initial OT Evaluation Tier I: 1 Procedure OT Treatments $Self Care/Home Management : 8-22 mins G-Codes:    Hortencia Pilar 29-Apr-2014, 3:48 PM

## 2014-03-31 NOTE — Progress Notes (Signed)
Subjective: 1 Day Post-Op Procedure(s) (LRB): INTRAMEDULLARY (IM) NAIL TIBIAL (Right) Patient reports pain as mild.  Well controlled with oral oxycodone.  Objective: Vital signs in last 24 hours: Temp:  [97.9 F (36.6 C)-98.8 F (37.1 C)] 98.8 F (37.1 C) (08/12 0536) Pulse Rate:  [92-104] 92 (08/12 0536) Resp:  [12-18] 16 (08/12 0536) BP: (114-141)/(50-63) 127/53 mmHg (08/12 0536) SpO2:  [91 %-100 %] 95 % (08/12 0536)  Intake/Output from previous day: 08/11 0701 - 08/12 0700 In: 1920 [P.O.:160; I.V.:1510; IV Piggyback:250] Out: -  Intake/Output this shift:     Recent Labs  03/30/14 0434  HGB 10.8*    Recent Labs  03/30/14 0434  WBC 6.5  RBC 3.35*  HCT 33.0*  PLT 193   No results found for this basename: NA, K, CL, CO2, BUN, CREATININE, GLUCOSE, CALCIUM,  in the last 72 hours No results found for this basename: LABPT, INR,  in the last 72 hours  PE:  wn wd woman in nad.  R LE splinted. Brisk cap refill at toes.  5/5 strength in PF and DF.  Assessment/Plan: 1 Day Post-Op Procedure(s) (LRB): INTRAMEDULLARY (IM) NAIL TIBIAL (Right) Up with therapy  nwb on R LE.  PT, OT, SW for d/c planning.  Traci [name] 03/31/2014, 7:29 AM

## 2014-03-31 NOTE — Evaluation (Signed)
Physical Therapy Evaluation Patient Details Name: Traci [name] MRN: [id number] DOB: 12-16-1945 Today's Date: 03/31/2014   History of Present Illness  pt presents after falling and sustaining R Tibia fx post IM nail.    Clinical Impression  Lengthy discussion with pt and family arriving at end of session about D/C planning and equipment needs.  Pt plans to D/C to home and will need W/C with elevating leg rests for mobility as she has difficulty mobilizing with RW.  Pt has stairs to enter her home and discussed pt will need family support to A with carrying her in her home while sitting in W/C.  Will continue to follow.      Follow Up Recommendations Home health PT;Supervision/Assistance - 24 hour    Equipment Recommendations  Wheelchair (measurements PT);Wheelchair cushion (measurements PT) (Elevating Leg rest for W/C.  )    Recommendations for Other Services       Precautions / Restrictions Precautions Precautions: Fall Restrictions Weight Bearing Restrictions: Yes RLE Weight Bearing: Non weight bearing      Mobility  Bed Mobility Overal bed mobility: Needs Assistance Bed Mobility: Supine to Sit     Supine to sit: Min assist     General bed mobility comments: A to bring R LE OOB.    Transfers Overall transfer level: Needs assistance Equipment used: Rolling walker (2 wheeled) Transfers: Sit to/from Stand Sit to Stand: Min assist         General transfer comment: cues for UE use and positioning LEs.    Ambulation/Gait Ambulation/Gait assistance: Min assist Ambulation Distance (Feet): 3 Feet Assistive device: Rolling walker (2 wheeled) Gait Pattern/deviations: Step-to pattern   Gait velocity interpretation: Below normal speed for age/gender General Gait Details: pt fatigues very quickly and is only able to go 3' today.  pt does well with maintaining NWBing.    Stairs            Wheelchair Mobility    Modified Rankin (Stroke Patients Only)        Balance Overall balance assessment: Needs assistance Sitting-balance support: No upper extremity supported;Feet supported Sitting balance-Leahy Scale: Good     Standing balance support: Bilateral upper extremity supported Standing balance-Leahy Scale: Poor                               Pertinent Vitals/Pain Pain Assessment: 0-10 Pain Score: 4  Pain Location: R LE Pain Descriptors / Indicators: Sore Pain Intervention(s): Monitored during session;Premedicated before session    Home Living Family/patient expects to be discharged to:: Private residence Living Arrangements: Spouse/significant other Available Help at Discharge: Family;Available 24 hours/day Type of Home: House Home Access: Stairs to enter Entrance Stairs-Rails: None Entrance Stairs-Number of Steps: 2-4 Home Layout: One level Home Equipment: Walker - 2 wheels;Bedside commode      Prior Function Level of Independence: Independent               Hand Dominance        Extremity/Trunk Assessment   Upper Extremity Assessment: Defer to OT evaluation           Lower Extremity Assessment: RLE deficits/detail RLE Deficits / Details: Ankle/foot casted.  Knee ROM limited by pain.      Cervical / Trunk Assessment: Normal  Communication   Communication: No difficulties  Cognition Arousal/Alertness: Awake/alert Behavior During Therapy: WFL for tasks assessed/performed Overall Cognitive Status: Within Functional Limits for tasks assessed  General Comments      Exercises        Assessment/Plan    PT Assessment Patient needs continued PT services  PT Diagnosis Difficulty walking   PT Problem List Decreased strength;Decreased range of motion;Decreased activity tolerance;Decreased balance;Decreased mobility;Decreased knowledge of use of DME;Pain  PT Treatment Interventions DME instruction;Gait training;Stair training;Functional mobility training;Therapeutic  activities;Therapeutic exercise;Balance training;Patient/family education   PT Goals (Current goals can be found in the Care Plan section) Acute Rehab PT Goals Patient Stated Goal: No more falling PT Goal Formulation: With patient Time For Goal Achievement: 04/07/14 Potential to Achieve Goals: Good    Frequency Min 5X/week   Barriers to discharge        Co-evaluation               End of Session Equipment Utilized During Treatment: Gait belt Activity Tolerance: Patient limited by fatigue Patient left: in chair;with call bell/phone within reach Nurse Communication: Mobility status         Time: 4920-1007 PT Time Calculation (min): 28 min   Charges:   PT Evaluation $Initial PT Evaluation Tier I: 1 Procedure PT Treatments $Gait Training: 8-22 mins $Therapeutic Activity: 8-22 mins   PT G CodesCatarina Hartshorn, [place] 03/31/2014, 1:18 PM

## 2014-03-31 NOTE — Clinical Social Work Note (Signed)
CSW met with pt and daughter at bedside.  Pt states that she lives with her husband at home.  Husband is retired and able-bodied and able to provide 24-hour supervision assistance.  RNCM made aware.  Nonnie Done, [place] [phone]  Clinical Social Work

## 2014-04-01 ENCOUNTER — Inpatient Hospital Stay (HOSPITAL_COMMUNITY): Payer: Medicare Other

## 2014-04-01 ENCOUNTER — Encounter (HOSPITAL_COMMUNITY): Payer: Self-pay | Admitting: Orthopedic Surgery

## 2014-04-01 DIAGNOSIS — J9600 Acute respiratory failure, unspecified whether with hypoxia or hypercapnia: Secondary | ICD-10-CM

## 2014-04-01 DIAGNOSIS — J9601 Acute respiratory failure with hypoxia: Secondary | ICD-10-CM

## 2014-04-01 LAB — METHYLMALONIC ACID, SERUM: Methylmalonic Acid, Quantitative: 151 nmol/L (ref 87–318)

## 2014-04-01 MED ORDER — POLYETHYLENE GLYCOL 3350 17 G PO PACK
17.0000 g | PACK | Freq: Every day | ORAL | Status: DC
  Administered 2014-04-01: 17 g via ORAL
  Filled 2014-04-01 (×3): qty 1

## 2014-04-01 MED ORDER — LEVALBUTEROL HCL 1.25 MG/0.5ML IN NEBU
1.2500 mg | INHALATION_SOLUTION | Freq: Three times a day (TID) | RESPIRATORY_TRACT | Status: DC
  Administered 2014-04-01 – 2014-04-02 (×4): 1.25 mg via RESPIRATORY_TRACT
  Filled 2014-04-01 (×6): qty 0.5

## 2014-04-01 MED ORDER — FUROSEMIDE 10 MG/ML IJ SOLN: 20.0000 mg | Freq: Once | INTRAMUSCULAR | Status: DC

## 2014-04-01 MED ORDER — BISACODYL 10 MG RE SUPP: 10.0000 mg | Freq: Once | RECTAL | Status: DC

## 2014-04-01 MED ORDER — FUROSEMIDE 20 MG PO TABS
20.0000 mg | ORAL_TABLET | Freq: Once | ORAL | Status: AC
  Administered 2014-04-02: 20 mg via ORAL
  Filled 2014-04-01: qty 1

## 2014-04-01 NOTE — Progress Notes (Signed)
Physical Therapy Treatment Patient Details Name: Traci [name] MRN: [id number] DOB: [birth year] Today's Date: 04/01/2014    History of Present Illness pt presents after falling and sustaining R Tibia fx post IM nail.      PT Comments    Pt eager for OOB and trialing new W/C.  Reviewed W/C components and propulsion.  Pt progressing well with W/C level mobility today.  Will continue to follow.    Follow Up Recommendations  Home health PT;Supervision/Assistance - 24 hour     Equipment Recommendations  Wheelchair (measurements PT);Wheelchair cushion (measurements PT)    Recommendations for Other Services       Precautions / Restrictions Precautions Precautions: Fall Restrictions Weight Bearing Restrictions: Yes RLE Weight Bearing: Non weight bearing    Mobility  Bed Mobility Overal bed mobility: Needs Assistance Bed Mobility: Supine to Sit     Supine to sit: Min assist     General bed mobility comments: A with R LE only.    Transfers Overall transfer level: Needs assistance Equipment used: None Transfers: Squat Pivot Transfers     Squat pivot transfers: Min assist     General transfer comment: cues for step-by-step through squat pivot bed to W/C and W/C to 3-in-1.  pt pivoted to R side both times.    Ambulation/Gait                 Hotel manager mobility: Yes Wheelchair propulsion: Both upper extremities Wheelchair parts: Needs assistance Distance: 200 Wheelchair Assistance Details (indicate cue type and reason): pt ed on management of W/C leg rests, brakes, and armrests.  pt ed on W/C propulsion and turning technique with pt return demo.    Modified Rankin (Stroke Patients Only)       Balance                                    Cognition Arousal/Alertness: Awake/alert Behavior During Therapy: WFL for tasks assessed/performed Overall Cognitive Status: Within  Functional Limits for tasks assessed                      Exercises      General Comments        Pertinent Vitals/Pain Pain Assessment: 0-10 Pain Score: 6  Pain Location: R LE Pain Descriptors / Indicators: Aching Pain Intervention(s): Premedicated before session;Monitored during session    Home Living                      Prior Function            PT Goals (current goals can now be found in the care plan section) Acute Rehab PT Goals PT Goal Formulation: With patient Time For Goal Achievement: 04/07/14 Potential to Achieve Goals: Good Progress towards PT goals: Progressing toward goals    Frequency  Min 5X/week    PT Plan Current plan remains appropriate    Co-evaluation             End of Session   Activity Tolerance: Patient tolerated treatment well Patient left: with call bell/phone within reach (In bathroom for BM with pull cord)     Time: 2595-6387 PT Time Calculation (min): 55 min  Charges:  $Therapeutic Activity: 8-22 mins $Wheel Chair Management: 23-37 mins  G CodesCatarina Hartshorn, [place] 04/01/2014, 12:13 PM

## 2014-04-01 NOTE — Progress Notes (Addendum)
Subjective: 2 Days Post-Op Procedure(s) (LRB): INTRAMEDULLARY (IM) NAIL TIBIAL (Right) Patient reports pain as moderate.  She states that she feels overall "okay" but has noticed a moderate amount of pain in her RLE. Her pain medications are helpful, but she is still having some breakthrough pain. She otherwise feels well. She has been up with PT and has seen OT. Consult with CW pending. She denies any new complaints or concerns. She denies any new HA, CP, palpitations, SOB, N/V/D, fever, chills, calf swelling or pain. She is eager to go home.   Objective: Vital signs in last 24 hours: Temp:  [97.8 F (36.6 C)-98.3 F (36.8 C)] 98.3 F (36.8 C) (08/13 0521) Pulse Rate:  [93-96] 96 (08/13 0521) Resp:  [16] 16 (08/12 1400) BP: (120-133)/(42-54) 133/42 mmHg (08/13 0521) SpO2:  [87 %-93 %] 87 % (08/13 0900)  Intake/Output from previous day: 08/12 0701 - 08/13 0700 In: 840 [P.O.:840] Out: 625 [Urine:625] Intake/Output this shift:     Recent Labs  03/30/14 0434  HGB 10.8*    Recent Labs  03/30/14 0434  WBC 6.5  RBC 3.35*  HCT 33.0*  PLT 193   No results found for this basename: NA, K, CL, CO2, BUN, CREATININE, GLUCOSE, CALCIUM,  in the last 72 hours No results found for this basename: LABPT, INR,  in the last 72 hours  Physical Exam: WD/WN caucasian female who appears older than her stated age; rests comfortably in bed. A and O x 4. Mood and affect are appropriate. EOMI. Respirations normal and unlabored. NWB on RLE with hard splint applied. Dressings are C/D/I. She is nv intact 5/5 strength of toe extension and flexion. No leg swelling or palpable cords.   Assessment/Plan: 2 Days Post-Op Procedure(s) (LRB): INTRAMEDULLARY (IM) NAIL TIBIAL (Right) NWB RLE; up with PT, OT CM consult for d/c plans--note reviewed; plan for d/c home with home health on 04/02/14 Lovenox for DVT prophylaxis  Continue PO Oxycodone for pain relief  Willamina Grieshop HOWELLS 04/01/2014, 11:33  AM

## 2014-04-01 NOTE — Progress Notes (Signed)
[facility] PROGRESS NOTE  Traci [name]:[id number] DOB: 1946/04/26 DOA: 03/26/2014 PCP: Bonnita Nasuti, MD  Assessment/Plan  Comminuted right distal tib/fib fractures s/p ORIF/IM nail of both on 8/11 by Dr. Dr. Lewanda Rife  -  DVT prophylaxis and weight bearing status per orthopedics   Acute hypoxic respiratory failure with wheezing, ddx includes acute on chronic diastolic heart failure vs. COPD vs. atelectasis   -  Continue dulera -  Start scheduled xopenex -  Tobacco cessation counseled -  Repeat CXR and consider trial of lasix  Sinus tachycardia present on admission, Tele:  NSR, HR around 90-100bpm.  Troponin negative and TSH wnl. -  Low suspicion for PE  Recurrent falls/unstable gait secondary to essential tremor/vitamin B 12 deficiency  -  Appreciate neurology assistance -  MRI of the brain showed only non-specific Delap matter change  -  Start beta blocker for ET as outpatient once improved from current surgery with close monitoring of breathing -  started vitamin B12 supplementation with intramuscular vitamin B12 weekly  -  MMA levels 151 -  Folate level within normal limits  -  Needs EMG as outpatient:  She should follow up as an outpatient with either [facility] [address] [place], [place] [address]--[phone] or [facility] neurology [place], [place] [address] [phone]  Polypharmacy -  Discontinued Robaxin and decrease the dose and frequency of Xanax, oxycodone, morphine on 8/11 -  Continue current doses for now  Tobacco abuse: Cessation counseled. Patient declines nicotine patch.   Hyperlipidemia: Continue statins.   Anxiety & depression: Continue home medications (alprazolam Wellbutrin and BuSpar) but will need psychiatry followup as outpatient to manage these appropriately. Currently does not see a psychiatrist.   Hypothyroidism, TSH 1.53 wnl, Continue Synthroid  CAD,  nonobstructive disease by left heart cath in 2013 -   Continue statin but recommend transitioning to lipitor or crestor  -  Plan to start BB as outpatient after reevaluating blood pressure -  Start ASA daily if orthopedics okay with this  Acute blood loss anemia post-operatively.  No need for blood transfusion.    Diet:  reg Access:  PIV IVF:  off Proph:  lovenox  Code Status: full Family Communication: patient alone Disposition Plan:   To home once oxygen has improved.     HPI/Subjective:  Leg increasingly painful and has sensation of "pressure ulcer" on medial right 1st toe.  Persistent cough and disappointed that her oxygen level dropped this AM  Objective: Filed Vitals:   03/31/14 1400 03/31/14 2041 04/01/14 0521 04/01/14 0900  BP: 120/54 120/42 133/42   Pulse: 93 94 96   Temp: 97.8 F (36.6 C) 98.1 F (36.7 C) 98.3 F (36.8 C)   TempSrc:  Oral Oral   Resp: 16     Height:      Weight:      SpO2: 93% 91% 91% 87%    Intake/Output Summary (Last 24 hours) at 04/01/14 0910 Last data filed at 03/31/14 2000  Gross per 24 hour  Intake    600 ml  Output    625 ml  Net    -25 ml   Filed Weights   03/26/14 1053  Weight: 75.297 kg (166 lb)    Exam:   General:  WF, No acute distress  HEENT:  NCAT, MMM  Cardiovascular:  RRR, nl S1, S2 no mrg, 2+ pulses, warm extremities  Respiratory:  Diminished throughout with wheezes, faint rare rales at bilateral bases, no  rhonchi, no increased WOB  Abdomen:   NABS, soft, NT/ND  MSK:   Normal tone and bulk, 1+ pitting edema toes of right foot with splint in place, warm and injected.  < 2 sec CR, able to wiggle toes, sensation intact.  No LLE edema   Neuro:  Grossly intact  Data Reviewed: Basic Metabolic Panel:  Recent Labs Lab 03/26/14 1211 03/28/14 0509  NA 144 140  K 4.4 4.4  CL 106 100  CO2 26 29  GLUCOSE 110* 102*  BUN 11 11  CREATININE 0.88 0.88  CALCIUM 9.0 8.7   Liver Function Tests:  Recent Labs Lab 03/28/14 0509  AST 14  ALT 10  ALKPHOS 50   BILITOT 0.7  PROT 6.3  ALBUMIN 3.1*   No results found for this basename: LIPASE, AMYLASE,  in the last 168 hours No results found for this basename: AMMONIA,  in the last 168 hours CBC:  Recent Labs Lab 03/26/14 1211 03/28/14 0509 03/30/14 0434  WBC 8.1 8.4 6.5  NEUTROABS 5.8  --   --   HGB 13.4 11.6* 10.8*  HCT 40.9 36.0 33.0*  MCV 99.0 100.0 98.5  PLT 185 164 193   Cardiac Enzymes:  Recent Labs Lab 03/30/14 1942  TROPONINI <0.30   BNP (last 3 results) No results found for this basename: PROBNP,  in the last 8760 hours CBG: No results found for this basename: GLUCAP,  in the last 168 hours  Recent Results (from the past 240 hour(s))  SURGICAL PCR SCREEN     Status: None   Collection Time    03/30/14  5:41 AM      Result Value Ref Range Status   MRSA, PCR NEGATIVE  NEGATIVE Final   Staphylococcus aureus NEGATIVE  NEGATIVE Final   Comment:            The Xpert SA Assay (FDA     approved for NASAL specimens     in patients over 25 years of age),     is one component of     a comprehensive surveillance     program.  Test performance has     been validated by [employer] for patients greater     than or equal to 81 year old.     It is not intended     to diagnose infection nor to     guide or monitor treatment.     Studies: Dg Tibia/fibula Right  03/30/2014   CLINICAL DATA:  Distal tibia and fibular fractures  EXAM: RIGHT TIBIA AND FIBULA - 2 VIEW  COMPARISON:  03/26/2014  FINDINGS: Four spot films were obtained intraoperatively during open reduction internal fixation. A medullary rod is noted in the tibia with both proximal and distal fixation. A medullary rod is also noted within the tibia. The fracture fragments are in near anatomic alignment. 2 min and 20 seconds of fluoroscopy was utilized.   Electronically Signed   By: Inez Catalina M.D.   On: 03/30/2014 16:48   Dg C-arm 1-60 Min-no Report  03/30/2014   CLINICAL DATA: tibia fracture   C-ARM 1-60  MINUTES  Fluoroscopy was utilized by the requesting physician.  No radiographic  interpretation.     Scheduled Meds: . buPROPion  150 mg Oral Daily  . busPIRone  10 mg Oral q morning - 10a  . cholecalciferol  2,000 Units Oral Daily  . cyanocobalamin  1,000 mcg Intramuscular Daily  . docusate sodium  100 mg Oral BID  . DULoxetine  60 mg Oral Daily  . enoxaparin (LOVENOX) injection  40 mg Subcutaneous Q24H  . levothyroxine  25 mcg Oral QAC breakfast  . mometasone-formoterol  2 puff Inhalation BID  . pantoprazole  40 mg Oral Daily  . senna  1 tablet Oral BID  . simvastatin  10 mg Oral q1800  . traZODone  300 mg Oral QHS  . [START ON 04/05/2014] vitamin B-12  1,000 mcg Oral Daily   Continuous Infusions:   Active Problems:   Fractured tibia and fibula   Right tibial fracture   Closed right tibial fracture   Unstable gait   Sinus tachycardia    Time spent: 30 min    Jaquin Coy, [facility] Pager [phone]. If 7PM-7AM, please contact night-coverage at [email or web address], password [facility] 04/01/2014, 9:10 AM  LOS: 6 days

## 2014-04-01 NOTE — Progress Notes (Signed)
Occupational Therapy Treatment Patient Details Name: Traci [name] MRN: [id number] DOB: 06/07/1946 Today's Date: 04/01/2014    History of present illness Traci [name] is a 68 y.o. Female s/p Rt IM nail for tibia fx on 03/30/14.    OT comments  Pt seen today for ADLs and therapeutic exercise. Pt declined OOB activities due to fatigue and encouraged pt to perform therapeutic exercises in bed for increased UB strength. Pt reports that she feels comfortable with transfer technique. Pt would benefit from continued skilled OT for UE strength and safe transfers.    Follow Up Recommendations  Supervision/Assistance - 24 hour;No OT follow up    Equipment Recommendations  None recommended by OT       Precautions / Restrictions Precautions Precautions: Fall Restrictions Weight Bearing Restrictions: Yes RLE Weight Bearing: Non weight bearing       Mobility Bed Mobility               General bed mobility comments: Pt in bed for duration.  Transfers                 General transfer comment: Pt declined OOB activities. Performed ther ex in bed.         ADL Overall ADL's : Needs assistance/impaired     Grooming: Set up;Sitting                                 General ADL Comments: Pt declined OOB activities due to fatigue. Educated pt on importance of therapy in strengthening Bil UEs to help with ADLs as pt will rely on UEs to assist with NWB status. Pt participated in therapeutic exercises.                 Cognition  Arousal/Alertness: Awake/Alert Behavior During Therapy: WFL for tasks assessed/performed Overall Cognitive Status: Within Functional Limits for tasks assessed                         Exercises Other Exercises Other Exercises: Provided pt with handout with UE strengthening for UE, shoulders, and upper back to assist with ADLs. Demonstrated exercises with use of theraband (orange; level 2) and pt completed exercises x5 each  side.            Pertinent Vitals/ Pain       Pain Assessment: No/denies pain         Frequency Min 3X/week     Progress Toward Goals  OT Goals(current goals can now be found in the care plan section)        Plan Discharge plan remains appropriate       End of Session Equipment Utilized During Treatment: Other (comment) (theraband; level 2)   Activity Tolerance Patient limited by fatigue   Patient Left in bed;with call bell/phone within reach;with family/visitor present   Nurse Communication          Time: 7026-3785 OT Time Calculation (min): 14 min  Charges: OT General Charges $OT Visit: 1 Procedure OT Treatments $Therapeutic Exercise: 8-22 mins  Juluis Rainier [phone] 04/01/2014, 4:57 PM

## 2014-04-02 MED ORDER — MOMETASONE FURO-FORMOTEROL FUM 100-5 MCG/ACT IN AERO: 2.0000 | INHALATION_SPRAY | Freq: Two times a day (BID) | RESPIRATORY_TRACT | Status: DC

## 2014-04-02 MED ORDER — ENOXAPARIN SODIUM 40 MG/0.4ML ~~LOC~~ SOLN: 40.0000 mg | SUBCUTANEOUS | Status: DC

## 2014-04-02 MED ORDER — ASPIRIN EC 81 MG PO TBEC: 81.0000 mg | DELAYED_RELEASE_TABLET | Freq: Every day | ORAL | Status: DC

## 2014-04-02 NOTE — Discharge Summary (Signed)
Physician Discharge Summary  Patient ID: Traci [name] MRN: [id number] DOB/AGE: 21-Jul-1946 68 y.o.  Admit date: 03/26/2014 Discharge date: 04/02/2014  Admission Diagnoses: Right closed tibia and fibula fracture, tobacco abuse, sinus tachycardia, COPD.  Discharge Diagnoses:  Active Problems:   COPD   Fractured tibia and fibula   Right tibial fracture   Closed right tibial fracture   Unstable gait   Sinus tachycardia   Acute respiratory failure with hypoxia   Discharged Condition: good  Hospital Course: Ms. Kallio presented to [facility] on 04/26/14 after a fall with a closed fracture of her R tibia and fibula. She underwent operative fixation with IM nail on 03/31/14. She has done well since her surgery and has been up with PT. Her pain is now under control with oral oxycodone. She expresses interest in going home. She denies any new complaints or issues. She denies any HA, CP, palpitations, SOB, N/V, fever, chills, calf pain or swelling.   Consults: cardiology, neurology and internal medicine  Significant Diagnostic Studies: radiology: X-Ray: Rigth complete ankle demonstrating closed Tibia and fibula comminuted fractures.   Treatments: surgery: 03/31/14, IM nail of R tibia.  Discharge Exam: Blood pressure 112/53, pulse 97, temperature 98.4 F (36.9 C), temperature source Oral, resp. rate 18, height 5\' 10"  (1.778 m), weight 75.297 kg (166 lb), SpO2 97.00%. Physical Exam: WD/WN caucasian female who appears older than her stated age; rests comfortably in bed. A and O x 4. Mood and affect are appropriate. EOMI. Respirations normal and unlabored. NWB on RLE with hard splint applied. Dressings are C/D/I. She is nv intact 5/5 strength of toe extension and flexion. No leg swelling or palpable cords.   Disposition: 01-Home or Self Care  Discharge Instructions   Call MD / Call 911    Complete by:  As directed   If you experience chest pain or shortness of breath, CALL 911 and be transported to the  hospital emergency room.  If you develope a fever above 101 F, pus (Pfund drainage) or increased drainage or redness at the wound, or calf pain, call your surgeon's office.     Constipation Prevention    Complete by:  As directed   Drink plenty of fluids.  Prune juice may be helpful.  You may use a stool softener, such as Colace (over the counter) 100 mg twice a day.  Use MiraLax (over the counter) for constipation as needed.     Diet - low sodium heart healthy    Complete by:  As directed      Discharge instructions    Complete by:  As directed   Wylene Simmer, MD [place]  Please read the following information regarding your care after surgery.  Medications  You only need a prescription for the narcotic pain medicine (ex. oxycodone, Percocet, Norco).  All of the other medicines listed below are available over the counter. X acetominophen (Tylenol) 650 mg every 4-6 hours as you need for minor pain X oxycodone as prescribed for moderate to severe pain   Narcotic pain medicine (ex. oxycodone, Percocet, Vicodin) will cause constipation.  To prevent this problem, take the following medicines while you are taking any pain medicine. X docusate sodium (Colace) 100 mg twice a day X senna (Senokot) 2 tablets twice a day  X To help prevent blood clots, take your lovenox as prescribed.  You should also get up every hour while you are awake to move around.    Weight Bearing X Do not bear any  weight on the operated leg or foot.  Cast / Splint / Dressing X Keep your splint or cast clean and dry.  Don't put anything (coat hanger, pencil, etc) down inside of it.  If it gets damp, use a hair dryer on the cool setting to dry it.  If it gets soaked, call the office to schedule an appointment for a cast change.   After your dressing, cast or splint is removed; you may shower, but do not soak or scrub the wound.  Allow the water to run over it, and then gently pat it dry.  Swelling It is  normal for you to have swelling where you had surgery.  To reduce swelling and pain, keep your toes above your nose for at least 3 days after surgery.  It may be necessary to keep your foot or leg elevated for several weeks.  If it hurts, it should be elevated.  Follow Up Call my office at [phone] when you are discharged from the hospital or surgery center to schedule an appointment to be seen two weeks after surgery.  Call my office at [phone] if you develop a fever >101.5 F, nausea, vomiting, bleeding from the surgical site or severe pain.     Increase activity slowly as tolerated    Complete by:  As directed      Non weight bearing    Complete by:  As directed   Laterality:  right  Extremity:  Lower            Medication List    STOP taking these medications       meloxicam 15 MG tablet  Commonly known as:  MOBIC      TAKE these medications       albuterol 108 (90 BASE) MCG/ACT inhaler  Commonly known as:  PROVENTIL HFA;VENTOLIN HFA  Inhale 2 puffs into the lungs every 6 (six) hours as needed for wheezing or shortness of breath.     ALPRAZolam 1 MG tablet  Commonly known as:  XANAX  Take 1 mg by mouth 2 (two) times daily as needed for anxiety.     buPROPion 150 MG 24 hr tablet  Commonly known as:  WELLBUTRIN XL  Take 150 mg by mouth daily.     busPIRone 10 MG tablet  Commonly known as:  BUSPAR  Take 10 mg by mouth every morning.     Cholecalciferol 2000 UNITS Caps  Take 2,000 Units by mouth daily.     cyanocobalamin 1000 MCG tablet  Take 1 tablet (1,000 mcg total) by mouth daily.  Start taking on:  04/05/2014     DSS 100 MG Caps  Take 100 mg by mouth 2 (two) times daily.     DULoxetine 60 MG capsule  Commonly known as:  CYMBALTA  Take 60 mg by mouth daily.     enoxaparin 40 MG/0.4ML injection  Commonly known as:  LOVENOX  Inject 0.4 mLs (40 mg total) into the skin daily.     enoxaparin 40 MG/0.4ML injection  Commonly known as:  LOVENOX   Inject 0.4 mLs (40 mg total) into the skin daily.     levothyroxine 25 MCG tablet  Commonly known as:  SYNTHROID, LEVOTHROID  Take 25 mcg by mouth daily before breakfast.     lovastatin 20 MG tablet  Commonly known as:  MEVACOR  Take 20 mg by mouth Daily.     meclizine 25 MG tablet  Commonly known as:  ANTIVERT  Take 25  mg by mouth daily as needed for dizziness or nausea.     omeprazole 20 MG capsule  Commonly known as:  PRILOSEC  Take 1 capsule (20 mg total) by mouth daily.     oxyCODONE 5 MG immediate release tablet  Commonly known as:  Oxy IR/ROXICODONE  Take 1 tablet (5 mg total) by mouth every 6 (six) hours as needed for moderate pain or severe pain.     oxyCODONE-acetaminophen 5-325 MG per tablet  Commonly known as:  PERCOCET/ROXICET  Take 1 tablet by mouth every 8 (eight) hours as needed for pain.     senna 8.6 MG Tabs tablet  Commonly known as:  SENOKOT  Take 2 tablets (17.2 mg total) by mouth 2 (two) times daily.     traZODone 150 MG tablet  Commonly known as:  DESYREL  Take 300 mg by mouth at bedtime.           Follow-up Information   Follow up with Wylene Simmer, MD In 2 weeks.   Specialty:  Orthopedic Surgery   Contact information:   [address] [place] [address] [phone]       Follow up with [facility]. (Someone from [facility] will contact you concerning start date and time for physical therapy.)    Contact information:   [address] [place] [address] [phone]       Follow up with Wylene Simmer, MD In 2 weeks. (For suture removal)    Specialty:  Orthopedic Surgery   Contact information:   [address] [place] [address] [phone]     NWB RLE Percocet for pain relief Lovenox 40mg  daily for DVT prophylaxis D/C to on 04/02/14 home per case management   Signed: Argelio Granier HOWELLS 04/02/2014, 7:44 AM

## 2014-04-02 NOTE — Care Management Note (Addendum)
CARE MANAGEMENT NOTE 04/02/2014  Patient:  Traci [name], Traci [name]   Account Number:  [id number]  Date Initiated:  03/27/2014  Documentation initiated by:  Ricki Miller  Subjective/Objective Assessment:   67 yr old female admitted s/p fall with right tib/fib fracture.  S/p Right tibia IM Nailing, Right fibula IM Nailing.     Action/Plan:   Case Manager spoke with patient and granddaughter concerning home health and DME needs. Choice offered. Referral called to [facility] [place], [facility] liaison. Patient has rolling walker and 3in1.   Anticipated DC Date:  04/02/2014   Anticipated DC Plan:  [place]  CM consult      PAC Choice  [place]   Choice offered to / List presented to:  C-1 Patient   DME arranged  [place]    DME agency  [place] arranged  [place] RN      [place].   Status of service:  Completed, signed off Medicare Important Message given?  YES (If response is "NO", the following Medicare IM given date fields will be blank) Date Medicare IM given:  03/31/2014 Medicare IM given by:  Ricki Miller Date Additional Medicare IM given:   Additional Medicare IM given by:    Discharge Disposition:  [place]  Per UR Regulation:  Reviewed for med. necessity/level of care/duration of stay

## 2014-04-02 NOTE — Progress Notes (Signed)
[facility] PROGRESS NOTE  Traci [name] RFF:[id number] DOB: 1946-08-05 DOA: 03/26/2014 PCP: Bonnita Nasuti, MD  Assessment/Plan  Comminuted right distal tib/fib fractures s/p ORIF/IM nail of both on 8/11 by Dr. Dr. Lewanda Rife.  - DVT prophylaxis and weight bearing status per orthopedics   Acute hypoxic respiratory failure with wheezing, ddx includes acute on chronic diastolic heart failure vs. COPD vs. atelectasis   -  Given RX for dulera  Sinus tachycardia present on admission, Tele:  NSR, HR around 90-100bpm.  Troponin negative and TSH wnl.  Low suspicion for PE and resolved.    Recurrent falls/unstable gait secondary to essential tremor/vitamin B 12 deficiency.  MRI of the brain showed only non-specific Mummert matter change.  Seen by neurology who recommended starting beta blocker for ET as outpatient once improved from current surgery with close monitoring of breathing, and EMG as outpatient.  She should follow up as an outpatient with either [facility] [address], [place], [place] [address]--[phone] or [facility] neurology [address] [place], [place] [address] [phone].  Suspected B12 deficiency but MMA level was wnl.  Folate was also wnl.    Polypharmacy.  Improved by decreasing sedating medications.    Tobacco abuse: Cessation counseled. Patient declines nicotine patch.   Hyperlipidemia: Continue statins.   Anxiety & depression: Continue home medications (alprazolam Wellbutrin and BuSpar) but will need psychiatry followup as outpatient to manage these appropriately. Currently does not see a psychiatrist.   Hypothyroidism, TSH 1.53 wnl, Continue Synthroid  CAD,  nonobstructive disease by left heart cath in 2013.  Continue statin but recommend transitioning to lipitor or crestor.  Start BB as outpatient.  Recommended daily ASA.    Acute blood loss anemia post-operatively.  No need for blood transfusion.    Disposition Plan:   Stable for discharge to home.      HPI/Subjective:  Breathing improved today, ambulated to BR on RA without SOB.    Objective: Filed Vitals:   04/01/14 2035 04/01/14 2058 04/02/14 0609 04/02/14 0846  BP:  97/73 112/53   Pulse: 94 96 97   Temp:  98.5 F (36.9 C) 98.4 F (36.9 C)   TempSrc:  Oral Oral   Resp: 16 16 18    Height:      Weight:      SpO2:  97% 97% 91%    Intake/Output Summary (Last 24 hours) at 04/02/14 0923 Last data filed at 04/02/14 2263  Gross per 24 hour  Intake    840 ml  Output      0 ml  Net    840 ml   Filed Weights   03/26/14 1053  Weight: 75.297 kg (166 lb)    Exam:   General:  WF, No acute distress  HEENT:  NCAT, MMM  Cardiovascular:  RRR, nl S1, S2 no mrg, 2+ pulses, warm extremities  Respiratory:  Diminished throughout.  No W/R/R.  no increased WOB  Abdomen:   NABS, soft, NT/ND  MSK:   Normal tone and bulk, 1+ pitting edema toes of right foot with splint in place, warm and injected.  < 2 sec CR, able to wiggle toes, sensation intact.  No LLE edema   Neuro:  Grossly intact  Data Reviewed: Basic Metabolic Panel:  Recent Labs Lab 03/26/14 1211 03/28/14 0509  NA 144 140  K 4.4 4.4  CL 106 100  CO2 26 29  GLUCOSE 110* 102*  BUN 11 11  CREATININE 0.88 0.88  CALCIUM 9.0 8.7  Liver Function Tests:  Recent Labs Lab 03/28/14 0509  AST 14  ALT 10  ALKPHOS 50  BILITOT 0.7  PROT 6.3  ALBUMIN 3.1*   No results found for this basename: LIPASE, AMYLASE,  in the last 168 hours No results found for this basename: AMMONIA,  in the last 168 hours CBC:  Recent Labs Lab 03/26/14 1211 03/28/14 0509 03/30/14 0434  WBC 8.1 8.4 6.5  NEUTROABS 5.8  --   --   HGB 13.4 11.6* 10.8*  HCT 40.9 36.0 33.0*  MCV 99.0 100.0 98.5  PLT 185 164 193   Cardiac Enzymes:  Recent Labs Lab 03/30/14 1942  TROPONINI <0.30   BNP (last 3 results) No results found for this basename: PROBNP,  in the last 8760 hours CBG: No results found for this basename: GLUCAP,  in  the last 168 hours  Recent Results (from the past 240 hour(s))  SURGICAL PCR SCREEN     Status: None   Collection Time    03/30/14  5:41 AM      Result Value Ref Range Status   MRSA, PCR NEGATIVE  NEGATIVE Final   Staphylococcus aureus NEGATIVE  NEGATIVE Final   Comment:            The Xpert SA Assay (FDA     approved for NASAL specimens     in patients over 96 years of age),     is one component of     a comprehensive surveillance     program.  Test performance has     been validated by [employer] for patients greater     than or equal to 45 year old.     It is not intended     to diagnose infection nor to     guide or monitor treatment.     Studies: Dg Chest Port 1 View  04/01/2014   CLINICAL DATA:  Shortness of breath.  Irregular heart rate.  EXAM: PORTABLE CHEST - 1 VIEW  COMPARISON:  Chest x-ray 11/11/2013.  FINDINGS: 8 mm calcified granuloma in the left mid lung is unchanged over numerous prior examinations dating back at least to 2011 and is considered benign. Cephalization of the pulmonary vasculature. No acute consolidative airspace disease. No pleural effusions. Heart size and upper mediastinal contours are within normal limits. Atherosclerosis in the thoracic aorta.  IMPRESSION: 1. Pulmonary venous congestion, without frank pulmonary edema. 2. Atherosclerosis. 3. 8 mm calcified granuloma in the left mid lung is unchanged and benign.   Electronically Signed   By: Vinnie Langton M.D.   On: 04/01/2014 18:39    Scheduled Meds: . bisacodyl  10 mg Rectal Once  . buPROPion  150 mg Oral Daily  . busPIRone  10 mg Oral q morning - 10a  . cholecalciferol  2,000 Units Oral Daily  . cyanocobalamin  1,000 mcg Intramuscular Daily  . docusate sodium  100 mg Oral BID  . DULoxetine  60 mg Oral Daily  . enoxaparin (LOVENOX) injection  40 mg Subcutaneous Q24H  . levalbuterol  1.25 mg Nebulization TID  . levothyroxine  25 mcg Oral QAC breakfast  . mometasone-formoterol  2 puff  Inhalation BID  . pantoprazole  40 mg Oral Daily  . polyethylene glycol  17 g Oral Daily  . senna  1 tablet Oral BID  . simvastatin  10 mg Oral q1800  . traZODone  300 mg Oral QHS  . [START ON 04/05/2014] vitamin B-12  1,000 mcg Oral Daily   Continuous Infusions:   Active Problems:   COPD   Fractured tibia and fibula   Right tibial fracture   Closed right tibial fracture   Unstable gait   Sinus tachycardia   Acute respiratory failure with hypoxia    Time spent: 30 min    Tahjay Binion, [facility] Pager [phone]. If 7PM-7AM, please contact night-coverage at [email or web address], password [facility] 04/02/2014, 9:23 AM  LOS: 7 days

## 2014-04-02 NOTE — Care Management Note (Signed)
04/02/14 [place], RN BSN Case Manager Patient has requested rolling walker and 3in1. Initially had informed CM that she had walker and 3in1 at home. DME requested for discharge.

## 2014-04-02 NOTE — Progress Notes (Signed)
[facility]  Patient Status: New  [facility] is providing the following services: PT  If patient discharges after hours, please call [phone].   Consepcion Hearing 04/02/2014, 11:13 AM

## 2014-04-06 NOTE — ED Provider Notes (Signed)
Medical screening examination/treatment/procedure(s) were performed by non-physician practitioner and as supervising physician I was immediately available for consultation/collaboration.   EKG Interpretation None        Doyal Saric, MD 04/06/14 1505 

## 2014-07-13 ENCOUNTER — Ambulatory Visit (INDEPENDENT_AMBULATORY_CARE_PROVIDER_SITE_OTHER): Payer: Medicare Other | Admitting: Neurology

## 2014-07-13 ENCOUNTER — Encounter: Payer: Self-pay | Admitting: Neurology

## 2014-07-13 VITALS — BP 115/81 | HR 62 | Ht 69.0 in | Wt 160.0 lb

## 2014-07-13 DIAGNOSIS — R251 Tremor, unspecified: Secondary | ICD-10-CM

## 2014-07-13 MED ORDER — PRIMIDONE 50 MG PO TABS: 50.0000 mg | ORAL_TABLET | Freq: Three times a day (TID) | ORAL | Status: DC

## 2014-07-13 NOTE — Progress Notes (Signed)
PATIENT: Traci [name] DOB: 11/15/1945  HISTORICAL  Traci [name] is a 68 years old right-handed female, referred by her primary care physician Dr. Jannette Fogo for evaluation of bilateral hands tremor  She has past medical history of depression anxiety, suffered a right ankle fracture in August 2015, Require surgery, now  Wear a right boot, Came in with a wheelchair.  She complains of 3 years history of intermittent bilateral hands tremor, getting worse since her fracture , she described difficulty holding a cup of coffee, writing, using utensils . Her tremor involved both hands , she also complains of bilateral leg shaking, sometimes her legs give away underneath her. She sometimes has shaky voice.  Her niece, and her niece children suffers similar tremort   She denied loss sense of smell, she denies REM sleep disorder, taking trazodone for insomnia  Laboratory evaluation in August 2015 showed normal TSH, mildly low B12 220s, normal MMA.    REVIEW OF SYSTEMS: Full 14 system review of systems performed and notable only for appetite change, fatigue, hearing loss, trouble swallowing, cough, wheezing, shortness of breath, restless leg, snoring, joint pain, back pain, aching muscles, walking difficulty, neck pain, wounds, bruise bleed easily, memory loss, dizziness, headache, numbness, speech difficulty, weakness, tremors, confusion, depression, nervousness  ALLERGIES: Allergies  Allergen Reactions  . Cephalexin Hives  . Cymbalta [Duloxetine Hcl] Other (See Comments)    Sees spiders  . Latex     " MAKES ME ITCHY "     HOME MEDICATIONS: Current Outpatient Prescriptions on File Prior to Visit  Medication Sig Dispense Refill  . albuterol (PROVENTIL HFA;VENTOLIN HFA) 108 (90 BASE) MCG/ACT inhaler Inhale 2 puffs into the lungs every 6 (six) hours as needed for wheezing or shortness of breath.    . ALPRAZolam (XANAX) 1 MG tablet Take 1 mg by mouth 2 (two) times daily as needed for anxiety.      Marland Kitchen aspirin EC 81 MG tablet Take 1 tablet (81 mg total) by mouth daily. 30 tablet 0  . buPROPion (WELLBUTRIN XL) 150 MG 24 hr tablet Take 150 mg by mouth 2 (two) times daily.     . busPIRone (BUSPAR) 10 MG tablet Take 10 mg by mouth every morning.    . Cholecalciferol 2000 UNITS CAPS Take 2,000 Units by mouth daily.    Marland Kitchen docusate sodium 100 MG CAPS Take 100 mg by mouth 2 (two) times daily. 10 capsule 0  . enoxaparin (LOVENOX) 40 MG/0.4ML injection Inject 0.4 mLs (40 mg total) into the skin daily. 16 mL 0  . meclizine (ANTIVERT) 25 MG tablet Take 25 mg by mouth daily as needed for dizziness or nausea.    . mometasone-formoterol (DULERA) 100-5 MCG/ACT AERO Inhale 2 puffs into the lungs 2 (two) times daily. 1 Inhaler 0  . oxyCODONE (OXY IR/ROXICODONE) 5 MG immediate release tablet Take 1 tablet (5 mg total) by mouth every 6 (six) hours as needed for moderate pain or severe pain. 30 tablet 0  . oxyCODONE-acetaminophen (PERCOCET/ROXICET) 5-325 MG per tablet Take 1 tablet by mouth every 8 (eight) hours as needed for pain.    Marland Kitchen senna (SENOKOT) 8.6 MG TABS tablet Take 2 tablets (17.2 mg total) by mouth 2 (two) times daily. 120 each 0  . vitamin B-12 1000 MCG tablet Take 1 tablet (1,000 mcg total) by mouth daily. 30 tablet 0   No current facility-administered medications on file prior to visit.    PAST MEDICAL HISTORY: Past Medical History  Diagnosis  Date  . Hyperlipidemia   . Anxiety   . Depression   . Dyspepsia   . Hypertension   . Anginal pain   . History of bronchitis   . History of blood transfusion 1963    w/childbirth  . Shortness of breath 02/12/12    "all the time"  . H/O hiatal hernia   . Chronic lower back pain   . GERD (gastroesophageal reflux disease)   . COPD (chronic obstructive pulmonary disease)   . Headache(784.0)   . Arthritis   . Tremor     PAST SURGICAL HISTORY: Past Surgical History  Procedure Laterality Date  . US echocardiography  12/17/2007    EF 55-60%  .  Vaginal hysterectomy  1980's  . Cholecystectomy  2010  . Cardiac catheterization  04/19/1995    EF 65-70%  . Lumbar disc surgery  early 2000's  . Cataract extraction w/ intraocular lens  implant, bilateral  2012  . Back surgery    . Tibia im nail insertion Right 03/30/2014    Procedure: INTRAMEDULLARY (IM) NAIL TIBIAL;  Surgeon: Wylene Simmer, MD;  Location: [facility];  Service: Orthopedics;  Laterality: Right;    FAMILY HISTORY: Family History  Problem Relation Age of Onset  . Heart attack Father     SOCIAL HISTORY:  History   Social History  . Marital Status: Married    Spouse Name: Mannie    Number of Children: 2  . Years of Education: GED   Occupational History  . Not on file.   Social History Main Topics  . Smoking status: Current Every Day Smoker -- 1.00 packs/day for 50 years    Types: Cigarettes  . Smokeless tobacco: Never Used  . Alcohol Use: No  . Drug Use: No  . Sexual Activity: Yes   Other Topics Concern  . Not on file   Social History Narrative   Patient lives at home with her husband [facility]). Two children.   Retired.   Education GED.   Right handed.   Caffeine Four cups of coffee daily.     PHYSICAL EXAM   Filed Vitals:   07/13/14 1345  BP: 115/81  Pulse: 62  Height: 5\' 9"  (1.753 m)  Weight: 160 lb (72.576 kg)   Body mass index is 23.62 kg/(m^2).   Generalized: In no acute distress  Neck: Supple, no carotid bruits   Cardiac: Regular rate rhythm  Pulmonary: Clear to auscultation bilaterally  Musculoskeletal: No deformity  Neurological examination  Mentation: Alert oriented to time, place, history taking, and causual conversation  Cranial nerve II-XII: Pupils were equal round reactive to light. Extraocular movements were full.  Visual field were full on confrontational test. Bilateral fundi were sharp.  Facial sensation and strength were normal. Hearing was intact to finger rubbing bilaterally. Uvula tongue midline.  Head turning and  shoulder shrug and were normal and symmetric.Tongue protrusion into cheek strength was normal.  Motor: Normal tone, bulk and strength, bilateral hands postural tremor, no rigidity, no bradykinesia.  Sensory: Intact to fine touch, pinprick, preserved vibratory sensation, and proprioception at toes.  Coordination: Normal finger to nose, heel-to-shin bilaterally there was no truncal ataxia  Gait: deferred  Romberg signs: Negative  Deep tendon reflexes: Brachioradialis 2/2, biceps 2/2, triceps 2/2, patellar 2/2, Achilles 0/2, plantar responses were flexor bilaterally.   DIAGNOSTIC DATA (LABS, IMAGING, TESTING) - I reviewed patient records, labs, notes, testing and imaging myself where available.  Lab Results  Component Value Date   WBC 6.5 03/30/2014  HGB 10.8* 03/30/2014   HCT 33.0* 03/30/2014   MCV 98.5 03/30/2014   PLT 193 03/30/2014      Component Value Date/Time   NA 140 03/28/2014 0509   K 4.4 03/28/2014 0509   CL 100 03/28/2014 0509   CO2 29 03/28/2014 0509   GLUCOSE 102* 03/28/2014 0509   BUN 11 03/28/2014 0509   CREATININE 0.88 03/28/2014 0509   CALCIUM 8.7 03/28/2014 0509   PROT 6.3 03/28/2014 0509   ALBUMIN 3.1* 03/28/2014 0509   AST 14 03/28/2014 0509   ALT 10 03/28/2014 0509   ALKPHOS 50 03/28/2014 0509   BILITOT 0.7 03/28/2014 0509   GFRNONAA 66* 03/28/2014 0509   GFRAA 76* 03/28/2014 0509   Lab Results  Component Value Date   CHOL 139 09/24/2011   HDL 56.30 09/24/2011   LDLCALC 66 09/24/2011   TRIG 85.0 09/24/2011   CHOLHDL 2 09/24/2011   Lab Results  Component Value Date   HGBA1C 5.3 03/28/2014   Lab Results  Component Value Date   VITAMINB12 228 03/27/2014   Lab Results  Component Value Date   TSH 1.530 03/27/2014     ASSESSMENT AND PLAN  ENZLEY KITCHENS is a 68 y.o. female complains of  3 years history of gradual onset bilateral hands tremor, sensory of similar tremor, on examinations, she has bilateral hands postural tremor, head  titubation, no parkinsonianism features  1. Most consistent with essential tremor 2. Primidone 50 mg 3 times a day 3. RTC in 2-3 months   Marcial Pacas, M.D. Ph.D.  [facility] [address] [place], [place] [address] [phone]

## 2014-07-29 ENCOUNTER — Encounter (HOSPITAL_COMMUNITY): Payer: Self-pay | Admitting: Cardiovascular Disease

## 2014-09-16 ENCOUNTER — Encounter: Payer: Self-pay | Admitting: Neurology

## 2014-09-16 ENCOUNTER — Ambulatory Visit (INDEPENDENT_AMBULATORY_CARE_PROVIDER_SITE_OTHER): Payer: Medicare Other | Admitting: Neurology

## 2014-09-16 VITALS — BP 120/82 | HR 72 | Ht 69.0 in | Wt 160.0 lb

## 2014-09-16 DIAGNOSIS — R251 Tremor, unspecified: Secondary | ICD-10-CM

## 2014-09-16 MED ORDER — PRIMIDONE 50 MG PO TABS: 100.0000 mg | ORAL_TABLET | Freq: Three times a day (TID) | ORAL | Status: DC

## 2014-09-16 NOTE — Progress Notes (Signed)
PATIENT: Traci [name] DOB: 04/02/1946  HISTORICAL  Traci [name] is a 69 years old right-handed female, referred by her primary care physician Dr. Jannette [name] for evaluation of bilateral hands tremor  She has past medical history of depression anxiety, suffered a right ankle fracture in August 2015, require surgery, now  Wear a right boot, Came in with a wheelchair.  She complains of 3 years history of intermittent bilateral hands tremor, getting worse since her fracture , she described difficulty holding a cup of coffee, writing, using utensils . Her tremor involved both hands , she also complains of bilateral leg shaking, sometimes her legs give away underneath her. She sometimes has shaky voice.  Her sister, and her niece children suffers similar tremort   She denied loss sense of smell, she denies REM sleep disorder, taking trazodone for insomnia  Laboratory evaluation in August 2015 showed normal TSH, mildly low B12 220s, normal MMA  UPDATE Jan 28th 2016:  [name] is taking primidone 50 mg 3 times a day, reported 50 % imrpovement, but noticed wearing off after 2 hours, she denies significant side effect   REVIEW OF SYSTEMS: Full 14 system review of systems performed and notable only for appetite change, fatigue, hearing loss, trouble swallowing, cough, wheezing, shortness of breath, restless leg, snoring, joint pain, back pain, aching muscles, walking difficulty, neck pain, wounds, bruise bleed easily, memory loss, dizziness, headache, numbness, speech difficulty, weakness, tremors, confusion, depression, nervousness  ALLERGIES: Allergies  Allergen Reactions  . Cephalexin Hives  . Cymbalta [Duloxetine Hcl] Other (See Comments)    Sees spiders  . Latex     " MAKES ME ITCHY "     HOME MEDICATIONS: Current Outpatient Prescriptions on File Prior to Visit  Medication Sig Dispense Refill  . albuterol (PROVENTIL HFA;VENTOLIN HFA) 108 (90 BASE) MCG/ACT inhaler Inhale 2 puffs into the  lungs every 6 (six) hours as needed for wheezing or shortness of breath.    . ALPRAZolam (XANAX) 1 MG tablet Take 1 mg by mouth 2 (two) times daily as needed for anxiety.     Marland Kitchen aspirin EC 81 MG tablet Take 1 tablet (81 mg total) by mouth daily. 30 tablet 0  . buPROPion (WELLBUTRIN XL) 150 MG 24 hr tablet Take 150 mg by mouth 2 (two) times daily.     . busPIRone (BUSPAR) 10 MG tablet Take 10 mg by mouth every morning.    . Cholecalciferol 2000 UNITS CAPS Take 2,000 Units by mouth daily.    Marland Kitchen docusate sodium 100 MG CAPS Take 100 mg by mouth 2 (two) times daily. 10 capsule 0  . meclizine (ANTIVERT) 25 MG tablet Take 25 mg by mouth daily as needed for dizziness or nausea.    Marland Kitchen oxyCODONE-acetaminophen (PERCOCET) 10-325 MG per tablet 10 tablets as directed.    . primidone (MYSOLINE) 50 MG tablet Take 1 tablet (50 mg total) by mouth 3 (three) times daily. 90 tablet 6  . senna (SENOKOT) 8.6 MG TABS tablet Take 2 tablets (17.2 mg total) by mouth 2 (two) times daily. 120 each 0  . vitamin B-12 1000 MCG tablet Take 1 tablet (1,000 mcg total) by mouth daily. 30 tablet 0   No current facility-administered medications on file prior to visit.    PAST MEDICAL HISTORY: Past Medical History  Diagnosis Date  . Hyperlipidemia   . Anxiety   . Depression   . Dyspepsia   . Hypertension   . Anginal pain   . History  of bronchitis   . History of blood transfusion 1963    w/childbirth  . Shortness of breath 02/12/12    "all the time"  . H/O hiatal hernia   . Chronic lower back pain   . GERD (gastroesophageal reflux disease)   . COPD (chronic obstructive pulmonary disease)   . Headache(784.0)   . Arthritis   . Tremor     PAST SURGICAL HISTORY: Past Surgical History  Procedure Laterality Date  . US echocardiography  12/17/2007    EF 55-60%  . Vaginal hysterectomy  1980's  . Cholecystectomy  2010  . Cardiac catheterization  04/19/1995    EF 65-70%  . Lumbar disc surgery  early 2000's  . Cataract  extraction w/ intraocular lens  implant, bilateral  2012  . Back surgery    . Tibia im nail insertion Right 03/30/2014    Procedure: INTRAMEDULLARY (IM) NAIL TIBIAL;  Surgeon: Wylene Simmer, MD;  Location: [facility];  Service: Orthopedics;  Laterality: Right;  . Left heart catheterization with coronary angiogram N/A 02/13/2012    Procedure: LEFT HEART CATHETERIZATION WITH CORONARY ANGIOGRAM;  Surgeon: Thayer Headings, MD;  Location: [facility] C[facility] LAB;  Service: Cardiovascular;  Laterality: N/A;    FAMILY HISTORY: Family History  Problem Relation Age of Onset  . Heart attack Father     SOCIAL HISTORY:  History   Social History  . Marital Status: Married    Spouse Name: Traci [name]    Number of Children: 2  . Years of Education: GED   Occupational History  . Not on file.   Social History Main Topics  . Smoking status: Current Every Day Smoker -- 1.00 packs/day for 50 years    Types: Cigarettes  . Smokeless tobacco: Never Used  . Alcohol Use: No  . Drug Use: No  . Sexual Activity: Yes   Other Topics Concern  . Not on file   Social History Narrative   Patient lives at home with her husband [facility] Two children.   Retired.   Education GED.   Right handed.   Caffeine Four cups of coffee daily.     PHYSICAL EXAM   Filed Vitals:   09/16/14 1526  BP: 120/82  Pulse: 72  Height: 5\' 9"  (1.753 m)  Weight: 160 lb (72.576 kg)   Body mass index is 23.62 kg/(m^2).   Generalized: In no acute distress  Neck: Supple, no carotid bruits   Cardiac: Regular rate rhythm  Pulmonary: Clear to auscultation bilaterally  Musculoskeletal: No deformity  Neurological examination  Mentation: Alert oriented to time, place, history taking, and causual conversation  Cranial nerve II-XII: Pupils were equal round reactive to light. Extraocular movements were full.  Visual field were full on confrontational test. Bilateral fundi were sharp.  Facial sensation and strength were normal. Hearing was  intact to finger rubbing bilaterally. Uvula tongue midline.  Head turning and shoulder shrug and were normal and symmetric.Tongue protrusion into cheek strength was normal.  Motor: Normal tone, bulk and strength, bilateral hands postural tremor, no rigidity, no bradykinesia.  Sensory: Intact to fine touch, pinprick, preserved vibratory sensation, and proprioception at toes.  Coordination: Normal finger to nose, heel-to-shin bilaterally there was no truncal ataxia  Gait: deferred  Romberg signs: Negative  Deep tendon reflexes: Brachioradialis 2/2, biceps 2/2, triceps 2/2, patellar 2/2, Achilles 0/2, plantar responses were flexor bilaterally.   DIAGNOSTIC DATA (LABS, IMAGING, TESTING) - I reviewed patient records, labs, notes, testing and imaging myself where available.  Lab Results  Component Value Date   WBC 6.5 03/30/2014   HGB 10.8* 03/30/2014   HCT 33.0* 03/30/2014   MCV 98.5 03/30/2014   PLT 193 03/30/2014      Component Value Date/Time   NA 140 03/28/2014 0509   K 4.4 03/28/2014 0509   CL 100 03/28/2014 0509   CO2 29 03/28/2014 0509   GLUCOSE 102* 03/28/2014 0509   BUN 11 03/28/2014 0509   CREATININE 0.88 03/28/2014 0509   CALCIUM 8.7 03/28/2014 0509   PROT 6.3 03/28/2014 0509   ALBUMIN 3.1* 03/28/2014 0509   AST 14 03/28/2014 0509   ALT 10 03/28/2014 0509   ALKPHOS 50 03/28/2014 0509   BILITOT 0.7 03/28/2014 0509   GFRNONAA 66* 03/28/2014 0509   GFRAA 76* 03/28/2014 0509   Lab Results  Component Value Date   CHOL 139 09/24/2011   HDL 56.30 09/24/2011   LDLCALC 66 09/24/2011   TRIG 85.0 09/24/2011   CHOLHDL 2 09/24/2011   Lab Results  Component Value Date   HGBA1C 5.3 03/28/2014   Lab Results  Component Value Date   VITAMINB12 228 03/27/2014   Lab Results  Component Value Date   TSH 1.530 03/27/2014     ASSESSMENT AND PLAN  MALLORI ARAQUE is a 69 y.o. female complains of  3 years history of gradual onset bilateral hands tremor, sensory of  similar tremor, on examinations, she has bilateral hands postural tremor, head titubation, no parkinsonianism features  1. Most consistent with essential tremor 2. Increase Primidone 50 mg to 2 tabs 3 times a day 3. RTC in 2-3 months  No orders of the defined types were placed in this encounter.   Return in about 3 months (around 12/16/2014). Marcial Pacas, M.D. Ph.D.  [facility] [address], [place], [place] [address] [phone]

## 2014-12-16 ENCOUNTER — Ambulatory Visit: Payer: Medicare Other | Admitting: Neurology

## 2016-08-01 ENCOUNTER — Encounter: Payer: Self-pay | Admitting: Pulmonary Disease

## 2016-08-01 ENCOUNTER — Ambulatory Visit (INDEPENDENT_AMBULATORY_CARE_PROVIDER_SITE_OTHER): Payer: Medicare Other | Admitting: Pulmonary Disease

## 2016-08-01 ENCOUNTER — Ambulatory Visit (INDEPENDENT_AMBULATORY_CARE_PROVIDER_SITE_OTHER)
Admission: RE | Admit: 2016-08-01 | Discharge: 2016-08-01 | Disposition: A | Discharge status: Home / Self Care | Payer: Medicare Other | Source: Ambulatory Visit | Attending: Pulmonary Disease | Admitting: Pulmonary Disease

## 2016-08-01 VITALS — BP 116/74 | HR 78 | Ht 69.0 in | Wt 159.6 lb

## 2016-08-01 DIAGNOSIS — F17210 Nicotine dependence, cigarettes, uncomplicated: Secondary | ICD-10-CM | POA: Diagnosis not present

## 2016-08-01 DIAGNOSIS — J449 Chronic obstructive pulmonary disease, unspecified: Secondary | ICD-10-CM

## 2016-08-01 DIAGNOSIS — F17200 Nicotine dependence, unspecified, uncomplicated: Secondary | ICD-10-CM

## 2016-08-01 MED ORDER — INDACATEROL-GLYCOPYRROLATE 27.5-15.6 MCG IN CAPS: 1.0000 | ORAL_CAPSULE | Freq: Two times a day (BID) | RESPIRATORY_TRACT | 3 refills | Status: DC

## 2016-08-01 MED ORDER — INDACATEROL-GLYCOPYRROLATE 27.5-15.6 MCG IN CAPS: 1.0000 | ORAL_CAPSULE | Freq: Two times a day (BID) | RESPIRATORY_TRACT | 0 refills | Status: DC

## 2016-08-01 NOTE — Patient Instructions (Signed)
Will schedule for pulmonary function tests and a chest x-ray Will start you on utibron inhaler. Continue using the albuterol rescue inhaler. We will refer you for low-dose screening CTs of the chest.  Return to clinic in 1-2 months.

## 2016-08-01 NOTE — Progress Notes (Signed)
Patient seen in the office today and instructed on use of Utibron.  Patient expressed understanding and demonstrated technique. Benetta Spar [facility] 08/01/2016

## 2016-08-01 NOTE — Progress Notes (Signed)
Traci [name]    QL:[id number]    [birth year]  Primary Care Physician:HAGUE, Rosalyn Charters, MD  Referring Physician: Raelyn Number, MD [address] [place],  [address]  Chief complaint:  Consult for evaluation of dyspnea.  HPI: Traci [name] is a 70 year old with past medical history of hypertension, hyperlipidemia, anxiety, heavy active smoking, tremors. She has complains of worsening dyspnea at rest and activity for the past 3-4 years. She has chronic cough with daily sputum production, daily wheezing. She is just on albuterol when necessary which she uses up to 2-3 times every day. She denies any nighttime awakenings, chest pain, palpitations, fevers, chills. She has back, spine pain when she coughs.  She is a 50-pack-year smoking history. She continues to smoke although she is trying to quit and has cut down to 3 cigarettes per day. She used to work in a [employer] and has been exposed to fiber and dust. There are no known exposures to asbestos.  Outpatient Encounter Prescriptions as of 08/01/2016  Medication Sig  . albuterol (PROVENTIL HFA;VENTOLIN HFA) 108 (90 BASE) MCG/ACT inhaler Inhale 2 puffs into the lungs every 6 (six) hours as needed for wheezing or shortness of breath.  . ALPRAZolam (XANAX) 1 MG tablet Take 1 mg by mouth 2 (two) times daily as needed for anxiety.   Marland Kitchen buPROPion (WELLBUTRIN XL) 150 MG 24 hr tablet Take 150 mg by mouth 2 (two) times daily.   . busPIRone (BUSPAR) 10 MG tablet Take 10 mg by mouth 2 (two) times daily.   . Cholecalciferol 2000 UNITS CAPS Take 2,000 Units by mouth daily.  Marland Kitchen levothyroxine (SYNTHROID, LEVOTHROID) 25 MCG tablet 1 day or 1 dose.  . oxyCODONE-acetaminophen (PERCOCET) 10-325 MG per tablet 10 tablets as directed.  . primidone (MYSOLINE) 50 MG tablet Take 2 tablets (100 mg total) by mouth 3 (three) times daily.  . traZODone (DESYREL) 150 MG tablet 3 (three) times daily with meals as needed.  . vitamin B-12 1000 MCG tablet  Take 1 tablet (1,000 mcg total) by mouth daily.  . [DISCONTINUED] aspirin EC 81 MG tablet Take 1 tablet (81 mg total) by mouth daily.  . [DISCONTINUED] docusate sodium 100 MG CAPS Take 100 mg by mouth 2 (two) times daily.  . [DISCONTINUED] meclizine (ANTIVERT) 25 MG tablet Take 25 mg by mouth daily as needed for dizziness or nausea.  . [DISCONTINUED] omeprazole (PRILOSEC) 20 MG capsule 1 day or 1 dose.  . [DISCONTINUED] senna (SENOKOT) 8.6 MG TABS tablet Take 2 tablets (17.2 mg total) by mouth 2 (two) times daily.   No facility-administered encounter medications on file as of 08/01/2016.     Allergies as of 08/01/2016 - Review Complete 08/01/2016  Allergen Reaction Noted  . Cephalexin Hives   . Cymbalta [duloxetine hcl] Other (See Comments) 07/13/2014  . Latex  03/26/2014    Past Medical History:  Diagnosis Date  . Anginal pain ([place])   . Anxiety   . Arthritis   . Chronic lower back pain   . COPD (chronic obstructive pulmonary disease) ([place])   . Depression   . Dyspepsia   . GERD (gastroesophageal reflux disease)   . H/O hiatal hernia   . Headache(784.0)   . History of blood transfusion 1963   w/childbirth  . History of bronchitis   . Hyperlipidemia   . Hypertension   . Shortness of breath 02/12/12   "all the time"  . Tremor  Past Surgical History:  Procedure Laterality Date  . BACK SURGERY    . CARDIAC CATHETERIZATION  04/19/1995   EF 65-70%  . CATARACT EXTRACTION W/ INTRAOCULAR LENS  IMPLANT, BILATERAL  2012  . CHOLECYSTECTOMY  2010  . LEFT HEART CATHETERIZATION WITH CORONARY ANGIOGRAM N/A 02/13/2012   Procedure: LEFT HEART CATHETERIZATION WITH CORONARY ANGIOGRAM;  Surgeon: Thayer Headings, MD;  Location: [facility] CATH LAB;  Service: Cardiovascular;  Laterality: N/A;  . LUMBAR DISC SURGERY  early 2000's  . TIBIA IM NAIL INSERTION Right 03/30/2014   Procedure: INTRAMEDULLARY (IM) NAIL TIBIAL;  Surgeon: Wylene Simmer, MD;  Location: [facility];  Service: Orthopedics;  Laterality:  Right;  . US ECHOCARDIOGRAPHY  12/17/2007   EF 55-60%  . VAGINAL HYSTERECTOMY  1980's    Family History  Problem Relation Age of Onset  . Heart attack Father     Social History   Social History  . Marital status: Married    Spouse name: Mannie  . Number of children: 2  . Years of education: GED   Occupational History  . Not on file.   Social History Main Topics  . Smoking status: Current Every Day Smoker    Packs/day: 1.00    Years: 50.00    Types: Cigarettes  . Smokeless tobacco: Never Used  . Alcohol use No  . Drug use: No  . Sexual activity: Yes   Other Topics Concern  . Not on file   Social History Narrative   Patient lives at home with her husband [facility]). Two children.   Retired.   Education GED.   Right handed.   Caffeine Four cups of coffee daily.   Review of systems: Review of Systems  Constitutional: Negative for fever and chills.  HENT: Negative.   Eyes: Negative for blurred vision.  Respiratory: as per HPI  Cardiovascular: Negative for chest pain and palpitations.  Gastrointestinal: Negative for vomiting, diarrhea, blood per rectum. Genitourinary: Negative for dysuria, urgency, frequency and hematuria.  Musculoskeletal: Negative for myalgias, back pain and joint pain.  Skin: Negative for itching and rash.  Neurological: Negative for dizziness, tremors, focal weakness, seizures and loss of consciousness.  Endo/Heme/Allergies: Negative for environmental allergies.  Psychiatric/Behavioral: Negative for depression, suicidal ideas and hallucinations.  All other systems reviewed and are negative.  Physical Exam: Blood pressure 116/74, pulse 78, height 5\' 9"  (1.753 m), weight 159 lb 9.6 oz (72.4 kg), SpO2 94 %. Gen:      No acute distress HEENT:  EOMI, sclera anicteric Neck:     No masses; no thyromegaly Lungs:    Clear to auscultation bilaterally; normal respiratory effort CV:         Regular rate and rhythm; no murmurs Abd:      + bowel sounds;  soft, non-tender; no palpable masses, no distension Ext:    No edema; adequate peripheral perfusion Skin:      Warm and dry; no rash Neuro: alert and oriented x 3 Psych: normal mood and affect  Data Reviewed: Chest x-ray 04/18/14-mild venous congestion, left midlung stable calcified granuloma. CT chest 05/21/09-no pulmonary medicine, emphysema, calcified left upper lobe granuloma CT chest 03/30/08-emphysema, calcified left upper lobe granuloma Chest x-ray 01/03/05-left upper lobe granuloma. All images personally reviewed.  Assessment:  #1 Consult for evaluation of dyspnea. Traci [name] likely has COPD based on her symptoms, smoking history and imaging in the past which shows emphysematous changes. She just on albuterol inhaler. I'll start her on a LABA/LAMA inhaler with with Utibron. She'll  continue her albuterol rescue inhaler and get scheduled for pulmonary function test  #2 Left upper lobe Granuloma Stable since 2006. Does Not Need Follow-Up.  #3 Active Smoker.  We Have discussed smoking cessation. She Is interested in quitting but feels she can do this on her own. Total Time Spent Counseling-5 Minutes. She'll Get Referred for Low-Dose Screening CT of the Chest.  Plan/Recommendations: - Start Utibron, continue albuterol rescue inhaler. - PFTs - Smoking cessation - Low dose screening CT  Marshell Garfinkel MD [facility] Pager [phone] 08/01/2016, 10:56 AM  CC: Raelyn Number, MD

## 2016-08-15 ENCOUNTER — Emergency Department (HOSPITAL_COMMUNITY): Payer: Medicare Other

## 2016-08-15 ENCOUNTER — Encounter (HOSPITAL_COMMUNITY): Payer: Self-pay

## 2016-08-15 ENCOUNTER — Inpatient Hospital Stay (HOSPITAL_COMMUNITY)
Admission: EM | Admit: 2016-08-15 | Discharge: 2016-08-25 | DRG: 189 | Disposition: A | Discharge status: Home Health | Payer: Medicare Other | Attending: Internal Medicine | Admitting: Internal Medicine

## 2016-08-15 DIAGNOSIS — I080 Rheumatic disorders of both mitral and aortic valves: Secondary | ICD-10-CM

## 2016-08-15 DIAGNOSIS — R0603 Acute respiratory distress: Secondary | ICD-10-CM

## 2016-08-15 DIAGNOSIS — Z961 Presence of intraocular lens: Secondary | ICD-10-CM | POA: Diagnosis present

## 2016-08-15 DIAGNOSIS — K219 Gastro-esophageal reflux disease without esophagitis: Secondary | ICD-10-CM | POA: Diagnosis present

## 2016-08-15 DIAGNOSIS — I10 Essential (primary) hypertension: Secondary | ICD-10-CM | POA: Diagnosis present

## 2016-08-15 DIAGNOSIS — E46 Unspecified protein-calorie malnutrition: Secondary | ICD-10-CM | POA: Diagnosis present

## 2016-08-15 DIAGNOSIS — I252 Old myocardial infarction: Secondary | ICD-10-CM

## 2016-08-15 DIAGNOSIS — D649 Anemia, unspecified: Secondary | ICD-10-CM | POA: Diagnosis present

## 2016-08-15 DIAGNOSIS — J209 Acute bronchitis, unspecified: Secondary | ICD-10-CM | POA: Diagnosis present

## 2016-08-15 DIAGNOSIS — Z79899 Other long term (current) drug therapy: Secondary | ICD-10-CM

## 2016-08-15 DIAGNOSIS — I5033 Acute on chronic diastolic (congestive) heart failure: Secondary | ICD-10-CM

## 2016-08-15 DIAGNOSIS — R079 Chest pain, unspecified: Secondary | ICD-10-CM | POA: Diagnosis present

## 2016-08-15 DIAGNOSIS — G8929 Other chronic pain: Secondary | ICD-10-CM | POA: Diagnosis present

## 2016-08-15 DIAGNOSIS — J449 Chronic obstructive pulmonary disease, unspecified: Secondary | ICD-10-CM | POA: Diagnosis present

## 2016-08-15 DIAGNOSIS — J208 Acute bronchitis due to other specified organisms: Secondary | ICD-10-CM | POA: Diagnosis present

## 2016-08-15 DIAGNOSIS — I5031 Acute diastolic (congestive) heart failure: Secondary | ICD-10-CM | POA: Diagnosis present

## 2016-08-15 DIAGNOSIS — Z8249 Family history of ischemic heart disease and other diseases of the circulatory system: Secondary | ICD-10-CM

## 2016-08-15 DIAGNOSIS — M545 Low back pain: Secondary | ICD-10-CM | POA: Diagnosis present

## 2016-08-15 DIAGNOSIS — F17210 Nicotine dependence, cigarettes, uncomplicated: Secondary | ICD-10-CM | POA: Diagnosis present

## 2016-08-15 DIAGNOSIS — D509 Iron deficiency anemia, unspecified: Secondary | ICD-10-CM | POA: Diagnosis present

## 2016-08-15 DIAGNOSIS — G250 Essential tremor: Secondary | ICD-10-CM | POA: Diagnosis present

## 2016-08-15 DIAGNOSIS — I509 Heart failure, unspecified: Secondary | ICD-10-CM

## 2016-08-15 DIAGNOSIS — Z888 Allergy status to other drugs, medicaments and biological substances status: Secondary | ICD-10-CM

## 2016-08-15 DIAGNOSIS — E538 Deficiency of other specified B group vitamins: Secondary | ICD-10-CM | POA: Diagnosis present

## 2016-08-15 DIAGNOSIS — J440 Chronic obstructive pulmonary disease with acute lower respiratory infection: Secondary | ICD-10-CM | POA: Diagnosis present

## 2016-08-15 DIAGNOSIS — Z9842 Cataract extraction status, left eye: Secondary | ICD-10-CM

## 2016-08-15 DIAGNOSIS — M549 Dorsalgia, unspecified: Secondary | ICD-10-CM

## 2016-08-15 DIAGNOSIS — Z993 Dependence on wheelchair: Secondary | ICD-10-CM

## 2016-08-15 DIAGNOSIS — E785 Hyperlipidemia, unspecified: Secondary | ICD-10-CM | POA: Diagnosis present

## 2016-08-15 DIAGNOSIS — R402412 Glasgow coma scale score 13-15, at arrival to emergency department: Secondary | ICD-10-CM | POA: Diagnosis present

## 2016-08-15 DIAGNOSIS — F419 Anxiety disorder, unspecified: Secondary | ICD-10-CM | POA: Diagnosis present

## 2016-08-15 DIAGNOSIS — Z9841 Cataract extraction status, right eye: Secondary | ICD-10-CM

## 2016-08-15 DIAGNOSIS — J9601 Acute respiratory failure with hypoxia: Principal | ICD-10-CM | POA: Diagnosis present

## 2016-08-15 DIAGNOSIS — Z4659 Encounter for fitting and adjustment of other gastrointestinal appliance and device: Secondary | ICD-10-CM

## 2016-08-15 DIAGNOSIS — E039 Hypothyroidism, unspecified: Secondary | ICD-10-CM | POA: Diagnosis present

## 2016-08-15 DIAGNOSIS — I110 Hypertensive heart disease with heart failure: Secondary | ICD-10-CM | POA: Diagnosis present

## 2016-08-15 DIAGNOSIS — Z91040 Latex allergy status: Secondary | ICD-10-CM

## 2016-08-15 DIAGNOSIS — I50810 Right heart failure, unspecified: Secondary | ICD-10-CM | POA: Diagnosis present

## 2016-08-15 HISTORY — DX: Hypothyroidism, unspecified: E03.9

## 2016-08-15 HISTORY — DX: Unspecified chronic bronchitis: J42

## 2016-08-15 LAB — BASIC METABOLIC PANEL
ANION GAP: 9 (ref 5–15)
BUN: 6 mg/dL (ref 6–20)
CALCIUM: 9 mg/dL (ref 8.9–10.3)
CO2: 26 mmol/L (ref 22–32)
Chloride: 103 mmol/L (ref 101–111)
Creatinine, Ser: 0.92 mg/dL (ref 0.44–1.00)
GFR calc Af Amer: >60 mL/min (ref >60)
GFR calc non Af Amer: >60 mL/min (ref >60)
Glucose, Bld: 87 mg/dL (ref 65–99)
Potassium: 4.1 mmol/L (ref 3.5–5.1)
SODIUM: 138 mmol/L (ref 135–145)

## 2016-08-15 LAB — CBC
HCT: 33.3 % — ABNORMAL LOW (ref 36.0–46.0)
HEMOGLOBIN: 10.3 g/dL — AB (ref 12.0–15.0)
MCH: 27.7 pg (ref 26.0–34.0)
MCHC: 30.9 g/dL (ref 30.0–36.0)
MCV: 89.5 fL (ref 78.0–100.0)
PLATELETS: 245 10*3/uL (ref 150–400)
RBC: 3.72 MIL/uL — AB (ref 3.87–5.11)
RDW: 14.6 % (ref 11.5–15.5)
WBC: 6.1 10*3/uL (ref 4.0–10.5)

## 2016-08-15 LAB — I-STAT TROPONIN, ED: TROPONIN I, POC: 0 ng/mL (ref 0.00–0.08)

## 2016-08-15 MED ORDER — ALBUTEROL SULFATE (2.5 MG/3ML) 0.083% IN NEBU
INHALATION_SOLUTION | RESPIRATORY_TRACT | Status: AC
  Filled 2016-08-15: qty 6

## 2016-08-15 MED ORDER — METHYLPREDNISOLONE SODIUM SUCC 125 MG IJ SOLR
125.0000 mg | Freq: Once | INTRAMUSCULAR | Status: AC
  Administered 2016-08-15: 125 mg via INTRAVENOUS
  Filled 2016-08-15: qty 2

## 2016-08-15 MED ORDER — ALBUTEROL SULFATE (2.5 MG/3ML) 0.083% IN NEBU
5.0000 mg | INHALATION_SOLUTION | Freq: Once | RESPIRATORY_TRACT | Status: AC
  Administered 2016-08-15: 5 mg via RESPIRATORY_TRACT

## 2016-08-15 NOTE — ED Triage Notes (Signed)
Pt states that SOB has been going on for several days, with CP, worse today. Pain in central and radiates to R arm, denies n/v, speaking in short sentences, labored breathing.

## 2016-08-15 NOTE — ED Provider Notes (Addendum)
[facility] DEPT Provider Note   CSN: [id number] Arrival date & time: 08/15/16  2235  By signing my name below, I, Jeanell Sparrow, attest that this documentation has been prepared under the direction and in the presence of Orpah Greek, MD. Electronically Signed: Jeanell Sparrow, Scribe. 08/15/2016. 11:26 PM.  History   Chief Complaint Chief Complaint  Patient presents with  . Chest Pain  . Shortness of Breath   The history is provided by the patient and a relative. No language interpreter was used.   HPI Comments: Traci [name] is a 70 y.o. female with a PMHx of COPD who presents to the Emergency Department complaining of intermittent moderate SOB that started a few days ago. She usually takes her albuterol inhaler frequently at baseline, but today it did not provide relief for her worsening SOB. She was given a nebulizer and oxygen treatment after ED arrival with some relief. She reports associated symptoms of productive cough, wheezing, subjective fever, and right-sided chest pain. Pt's temperature in the ED today was 99.2. She admits to having tremors at baseline. She denies any oxygen therapy at home, leg swelling, or other complaints.     PCP: Bonnita Nasuti, MD  Past Medical History:  Diagnosis Date  . Anginal pain ([place])   . Anxiety   . Arthritis   . Chronic lower back pain   . COPD (chronic obstructive pulmonary disease) ([place])   . Depression   . Dyspepsia   . GERD (gastroesophageal reflux disease)   . H/O hiatal hernia   . Headache(784.0)   . History of blood transfusion 1963   w/childbirth  . History of bronchitis   . Hyperlipidemia   . Hypertension   . Shortness of breath 02/12/12   "all the time"  . Tremor     Patient Active Problem List   Diagnosis Date Noted  . Acute respiratory failure with hypoxia ([place]) 04/01/2014  . Sinus tachycardia 03/31/2014  . Fractured tibia and fibula 03/26/2014  . Right tibial fracture 03/26/2014  . Closed right  tibial fracture 03/26/2014  . Unstable gait 03/26/2014  . Chest pain syndrome 02/12/2012  . Heart palpitations 09/24/2011  . Spinal stenosis 09/24/2011  . HEARTBURN 08/03/2010  . BENIGN NEOPLASM OF ADRENAL GLAND 07/28/2010  . ANXIETY 07/28/2010  . MYOCARDIAL INFARCTION, HX OF 07/28/2010  . COPD 07/28/2010  . DIVERTICULOSIS, COLON 07/28/2010  . COLONIC POLYPS, ADENOMATOUS, HX OF 07/28/2010  . HYPERLIPIDEMIA 06/23/2010  . HYPERTENSION 06/23/2010  . ACUTE DILATATION OF STOMACH 06/23/2010  . FLATULENCE-GAS-BLOATING 06/23/2010  . DIARRHEA 06/23/2010  . CHANGE IN BOWELS 06/23/2010  . ABDOMINAL PAIN, GENERALIZED 06/23/2010    Past Surgical History:  Procedure Laterality Date  . BACK SURGERY    . CARDIAC CATHETERIZATION  04/19/1995   EF 65-70%  . CATARACT EXTRACTION W/ INTRAOCULAR LENS  IMPLANT, BILATERAL  2012  . CHOLECYSTECTOMY  2010  . LEFT HEART CATHETERIZATION WITH CORONARY ANGIOGRAM N/A 02/13/2012   Procedure: LEFT HEART CATHETERIZATION WITH CORONARY ANGIOGRAM;  Surgeon: Thayer Headings, MD;  Location: [facility] C[facility] LAB;  Service: Cardiovascular;  Laterality: N/A;  . LUMBAR DISC SURGERY  early 2000's  . TIBIA IM NAIL INSERTION Right 03/30/2014   Procedure: INTRAMEDULLARY (IM) NAIL TIBIAL;  Surgeon: Wylene Simmer, MD;  Location: [facility];  Service: Orthopedics;  Laterality: Right;  . US ECHOCARDIOGRAPHY  12/17/2007   EF 55-60%  . VAGINAL HYSTERECTOMY  1980's    OB History    No data available  Home Medications    Prior to Admission medications   Medication Sig Start Date End Date Taking? Authorizing Provider  albuterol (PROVENTIL HFA;VENTOLIN HFA) 108 (90 BASE) MCG/ACT inhaler Inhale 2 puffs into the lungs every 6 (six) hours as needed for wheezing or shortness of breath.   Yes Historical Provider, MD  ALPRAZolam Duanne Moron) 1 MG tablet Take 1 mg by mouth 2 (two) times daily as needed for anxiety.  02/10/14  Yes Historical Provider, MD  buPROPion (WELLBUTRIN XL) 150 MG 24 hr tablet  Take 450 mg by mouth daily.    Yes Historical Provider, MD  busPIRone (BUSPAR) 10 MG tablet Take 10 mg by mouth 2 (two) times daily.    Yes Historical Provider, MD  Cholecalciferol (VITAMIN D3) 2000 units TABS Take 2,000 Units by mouth daily.   Yes Historical Provider, MD  citalopram (CELEXA) 20 MG tablet Take 20 mg by mouth daily.   Yes Historical Provider, MD  Indacaterol-Glycopyrrolate (UTIBRON NEOHALER) 27.5-15.6 MCG CAPS Place 1 capsule into inhaler and inhale 2 (two) times daily. 08/01/16  Yes Praveen Mannam, MD  levothyroxine (SYNTHROID, LEVOTHROID) 25 MCG tablet Take 25 mcg by mouth daily before breakfast.  08/31/14  Yes Historical Provider, MD  omeprazole (PRILOSEC) 20 MG capsule Take 20 mg by mouth daily.   Yes Historical Provider, MD  oxyCODONE (ROXICODONE) 15 MG immediate release tablet Take 15 mg by mouth every 6 (six) hours as needed for pain.   Yes Historical Provider, MD  primidone (MYSOLINE) 50 MG tablet Take 2 tablets (100 mg total) by mouth 3 (three) times daily. Patient taking differently: Take 50 mg by mouth 4 (four) times daily.  09/16/14  Yes Marcial Pacas, MD  traZODone (DESYREL) 150 MG tablet Take 300 mg by mouth at bedtime.  08/31/14  Yes Historical Provider, MD  Turmeric 500 MG TABS Take 500 mg by mouth daily.   Yes Historical Provider, MD  vitamin B-12 (CYANOCOBALAMIN) 1000 MCG tablet Take 1,000 mcg by mouth daily.   Yes Historical Provider, MD  Indacaterol-Glycopyrrolate (UTIBRON NEOHALER) 27.5-15.6 MCG CAPS Place 1 capsule into inhaler and inhale 2 (two) times daily. Patient not taking: Reported on 08/16/2016 08/01/16   Marshell Garfinkel, MD  vitamin B-12 1000 MCG tablet Take 1 tablet (1,000 mcg total) by mouth daily. Patient not taking: Reported on 08/16/2016 04/05/14   Wylene Simmer, MD    Family History Family History  Problem Relation Age of Onset  . Heart attack Father     Social History Social History  Substance Use Topics  . Smoking status: Current Every Day Smoker     Packs/day: 1.00    Years: 50.00    Types: Cigarettes  . Smokeless tobacco: Never Used  . Alcohol use No     Allergies   Cephalexin; Cymbalta [duloxetine hcl]; and Latex   Review of Systems Review of Systems  Constitutional: Positive for fever (Subjective).  Respiratory: Positive for cough (Productive), shortness of breath and wheezing.   Cardiovascular: Positive for chest pain (Right-sided). Negative for leg swelling.  All other systems reviewed and are negative.    Physical Exam Updated Vital Signs BP 157/73 (BP Location: Right Arm)   Pulse 94   Temp 99.2 F (37.3 C) (Oral)   Resp 22   SpO2 94%   Physical Exam  Constitutional: She is oriented to person, place, and time. She appears well-developed and well-nourished. No distress.  HENT:  Head: Normocephalic and atraumatic.  Right Ear: Hearing normal.  Left Ear: Hearing normal.  Nose:  Nose normal.  Mouth/Throat: Oropharynx is clear and moist and mucous membranes are normal.  Eyes: Conjunctivae and EOM are normal. Pupils are equal, round, and reactive to light.  Neck: Normal range of motion. Neck supple.  Cardiovascular: Regular rhythm, S1 normal and S2 normal.  Exam reveals no gallop and no friction rub.   No murmur heard. Tachycardia.   Pulmonary/Chest: No respiratory distress. She exhibits no tenderness.  Tachynpea. Increased work of breathing. Diminished bilaterally. Rales at the right base.    Abdominal: Soft. Normal appearance and bowel sounds are normal. There is no hepatosplenomegaly. There is no tenderness. There is no rebound, no guarding, no tenderness at McBurney's point and negative Murphy's sign. No hernia.  Musculoskeletal: Normal range of motion.  Neurological: She is alert and oriented to person, place, and time. She has normal strength. No cranial nerve deficit or sensory deficit. Coordination normal. GCS eye subscore is 4. GCS verbal subscore is 5. GCS motor subscore is 6.  Skin: Skin is warm, dry  and intact. No rash noted. No cyanosis.  Psychiatric: She has a normal mood and affect. Her speech is normal and behavior is normal. Thought content normal.  Nursing note and vitals reviewed.    ED Treatments / Results  DIAGNOSTIC STUDIES: Oxygen Saturation is 94% on RA, normal by my interpretation.    COORDINATION OF CARE: 11:30 PM- Pt advised of plan for treatment and pt agrees.  Labs (all labs ordered are listed, but only abnormal results are displayed) Labs Reviewed  CBC - Abnormal; Notable for the following:       Result Value   RBC 3.72 (*)    Hemoglobin 10.3 (*)    HCT 33.3 (*)    All other components within normal limits  BRAIN NATRIURETIC PEPTIDE - Abnormal; Notable for the following:    B Natriuretic Peptide 251.2 (*)    All other components within normal limits  BASIC METABOLIC PANEL  I-STAT TROPOININ, ED    EKG  EKG Interpretation  Date/Time:  Wednesday August 15 2016 22:40:05 EST Ventricular Rate:  110 PR Interval:  158 QRS Duration: 80 QT Interval:  346 QTC Calculation: 468 R Axis:   71 Text Interpretation:  Undetermined rhythm Nonspecific ST and T wave abnormality - borderline ST depression lateral leads, new since prior tracing Abnormal ECG Confirmed by Betsey Holiday  MD, Hannan Tetzlaff [phone]) on 08/15/2016 11:22:46 PM       Radiology Dg Chest 2 View  Result Date: 08/15/2016 CLINICAL DATA:  Subacute onset of shortness of breath and chronic cough. Initial encounter. EXAM: CHEST  2 VIEW COMPARISON:  Chest radiograph performed 08/01/2016 FINDINGS: Small bilateral pleural effusions are seen, left greater than right. Vascular congestion is seen. Increased interstitial markings may reflect mild interstitial edema. The lungs are well-aerated. There is no evidence of pneumothorax. The heart is normal in size; the mediastinal contour is within normal limits. No acute osseous abnormalities are seen. IMPRESSION: Small bilateral pleural effusions, left greater than  right. Vascular congestion noted. Increased interstitial markings may reflect mild interstitial edema. Electronically Signed   By: Garald Balding M.D.   On: 08/15/2016 23:20   Ct Angio Chest Pe W Or Wo Contrast  Result Date: 08/16/2016 CLINICAL DATA:  Chest pain and shortness of breath. EXAM: CT ANGIOGRAPHY CHEST WITH CONTRAST TECHNIQUE: Multidetector CT imaging of the chest was performed using the standard protocol during bolus administration of intravenous contrast. Multiplanar CT image reconstructions and MIPs were obtained to evaluate the vascular anatomy. CONTRAST:  100  cc Isovue 370 IV COMPARISON:  Radiographs yesterday and 08/01/2016 FINDINGS: Cardiovascular: There are no filling defects within the pulmonary arteries to suggest pulmonary embolus. Atherosclerosis of the thoracic aorta without aneurysm or evidence of dissection. Minimal contrast refluxing into the hepatic veins and IVC. There is jugular venous distention. No pericardial fluid. Scattered coronary artery calcifications. Mediastinum/Nodes: No mediastinal, hilar, or axillary adenopathy. The esophagus is decompressed. Questionable debris within the dependent trachea, motion artifact limits assessment. Lungs/Pleura: Moderate bilateral pleural effusions. Moderate emphysema. Peripheral opacity just distal to the small blood in the right upper lobe may be chronic scarring or focal airspace pneumonia. There is mild smooth septal thickening. Calcified granuloma in the left upper lobe. Compressive atelectasis adjacent to pleural effusions. Relatively severe bronchial thickening is most prominent in the lower lobes. Fluid distends the azygos fissure. Upper Abdomen: No acute abnormality. Musculoskeletal: There are no acute or suspicious osseous abnormalities. Scattered calcified intervertebral discs. Review of the MIP images confirms the above findings. IMPRESSION: 1. No pulmonary embolus. 2. Findings consistent with right heart/congestive heart  failure. Moderate pleural effusions, mild pulmonary edema, jugular venous distention and contrast refluxing into the hepatic veins and IVC. 3. Emphysema. Focal opacity just distal to a small blood in the right upper lobe may be chronic scarring or pneumonia. 4. Bronchial thickening, advanced, is most prominent in the lower lobes, may be acute or chronic. 5. Thoracic aortic atherosclerosis.  Coronary artery calcifications. Electronically Signed   By: Jeb Levering M.D.   On: 08/16/2016 04:31    Procedures Procedures (including critical care time)  Medications Ordered in ED Medications  furosemide (LASIX) injection 40 mg (not administered)  ipratropium-albuterol (DUONEB) 0.5-2.5 (3) MG/3ML nebulizer solution 3 mL (not administered)  nitroGLYCERIN (NITROGLYN) 2 % ointment 1 inch (not administered)  albuterol (PROVENTIL) (2.5 MG/3ML) 0.083% nebulizer solution 5 mg (5 mg Nebulization Given 08/15/16 2244)  methylPREDNISolone sodium succinate (SOLU-MEDROL) 125 mg/2 mL injection 125 mg (125 mg Intravenous Given 08/15/16 2352)  iopamidol (ISOVUE-370) 76 % injection (100 mLs  Contrast Given 08/16/16 0410)     Initial Impression / Assessment and Plan / ED Course  I have reviewed the triage vital signs and the nursing notes.  Pertinent labs & imaging results that were available during my care of the patient were reviewed by me and considered in my medical decision making (see chart for details).  Clinical Course    Patient presents to the emergency department for evaluation of shortness of breath. Patient reports that she has been experiencing progressively worsening shortness of breath over a period of 1 week. She has now noticed that she is experiencing some pains in her chest and shortness of breath worsens with any exertion. She does not have a history of heart disease. She does have some chronic lung disease, uses albuterol at home. She has not had any relief with the albuterol. Patient was given  a nebulizer treatment in triage and seemed to significantly improve, however, over time her breathing has worsened. She has become tachypneic again, but not hypoxic. She is, however, on oxygen here which he does not use at home.  EKG did have some subtle ST depressions laterally, cannot rule out ischemia, though not clear ischemic changes. Troponin negative. Chest x-ray suggested some element of heart failure, no pneumonia. CT scan performed to rule out PE. No evidence of pulmonary embolism seen, however, there is significant evidence of congestive heart failure. This would be a new diagnosis for the patient. She was initiated on Nitropaste, Lasix,  and given additional nebulization therapy.   Patient feels some improvement after DuoNeb, however, is still tachypneic. Oxygen saturation is close to 100%, however. Patient will require initial admission to the stepdown unit for close monitoring.  CRITICAL CARE Performed by: Orpah Greek   Total critical care time: 30 minutes  Critical care time was exclusive of separately billable procedures and treating other patients.  Critical care was necessary to treat or prevent imminent or life-threatening deterioration.  Critical care was time spent personally by me on the following activities: development of treatment plan with patient and/or surrogate as well as nursing, discussions with consultants, evaluation of patient's response to treatment, examination of patient, obtaining history from patient or surrogate, ordering and performing treatments and interventions, ordering and review of laboratory studies, ordering and review of radiographic studies, pulse oximetry and re-evaluation of patient's condition.  MDM Number of Diagnoses or Management Options Acute congestive heart failure, unspecified congestive heart failure type [facility]):    Amount and/or Complexity of Data Reviewed Clinical lab tests: ordered and reviewed Tests in the radiology  section of CPT: ordered and reviewed Tests in the medicine section of CPT: ordered and reviewed Obtain history from someone other than the patient: yes Review and summarize past medical records: yes Discuss the patient with other providers: yes Independent visualization of images, tracings, or specimens: yes  Risk of Complications, Morbidity, and/or Mortality Presenting problems: high Diagnostic procedures: high Management options: high  Critical Care Total time providing critical care: 30-74 minutes  Patient Progress Patient progress: stable    Final Clinical Impressions(s) / ED Diagnoses   Final diagnoses:  Acute congestive heart failure, unspecified congestive heart failure type ([place])    New Prescriptions New Prescriptions   No medications on file   I personally performed the services described in this documentation, which was scribed in my presence. The recorded information has been reviewed and is accurate.     Orpah Greek, MD 08/16/16 [place], MD 08/16/16 [phone]

## 2016-08-15 NOTE — ED Notes (Addendum)
Pt states she has been SOB of breath x1 week. Pt denies any swelling and fever. Pt does report CP and SOB upon talking and exertion.   Pt placed on 3 liters of oxygen by cannula.

## 2016-08-16 ENCOUNTER — Emergency Department (HOSPITAL_COMMUNITY): Payer: Medicare Other

## 2016-08-16 ENCOUNTER — Encounter (HOSPITAL_COMMUNITY): Payer: Self-pay | Admitting: General Practice

## 2016-08-16 DIAGNOSIS — J209 Acute bronchitis, unspecified: Secondary | ICD-10-CM | POA: Diagnosis present

## 2016-08-16 DIAGNOSIS — G8929 Other chronic pain: Secondary | ICD-10-CM | POA: Diagnosis present

## 2016-08-16 DIAGNOSIS — J9601 Acute respiratory failure with hypoxia: Secondary | ICD-10-CM | POA: Diagnosis present

## 2016-08-16 DIAGNOSIS — R402412 Glasgow coma scale score 13-15, at arrival to emergency department: Secondary | ICD-10-CM | POA: Diagnosis present

## 2016-08-16 DIAGNOSIS — M549 Dorsalgia, unspecified: Secondary | ICD-10-CM

## 2016-08-16 DIAGNOSIS — I50810 Right heart failure, unspecified: Secondary | ICD-10-CM | POA: Diagnosis present

## 2016-08-16 DIAGNOSIS — R079 Chest pain, unspecified: Secondary | ICD-10-CM | POA: Diagnosis present

## 2016-08-16 DIAGNOSIS — G250 Essential tremor: Secondary | ICD-10-CM | POA: Diagnosis present

## 2016-08-16 DIAGNOSIS — I10 Essential (primary) hypertension: Secondary | ICD-10-CM | POA: Diagnosis not present

## 2016-08-16 DIAGNOSIS — E039 Hypothyroidism, unspecified: Secondary | ICD-10-CM | POA: Diagnosis present

## 2016-08-16 DIAGNOSIS — J208 Acute bronchitis due to other specified organisms: Secondary | ICD-10-CM | POA: Diagnosis present

## 2016-08-16 DIAGNOSIS — Z9842 Cataract extraction status, left eye: Secondary | ICD-10-CM | POA: Diagnosis not present

## 2016-08-16 DIAGNOSIS — F17210 Nicotine dependence, cigarettes, uncomplicated: Secondary | ICD-10-CM | POA: Diagnosis present

## 2016-08-16 DIAGNOSIS — I509 Heart failure, unspecified: Secondary | ICD-10-CM

## 2016-08-16 DIAGNOSIS — R0789 Other chest pain: Secondary | ICD-10-CM | POA: Diagnosis not present

## 2016-08-16 DIAGNOSIS — E538 Deficiency of other specified B group vitamins: Secondary | ICD-10-CM | POA: Diagnosis present

## 2016-08-16 DIAGNOSIS — K219 Gastro-esophageal reflux disease without esophagitis: Secondary | ICD-10-CM | POA: Diagnosis present

## 2016-08-16 DIAGNOSIS — F064 Anxiety disorder due to known physiological condition: Secondary | ICD-10-CM | POA: Diagnosis not present

## 2016-08-16 DIAGNOSIS — I110 Hypertensive heart disease with heart failure: Secondary | ICD-10-CM | POA: Diagnosis present

## 2016-08-16 DIAGNOSIS — I5031 Acute diastolic (congestive) heart failure: Secondary | ICD-10-CM | POA: Diagnosis present

## 2016-08-16 DIAGNOSIS — D649 Anemia, unspecified: Secondary | ICD-10-CM | POA: Diagnosis present

## 2016-08-16 DIAGNOSIS — E46 Unspecified protein-calorie malnutrition: Secondary | ICD-10-CM | POA: Diagnosis present

## 2016-08-16 DIAGNOSIS — I252 Old myocardial infarction: Secondary | ICD-10-CM | POA: Diagnosis not present

## 2016-08-16 DIAGNOSIS — D508 Other iron deficiency anemias: Secondary | ICD-10-CM | POA: Diagnosis not present

## 2016-08-16 DIAGNOSIS — F419 Anxiety disorder, unspecified: Secondary | ICD-10-CM | POA: Diagnosis present

## 2016-08-16 DIAGNOSIS — Z961 Presence of intraocular lens: Secondary | ICD-10-CM | POA: Diagnosis present

## 2016-08-16 DIAGNOSIS — I050 Rheumatic mitral stenosis: Secondary | ICD-10-CM | POA: Diagnosis not present

## 2016-08-16 DIAGNOSIS — J440 Chronic obstructive pulmonary disease with acute lower respiratory infection: Secondary | ICD-10-CM | POA: Diagnosis present

## 2016-08-16 DIAGNOSIS — I351 Nonrheumatic aortic (valve) insufficiency: Secondary | ICD-10-CM | POA: Diagnosis not present

## 2016-08-16 DIAGNOSIS — Z993 Dependence on wheelchair: Secondary | ICD-10-CM | POA: Diagnosis not present

## 2016-08-16 DIAGNOSIS — D509 Iron deficiency anemia, unspecified: Secondary | ICD-10-CM | POA: Diagnosis present

## 2016-08-16 DIAGNOSIS — E785 Hyperlipidemia, unspecified: Secondary | ICD-10-CM | POA: Diagnosis present

## 2016-08-16 DIAGNOSIS — I340 Nonrheumatic mitral (valve) insufficiency: Secondary | ICD-10-CM | POA: Diagnosis not present

## 2016-08-16 DIAGNOSIS — M545 Low back pain: Secondary | ICD-10-CM | POA: Diagnosis present

## 2016-08-16 DIAGNOSIS — I080 Rheumatic disorders of both mitral and aortic valves: Secondary | ICD-10-CM | POA: Diagnosis present

## 2016-08-16 LAB — TROPONIN I
Troponin I: <0.03 ng/mL (ref <0.03)
Troponin I: <0.03 ng/mL (ref <0.03)

## 2016-08-16 LAB — FOLATE: FOLATE: 3.6 ng/mL — AB (ref >5.9)

## 2016-08-16 LAB — RESPIRATORY PANEL BY PCR

## 2016-08-16 LAB — TSH: TSH: 0.662 u[IU]/mL (ref 0.350–4.500)

## 2016-08-16 LAB — IRON AND TIBC
Iron: 35 ug/dL (ref 28–170)
SATURATION RATIOS: 9 % — AB (ref 10.4–31.8)
TIBC: 402 ug/dL (ref 250–450)
UIBC: 367 ug/dL

## 2016-08-16 LAB — BRAIN NATRIURETIC PEPTIDE: B NATRIURETIC PEPTIDE 5: 251.2 pg/mL — AB (ref 0.0–100.0)

## 2016-08-16 LAB — RETICULOCYTES
RBC.: 3.42 MIL/uL — AB (ref 3.87–5.11)
Retic Count, Absolute: 78.7 10*3/uL (ref 19.0–186.0)
Retic Ct Pct: 2.3 % (ref 0.4–3.1)

## 2016-08-16 LAB — SEDIMENTATION RATE: Sed Rate: 35 mm/hr — ABNORMAL HIGH (ref 0–22)

## 2016-08-16 LAB — PROCALCITONIN: Procalcitonin: <0.1 ng/mL

## 2016-08-16 LAB — VITAMIN B12: VITAMIN B 12: 2579 pg/mL — AB (ref 180–914)

## 2016-08-16 LAB — FERRITIN: Ferritin: 54 ng/mL (ref 11–307)

## 2016-08-16 MED ORDER — PRIMIDONE 50 MG PO TABS
50.0000 mg | ORAL_TABLET | Freq: Four times a day (QID) | ORAL | Status: DC
  Administered 2016-08-16 – 2016-08-25 (×39): 50 mg via ORAL
  Filled 2016-08-16 (×42): qty 1

## 2016-08-16 MED ORDER — SODIUM CHLORIDE 0.9% FLUSH: 3.0000 mL | INTRAVENOUS | Status: DC | PRN

## 2016-08-16 MED ORDER — BUSPIRONE HCL 5 MG PO TABS
10.0000 mg | ORAL_TABLET | Freq: Two times a day (BID) | ORAL | Status: DC
  Administered 2016-08-16 – 2016-08-25 (×19): 10 mg via ORAL
  Filled 2016-08-16 (×13): qty 2
  Filled 2016-08-16: qty 1
  Filled 2016-08-16 (×5): qty 2

## 2016-08-16 MED ORDER — FUROSEMIDE 10 MG/ML IJ SOLN
40.0000 mg | Freq: Two times a day (BID) | INTRAMUSCULAR | Status: DC
  Administered 2016-08-16 – 2016-08-20 (×8): 40 mg via INTRAVENOUS
  Filled 2016-08-16 (×8): qty 4

## 2016-08-16 MED ORDER — ACETAMINOPHEN 325 MG PO TABS
650.0000 mg | ORAL_TABLET | Freq: Once | ORAL | Status: AC
  Administered 2016-08-16: 650 mg via ORAL
  Filled 2016-08-16: qty 2

## 2016-08-16 MED ORDER — ACETAMINOPHEN 325 MG PO TABS
650.0000 mg | ORAL_TABLET | ORAL | Status: DC | PRN
  Administered 2016-08-16 – 2016-08-19 (×3): 650 mg via ORAL
  Filled 2016-08-16 (×4): qty 2

## 2016-08-16 MED ORDER — INDACATEROL-GLYCOPYRROLATE 27.5-15.6 MCG IN CAPS: 1.0000 | ORAL_CAPSULE | Freq: Two times a day (BID) | RESPIRATORY_TRACT | Status: DC

## 2016-08-16 MED ORDER — IPRATROPIUM-ALBUTEROL 0.5-2.5 (3) MG/3ML IN SOLN
3.0000 mL | Freq: Once | RESPIRATORY_TRACT | Status: AC
  Administered 2016-08-16: 3 mL via RESPIRATORY_TRACT
  Filled 2016-08-16: qty 3

## 2016-08-16 MED ORDER — ONDANSETRON HCL 4 MG/2ML IJ SOLN: 4.0000 mg | Freq: Three times a day (TID) | INTRAMUSCULAR | Status: AC | PRN

## 2016-08-16 MED ORDER — ENOXAPARIN SODIUM 40 MG/0.4ML ~~LOC~~ SOLN
40.0000 mg | SUBCUTANEOUS | Status: DC
  Administered 2016-08-17 – 2016-08-25 (×9): 40 mg via SUBCUTANEOUS
  Filled 2016-08-16 (×9): qty 0.4

## 2016-08-16 MED ORDER — FUROSEMIDE 10 MG/ML IJ SOLN
40.0000 mg | Freq: Once | INTRAMUSCULAR | Status: AC
  Administered 2016-08-16: 40 mg via INTRAVENOUS
  Filled 2016-08-16: qty 4

## 2016-08-16 MED ORDER — METHYLPREDNISOLONE SODIUM SUCC 125 MG IJ SOLR
60.0000 mg | Freq: Four times a day (QID) | INTRAMUSCULAR | Status: DC
  Administered 2016-08-16 – 2016-08-17 (×4): 60 mg via INTRAVENOUS
  Filled 2016-08-16 (×3): qty 2

## 2016-08-16 MED ORDER — LEVOTHYROXINE SODIUM 25 MCG PO TABS
25.0000 ug | ORAL_TABLET | Freq: Every day | ORAL | Status: DC
  Administered 2016-08-16 – 2016-08-25 (×10): 25 ug via ORAL
  Filled 2016-08-16 (×10): qty 1

## 2016-08-16 MED ORDER — CITALOPRAM HYDROBROMIDE 20 MG PO TABS
20.0000 mg | ORAL_TABLET | Freq: Every day | ORAL | Status: DC
  Administered 2016-08-16 – 2016-08-25 (×10): 20 mg via ORAL
  Filled 2016-08-16 (×3): qty 1
  Filled 2016-08-16: qty 2
  Filled 2016-08-16 (×6): qty 1

## 2016-08-16 MED ORDER — PANTOPRAZOLE SODIUM 40 MG PO TBEC
40.0000 mg | DELAYED_RELEASE_TABLET | Freq: Every day | ORAL | Status: DC
  Administered 2016-08-16 – 2016-08-25 (×10): 40 mg via ORAL
  Filled 2016-08-16 (×10): qty 1

## 2016-08-16 MED ORDER — VITAMIN D 1000 UNITS PO TABS
2000.0000 [IU] | ORAL_TABLET | Freq: Every day | ORAL | Status: DC
  Administered 2016-08-16 – 2016-08-25 (×9): 2000 [IU] via ORAL
  Filled 2016-08-16 (×9): qty 2

## 2016-08-16 MED ORDER — SODIUM CHLORIDE 0.9% FLUSH
3.0000 mL | Freq: Two times a day (BID) | INTRAVENOUS | Status: DC
  Administered 2016-08-16 – 2016-08-25 (×18): 3 mL via INTRAVENOUS

## 2016-08-16 MED ORDER — IOPAMIDOL (ISOVUE-370) INJECTION 76%
INTRAVENOUS | Status: AC
  Administered 2016-08-16: 100 mL
  Filled 2016-08-16: qty 100

## 2016-08-16 MED ORDER — IPRATROPIUM-ALBUTEROL 0.5-2.5 (3) MG/3ML IN SOLN: 3.0000 mL | RESPIRATORY_TRACT | Status: DC | PRN

## 2016-08-16 MED ORDER — ALPRAZOLAM 0.5 MG PO TABS
1.0000 mg | ORAL_TABLET | Freq: Two times a day (BID) | ORAL | Status: DC | PRN
  Administered 2016-08-16 – 2016-08-23 (×9): 1 mg via ORAL
  Filled 2016-08-16 (×5): qty 2
  Filled 2016-08-16: qty 4
  Filled 2016-08-16 (×6): qty 2

## 2016-08-16 MED ORDER — IPRATROPIUM-ALBUTEROL 0.5-2.5 (3) MG/3ML IN SOLN
3.0000 mL | RESPIRATORY_TRACT | Status: DC
  Administered 2016-08-16 – 2016-08-17 (×5): 3 mL via RESPIRATORY_TRACT
  Filled 2016-08-16 (×5): qty 3

## 2016-08-16 MED ORDER — SODIUM CHLORIDE 0.9 % IV SOLN: 250.0000 mL | INTRAVENOUS | Status: DC | PRN

## 2016-08-16 MED ORDER — ALBUTEROL SULFATE (2.5 MG/3ML) 0.083% IN NEBU: 2.5000 mg | INHALATION_SOLUTION | RESPIRATORY_TRACT | Status: AC | PRN

## 2016-08-16 MED ORDER — METHYLPREDNISOLONE SODIUM SUCC 125 MG IJ SOLR
60.0000 mg | Freq: Two times a day (BID) | INTRAMUSCULAR | Status: DC
  Administered 2016-08-16: 60 mg via INTRAVENOUS
  Filled 2016-08-16: qty 2

## 2016-08-16 MED ORDER — OXYCODONE HCL 5 MG PO TABS
15.0000 mg | ORAL_TABLET | Freq: Four times a day (QID) | ORAL | Status: DC | PRN
  Administered 2016-08-16 – 2016-08-25 (×18): 15 mg via ORAL
  Filled 2016-08-16 (×21): qty 3

## 2016-08-16 MED ORDER — TRAZODONE HCL 100 MG PO TABS
300.0000 mg | ORAL_TABLET | Freq: Every day | ORAL | Status: DC
  Administered 2016-08-16 – 2016-08-24 (×8): 300 mg via ORAL
  Filled 2016-08-16 (×9): qty 3

## 2016-08-16 MED ORDER — ONDANSETRON HCL 4 MG/2ML IJ SOLN: 4.0000 mg | Freq: Four times a day (QID) | INTRAMUSCULAR | Status: DC | PRN

## 2016-08-16 MED ORDER — NITROGLYCERIN 2 % TD OINT
1.0000 [in_us] | TOPICAL_OINTMENT | Freq: Four times a day (QID) | TRANSDERMAL | Status: DC
Start: 2016-08-16 — End: 2016-08-16
  Administered 2016-08-16: 1 [in_us] via TOPICAL
  Filled 2016-08-16: qty 1

## 2016-08-16 MED ORDER — BUPROPION HCL ER (XL) 150 MG PO TB24
450.0000 mg | ORAL_TABLET | Freq: Every day | ORAL | Status: DC
  Administered 2016-08-16 – 2016-08-25 (×10): 450 mg via ORAL
  Filled 2016-08-16 (×11): qty 3

## 2016-08-16 MED ORDER — ALBUTEROL SULFATE (2.5 MG/3ML) 0.083% IN NEBU
5.0000 mg | INHALATION_SOLUTION | RESPIRATORY_TRACT | Status: DC | PRN
  Administered 2016-08-16: 5 mg via RESPIRATORY_TRACT
  Filled 2016-08-16: qty 6

## 2016-08-16 NOTE — Progress Notes (Addendum)
Pending admission per dr. Betsey Holiday  70 year old lady with past medical history hypertension, hyperlipidemia, COPD, GERD, depression, anxiety, who presents with shortness of breath for 1 week, which has worsened today. She also has exertional chest pain radiating to the right arm. No fever or chills. Patient also has wheezing per EDP. Chest x-ray showed interstitial edema with small bilateral pleural effusion. CT angiogram is negative for PE, but showed R heart failure pattern. BNP 251. Patient has tachycardia, tachypnea, temperature 99.2, WBC 6.1, electrolytes and renal function okay, initial troponin negative. Patient was given 1 dose of Lasix 40 mg 1 and start with Nitro patch. Pt is accepted to SDU as in pt.  Ivor Costa, MD  [facility] Pager [phone]  If 7PM-7AM, please contact night-coverage [email or web address] Password [email or web address] 08/16/2016, 4:53 AM

## 2016-08-16 NOTE — H&P (Signed)
History and Physical    Traci [name] HBZ:[id number] DOB: 1946-04-15 DOA: 08/15/2016   PCP: Traci Nasuti, MD   Patient coming from/Resides with: Private residence  Admission status: Inpatient/telemetry -medically necessary to stay a minimum 2 midnights to rule out impending and/or unexpected changes in physiologic status that may differ from initial evaluation performed in the ER and/or at time of admission. Patient presents with acute hypoxemic respiratory failure that appears to be multifactorial in etiology: COPD bronchitis new onset heart failure. Patient will require O2 therapies, echocardiogram to determine subtype of heart failure, IV steroids and IV Lasix, nebulizer treatments and telemetry monitoring.  Chief Complaint: Shortness of breath  HPI: Traci [name] is a 70 y.o. female with medical history significant for COPD with ongoing tobacco abuse, hypertension, chronic resting tremor, anxiety disorder, chronic pain, dyslipidemia and remote history of MI. Patient reports symptoms of shortness of breath and right-sided chest discomfort radiating to the right arm for at least one week. Shortness of breath has been associated with wheezing and productive cough of clear and blood-tinged sputum. Chest pain has been waxing and waning in nature but only began after patient developed the respiratory symptoms. Prior to development of the breast with symptoms she had not been having any chest pain. Of note patient is minimally ambulatory so unable to determine if having any exertional symptomatology. She has not had any fevers or chills. Because of her symptoms she has not smoked in about 4 days. She did receive a flu shot this year. Her room-air saturations in triage were 82%  ED Course:  Vital Signs: BP 112/56   Pulse 81   Temp 99.2 F (37.3 C) (Oral)   Resp 17   Ht 5' 8"  (1.727 m)   Wt 72.6 kg (160 lb)   SpO2 94%   BMI 24.33 kg/m  2 view chest x-ray: Small bilateral pleural effusions  left greater than right with vascular congestion noted with increased interstitial markings possibly reflecteive of mild interstitial edema CTA chest/PE protocol: No pulmonary embolism, findings consistent with right-sided heart failure with moderate pleural effusions and mild pulmonary edema as well as JVD with contrast refluxing into the hepatic veins and IVC. Also findings of emphysema and bronchitis but unable to differentiate if acute versus chronic bronchitis Lab data: Sodium 138, potassium 4.1, BUN 6, creatinine 0.92, anion gap 9, BNP 252, poc troponin 0.00, troponin less than 0.03, Procalcitonin less than 0.10, Revels count 6100 differential not obtained, hemoglobin 10.3, platelets 245,000, ESR 35 Medications and treatments: Proventil neb 2, Solu-Medrol 125 mg IV 1, Lasix 40 mg IV 1, DuoNeb 1, nitroglycerin 2% ointment 1 inch 1, Tylenol 650 mg 1  Review of Systems:  In addition to the HPI above,  No Fever-chills, myalgias or other constitutional symptoms No Headache, changes with Vision or hearing, new weakness, tingling, numbness in any extremity, dizziness, dysarthria or word finding difficulty, gait disturbance or imbalance, tremors or seizure activity No problems swallowing food or Liquids, indigestion/reflux, choking or coughing while eating, abdominal pain with or after eating No Chest pain, Cough or Shortness of Breath, palpitations, orthopnea or DOE No Abdominal pain, N/V, melena,hematochezia, dark tarry stools, constipation No dysuria, malodorous urine, hematuria or flank pain No new skin rashes, lesions, masses or bruises, No new joint pains, aches, swelling or redness No recent unintentional weight gain or loss No polyuria, polydypsia or polyphagia   Past Medical History:  Diagnosis Date  . Anginal pain ([place])   . Anxiety   .  Arthritis   . Chronic lower back pain   . COPD (chronic obstructive pulmonary disease) ([place])   . Depression   . Dyspepsia   . GERD  (gastroesophageal reflux disease)   . H/O hiatal hernia   . Headache(784.0)   . History of blood transfusion 1963   w/childbirth  . History of bronchitis   . Hyperlipidemia   . Hypertension   . Shortness of breath 02/12/12   "all the time"  . Tremor     Past Surgical History:  Procedure Laterality Date  . BACK SURGERY    . CARDIAC CATHETERIZATION  04/19/1995   EF 65-70%  . CATARACT EXTRACTION W/ INTRAOCULAR LENS  IMPLANT, BILATERAL  2012  . CHOLECYSTECTOMY  2010  . LEFT HEART CATHETERIZATION WITH CORONARY ANGIOGRAM N/A 02/13/2012   Procedure: LEFT HEART CATHETERIZATION WITH CORONARY ANGIOGRAM;  Surgeon: Thayer Headings, MD;  Location: [name] C[facility];  Service: Cardiovascular;  Laterality: N/A;  . LUMBAR DISC SURGERY  early 2000's  . TIBIA IM NAIL INSERTION Right 03/30/2014   Procedure: INTRAMEDULLARY (IM) NAIL TIBIAL;  Surgeon: Wylene Simmer, MD;  Location: [facility];  Service: Orthopedics;  Laterality: Right;  . US ECHOCARDIOGRAPHY  12/17/2007   EF 55-60%  . VAGINAL HYSTERECTOMY  1980's    Social History   Social History  . Marital status: Married    Spouse name: Traci [name]  . Number of children: 2  . Years of education: GED   Occupational History  . Not on file.   Social History Main Topics  . Smoking status: Current Every Day Smoker    Packs/day: 1.00    Years: 50.00    Types: Cigarettes  . Smokeless tobacco: Never Used  . Alcohol use No  . Drug use: No  . Sexual activity: Yes   Other Topics Concern  . Not on file   Social History Narrative   Patient lives at home with her husband Traci [name]). Two children.   Retired.   Education GED.   Right handed.   Caffeine Four cups of coffee daily.    Mobility: Rolling walker but primarily wheelchair Work history: Retired   Allergies  Allergen Reactions  . Cephalexin Hives  . Cymbalta [Duloxetine Hcl] Other (See Comments)    Sees spiders  . Latex     " MAKES ME ITCHY "     Family History  Problem Relation Age of Onset    . Heart attack Father      Prior to Admission medications   Medication Sig Start Date End Date Taking? Authorizing Provider  albuterol (PROVENTIL HFA;VENTOLIN HFA) 108 (90 BASE) MCG/ACT inhaler Inhale 2 puffs into the lungs every 6 (six) hours as needed for wheezing or shortness of breath.   Yes Historical Provider, MD  ALPRAZolam Duanne Moron) 1 MG tablet Take 1 mg by mouth 2 (two) times daily as needed for anxiety.  02/10/14  Yes Historical Provider, MD  buPROPion (WELLBUTRIN XL) 150 MG 24 hr tablet Take 450 mg by mouth daily.    Yes Historical Provider, MD  busPIRone (BUSPAR) 10 MG tablet Take 10 mg by mouth 2 (two) times daily.    Yes Historical Provider, MD  Cholecalciferol (VITAMIN D3) 2000 units TABS Take 2,000 Units by mouth daily.   Yes Historical Provider, MD  citalopram (CELEXA) 20 MG tablet Take 20 mg by mouth daily.   Yes Historical Provider, MD  Indacaterol-Glycopyrrolate (UTIBRON NEOHALER) 27.5-15.6 MCG CAPS Place 1 capsule into inhaler and inhale 2 (two) times daily. 08/01/16  Yes Praveen Mannam, MD  levothyroxine (SYNTHROID, LEVOTHROID) 25 MCG tablet Take 25 mcg by mouth daily before breakfast.  08/31/14  Yes Historical Provider, MD  omeprazole (PRILOSEC) 20 MG capsule Take 20 mg by mouth daily.   Yes Historical Provider, MD  oxyCODONE (ROXICODONE) 15 MG immediate release tablet Take 15 mg by mouth every 6 (six) hours as needed for pain.   Yes Historical Provider, MD  primidone (MYSOLINE) 50 MG tablet Take 2 tablets (100 mg total) by mouth 3 (three) times daily. Patient taking differently: Take 50 mg by mouth 4 (four) times daily.  09/16/14  Yes Marcial Pacas, MD  traZODone (DESYREL) 150 MG tablet Take 300 mg by mouth at bedtime.  08/31/14  Yes Historical Provider, MD  Turmeric 500 MG TABS Take 500 mg by mouth daily.   Yes Historical Provider, MD  vitamin B-12 (CYANOCOBALAMIN) 1000 MCG tablet Take 1,000 mcg by mouth daily.   Yes Historical Provider, MD    Physical Exam: Vitals:    08/16/16 0830 08/16/16 0900 08/16/16 0906 08/16/16 0930  BP: 124/61  118/62 112/56  Pulse: 89  83 81  Resp: 20  13 17   Temp:      TempSrc:      SpO2: 95% (!) 89% 97% 94%  Weight:      Height:          Constitutional: NAD, calm, comfortable Eyes: PERRL, lids and conjunctivae normal ENMT: Mucous membranes are moist. Posterior pharynx clear of any exudate or lesions.Normal dentition.  Neck: normal, supple, no masses, no thyromegaly Respiratory:Coarse to auscultation posteriorly or with bilateral crackles from mid fields to bases Normal respiratory effort. No accessory muscle use. Right-sided chest pain reproducible with palpation of her anterior chest wall nasal cannula oxygen Cardiovascular: Regular rate and rhythm, no murmurs / rubs / gallops. No extremity edema. 2+ pedal pulses. No carotid bruits.  Abdomen: no tenderness, no masses palpated. No hepatosplenomegaly. Bowel sounds positive.  Musculoskeletal: no clubbing / cyanosis. No joint deformity upper and lower extremities. Good ROM, no contractures. Normal muscle tone.  Skin: no rashes, lesions, ulcers. No induration Neurologic: CN 2-12 grossly intact. Sensation intact, DTR normal. Strength 5/5 x all 4 extremities. Tremulous which is reported as chronic Psychiatric: Normal judgment and insight. Alert and oriented x 3. Very anxious mood.    Labs on Admission: I have personally reviewed following labs and imaging studies  CBC:  Recent Labs Lab 08/15/16 2245  WBC 6.1  HGB 10.3*  HCT 33.3*  MCV 89.5  PLT 976   Basic Metabolic Panel:  Recent Labs Lab 08/15/16 2245  NA 138  K 4.1  CL 103  CO2 26  GLUCOSE 87  BUN 6  CREATININE 0.92  CALCIUM 9.0   GFR: Estimated Creatinine Clearance: 55.8 mL/min (by C-G formula based on SCr of 0.92 mg/dL). Liver Function Tests: No results for input(s): AST, ALT, ALKPHOS, BILITOT, PROT, ALBUMIN in the last 168 hours. No results for input(s): LIPASE, AMYLASE in the last 168 hours. No  results for input(s): AMMONIA in the last 168 hours. Coagulation Profile: No results for input(s): INR, PROTIME in the last 168 hours. Cardiac Enzymes:  Recent Labs Lab 08/16/16 0807  TROPONINI <0.03   BNP (last 3 results) No results for input(s): PROBNP in the last 8760 hours. HbA1C: No results for input(s): HGBA1C in the last 72 hours. CBG: No results for input(s): GLUCAP in the last 168 hours. Lipid Profile: No results for input(s): CHOL, HDL, LDLCALC, TRIG, CHOLHDL, LDLDIRECT in  the last 72 hours. Thyroid Function Tests: No results for input(s): TSH, T4TOTAL, FREET4, T3FREE, THYROIDAB in the last 72 hours. Anemia Panel:  Recent Labs  08/16/16 0917  RETICCTPCT 2.3   Urine analysis:    Component Value Date/Time   COLORURINE YELLOW 03/30/2008 [place] 03/30/2008 1638   LABSPEC 1.040 (H) 03/30/2008 1638   PHURINE 5.5 03/30/2008 1638   GLUCOSEU NEGATIVE 03/30/2008 1638   HGBUR MODERATE (A) 03/30/2008 1638   BILIRUBINUR NEGATIVE 03/30/2008 1638   KETONESUR NEGATIVE 03/30/2008 1638   PROTEINUR NEGATIVE 03/30/2008 1638   UROBILINOGEN 0.2 03/30/2008 1638   NITRITE NEGATIVE 03/30/2008 1638   LEUKOCYTESUR NEGATIVE 03/30/2008 1638   Sepsis Labs: @LABRCNTIP (procalcitonin:4,lacticidven:4) )No results found for this or any previous visit (from the past 240 hour(s)).   Radiological Exams on Admission: Dg Chest 2 View  Result Date: 08/15/2016 CLINICAL DATA:  Subacute onset of shortness of breath and chronic cough. Initial encounter. EXAM: CHEST  2 VIEW COMPARISON:  Chest radiograph performed 08/01/2016 FINDINGS: Small bilateral pleural effusions are seen, left greater than right. Vascular congestion is seen. Increased interstitial markings may reflect mild interstitial edema. The lungs are well-aerated. There is no evidence of pneumothorax. The heart is normal in size; the mediastinal contour is within normal limits. No acute osseous abnormalities are seen.  IMPRESSION: Small bilateral pleural effusions, left greater than right. Vascular congestion noted. Increased interstitial markings may reflect mild interstitial edema. Electronically Signed   By: Garald Balding M.D.   On: 08/15/2016 23:20   Ct Angio Chest Pe W Or Wo Contrast  Result Date: 08/16/2016 CLINICAL DATA:  Chest pain and shortness of breath. EXAM: CT ANGIOGRAPHY CHEST WITH CONTRAST TECHNIQUE: Multidetector CT imaging of the chest was performed using the standard protocol during bolus administration of intravenous contrast. Multiplanar CT image reconstructions and MIPs were obtained to evaluate the vascular anatomy. CONTRAST:  100 cc Isovue 370 IV COMPARISON:  Radiographs yesterday and 08/01/2016 FINDINGS: Cardiovascular: There are no filling defects within the pulmonary arteries to suggest pulmonary embolus. Atherosclerosis of the thoracic aorta without aneurysm or evidence of dissection. Minimal contrast refluxing into the hepatic veins and IVC. There is jugular venous distention. No pericardial fluid. Scattered coronary artery calcifications. Mediastinum/Nodes: No mediastinal, hilar, or axillary adenopathy. The esophagus is decompressed. Questionable debris within the dependent trachea, motion artifact limits assessment. Lungs/Pleura: Moderate bilateral pleural effusions. Moderate emphysema. Peripheral opacity just distal to the small blood in the right upper lobe may be chronic scarring or focal airspace pneumonia. There is mild smooth septal thickening. Calcified granuloma in the left upper lobe. Compressive atelectasis adjacent to pleural effusions. Relatively severe bronchial thickening is most prominent in the lower lobes. Fluid distends the azygos fissure. Upper Abdomen: No acute abnormality. Musculoskeletal: There are no acute or suspicious osseous abnormalities. Scattered calcified intervertebral discs. Review of the MIP images confirms the above findings. IMPRESSION: 1. No pulmonary embolus.  2. Findings consistent with right heart/congestive heart failure. Moderate pleural effusions, mild pulmonary edema, jugular venous distention and contrast refluxing into the hepatic veins and IVC. 3. Emphysema. Focal opacity just distal to a small blood in the right upper lobe may be chronic scarring or pneumonia. 4. Bronchial thickening, advanced, is most prominent in the lower lobes, may be acute or chronic. 5. Thoracic aortic atherosclerosis.  Coronary artery calcifications. Electronically Signed   By: Jeb Levering M.D.   On: 08/16/2016 04:31    EKG: (Independently reviewed) sinus tachycardia with ventricular rate 110 bpm, QTC 468 ms,  borderline voltage criteria for LVH, nonspecific ST segment changes in inferior lateral leads all of which are unchanged when compared to previous EKG  Assessment/Plan Principal Problem:   Acute respiratory failure with hypoxia  -Presents with shortness of breath and pulse oximetry reading 82% on room air in triage in setting of cough chest pain and wheezing for 1 week -Differential includes COPD exacerbation of bronchitis, new onset CHF -Treat underlying causes -Continue supportive care noting was not utilizing oxygen prior to admission  Active Problems:   COPD with acute bronchitis  -Uncertain if bronchitis acute or chronic based on CT -Procalcitonin less than 0.10 so likely bacterial etiology so we'll defer empiric antibiotics at this juncture -Check respiratory viral panel -Continue steroids every 12 hours -Check ESR which was elevated at 35 -prn duo nebs -Continue home Utibron MDI -Not O2 dependent at home but also minimally active so may not be aware of possible chronic hypoxemia    CHF (congestive heart failure)  -New diagnosis -CT suggestive of right heart failure and I suspect this is related to cor pulmonale from COPD  -Echo -Blood pressure somewhat suboptimal and was having what appeared to be pleuritic chest pain focus on diuresis with  Lasix and not begin beta blocker or ACE inhibitor    HTN (hypertension) -Not on medications prior to admission    Chest pain syndrome -Apparent history of remote MI -Patient reports atypical chest pain onset at rest waxing and waning in quality without any other associated symptoms and also somewhat reproducible palpation over chest wall -EKG reassuring -Initial troponins have been negative but we'll continue to cycle    Essential tremor -Continue Mysoline    Anemia -New problem -Hgb in 2015 was 11.6 with current hemoglobin 10.3 -Check TSH and anemia panel    HLD (hyperlipidemia) -Not on statin    Anxiety disorder -Continue Xanax, Wellbutrin, BuSpar, Celexa and trazodone    Chronic back pain -Continue home oxycodone -Chronic pain so will not be offered refills at time of discharge      DVT prophylaxis: Lovenox Code Status: Full  Family Communication: Family at bedside  Disposition Plan: Anticipate discharge back to preadmission home environment when medically stable-primarily wheelchair-bound at baseline Consults called: None    ELLIS,ALLISON L. ANP-BC [facility] Pager [phone]   If 7PM-7AM, please contact night-coverage [email or web address] Password [facility]  08/16/2016, 9:55 AM

## 2016-08-16 NOTE — Progress Notes (Signed)
BRAELEY HEGGINS [id number] Admission Data: 08/16/2016 6:47 PM Attending Provider: Waldemar Dickens, MD  [id number], Rosalyn Charters, MD Consults/ Treatment Team:   Traci [name] is a 70 y.o. female patient admitted from ED awake, alert  & orientated  X 3,  Full Code, VSS - Blood pressure (!) 100/49, pulse 82, temperature 98.1 F (36.7 C), temperature source Oral, resp. rate (!) 24, height 5\' 8"  (1.727 m), weight 70.3 kg (154 lb 14.4 oz), SpO2 97 %., O2    3 L nasal cannular, no c/o shortness of breath, no c/o chest pain, no distress noted. Tele # 18 placed and pt is currently running:normal sinus rhythm.   IV site WDL:  antecubital left, condition patent and no redness with a transparent dsg that's clean dry and intact.  Allergies:   Allergies  Allergen Reactions  . Cephalexin Hives  . Cymbalta [Duloxetine Hcl] Other (See Comments)    Sees spiders  . Latex     " MAKES ME ITCHY "      Past Medical History:  Diagnosis Date  . Anginal pain ([place])   . Anxiety   . Arthritis   . Chronic bronchitis ([place])   . Chronic lower back pain   . COPD (chronic obstructive pulmonary disease) ([place])   . Depression   . Dyspepsia   . GERD (gastroesophageal reflux disease)   . H/O hiatal hernia   . [id number])    "1-2/week" (08/16/2016)  . History of blood transfusion 1963   w/childbirth  . Hyperlipidemia   . Hypertension   . Hypothyroidism   . Shortness of breath 02/12/12   "all the time"  . Tremor     History:  obtained from the patient.  Pt orientation to unit, room and routine. Information packet given to patient/family and safety video watched.  Admission INP armband ID verified with patient/family, and in place. SR up x 2, fall risk assessment complete with Patient and family verbalizing understanding of risks associated with falls. Pt verbalizes an understanding of how to use the call bell and to call for help before getting out of bed.  Skin, clean-dry- intact without evidence of bruising, or  skin tears.   No evidence of skin break down noted on exam. no rashes, no ecchymoses, no petechiae    Will cont to monitor and assist as needed.  Mihran Lebarron, Thornell Mule, RN 08/16/2016 6:47 PM

## 2016-08-16 NOTE — Care Management Note (Addendum)
Case Management Note  Patient Details  Name: Traci [name] MRN: [id number] Date of Birth: 20-Jan-1946  Subjective/Objective:                  From home with spouse./ 70 y.o. female with a PMHx of COPD who presents to the Emergency Department complaining of intermittent moderate SOB   Action/Plan: Follow for disposition needs./ Admit to INPATIENT; Heart failure home health screen and may place order for PT/OT eval and treat if indicated order.   Expected Discharge Date:  08/19/16               Expected Discharge Plan:  [place]  In-House Referral:  NA  Discharge planning Services  CM Consult  Status of Service:  In process, will continue to follow    Additional Comments:  Fuller Mandril, RN 08/16/2016, 9:42 AM

## 2016-08-16 NOTE — ED Notes (Signed)
NT at the bedside to take patient upstairs.

## 2016-08-16 NOTE — ED Notes (Signed)
Patient transported to CT 

## 2016-08-16 NOTE — ED Notes (Signed)
Phlebotomy at the bedside  

## 2016-08-16 NOTE — Progress Notes (Signed)
Received from [facility]

## 2016-08-17 ENCOUNTER — Inpatient Hospital Stay (HOSPITAL_COMMUNITY): Payer: Medicare Other

## 2016-08-17 DIAGNOSIS — D508 Other iron deficiency anemias: Secondary | ICD-10-CM

## 2016-08-17 DIAGNOSIS — I509 Heart failure, unspecified: Secondary | ICD-10-CM

## 2016-08-17 LAB — ECHOCARDIOGRAM COMPLETE
HEIGHTINCHES: 68 in
WEIGHTICAEL: 2481.76 [oz_av]

## 2016-08-17 LAB — BASIC METABOLIC PANEL
ANION GAP: 9 (ref 5–15)
BUN: 9 mg/dL (ref 6–20)
CO2: 27 mmol/L (ref 22–32)
Calcium: 8.9 mg/dL (ref 8.9–10.3)
Chloride: 102 mmol/L (ref 101–111)
Creatinine, Ser: 0.98 mg/dL (ref 0.44–1.00)
GFR, EST NON AFRICAN AMERICAN: 56 mL/min — AB (ref >60)
Glucose, Bld: 139 mg/dL — ABNORMAL HIGH (ref 65–99)
POTASSIUM: 4.1 mmol/L (ref 3.5–5.1)
Sodium: 138 mmol/L (ref 135–145)

## 2016-08-17 MED ORDER — FOLIC ACID 1 MG PO TABS
1.0000 mg | ORAL_TABLET | Freq: Every day | ORAL | Status: DC
  Administered 2016-08-17 – 2016-08-25 (×8): 1 mg via ORAL
  Filled 2016-08-17 (×8): qty 1

## 2016-08-17 MED ORDER — FERROUS SULFATE 325 (65 FE) MG PO TABS
325.0000 mg | ORAL_TABLET | Freq: Two times a day (BID) | ORAL | Status: DC
  Administered 2016-08-17 – 2016-08-25 (×16): 325 mg via ORAL
  Filled 2016-08-17 (×16): qty 1

## 2016-08-17 MED ORDER — IPRATROPIUM-ALBUTEROL 0.5-2.5 (3) MG/3ML IN SOLN
3.0000 mL | Freq: Four times a day (QID) | RESPIRATORY_TRACT | Status: DC
  Administered 2016-08-18: 3 mL via RESPIRATORY_TRACT
  Filled 2016-08-17: qty 3

## 2016-08-17 MED ORDER — METHYLPREDNISOLONE SODIUM SUCC 125 MG IJ SOLR
60.0000 mg | Freq: Two times a day (BID) | INTRAMUSCULAR | Status: DC
  Administered 2016-08-17 – 2016-08-19 (×5): 60 mg via INTRAVENOUS
  Filled 2016-08-17 (×7): qty 2

## 2016-08-17 NOTE — Progress Notes (Signed)
PROGRESS NOTE                                                                                                                                                                                                             Patient Demographics:    Traci [name], is a 70 y.o. female, DOB - [birth year], [id number]  Admit date - 08/15/2016   Admitting Physician Waldemar Dickens, MD  Outpatient Primary MD for the patient is Bonnita Nasuti, MD  LOS - 1  Outpatient Specialists: Pulmonary  Chief Complaint  Patient presents with  . Chest Pain  . Shortness of Breath       Brief Narrative   70 year old female with COPD, ongoing tobacco use, hypertension, resting tremors, anxiety, chronic pain and remote history of MI presented with progressive shortness of breath and right-sided chest discomfort for almost 1 week. Patient reports dyspnea on minimal exertion associated with nonproductive cough. She also has orthopnea but no PND. Denies any fevers or chills nausea, vomiting, chest trauma, recent travel, abdominal pain, bowel urinary symptoms. In the ED she had low-grade fever with chest x-ray showing small bilateral pleural effusion suggestive of interstitial edema. CT angiogram of the chest was negative for PE but showed moderate pleural effusion and mild pulmonary edema. Patient admitted for acute congestive heart failure.   Subjective:   Patient informs of breathing to be slightly better.  Assessment  & Plan :    Principal Problem:   Acute respiratory failure with hypoxia (HCC) Secondary to acute CHF. Also has mild acute viral bronchitis. Placed on IV Lasix. Strict I/O and daily weight. Continue oxygen (currently on 4 L, titrated as tolerated). Continue nebs. Follow 2-D echo. Respiratory viral panel negative.  Active Problems: COPD with mild acute bronchitis On Solu-Medrol and DuoNeb's. Continue O2 via nasal cannula.  Counseled strongly on smoking cessation. Patient reports cutting down to 3-4 cigarettes. Supportive care with Tylenol and antitussives.  Chest pain Appears atypical and musculoskeletal, reproducible on palpation over her right side. Stable on telemetry and troponins negative.  Essential tremor Continue Mysoline  Iron deficiency anemia/folate deficiency Add iron supplement. Has low folate and will supplement as well. TSH normal.  Generalized weakness and malnutrition PT and nutrition consult. Daughter reports patient uses a rollator but needs help from her husband.   1.    Code  Status : Full code  Family Communication  : Daughter at bedside  Disposition Plan  : Possibly home with home health once clinically improved, in the next 48-72 hours  Barriers For Discharge : Active symptoms  Consults  : None  Procedures  :  CT angiogram of the chest 2-D echo  DVT Prophylaxis  :  Lovenox - Lab Results  Component Value Date   PLT 245 08/15/2016    Antibiotics  :   Anti-infectives    None        Objective:   Vitals:   08/17/16 0546 08/17/16 0547 08/17/16 0826 08/17/16 1228  BP: (!) 114/36     Pulse: 88     Resp: 19     Temp: 98.2 F (36.8 C)     TempSrc: Oral     SpO2: 95%  95% 95%  Weight:  70.4 kg (155 lb 1.8 oz)    Height:        Wt Readings from Last 3 Encounters:  08/17/16 70.4 kg (155 lb 1.8 oz)  08/01/16 72.4 kg (159 lb 9.6 oz)  09/16/14 72.6 kg (160 lb)     Intake/Output Summary (Last 24 hours) at 08/17/16 1249 Last data filed at 08/17/16 1045  Gross per 24 hour  Intake              460 ml  Output             1500 ml  Net            -1040 ml     Physical Exam  [id number] female, appears fatigued, not in distress HEENT: no pallor, moist mucosa, supple neck, temporal wasting Chest: Diminished breath sounds over bilateral lung base, no added sounds CVS: N S1&S2, no murmurs, rubs or gallop GI: soft, NT, ND, BS+ Musculoskeletal: warm, no  edema CNS: AAOX3, non focal , resting tremors    Data Review:    CBC  Recent Labs Lab 08/15/16 2245  WBC 6.1  HGB 10.3*  HCT 33.3*  PLT 245  MCV 89.5  MCH 27.7  MCHC 30.9  RDW 14.6    Chemistries   Recent Labs Lab 08/15/16 2245 08/17/16 0709  NA 138 138  K 4.1 4.1  CL 103 102  CO2 26 27  GLUCOSE 87 139*  BUN 6 9  CREATININE 0.92 0.98  CALCIUM 9.0 8.9   ------------------------------------------------------------------------------------------------------------------ No results for input(s): CHOL, HDL, LDLCALC, TRIG, CHOLHDL, LDLDIRECT in the last 72 hours.  Lab Results  Component Value Date   HGBA1C 5.3 03/28/2014   ------------------------------------------------------------------------------------------------------------------  Recent Labs  08/16/16 0917  TSH 0.662   ------------------------------------------------------------------------------------------------------------------  Recent Labs  08/16/16 0917  VITAMINB12 2,579*  FOLATE 3.6*  FERRITIN 54  TIBC 402  IRON 35  RETICCTPCT 2.3    Coagulation profile No results for input(s): INR, PROTIME in the last 168 hours.  No results for input(s): DDIMER in the last 72 hours.  Cardiac Enzymes  Recent Labs Lab 08/16/16 0807 08/16/16 1318 08/16/16 2046  TROPONINI <0.03 <0.03 <0.03   ------------------------------------------------------------------------------------------------------------------    Component Value Date/Time   BNP 251.2 (H) 08/15/2016 2245    Inpatient Medications  Scheduled Meds: . buPROPion  450 mg Oral Daily  . busPIRone  10 mg Oral BID  . cholecalciferol  2,000 Units Oral Daily  . citalopram  20 mg Oral Daily  . enoxaparin (LOVENOX) injection  40 mg Subcutaneous Q24H  . furosemide  40 mg Intravenous BID  . Indacaterol-Glycopyrrolate  1 capsule Inhalation BID  . ipratropium-albuterol  3 mL Nebulization Q4H  . levothyroxine  25 mcg Oral QAC breakfast  .  methylPREDNISolone (SOLU-MEDROL) injection  60 mg Intravenous Q6H  . pantoprazole  40 mg Oral Daily  . primidone  50 mg Oral QID  . sodium chloride flush  3 mL Intravenous Q12H  . traZODone  300 mg Oral QHS   Continuous Infusions: PRN Meds:.sodium chloride, acetaminophen, albuterol, ALPRAZolam, ipratropium-albuterol, ondansetron (ZOFRAN) IV, oxyCODONE, sodium chloride flush  Micro Results Recent Results (from the past 240 hour(s))  Respiratory Panel by PCR     Status: None   Collection Time: 08/16/16  8:30 AM  Result Value Ref Range Status   Adenovirus NOT DETECTED NOT DETECTED Final   Coronavirus 229E NOT DETECTED NOT DETECTED Final   Coronavirus HKU1 NOT DETECTED NOT DETECTED Final   Coronavirus NL63 NOT DETECTED NOT DETECTED Final   Coronavirus OC43 NOT DETECTED NOT DETECTED Final   Metapneumovirus NOT DETECTED NOT DETECTED Final   Rhinovirus / Enterovirus NOT DETECTED NOT DETECTED Final   Influenza A NOT DETECTED NOT DETECTED Final   Influenza B NOT DETECTED NOT DETECTED Final   Parainfluenza Virus 1 NOT DETECTED NOT DETECTED Final   Parainfluenza Virus 2 NOT DETECTED NOT DETECTED Final   Parainfluenza Virus 3 NOT DETECTED NOT DETECTED Final   Parainfluenza Virus 4 NOT DETECTED NOT DETECTED Final   Respiratory Syncytial Virus NOT DETECTED NOT DETECTED Final   Bordetella pertussis NOT DETECTED NOT DETECTED Final   Chlamydophila pneumoniae NOT DETECTED NOT DETECTED Final   Mycoplasma pneumoniae NOT DETECTED NOT DETECTED Final    Radiology Reports Dg Chest 2 View  Result Date: 08/17/2016 CLINICAL DATA:  CHF EXAM: CHEST  2 VIEW COMPARISON:  08/15/2016 FINDINGS: Moderate left pleural effusion. Small right pleural effusion. Left lower lobe atelectasis. Heart is normal size. No acute bony abnormality. IMPRESSION: Bilateral pleural effusions, left greater than right. Left lower lobe atelectasis. Electronically Signed   By: Rolm Baptise M.D.   On: 08/17/2016 08:12   Dg Chest 2  View  Result Date: 08/15/2016 CLINICAL DATA:  Subacute onset of shortness of breath and chronic cough. Initial encounter. EXAM: CHEST  2 VIEW COMPARISON:  Chest radiograph performed 08/01/2016 FINDINGS: Small bilateral pleural effusions are seen, left greater than right. Vascular congestion is seen. Increased interstitial markings may reflect mild interstitial edema. The lungs are well-aerated. There is no evidence of pneumothorax. The heart is normal in size; the mediastinal contour is within normal limits. No acute osseous abnormalities are seen. IMPRESSION: Small bilateral pleural effusions, left greater than right. Vascular congestion noted. Increased interstitial markings may reflect mild interstitial edema. Electronically Signed   By: Garald Balding M.D.   On: 08/15/2016 23:20   Dg Chest 2 View  Result Date: 08/01/2016 CLINICAL DATA:  Worsening shortness of breath over the past year. History of COPD, hypertension, current smoker. EXAM: CHEST  2 VIEW COMPARISON:  Portable chest x-ray of April 01, 2014 FINDINGS: The right lung is well-expanded and clear. On the left there is volume loss consistent with a small pleural effusion. The heart and pulmonary vascularity are normal. There is a calcification in the periphery of the left lung that is stable. The heart and pulmonary vascularity are normal. There calcification in the wall of the aortic arch. There there is soft tissue prominence in the left hilar region. The bony thorax exhibits no acute abnormality. IMPRESSION: New small left pleural effusion. Mild soft tissue prominence in the left  hilum may reflect lymphadenopathy. Stable changes of COPD and previous granulomatous infection. Chest CT scanning is recommended to exclude occult malignancy. Thoracic aortic atherosclerosis. These results will be called to the ordering clinician or representative by the Radiologist Assistant, and communication documented in the PACS or zVision Dashboard.  Electronically Signed   By: David  Martinique M.D.   On: 08/01/2016 14:04   Ct Angio Chest Pe W Or Wo Contrast  Result Date: 08/16/2016 CLINICAL DATA:  Chest pain and shortness of breath. EXAM: CT ANGIOGRAPHY CHEST WITH CONTRAST TECHNIQUE: Multidetector CT imaging of the chest was performed using the standard protocol during bolus administration of intravenous contrast. Multiplanar CT image reconstructions and MIPs were obtained to evaluate the vascular anatomy. CONTRAST:  100 cc Isovue 370 IV COMPARISON:  Radiographs yesterday and 08/01/2016 FINDINGS: Cardiovascular: There are no filling defects within the pulmonary arteries to suggest pulmonary embolus. Atherosclerosis of the thoracic aorta without aneurysm or evidence of dissection. Minimal contrast refluxing into the hepatic veins and IVC. There is jugular venous distention. No pericardial fluid. Scattered coronary artery calcifications. Mediastinum/Nodes: No mediastinal, hilar, or axillary adenopathy. The esophagus is decompressed. Questionable debris within the dependent trachea, motion artifact limits assessment. Lungs/Pleura: Moderate bilateral pleural effusions. Moderate emphysema. Peripheral opacity just distal to the small blood in the right upper lobe may be chronic scarring or focal airspace pneumonia. There is mild smooth septal thickening. Calcified granuloma in the left upper lobe. Compressive atelectasis adjacent to pleural effusions. Relatively severe bronchial thickening is most prominent in the lower lobes. Fluid distends the azygos fissure. Upper Abdomen: No acute abnormality. Musculoskeletal: There are no acute or suspicious osseous abnormalities. Scattered calcified intervertebral discs. Review of the MIP images confirms the above findings. IMPRESSION: 1. No pulmonary embolus. 2. Findings consistent with right heart/congestive heart failure. Moderate pleural effusions, mild pulmonary edema, jugular venous distention and contrast refluxing  into the hepatic veins and IVC. 3. Emphysema. Focal opacity just distal to a small blood in the right upper lobe may be chronic scarring or pneumonia. 4. Bronchial thickening, advanced, is most prominent in the lower lobes, may be acute or chronic. 5. Thoracic aortic atherosclerosis.  Coronary artery calcifications. Electronically Signed   By: Jeb Levering M.D.   On: 08/16/2016 04:31    Time Spent in minutes  25   Louellen Molder M.D on 08/17/2016 at 12:49 PM  Between 7am to 7pm - Pager - [phone]  After 7pm go to [email or web address] - password [facility]  [facility] -  Office  [phone]

## 2016-08-17 NOTE — Progress Notes (Signed)
  Echocardiogram 2D Echocardiogram has been performed.  Traci [name] 08/17/2016, 1:26 PM

## 2016-08-18 DIAGNOSIS — I050 Rheumatic mitral stenosis: Secondary | ICD-10-CM

## 2016-08-18 DIAGNOSIS — I10 Essential (primary) hypertension: Secondary | ICD-10-CM

## 2016-08-18 DIAGNOSIS — I080 Rheumatic disorders of both mitral and aortic valves: Secondary | ICD-10-CM

## 2016-08-18 DIAGNOSIS — R0789 Other chest pain: Secondary | ICD-10-CM

## 2016-08-18 DIAGNOSIS — I5031 Acute diastolic (congestive) heart failure: Secondary | ICD-10-CM

## 2016-08-18 LAB — BASIC METABOLIC PANEL
Anion gap: 8 (ref 5–15)
BUN: 11 mg/dL (ref 6–20)
CHLORIDE: 97 mmol/L — AB (ref 101–111)
CO2: 31 mmol/L (ref 22–32)
CREATININE: 0.99 mg/dL (ref 0.44–1.00)
Calcium: 8.8 mg/dL — ABNORMAL LOW (ref 8.9–10.3)
GFR calc Af Amer: >60 mL/min (ref >60)
GFR calc non Af Amer: 56 mL/min — ABNORMAL LOW (ref >60)
GLUCOSE: 138 mg/dL — AB (ref 65–99)
POTASSIUM: 4.4 mmol/L (ref 3.5–5.1)
Sodium: 136 mmol/L (ref 135–145)

## 2016-08-18 LAB — PROCALCITONIN: Procalcitonin: <0.1 ng/mL

## 2016-08-18 MED ORDER — MAGNESIUM HYDROXIDE 400 MG/5ML PO SUSP
5.0000 mL | Freq: Every day | ORAL | Status: DC | PRN
Start: 2016-08-18 — End: 2016-08-18

## 2016-08-18 MED ORDER — IPRATROPIUM-ALBUTEROL 0.5-2.5 (3) MG/3ML IN SOLN: 3.0000 mL | Freq: Three times a day (TID) | RESPIRATORY_TRACT | Status: DC

## 2016-08-18 MED ORDER — GUAIFENESIN-DM 100-10 MG/5ML PO SYRP: 5.0000 mL | ORAL_SOLUTION | ORAL | Status: DC | PRN

## 2016-08-18 MED ORDER — BUDESONIDE 0.25 MG/2ML IN SUSP
0.2500 mg | Freq: Two times a day (BID) | RESPIRATORY_TRACT | Status: DC
  Administered 2016-08-18 – 2016-08-25 (×11): 0.25 mg via RESPIRATORY_TRACT
  Filled 2016-08-18 (×14): qty 2

## 2016-08-18 MED ORDER — MAGNESIUM HYDROXIDE 400 MG/5ML PO SUSP
30.0000 mL | Freq: Every day | ORAL | Status: DC | PRN
  Administered 2016-08-18 – 2016-08-20 (×2): 30 mL via ORAL
  Filled 2016-08-18 (×2): qty 30

## 2016-08-18 MED ORDER — IPRATROPIUM-ALBUTEROL 0.5-2.5 (3) MG/3ML IN SOLN
3.0000 mL | RESPIRATORY_TRACT | Status: DC
  Administered 2016-08-18 (×3): 3 mL via RESPIRATORY_TRACT
  Filled 2016-08-18 (×4): qty 3

## 2016-08-18 MED ORDER — GUAIFENESIN ER 600 MG PO TB12
600.0000 mg | ORAL_TABLET | Freq: Two times a day (BID) | ORAL | Status: DC
  Administered 2016-08-18 – 2016-08-25 (×15): 600 mg via ORAL
  Filled 2016-08-18 (×16): qty 1

## 2016-08-18 NOTE — Progress Notes (Signed)
PROGRESS NOTE                                                                                                                                                                                                             Patient Demographics:    Traci [name], is a 70 y.o. female, DOB - 06/26/1946, [id number]  Admit date - 08/15/2016   Admitting Physician Waldemar Dickens, MD  Outpatient Primary MD for the patient is Bonnita Nasuti, MD  LOS - 2  Outpatient Specialists: Pulmonary  Chief Complaint  Patient presents with  . Chest Pain  . Shortness of Breath       Brief Narrative   70 year old female with COPD, ongoing tobacco use, hypertension, resting tremors, anxiety, chronic pain and remote history of MI presented with progressive shortness of breath and right-sided chest discomfort for almost 1 week. Patient reports dyspnea on minimal exertion associated with nonproductive cough. She also has orthopnea but no PND. Denies any fevers or chills nausea, vomiting, chest trauma, recent travel, abdominal pain, bowel urinary symptoms. In the ED she had low-grade fever with chest x-ray showing small bilateral pleural effusion suggestive of interstitial edema. CT angiogram of the chest was negative for PE but showed moderate pleural effusion and mild pulmonary edema. Patient admitted for acute congestive heart failure.   Subjective:   Reports her breathing to be unchanged.  Assessment  & Plan :    Principal Problem:   Acute respiratory failure with hypoxia (HCC) SPECT due to acute diastolic CHF and associated moderate mitral stenosis seen on echo. Also has mild acute viral bronchitis. -Continue IV Lasix, scheduled DuoNeb, IV Solu-Medrol. Add twice a day Pulmicort. Oxygen titrated down to 1 L. Bedside spirometry.Marland Kitchen Respiratory viral panel negative.  Active Problems: COPD with mild acute bronchitis Continue Solu-Medrol,  scheduled DuoNeb. Add Pulmicort. Continue antitussives.  Counseled strongly on smoking cessation. Patient reports cutting down to 3-4 cigarettes. Supportive care with Tylenol and antitussives.  Moderate mitral stenosis As seen on 2-D echo. Also has moderate AR. Will consult cardiology for TEE to evaluate mitral valve further given her acute symptoms.  Chest pain Appears atypical and musculoskeletal, reproducible on palpation over her right side. Stable on telemetry and troponins negative. No further symptoms.  Essential tremor Continue Mysoline  Iron deficiency anemia/folate deficiency Added iron supplement and Foley acid.  TSH normal.   Generalized weakness and malnutrition PT and nutrition consult. Daughter reports patient uses a rollator but needs help from her husband.       Code Status : Full code  Family Communication  : None at bedside  Disposition Plan  : Possibly home with home health once clinically improved,   Barriers For Discharge : Active symptoms  Consults  :   Cardiology  Procedures  :  CT angiogram of the chest 2-D echo  DVT Prophylaxis  :  Lovenox - Lab Results  Component Value Date   PLT 245 08/15/2016    Antibiotics  :   Anti-infectives    None        Objective:   Vitals:   08/18/16 1043 08/18/16 1055 08/18/16 1104 08/18/16 1200  BP:  (!) 101/37 (!) 110/45   Pulse:  86    Resp:      Temp:      TempSrc:      SpO2:  97%  97%  Weight: 69.9 kg (154 lb 3.2 oz)     Height: 5\' 9"  (1.753 m)       Wt Readings from Last 3 Encounters:  08/18/16 69.9 kg (154 lb 3.2 oz)  08/01/16 72.4 kg (159 lb 9.6 oz)  09/16/14 72.6 kg (160 lb)     Intake/Output Summary (Last 24 hours) at 08/18/16 1410 Last data filed at 08/18/16 0747  Gross per 24 hour  Intake                0 ml  Output             2400 ml  Net            -2400 ml     Physical Exam  Gen:. Extremely fatigued, has resting tremors HEENT:  moist mucosa, supple neck, temporal  wasting Chest: Few scattered rhonchi but with diminished breath sounds bilaterally CVS: N S1&S2, no murmurs, rubs or gallop GI: soft, NT, ND, Musculoskeletal: warm, no edema     Data Review:    CBC  Recent Labs Lab 08/15/16 2245  WBC 6.1  HGB 10.3*  HCT 33.3*  PLT 245  MCV 89.5  MCH 27.7  MCHC 30.9  RDW 14.6    Chemistries   Recent Labs Lab 08/15/16 2245 08/17/16 0709 08/18/16 0623  NA 138 138 136  K 4.1 4.1 4.4  CL 103 102 97*  CO2 26 27 31   GLUCOSE 87 139* 138*  BUN 6 9 11   CREATININE 0.92 0.98 0.99  CALCIUM 9.0 8.9 8.8*   ------------------------------------------------------------------------------------------------------------------ No results for input(s): CHOL, HDL, LDLCALC, TRIG, CHOLHDL, LDLDIRECT in the last 72 hours.  Lab Results  Component Value Date   HGBA1C 5.3 03/28/2014   ------------------------------------------------------------------------------------------------------------------  Recent Labs  08/16/16 0917  TSH 0.662   ------------------------------------------------------------------------------------------------------------------  Recent Labs  08/16/16 0917  VITAMINB12 2,579*  FOLATE 3.6*  FERRITIN 54  TIBC 402  IRON 35  RETICCTPCT 2.3    Coagulation profile No results for input(s): INR, PROTIME in the last 168 hours.  No results for input(s): DDIMER in the last 72 hours.  Cardiac Enzymes  Recent Labs Lab 08/16/16 0807 08/16/16 1318 08/16/16 2046  TROPONINI <0.03 <0.03 <0.03   ------------------------------------------------------------------------------------------------------------------    Component Value Date/Time   BNP 251.2 (H) 08/15/2016 2245    Inpatient Medications  Scheduled Meds: . budesonide (PULMICORT) nebulizer solution  0.25 mg Nebulization BID  . buPROPion  450 mg Oral Daily  . busPIRone  10 mg Oral BID  . cholecalciferol  2,000 Units Oral Daily  . citalopram  20 mg Oral Daily  .  enoxaparin (LOVENOX) injection  40 mg Subcutaneous Q24H  . ferrous sulfate  325 mg Oral BID WC  . folic acid  1 mg Oral Daily  . furosemide  40 mg Intravenous BID  . Indacaterol-Glycopyrrolate  1 capsule Inhalation BID  . ipratropium-albuterol  3 mL Nebulization Q4H  . levothyroxine  25 mcg Oral QAC breakfast  . methylPREDNISolone (SOLU-MEDROL) injection  60 mg Intravenous Q12H  . pantoprazole  40 mg Oral Daily  . primidone  50 mg Oral QID  . sodium chloride flush  3 mL Intravenous Q12H  . traZODone  300 mg Oral QHS   Continuous Infusions: PRN Meds:.sodium chloride, acetaminophen, albuterol, ALPRAZolam, magnesium hydroxide, ondansetron (ZOFRAN) IV, oxyCODONE, sodium chloride flush  Micro Results Recent Results (from the past 240 hour(s))  Respiratory Panel by PCR     Status: None   Collection Time: 08/16/16  8:30 AM  Result Value Ref Range Status   Adenovirus NOT DETECTED NOT DETECTED Final   Coronavirus 229E NOT DETECTED NOT DETECTED Final   Coronavirus HKU1 NOT DETECTED NOT DETECTED Final   Coronavirus NL63 NOT DETECTED NOT DETECTED Final   Coronavirus OC43 NOT DETECTED NOT DETECTED Final   Metapneumovirus NOT DETECTED NOT DETECTED Final   Rhinovirus / Enterovirus NOT DETECTED NOT DETECTED Final   Influenza A NOT DETECTED NOT DETECTED Final   Influenza B NOT DETECTED NOT DETECTED Final   Parainfluenza Virus 1 NOT DETECTED NOT DETECTED Final   Parainfluenza Virus 2 NOT DETECTED NOT DETECTED Final   Parainfluenza Virus 3 NOT DETECTED NOT DETECTED Final   Parainfluenza Virus 4 NOT DETECTED NOT DETECTED Final   Respiratory Syncytial Virus NOT DETECTED NOT DETECTED Final   Bordetella pertussis NOT DETECTED NOT DETECTED Final   Chlamydophila pneumoniae NOT DETECTED NOT DETECTED Final   Mycoplasma pneumoniae NOT DETECTED NOT DETECTED Final    Radiology Reports Dg Chest 2 View  Result Date: 08/17/2016 CLINICAL DATA:  CHF EXAM: CHEST  2 VIEW COMPARISON:  08/15/2016 FINDINGS:  Moderate left pleural effusion. Small right pleural effusion. Left lower lobe atelectasis. Heart is normal size. No acute bony abnormality. IMPRESSION: Bilateral pleural effusions, left greater than right. Left lower lobe atelectasis. Electronically Signed   By: Rolm Baptise M.D.   On: 08/17/2016 08:12   Dg Chest 2 View  Result Date: 08/15/2016 CLINICAL DATA:  Subacute onset of shortness of breath and chronic cough. Initial encounter. EXAM: CHEST  2 VIEW COMPARISON:  Chest radiograph performed 08/01/2016 FINDINGS: Small bilateral pleural effusions are seen, left greater than right. Vascular congestion is seen. Increased interstitial markings may reflect mild interstitial edema. The lungs are well-aerated. There is no evidence of pneumothorax. The heart is normal in size; the mediastinal contour is within normal limits. No acute osseous abnormalities are seen. IMPRESSION: Small bilateral pleural effusions, left greater than right. Vascular congestion noted. Increased interstitial markings may reflect mild interstitial edema. Electronically Signed   By: Garald Balding M.D.   On: 08/15/2016 23:20   Dg Chest 2 View  Result Date: 08/01/2016 CLINICAL DATA:  Worsening shortness of breath over the past year. History of COPD, hypertension, current smoker. EXAM: CHEST  2 VIEW COMPARISON:  Portable chest x-ray of April 01, 2014 FINDINGS: The right lung is well-expanded and clear. On the left there is volume loss consistent with a small pleural effusion. The heart and pulmonary vascularity are  normal. There is a calcification in the periphery of the left lung that is stable. The heart and pulmonary vascularity are normal. There calcification in the wall of the aortic arch. There there is soft tissue prominence in the left hilar region. The bony thorax exhibits no acute abnormality. IMPRESSION: New small left pleural effusion. Mild soft tissue prominence in the left hilum may reflect lymphadenopathy. Stable changes of  COPD and previous granulomatous infection. Chest CT scanning is recommended to exclude occult malignancy. Thoracic aortic atherosclerosis. These results will be called to the ordering clinician or representative by the Radiologist Assistant, and communication documented in the PACS or zVision Dashboard. Electronically Signed   By: David  Martinique M.D.   On: 08/01/2016 14:04   Ct Angio Chest Pe W Or Wo Contrast  Result Date: 08/16/2016 CLINICAL DATA:  Chest pain and shortness of breath. EXAM: CT ANGIOGRAPHY CHEST WITH CONTRAST TECHNIQUE: Multidetector CT imaging of the chest was performed using the standard protocol during bolus administration of intravenous contrast. Multiplanar CT image reconstructions and MIPs were obtained to evaluate the vascular anatomy. CONTRAST:  100 cc Isovue 370 IV COMPARISON:  Radiographs yesterday and 08/01/2016 FINDINGS: Cardiovascular: There are no filling defects within the pulmonary arteries to suggest pulmonary embolus. Atherosclerosis of the thoracic aorta without aneurysm or evidence of dissection. Minimal contrast refluxing into the hepatic veins and IVC. There is jugular venous distention. No pericardial fluid. Scattered coronary artery calcifications. Mediastinum/Nodes: No mediastinal, hilar, or axillary adenopathy. The esophagus is decompressed. Questionable debris within the dependent trachea, motion artifact limits assessment. Lungs/Pleura: Moderate bilateral pleural effusions. Moderate emphysema. Peripheral opacity just distal to the small blood in the right upper lobe may be chronic scarring or focal airspace pneumonia. There is mild smooth septal thickening. Calcified granuloma in the left upper lobe. Compressive atelectasis adjacent to pleural effusions. Relatively severe bronchial thickening is most prominent in the lower lobes. Fluid distends the azygos fissure. Upper Abdomen: No acute abnormality. Musculoskeletal: There are no acute or suspicious osseous  abnormalities. Scattered calcified intervertebral discs. Review of the MIP images confirms the above findings. IMPRESSION: 1. No pulmonary embolus. 2. Findings consistent with right heart/congestive heart failure. Moderate pleural effusions, mild pulmonary edema, jugular venous distention and contrast refluxing into the hepatic veins and IVC. 3. Emphysema. Focal opacity just distal to a small blood in the right upper lobe may be chronic scarring or pneumonia. 4. Bronchial thickening, advanced, is most prominent in the lower lobes, may be acute or chronic. 5. Thoracic aortic atherosclerosis.  Coronary artery calcifications. Electronically Signed   By: Jeb Levering M.D.   On: 08/16/2016 04:31    Time Spent in minutes  25   Louellen Molder M.D on 08/18/2016 at 2:10 PM  Between 7am to 7pm - Pager - [phone]  After 7pm go to [email or web address] - password [facility] [place]  [facility] -  Office  [phone]

## 2016-08-18 NOTE — Consult Note (Signed)
CARDIOLOGY CONSULT NOTE  Patient ID: Traci [name] MRN: [id number] DOB/AGE: 03-15-1946 70 y.o.  Admit date: 08/15/2016 Primary Physician: Bonnita Nasuti, MD Referring Physician: Dhungel  Reason for Consultation: valvular heart disease, CHF  HPI: 70 yr old woman last seen by Dr. Mare Ferrari in 2013. Has a h/o atypical chest pain. She was hospitalized in June 2013 and on 02/13/12 she underwent coronary angiography which showed nonobstructive coronary disease and showed an ejection fraction of 60-70%.  It has been noted in the past that she has gotten relief of her chest pain with sublingual nitroglycerin implying that she may have some microvascular disease related to her smoking. She also has a h/o COPD, hypertension, and anxiety and depression. She was admitted on 08/16/16 with progressive exertional dyspnea and right-sided chest discomfort developing over the course of 2 weeks, associated with a nonproductive cough. Symptoms initially began on 08/01/16 when she saw pulmonology and meds were adjusted at that time.  CXR on admission showed small b/l pleural effusions and mild interstitial edema.  CT angiography of the chest showed no pulmonary embolism, but did show right heart/CHF and moderate size pleural effusions with mild pulmonary edema with RUL chronic scarring vs pneumonia and bronchial thickening and coronary artery calcifications.  CXR 12/29: Moderate left and small right pleural effusions.  Echocardiogram 08/17/16: Technically difficult study with poor acoustic windows. Normal LV   size with EF 55-60%. Grade 2 diastolic dysfunction.    Normal RV size and systolic function. There   was moderate aortic insufficiency, this may not have been fully   visualized. The mitral valve was not well-visualized. By doppler   indices, there is moderate mitral stenosis, ?rheumatic. Would   suggest TEE to more closely visualized the valves. There was mild   pulmonary  hypertension.  ECG showed NSR with a nonspecific T wave abnormality.  Said she is feeling much better. Still has SOB and is orthopneic. No leg swelling or PND.   Allergies  Allergen Reactions  . Cephalexin Hives  . Cymbalta [Duloxetine Hcl] Other (See Comments)    Sees spiders  . Latex     " MAKES ME ITCHY "     Current Facility-Administered Medications  Medication Dose Route Frequency Provider Last Rate Last Dose  . 0.9 %  sodium chloride infusion  250 mL Intravenous PRN Samella Parr, NP      . acetaminophen (TYLENOL) tablet 650 mg  650 mg Oral Q4H PRN Samella Parr, NP   650 mg at 08/17/16 0727  . albuterol (PROVENTIL) (2.5 MG/3ML) 0.083% nebulizer solution 5 mg  5 mg Nebulization Q4H PRN Ivor Costa, MD   5 mg at 08/16/16 0901  . ALPRAZolam Duanne Moron) tablet 1 mg  1 mg Oral BID PRN Samella Parr, NP   1 mg at 08/17/16 2012  . budesonide (PULMICORT) nebulizer solution 0.25 mg  0.25 mg Nebulization BID Nishant Dhungel, MD      . buPROPion (WELLBUTRIN XL) 24 hr tablet 450 mg  450 mg Oral Daily Samella Parr, NP   450 mg at 08/18/16 1106  . busPIRone (BUSPAR) tablet 10 mg  10 mg Oral BID Samella Parr, NP   10 mg at 08/18/16 1108  . cholecalciferol (VITAMIN D) tablet 2,000 Units  2,000 Units Oral Daily Samella Parr, NP   2,000 Units at 08/18/16 1106  . citalopram (CELEXA) tablet 20 mg  20 mg Oral Daily Samella Parr, NP   20  mg at 08/18/16 1107  . enoxaparin (LOVENOX) injection 40 mg  40 mg Subcutaneous Q24H Samella Parr, NP   40 mg at 08/18/16 1108  . ferrous sulfate tablet 325 mg  325 mg Oral BID WC Nishant Dhungel, MD   325 mg at 08/18/16 1107  . folic acid (FOLVITE) tablet 1 mg  1 mg Oral Daily Nishant Dhungel, MD   1 mg at 08/18/16 1108  . furosemide (LASIX) injection 40 mg  40 mg Intravenous BID Samella Parr, NP   40 mg at 08/18/16 1106  . Indacaterol-Glycopyrrolate 27.5-15.6 MCG CAPS 1 capsule  1 capsule Inhalation BID Samella Parr, NP      .  ipratropium-albuterol (DUONEB) 0.5-2.5 (3) MG/3ML nebulizer solution 3 mL  3 mL Nebulization Q4H Nishant Dhungel, MD   3 mL at 08/18/16 1200  . levothyroxine (SYNTHROID, LEVOTHROID) tablet 25 mcg  25 mcg Oral QAC breakfast Samella Parr, NP   25 mcg at 08/18/16 1106  . magnesium hydroxide (MILK OF MAGNESIA) suspension 5 mL  5 mL Oral Daily PRN Nishant Dhungel, MD      . methylPREDNISolone sodium succinate (SOLU-MEDROL) 125 mg/2 mL injection 60 mg  60 mg Intravenous Q12H Nishant Dhungel, MD   60 mg at 08/18/16 1107  . ondansetron (ZOFRAN) injection 4 mg  4 mg Intravenous Q6H PRN Samella Parr, NP      . oxyCODONE (Oxy IR/ROXICODONE) immediate release tablet 15 mg  15 mg Oral Q6H PRN Samella Parr, NP   15 mg at 08/18/16 1107  . pantoprazole (PROTONIX) EC tablet 40 mg  40 mg Oral Daily Samella Parr, NP   40 mg at 08/18/16 1107  . primidone (MYSOLINE) tablet 50 mg  50 mg Oral QID Samella Parr, NP   50 mg at 08/18/16 1124  . sodium chloride flush (NS) 0.9 % injection 3 mL  3 mL Intravenous Q12H Samella Parr, NP   3 mL at 08/18/16 1138  . sodium chloride flush (NS) 0.9 % injection 3 mL  3 mL Intravenous PRN Samella Parr, NP      . traZODone (DESYREL) tablet 300 mg  300 mg Oral QHS Samella Parr, NP   300 mg at 08/17/16 2142    Past Medical History:  Diagnosis Date  . Anginal pain ([place])   . Anxiety   . Arthritis   . Chronic bronchitis ([place])   . Chronic lower back pain   . COPD (chronic obstructive pulmonary disease) ([place])   . Depression   . Dyspepsia   . GERD (gastroesophageal reflux disease)   . H/O hiatal hernia   . [id number])    "1-2/week" (08/16/2016)  . History of blood transfusion 1963   w/childbirth  . Hyperlipidemia   . Hypertension   . Hypothyroidism   . Shortness of breath 02/12/12   "all the time"  . Tremor     Past Surgical History:  Procedure Laterality Date  . BACK SURGERY    . CARDIAC CATHETERIZATION  04/19/1995   EF 65-70%  . CATARACT  EXTRACTION W/ INTRAOCULAR LENS  IMPLANT, BILATERAL  2012  . FRACTURE SURGERY    . LAPAROSCOPIC CHOLECYSTECTOMY  2010  . LEFT HEART CATHETERIZATION WITH CORONARY ANGIOGRAM N/A 02/13/2012   Procedure: LEFT HEART CATHETERIZATION WITH CORONARY ANGIOGRAM;  Surgeon: Thayer Headings, MD;  Location: [facility] CATH LAB;  Service: Cardiovascular;  Laterality: N/A;  . LUMBAR DISC SURGERY  early 2000's  . TIBIA IM NAIL INSERTION  Right 03/30/2014   Procedure: INTRAMEDULLARY (IM) NAIL TIBIAL;  Surgeon: Wylene Simmer, MD;  Location: [facility];  Service: Orthopedics;  Laterality: Right;  . US ECHOCARDIOGRAPHY  12/17/2007   EF 55-60%  . VAGINAL HYSTERECTOMY  1980's    Social History   Social History  . Marital status: Married    Spouse name: Mannie  . Number of children: 2  . Years of education: GED   Occupational History  . Not on file.   Social History Main Topics  . Smoking status: Current Every Day Smoker    Packs/day: 0.10    Years: 55.00    Types: Cigarettes  . Smokeless tobacco: Never Used  . Alcohol use No  . Drug use: No  . Sexual activity: No   Other Topics Concern  . Not on file   Social History Narrative   Patient lives at home with her husband [facility]). Two children.   Retired.   Education GED.   Right handed.   Caffeine Four cups of coffee daily.     No family history of premature CAD in 1st degree relatives.  Prior to Admission medications   Medication Sig Start Date End Date Taking? Authorizing Provider  albuterol (PROVENTIL HFA;VENTOLIN HFA) 108 (90 BASE) MCG/ACT inhaler Inhale 2 puffs into the lungs every 6 (six) hours as needed for wheezing or shortness of breath.   Yes Historical Provider, MD  ALPRAZolam Duanne Moron) 1 MG tablet Take 1 mg by mouth 2 (two) times daily as needed for anxiety.  02/10/14  Yes Historical Provider, MD  buPROPion (WELLBUTRIN XL) 150 MG 24 hr tablet Take 450 mg by mouth daily.    Yes Historical Provider, MD  busPIRone (BUSPAR) 10 MG tablet Take 10 mg by  mouth 2 (two) times daily.    Yes Historical Provider, MD  Cholecalciferol (VITAMIN D3) 2000 units TABS Take 2,000 Units by mouth daily.   Yes Historical Provider, MD  citalopram (CELEXA) 20 MG tablet Take 20 mg by mouth daily.   Yes Historical Provider, MD  Indacaterol-Glycopyrrolate (UTIBRON NEOHALER) 27.5-15.6 MCG CAPS Place 1 capsule into inhaler and inhale 2 (two) times daily. 08/01/16  Yes Praveen Mannam, MD  levothyroxine (SYNTHROID, LEVOTHROID) 25 MCG tablet Take 25 mcg by mouth daily before breakfast.  08/31/14  Yes Historical Provider, MD  omeprazole (PRILOSEC) 20 MG capsule Take 20 mg by mouth daily.   Yes Historical Provider, MD  oxyCODONE (ROXICODONE) 15 MG immediate release tablet Take 15 mg by mouth every 6 (six) hours as needed for pain.   Yes Historical Provider, MD  primidone (MYSOLINE) 50 MG tablet Take 2 tablets (100 mg total) by mouth 3 (three) times daily. Patient taking differently: Take 50 mg by mouth 4 (four) times daily.  09/16/14  Yes Marcial Pacas, MD  traZODone (DESYREL) 150 MG tablet Take 300 mg by mouth at bedtime.  08/31/14  Yes Historical Provider, MD  Turmeric 500 MG TABS Take 500 mg by mouth daily.   Yes Historical Provider, MD  vitamin B-12 (CYANOCOBALAMIN) 1000 MCG tablet Take 1,000 mcg by mouth daily.   Yes Historical Provider, MD     Review of systems complete and found to be negative unless listed above in HPI     Physical exam Blood pressure (!) 110/45, pulse 86, temperature 98.5 F (36.9 C), temperature source Oral, resp. rate 18, height 5\' 9"  (1.753 m), weight 154 lb 3.2 oz (69.9 kg), SpO2 97 %. General: NAD Neck: +JVD, no thyromegaly or thyroid nodule.  Lungs:  Diminished 1/4 up b/l, moreso on left. No rales/wheezes per se. CV: Nondisplaced PMI. Regular rate and rhythm, normal S1/S2, no [id number], 2/6 systolic murmur and 2/4 holodiastolic murmur along left sternal border.  No peripheral edema.   Abdomen: Soft, nontender, no distention.  Skin: Intact without  lesions or rashes.  Neurologic: Alert and oriented x 3.  Psych: Normal affect. HEENT: Normal.   ECG: Most recent ECG reviewed.  Labs:   Lab Results  Component Value Date   WBC 6.1 08/15/2016   HGB 10.3 (L) 08/15/2016   HCT 33.3 (L) 08/15/2016   MCV 89.5 08/15/2016   PLT 245 08/15/2016    Recent Labs Lab 08/18/16 0623  NA 136  K 4.4  CL 97*  CO2 31  BUN 11  CREATININE 0.99  CALCIUM 8.8*  GLUCOSE 138*   Lab Results  Component Value Date   CKTOTAL 62 02/13/2012   CKMB 1.7 02/13/2012   TROPONINI <0.03 08/16/2016    Lab Results  Component Value Date   CHOL 139 09/24/2011   CHOL 155 03/28/2011   Lab Results  Component Value Date   HDL 56.30 09/24/2011   HDL 57.10 03/28/2011   Lab Results  Component Value Date   LDLCALC 66 09/24/2011   LDLCALC 70 03/28/2011   Lab Results  Component Value Date   TRIG 85.0 09/24/2011   TRIG 140.0 03/28/2011   Lab Results  Component Value Date   CHOLHDL 2 09/24/2011   CHOLHDL 3 03/28/2011   No results found for: LDLDIRECT       Studies: Dg Chest 2 View  Result Date: 08/17/2016 CLINICAL DATA:  CHF EXAM: CHEST  2 VIEW COMPARISON:  08/15/2016 FINDINGS: Moderate left pleural effusion. Small right pleural effusion. Left lower lobe atelectasis. Heart is normal size. No acute bony abnormality. IMPRESSION: Bilateral pleural effusions, left greater than right. Left lower lobe atelectasis. Electronically Signed   By: Rolm Baptise M.D.   On: 08/17/2016 08:12    ASSESSMENT AND PLAN:  1. Acute diastolic heart failure: Has had nearly 4 L output in last 48+ hrs. BNP 251 12/27. Na and renal function normal. Continue IV Lasix 40 mg bid for now. Still orthopneic.  2. Valvular heart disease (aortic regurgitation and mitral stenosis): Suboptimal visualization by TTE with TEE recommended. I think this can be pursued once she is more compensated from a heart failure standpoint as she is still orthopneic and would need to be able to lie  relatively flat for procedure. This can likely be arranged on an outpatient basis. If elected to do while inpatient, the earliest I would suspect is 08/21/16.  3. Essential HTN: Normal and not on antihypertensive therapy.  4. Chest pain: Noncardiac.  5. Tobacco abuse disorder.  6. COPD/bronchitis: Management per IM.   Signed: Kate Sable, M.D., F.A.C.C.  08/18/2016, 1:39 PM

## 2016-08-18 NOTE — Evaluation (Signed)
Physical Therapy Evaluation Patient Details Name: Traci [name] MRN: [id number] DOB: 04-29-1946 Today's Date: 08/18/2016   History of Present Illness  Patient is a 70 yo female admitted 08/15/16 with chest pain, SOB, and weakness.  Patient with acute respiratory failure with hypoxia due to acute CHF.   PMH:  COPD, tobacco use, HTN, resting tremors, anxiety, chronic pain, MI, mitral stenosis, anemia    Clinical Impression  Patient presents with problems listed below.  Will benefit from acute PT to maximize functional mobility prior to discharge home with husband.  Patient with decreased strength and balance, impacting mobility and safety.  Recommend f/u HHPT at d/c for continued therapy.    Follow Up Recommendations Home health PT;Supervision for mobility/OOB    Equipment Recommendations  None recommended by PT    Recommendations for Other Services       Precautions / Restrictions Precautions Precautions: Fall Precaution Comments: Mult falls at home Restrictions Weight Bearing Restrictions: No      Mobility  Bed Mobility Overal bed mobility: Needs Assistance Bed Mobility: Supine to Sit;Sit to Supine     Supine to sit: Min guard;HOB elevated Sit to supine: Min guard;HOB elevated   General bed mobility comments: No physical assist needed.  Assist for safety.  Transfers Overall transfer level: Needs assistance Equipment used: Rolling walker (2 wheeled) Transfers: Sit to/from Stand Sit to Stand: Min guard         General transfer comment: Verbal cues for hand placement.  Assist to steady during transfers.  Ambulation/Gait Ambulation/Gait assistance: Min assist Ambulation Distance (Feet): 40 Feet Assistive device: Rolling walker (2 wheeled) Gait Pattern/deviations: Step-through pattern;Decreased step length - right;Decreased step length - left;Decreased stride length;Shuffle;Trunk flexed Gait velocity: decreased Gait velocity interpretation: Below normal speed for  age/gender General Gait Details: Patient with slightly unsteady gait.  Knees buckling x1 requiring assist to maintain balance/stance.  Patient reports knees buckling has caused her falls.  Stairs            Wheelchair Mobility    Modified Rankin (Stroke Patients Only)       Balance Overall balance assessment: Needs assistance;History of Falls Sitting-balance support: No upper extremity supported;Feet supported Sitting balance-Leahy Scale: Good     Standing balance support: Bilateral upper extremity supported Standing balance-Leahy Scale: Poor Standing balance comment: UE support to maintain balance.                             Pertinent Vitals/Pain Pain Assessment: No/denies pain    Home Living Family/patient expects to be discharged to:: Private residence Living Arrangements: Spouse/significant other Available Help at Discharge: Family;Available 24 hours/day (Husband 24*; 2 daughters nearby) Type of Home: Mobile home Home Access: Stairs to enter Entrance Stairs-Rails: Right;Left Entrance Stairs-Number of Steps: 2 in front; 5 in back Home Layout: One level Home Equipment: Walker - 2 wheels;Walker - 4 wheels;Bedside commode;Shower seat;Wheelchair - manual      Prior Function Level of Independence: Independent with assistive device(s);Needs assistance   Gait / Transfers Assistance Needed: Uses rollator for gait  ADL's / Homemaking Assistance Needed: Husband supervises bathing for safety.  Husband cooks and does housekeeping.        Hand Dominance   Dominant Hand: Right    Extremity/Trunk Assessment   Upper Extremity Assessment Upper Extremity Assessment: Generalized weakness (Tremors)    Lower Extremity Assessment Lower Extremity Assessment: Generalized weakness (Tremors)    Cervical / Trunk Assessment Cervical / Trunk Assessment:  Kyphotic  Communication   Communication: No difficulties (Weak/breathy voice)  Cognition Arousal/Alertness:  Awake/alert Behavior During Therapy: WFL for tasks assessed/performed Overall Cognitive Status: Within Functional Limits for tasks assessed                      General Comments      Exercises     Assessment/Plan    PT Assessment Patient needs continued PT services  PT Problem List Decreased strength;Decreased activity tolerance;Decreased balance;Decreased mobility;Decreased knowledge of use of DME;Cardiopulmonary status limiting activity          PT Treatment Interventions DME instruction;Gait training;Stair training;Functional mobility training;Therapeutic activities;Therapeutic exercise;Balance training;Patient/family education    PT Goals (Current goals can be found in the Care Plan section)  Acute Rehab PT Goals Patient Stated Goal: To get stronger PT Goal Formulation: With patient/family Time For Goal Achievement: 08/25/16 Potential to Achieve Goals: Good    Frequency Min 3X/week   Barriers to discharge        Co-evaluation               End of Session Equipment Utilized During Treatment: Gait belt;Oxygen Activity Tolerance: Patient limited by fatigue Patient left: in bed;with call bell/phone within reach;with bed alarm set;with family/visitor present Nurse Communication: Mobility status         Time: [id number] PT Time Calculation (min) (ACUTE ONLY): 18 min   Charges:   PT Evaluation $PT Eval Moderate Complexity: 1 Procedure     PT G Codes:        Despina Pole 09/15/2016, 7:12 PM Carita Pian. Sanjuana Kava, [place] Pager [phone]

## 2016-08-19 LAB — BASIC METABOLIC PANEL
ANION GAP: 8 (ref 5–15)
BUN: 13 mg/dL (ref 6–20)
CO2: 32 mmol/L (ref 22–32)
Calcium: 8.8 mg/dL — ABNORMAL LOW (ref 8.9–10.3)
Chloride: 96 mmol/L — ABNORMAL LOW (ref 101–111)
Creatinine, Ser: 1.18 mg/dL — ABNORMAL HIGH (ref 0.44–1.00)
GFR calc Af Amer: 52 mL/min — ABNORMAL LOW (ref >60)
GFR calc non Af Amer: 45 mL/min — ABNORMAL LOW (ref >60)
GLUCOSE: 112 mg/dL — AB (ref 65–99)
POTASSIUM: 4.4 mmol/L (ref 3.5–5.1)
Sodium: 136 mmol/L (ref 135–145)

## 2016-08-19 MED ORDER — IPRATROPIUM-ALBUTEROL 0.5-2.5 (3) MG/3ML IN SOLN
3.0000 mL | Freq: Two times a day (BID) | RESPIRATORY_TRACT | Status: DC
  Administered 2016-08-19 – 2016-08-25 (×10): 3 mL via RESPIRATORY_TRACT
  Filled 2016-08-19 (×12): qty 3

## 2016-08-19 MED ORDER — IPRATROPIUM-ALBUTEROL 0.5-2.5 (3) MG/3ML IN SOLN
3.0000 mL | Freq: Four times a day (QID) | RESPIRATORY_TRACT | Status: DC
  Filled 2016-08-19 (×3): qty 3

## 2016-08-19 MED ORDER — KETOROLAC TROMETHAMINE 30 MG/ML IJ SOLN
30.0000 mg | Freq: Once | INTRAMUSCULAR | Status: AC
Start: 2016-08-19 — End: 2016-08-19
  Administered 2016-08-19: 30 mg via INTRAVENOUS
  Filled 2016-08-19: qty 1

## 2016-08-19 NOTE — Progress Notes (Signed)
Patient manual blood pressure 110/40, doctor made aware. Hold pain medication but give IV lasix. No fluids at this time.

## 2016-08-19 NOTE — Progress Notes (Signed)
Pt requested oxy for pain, BP low, MD notified. New order for pain medicine placed.

## 2016-08-19 NOTE — Progress Notes (Signed)
PROGRESS NOTE                                                                                                                                                                                                             Patient Demographics:    Traci [name], is a 70 y.o. female, DOB - 1946/06/14, [id number]  Admit date - 08/15/2016   Admitting Physician Waldemar Dickens, MD  Outpatient Primary MD for the patient is Bonnita Nasuti, MD  LOS - 3  Outpatient Specialists: Pulmonary  Chief Complaint  Patient presents with  . Chest Pain  . Shortness of Breath       Brief Narrative   70 year old female with COPD, ongoing tobacco use, hypertension, resting tremors, anxiety, chronic pain and remote history of MI presented with progressive shortness of breath and right-sided chest discomfort for almost 1 week. Patient reports dyspnea on minimal exertion associated with nonproductive cough. She also has orthopnea but no PND. Denies any fevers or chills nausea, vomiting, chest trauma, recent travel, abdominal pain, bowel urinary symptoms. In the ED she had low-grade fever with chest x-ray showing small bilateral pleural effusion suggestive of interstitial edema. CT angiogram of the chest was negative for PE but showed moderate pleural effusion and mild pulmonary edema. Patient admitted for acute congestive heart failure.   Subjective:   Breathing better today.   Assessment  & Plan :    Principal Problem:   Acute respiratory failure with hypoxia (HCC) Secondary to  acute diastolic CHF and associated moderate mitral stenosis seen on echo. Also has mild acute viral bronchitis. -Continue IV Lasix, scheduled DuoNeb, IV Solu-Medrol. Add twice a day Pulmicort. Symptoms improving today.. Continue bedside spirometry. Respiratory viral panel negative.  Active Problems: COPD with mild acute bronchitis Continue Solu-Medrol, scheduled  DuoNeb. Add Pulmicort. Continue antitussives.  Counseled strongly on smoking cessation. Patient reports cutting down to 3-4 cigarettes. Supportive care with Tylenol and antitussives.  Acute diastolic CHF with Moderate mitral stenosis As seen on 2-D echo. Also has moderate AR. Cardiology consulted who recommends to continue diuresis since patient still has orthopnea before further workup can be done. If symptoms improve this could be done as outpatient.   Chest pain Atypical chest musculoskeletal and resolved.  Essential tremor Continue Mysoline  Iron deficiency anemia/folate deficiency Added iron supplement and folic acid. TSH  normal.   Generalized weakness and malnutrition PT and nutrition consult. Daughter reports patient uses a rollator but needs help from her husband.       Code Status : Full code  Family Communication  : None at bedside  Disposition Plan  : Possibly home with home health once clinically improved, (likely in the next 48 hours.)  Barriers For Discharge : Active symptoms  Consults  :  Cardiology  Procedures  :  CT angiogram of the chest 2-D echo  DVT Prophylaxis  :  Lovenox - Lab Results  Component Value Date   PLT 245 08/15/2016    Antibiotics  :   Anti-infectives    None        Objective:   Vitals:   08/19/16 0559 08/19/16 0643 08/19/16 1027 08/19/16 1030  BP: (!) 98/35 (!) 98/46 (!) 111/33 (!) 110/40  Pulse: 79  86   Resp: 18     Temp: 97.8 F (36.6 C)     TempSrc: Oral     SpO2: 99%  100%   Weight: 70.2 kg (154 lb 12.2 oz)     Height:        Wt Readings from Last 3 Encounters:  08/19/16 70.2 kg (154 lb 12.2 oz)  08/01/16 72.4 kg (159 lb 9.6 oz)  09/16/14 72.6 kg (160 lb)     Intake/Output Summary (Last 24 hours) at 08/19/16 1123 Last data filed at 08/19/16 1021  Gross per 24 hour  Intake              360 ml  Output              600 ml  Net             -240 ml     Physical Exam  Gen:. Appears fatigued, not in  distress HEENT:  moist mucosa, supple neck, temporal wasting Chest: Basilar crackles CVS: N S1&S2, no murmurs, rubs or gallop GI: soft, NT, ND, Musculoskeletal: warm, no edema     Data Review:    CBC  Recent Labs Lab 08/15/16 2245  WBC 6.1  HGB 10.3*  HCT 33.3*  PLT 245  MCV 89.5  MCH 27.7  MCHC 30.9  RDW 14.6    Chemistries   Recent Labs Lab 08/15/16 2245 08/17/16 0709 08/18/16 0623 08/19/16 0835  NA 138 138 136 136  K 4.1 4.1 4.4 4.4  CL 103 102 97* 96*  CO2 26 27 31  32  GLUCOSE 87 139* 138* 112*  BUN 6 9 11 13   CREATININE 0.92 0.98 0.99 1.18*  CALCIUM 9.0 8.9 8.8* 8.8*   ------------------------------------------------------------------------------------------------------------------ No results for input(s): CHOL, HDL, LDLCALC, TRIG, CHOLHDL, LDLDIRECT in the last 72 hours.  Lab Results  Component Value Date   HGBA1C 5.3 03/28/2014   ------------------------------------------------------------------------------------------------------------------ No results for input(s): TSH, T4TOTAL, T3FREE, THYROIDAB in the last 72 hours.  Invalid input(s): FREET3 ------------------------------------------------------------------------------------------------------------------ No results for input(s): VITAMINB12, FOLATE, FERRITIN, TIBC, IRON, RETICCTPCT in the last 72 hours.  Coagulation profile No results for input(s): INR, PROTIME in the last 168 hours.  No results for input(s): DDIMER in the last 72 hours.  Cardiac Enzymes  Recent Labs Lab 08/16/16 0807 08/16/16 1318 08/16/16 2046  TROPONINI <0.03 <0.03 <0.03   ------------------------------------------------------------------------------------------------------------------    Component Value Date/Time   BNP 251.2 (H) 08/15/2016 2245    Inpatient Medications  Scheduled Meds: . budesonide (PULMICORT) nebulizer solution  0.25 mg Nebulization BID  . buPROPion  450 mg Oral Daily  .  busPIRone  10  mg Oral BID  . cholecalciferol  2,000 Units Oral Daily  . citalopram  20 mg Oral Daily  . enoxaparin (LOVENOX) injection  40 mg Subcutaneous Q24H  . ferrous sulfate  325 mg Oral BID WC  . folic acid  1 mg Oral Daily  . furosemide  40 mg Intravenous BID  . guaiFENesin  600 mg Oral BID  . Indacaterol-Glycopyrrolate  1 capsule Inhalation BID  . ipratropium-albuterol  3 mL Nebulization QID  . levothyroxine  25 mcg Oral QAC breakfast  . methylPREDNISolone (SOLU-MEDROL) injection  60 mg Intravenous Q12H  . pantoprazole  40 mg Oral Daily  . primidone  50 mg Oral QID  . sodium chloride flush  3 mL Intravenous Q12H  . traZODone  300 mg Oral QHS   Continuous Infusions: PRN Meds:.sodium chloride, acetaminophen, albuterol, ALPRAZolam, guaiFENesin-dextromethorphan, magnesium hydroxide, ondansetron (ZOFRAN) IV, oxyCODONE, sodium chloride flush  Micro Results Recent Results (from the past 240 hour(s))  Respiratory Panel by PCR     Status: None   Collection Time: 08/16/16  8:30 AM  Result Value Ref Range Status   Adenovirus NOT DETECTED NOT DETECTED Final   Coronavirus 229E NOT DETECTED NOT DETECTED Final   Coronavirus HKU1 NOT DETECTED NOT DETECTED Final   Coronavirus NL63 NOT DETECTED NOT DETECTED Final   Coronavirus OC43 NOT DETECTED NOT DETECTED Final   Metapneumovirus NOT DETECTED NOT DETECTED Final   Rhinovirus / Enterovirus NOT DETECTED NOT DETECTED Final   Influenza A NOT DETECTED NOT DETECTED Final   Influenza B NOT DETECTED NOT DETECTED Final   Parainfluenza Virus 1 NOT DETECTED NOT DETECTED Final   Parainfluenza Virus 2 NOT DETECTED NOT DETECTED Final   Parainfluenza Virus 3 NOT DETECTED NOT DETECTED Final   Parainfluenza Virus 4 NOT DETECTED NOT DETECTED Final   Respiratory Syncytial Virus NOT DETECTED NOT DETECTED Final   Bordetella pertussis NOT DETECTED NOT DETECTED Final   Chlamydophila pneumoniae NOT DETECTED NOT DETECTED Final   Mycoplasma pneumoniae NOT DETECTED NOT  DETECTED Final    Radiology Reports Dg Chest 2 View  Result Date: 08/17/2016 CLINICAL DATA:  CHF EXAM: CHEST  2 VIEW COMPARISON:  08/15/2016 FINDINGS: Moderate left pleural effusion. Small right pleural effusion. Left lower lobe atelectasis. Heart is normal size. No acute bony abnormality. IMPRESSION: Bilateral pleural effusions, left greater than right. Left lower lobe atelectasis. Electronically Signed   By: Rolm Baptise M.D.   On: 08/17/2016 08:12   Dg Chest 2 View  Result Date: 08/15/2016 CLINICAL DATA:  Subacute onset of shortness of breath and chronic cough. Initial encounter. EXAM: CHEST  2 VIEW COMPARISON:  Chest radiograph performed 08/01/2016 FINDINGS: Small bilateral pleural effusions are seen, left greater than right. Vascular congestion is seen. Increased interstitial markings may reflect mild interstitial edema. The lungs are well-aerated. There is no evidence of pneumothorax. The heart is normal in size; the mediastinal contour is within normal limits. No acute osseous abnormalities are seen. IMPRESSION: Small bilateral pleural effusions, left greater than right. Vascular congestion noted. Increased interstitial markings may reflect mild interstitial edema. Electronically Signed   By: Garald Balding M.D.   On: 08/15/2016 23:20   Dg Chest 2 View  Result Date: 08/01/2016 CLINICAL DATA:  Worsening shortness of breath over the past year. History of COPD, hypertension, current smoker. EXAM: CHEST  2 VIEW COMPARISON:  Portable chest x-ray of April 01, 2014 FINDINGS: The right lung is well-expanded and clear. On the left there is volume loss consistent  with a small pleural effusion. The heart and pulmonary vascularity are normal. There is a calcification in the periphery of the left lung that is stable. The heart and pulmonary vascularity are normal. There calcification in the wall of the aortic arch. There there is soft tissue prominence in the left hilar region. The bony thorax exhibits  no acute abnormality. IMPRESSION: New small left pleural effusion. Mild soft tissue prominence in the left hilum may reflect lymphadenopathy. Stable changes of COPD and previous granulomatous infection. Chest CT scanning is recommended to exclude occult malignancy. Thoracic aortic atherosclerosis. These results will be called to the ordering clinician or representative by the Radiologist Assistant, and communication documented in the PACS or zVision Dashboard. Electronically Signed   By: David  Martinique M.D.   On: 08/01/2016 14:04   Ct Angio Chest Pe W Or Wo Contrast  Result Date: 08/16/2016 CLINICAL DATA:  Chest pain and shortness of breath. EXAM: CT ANGIOGRAPHY CHEST WITH CONTRAST TECHNIQUE: Multidetector CT imaging of the chest was performed using the standard protocol during bolus administration of intravenous contrast. Multiplanar CT image reconstructions and MIPs were obtained to evaluate the vascular anatomy. CONTRAST:  100 cc Isovue 370 IV COMPARISON:  Radiographs yesterday and 08/01/2016 FINDINGS: Cardiovascular: There are no filling defects within the pulmonary arteries to suggest pulmonary embolus. Atherosclerosis of the thoracic aorta without aneurysm or evidence of dissection. Minimal contrast refluxing into the hepatic veins and IVC. There is jugular venous distention. No pericardial fluid. Scattered coronary artery calcifications. Mediastinum/Nodes: No mediastinal, hilar, or axillary adenopathy. The esophagus is decompressed. Questionable debris within the dependent trachea, motion artifact limits assessment. Lungs/Pleura: Moderate bilateral pleural effusions. Moderate emphysema. Peripheral opacity just distal to the small blood in the right upper lobe may be chronic scarring or focal airspace pneumonia. There is mild smooth septal thickening. Calcified granuloma in the left upper lobe. Compressive atelectasis adjacent to pleural effusions. Relatively severe bronchial thickening is most prominent  in the lower lobes. Fluid distends the azygos fissure. Upper Abdomen: No acute abnormality. Musculoskeletal: There are no acute or suspicious osseous abnormalities. Scattered calcified intervertebral discs. Review of the MIP images confirms the above findings. IMPRESSION: 1. No pulmonary embolus. 2. Findings consistent with right heart/congestive heart failure. Moderate pleural effusions, mild pulmonary edema, jugular venous distention and contrast refluxing into the hepatic veins and IVC. 3. Emphysema. Focal opacity just distal to a small blood in the right upper lobe may be chronic scarring or pneumonia. 4. Bronchial thickening, advanced, is most prominent in the lower lobes, may be acute or chronic. 5. Thoracic aortic atherosclerosis.  Coronary artery calcifications. Electronically Signed   By: Jeb Levering M.D.   On: 08/16/2016 04:31    Time Spent in minutes  25   Louellen Molder M.D on 08/19/2016 at 11:23 AM  Between 7am to 7pm - Pager - [phone]  After 7pm go to [email or web address] - password [facility]  [facility] -  Office  [phone]

## 2016-08-20 ENCOUNTER — Inpatient Hospital Stay (HOSPITAL_COMMUNITY): Payer: Medicare Other

## 2016-08-20 DIAGNOSIS — J9601 Acute respiratory failure with hypoxia: Principal | ICD-10-CM

## 2016-08-20 LAB — BASIC METABOLIC PANEL
ANION GAP: 8 (ref 5–15)
BUN: 12 mg/dL (ref 6–20)
CALCIUM: 8.7 mg/dL — AB (ref 8.9–10.3)
CO2: 32 mmol/L (ref 22–32)
Chloride: 94 mmol/L — ABNORMAL LOW (ref 101–111)
Creatinine, Ser: 0.94 mg/dL (ref 0.44–1.00)
GFR calc Af Amer: >60 mL/min (ref >60)
GFR calc non Af Amer: 59 mL/min — ABNORMAL LOW (ref >60)
Glucose, Bld: 145 mg/dL — ABNORMAL HIGH (ref 65–99)
POTASSIUM: 3.6 mmol/L (ref 3.5–5.1)
Sodium: 134 mmol/L — ABNORMAL LOW (ref 135–145)

## 2016-08-20 LAB — PROCALCITONIN

## 2016-08-20 MED ORDER — FUROSEMIDE 10 MG/ML IJ SOLN
40.0000 mg | Freq: Once | INTRAMUSCULAR | Status: AC
  Administered 2016-08-20: 40 mg via INTRAVENOUS

## 2016-08-20 MED ORDER — NITROGLYCERIN 0.4 MG SL SUBL
SUBLINGUAL_TABLET | SUBLINGUAL | Status: AC
  Filled 2016-08-20: qty 1

## 2016-08-20 MED ORDER — FUROSEMIDE 10 MG/ML IJ SOLN
80.0000 mg | Freq: Two times a day (BID) | INTRAMUSCULAR | Status: DC
  Administered 2016-08-21 – 2016-08-23 (×5): 80 mg via INTRAVENOUS
  Filled 2016-08-20 (×7): qty 8

## 2016-08-20 MED ORDER — NITROGLYCERIN 0.4 MG SL SUBL
0.4000 mg | SUBLINGUAL_TABLET | SUBLINGUAL | Status: DC | PRN
  Administered 2016-08-20: 0.4 mg via SUBLINGUAL

## 2016-08-20 MED ORDER — POTASSIUM CHLORIDE CRYS ER 20 MEQ PO TBCR
40.0000 meq | EXTENDED_RELEASE_TABLET | Freq: Once | ORAL | Status: AC
  Administered 2016-08-20: 40 meq via ORAL
  Filled 2016-08-20: qty 2

## 2016-08-20 MED ORDER — POTASSIUM CHLORIDE CRYS ER 20 MEQ PO TBCR
20.0000 meq | EXTENDED_RELEASE_TABLET | Freq: Every day | ORAL | Status: DC
  Administered 2016-08-20 – 2016-08-25 (×6): 20 meq via ORAL
  Filled 2016-08-20 (×6): qty 1

## 2016-08-20 MED ORDER — PREDNISONE 20 MG PO TABS
40.0000 mg | ORAL_TABLET | Freq: Every day | ORAL | Status: DC
  Administered 2016-08-21 – 2016-08-25 (×5): 40 mg via ORAL
  Filled 2016-08-20 (×5): qty 2

## 2016-08-20 NOTE — Progress Notes (Signed)
Patient Name: Traci [name] Date of Encounter: 08/20/2016  Primary Cardiologist: [facility] Problem List     Principal Problem:   Acute respiratory failure with hypoxia ([place]) Active Problems:   HLD (hyperlipidemia)   Anxiety disorder   HTN (hypertension)   COPD with acute bronchitis (HCC)   Chest pain syndrome   CHF (congestive heart failure) (HCC)   Anemia   Chronic back pain   Hypothyroidism   Benign essential tremor   Acute congestive heart failure (HCC)     Subjective   Still dyspneic, maybe a little worse since yesterday, although she was able to walk in the hallway yesterday. Net diuresis 5L since admission, 5.5 lb lighter compared to declared admission weight, but really no weight change in last 4 days.  Inpatient Medications    Scheduled Meds: . budesonide (PULMICORT) nebulizer solution  0.25 mg Nebulization BID  . buPROPion  450 mg Oral Daily  . busPIRone  10 mg Oral BID  . cholecalciferol  2,000 Units Oral Daily  . citalopram  20 mg Oral Daily  . enoxaparin (LOVENOX) injection  40 mg Subcutaneous Q24H  . ferrous sulfate  325 mg Oral BID WC  . folic acid  1 mg Oral Daily  . furosemide  40 mg Intravenous BID  . guaiFENesin  600 mg Oral BID  . ipratropium-albuterol  3 mL Nebulization BID  . levothyroxine  25 mcg Oral QAC breakfast  . methylPREDNISolone (SOLU-MEDROL) injection  60 mg Intravenous Q12H  . pantoprazole  40 mg Oral Daily  . primidone  50 mg Oral QID  . sodium chloride flush  3 mL Intravenous Q12H  . traZODone  300 mg Oral QHS   Continuous Infusions:  PRN Meds: sodium chloride, acetaminophen, albuterol, ALPRAZolam, guaiFENesin-dextromethorphan, magnesium hydroxide, ondansetron (ZOFRAN) IV, oxyCODONE, sodium chloride flush   Vital Signs    Vitals:   08/20/16 0552 08/20/16 0759 08/20/16 0800 08/20/16 0824  BP: (!) 105/47   (!) 110/40  Pulse: 86   80  Resp: 18     Temp: 98.4 F (36.9 C)     TempSrc: Oral     SpO2: 99% 99% 99%  99%  Weight: 155 lb 6.8 oz (70.5 kg)     Height:        Intake/Output Summary (Last 24 hours) at 08/20/16 1028 Last data filed at 08/20/16 0300  Gross per 24 hour  Intake                0 ml  Output             1000 ml  Net            -1000 ml   Filed Weights   08/18/16 1043 08/19/16 0559 08/20/16 0552  Weight: 154 lb 3.2 oz (69.9 kg) 154 lb 12.2 oz (70.2 kg) 155 lb 6.8 oz (70.5 kg)    Physical Exam   Looks tired, chronically ill GEN: Well nourished, well developed, in no acute distress.  HEENT: Grossly normal.  Neck: Supple, no JVD, carotid bruits, or masses. Cardiac: RRR, no  rubs, or gallops. 2/6 systolic murmur and 2/4 holodiastolic murmur along left sternal border. No clubbing, cyanosis, edema.  Radials/DP/PT 2+ and equal bilaterally.  Respiratory:  Diminished breath sounds in both bases, otherwise respirations regular and unlabored, clear to auscultation bilaterally. GI: Soft, nontender, nondistended, BS + x 4. MS: no deformity or atrophy. Skin: warm and dry, no rash. Neuro:  Strength and sensation are intact. Psych:  AAOx3.  Normal affect.  Labs    CBC No results for input(s): WBC, NEUTROABS, HGB, HCT, MCV, PLT in the last 72 hours. Basic Metabolic Panel  Recent Labs  08/19/16 0835 08/20/16 0357  NA 136 134*  K 4.4 3.6  CL 96* 94*  CO2 32 32  GLUCOSE 112* 145*  BUN 13 12  CREATININE 1.18* 0.94  CALCIUM 8.8* 8.7*     Telemetry    SR with frequent PACs - Personally Reviewed  ECG    08/16/16 NSR with nonspecific ST changes - Personally Reviewed  Radiology    No results found.  Cardiac Studies   Echo 08/17/16  - Left ventricle: The cavity size was normal. Wall thickness was   normal. Systolic function was normal. The estimated ejection   fraction was in the range of 55% to 60%. Although no diagnostic   regional wall motion abnormality was identified, this possibility   cannot be completely excluded on the basis of this study.   Features are  consistent with a pseudonormal left ventricular   filling pattern, with concomitant abnormal relaxation and   increased filling pressure (grade 2 diastolic dysfunction). - Aortic valve: There was moderate regurgitation. - Mitral valve: Mildly to moderately calcified annulus. Mildly   calcified leaflets . The findings are consistent with moderate   stenosis. There was trivial regurgitation. Peak gradient (D): 10   mm Hg. Valve area by pressure half-time: 1.75 cm^2. - Left atrium: The atrium was mildly dilated. - Right ventricle: The cavity size was normal. Systolic function   was normal. - Tricuspid valve: Peak RV-RA gradient (S): 42 mm Hg. - Pulmonary arteries: PA peak pressure: 45 mm Hg (S). - Inferior vena cava: The vessel was normal in size. The   respirophasic diameter changes were in the normal range (= 50%),   consistent with normal central venous pressure.  Patient Profile     71 yo with acute respiratory insufficiency and CHF, on background of COPD, aortic regurgitation and mitral stenosis (both estimated to be moderate, but with suboptimal TTE images).   Assessment & Plan    Although some improvement since admission, she is still not well. It remains challenging to distinguish the relative contribution of pulmonary and cardiac disease to her condition and we may be underestimating the severity of her valvular abnormalities. Recommend TEE prior to discharge, tomorrow if schedule permits, otherwise Wednesday. If MS is dominant lesion, need to focus on slowing her heart rate down. If AI is dominant lesion, bradyardia could be deleterious. Increase diuretics.  Signed, Sanda Klein, MD  08/20/2016, 10:28 AM

## 2016-08-20 NOTE — Significant Event (Signed)
Rapid Response Event Note  Overview: Time Called: 1503 Arrival Time: 1507 Event Type: Cardiac  Initial Focused Assessment: Patient complaining of CP 10/10 Right chest/ jaw describes it as stabbing into her back. Lung sounds with crackles through out.  Heart tones regular.  BP 118/74  SR 80  RR 20  O2 sat 100% on 6L [place].  Using accessory muscles.  Per RN she has been mildly SOB since admission breathing in a similar manner.    Interventions: 12 lead EKG done Patient repositioned upright. 1 SL NTG given  Reassessed CP 3/10 PCXR done 40 mg Lasix given IV Patient more comfortable states CP 2/10 and improving. Daughter at bedside [name]  Plan of Care (if not transferred): Patient to notify RN if CP returns  RN to call if assistance needed.  Event Summary: Name of Physician Notified: Dhungel at 1510    at       Event End Time: [place]  Raliegh Ip

## 2016-08-20 NOTE — Progress Notes (Signed)
Patient reported 10/10 chest pain with shortness of breath. Provider and rapid response notified. EKG performed, vitals stable, chest xray and nitroglycerin ordered by rapid response, provider ordered IV lasix.will continue to monitor.  Betha Loa Dontrel Smethers, RN

## 2016-08-20 NOTE — Progress Notes (Signed)
PROGRESS NOTE                                                                                                                                                                                                             Patient Demographics:    Traci [name], is a 71 y.o. female, DOB - March 04, 1946, [id number]  Admit date - 08/15/2016   Admitting Physician Waldemar Dickens, MD  Outpatient Primary MD for the patient is Bonnita Nasuti, MD  LOS - 4  Outpatient Specialists: Pulmonary  Chief Complaint  Patient presents with  . Chest Pain  . Shortness of Breath       Brief Narrative   71 year old female with COPD, ongoing tobacco use, hypertension, resting tremors, anxiety, chronic pain and remote history of MI presented with progressive shortness of breath and right-sided chest discomfort for almost 1 week. Patient reports dyspnea on minimal exertion associated with nonproductive cough. She also has orthopnea but no PND. Denies any fevers or chills nausea, vomiting, chest trauma, recent travel, abdominal pain, bowel urinary symptoms. In the ED she had low-grade fever with chest x-ray showing small bilateral pleural effusion suggestive of interstitial edema. CT angiogram of the chest was negative for PE but showed moderate pleural effusion and mild pulmonary edema. Patient admitted for acute congestive heart failure.   Subjective:   Breathing better today but still desats on titrating oxygen.   Assessment  & Plan :    Principal Problem:   Acute respiratory failure with hypoxia (HCC) Secondary to  acute diastolic CHF and associated moderate mitral stenosis seen on echo. Also has mild acute viral bronchitis. -Continue IV Lasix (dose increased by cardiology today given persistent symptoms).  Active Problems: COPD with mild acute bronchitis -Transitioned to oral prednisone. Continue scheduled DuoNeb's. Add twice a day  Pulmicort. Wean oxygen as tolerated. (Still requiring 3 L via nasal cannula).  Continue bedside spirometry. Respiratory viral panel negative.  Counseled strongly on smoking cessation. Patient reports cutting down to 3-4 cigarettes. Supportive care with Tylenol and antitussives.  Acute diastolic CHF with Moderate mitral stenosis As seen on 2-D echo. Also has moderate AR. Cardiology following. Increased diuretic. Plan on TEE tomorrow.   Chest pain Atypical likely musculoskeletal. Now resolved.  Essential tremor Continue Mysoline  Iron deficiency anemia/folate deficiency Added iron supplement and folic acid. TSH normal.  Generalized weakness and malnutrition Daughter reports patient uses a rollator but needs help from her husband. PT recommends home health with supervision. Nutrition consult pending.       Code Status : Full code  Family Communication  : None at bedside  Disposition Plan  : Home pending further improvement and TEE.  Barriers For Discharge : Active symptoms  Consults  :  Cardiology  Procedures  :  CT angiogram of the chest 2-D echo TEE (pending) DVT Prophylaxis  :  Lovenox - Lab Results  Component Value Date   PLT 245 08/15/2016    Antibiotics  :   Anti-infectives    None        Objective:   Vitals:   08/20/16 0552 08/20/16 0759 08/20/16 0800 08/20/16 0824  BP: (!) 105/47   (!) 110/40  Pulse: 86   80  Resp: 18     Temp: 98.4 F (36.9 C)     TempSrc: Oral     SpO2: 99% 99% 99% 99%  Weight: 70.5 kg (155 lb 6.8 oz)     Height:        Wt Readings from Last 3 Encounters:  08/20/16 70.5 kg (155 lb 6.8 oz)  08/01/16 72.4 kg (159 lb 9.6 oz)  09/16/14 72.6 kg (160 lb)     Intake/Output Summary (Last 24 hours) at 08/20/16 1056 Last data filed at 08/20/16 0300  Gross per 24 hour  Intake                0 ml  Output             1000 ml  Net            -1000 ml     Physical Exam  Gen:. Still appears fatigued, not in  distress HEENT:  moist mucosa, supple neck, Chest: Bibasilar crackles, no rhonchi or wheeze CVS: N S1&S2, no murmurs, rubs or gallop GI: soft, NT, ND, Musculoskeletal: warm, no edema     Data Review:    CBC  Recent Labs Lab 08/15/16 2245  WBC 6.1  HGB 10.3*  HCT 33.3*  PLT 245  MCV 89.5  MCH 27.7  MCHC 30.9  RDW 14.6    Chemistries   Recent Labs Lab 08/15/16 2245 08/17/16 0709 08/18/16 0623 08/19/16 0835 08/20/16 0357  NA 138 138 136 136 134*  K 4.1 4.1 4.4 4.4 3.6  CL 103 102 97* 96* 94*  CO2 26 27 31  32 32  GLUCOSE 87 139* 138* 112* 145*  BUN 6 9 11 13 12   CREATININE 0.92 0.98 0.99 1.18* 0.94  CALCIUM 9.0 8.9 8.8* 8.8* 8.7*   ------------------------------------------------------------------------------------------------------------------ No results for input(s): CHOL, HDL, LDLCALC, TRIG, CHOLHDL, LDLDIRECT in the last 72 hours.  Lab Results  Component Value Date   HGBA1C 5.3 03/28/2014   ------------------------------------------------------------------------------------------------------------------ No results for input(s): TSH, T4TOTAL, T3FREE, THYROIDAB in the last 72 hours.  Invalid input(s): FREET3 ------------------------------------------------------------------------------------------------------------------ No results for input(s): VITAMINB12, FOLATE, FERRITIN, TIBC, IRON, RETICCTPCT in the last 72 hours.  Coagulation profile No results for input(s): INR, PROTIME in the last 168 hours.  No results for input(s): DDIMER in the last 72 hours.  Cardiac Enzymes  Recent Labs Lab 08/16/16 0807 08/16/16 1318 08/16/16 2046  TROPONINI <0.03 <0.03 <0.03   ------------------------------------------------------------------------------------------------------------------    Component Value Date/Time   BNP 251.2 (H) 08/15/2016 2245    Inpatient Medications  Scheduled Meds: . budesonide (PULMICORT) nebulizer solution  0.25 mg Nebulization  BID  .  buPROPion  450 mg Oral Daily  . busPIRone  10 mg Oral BID  . cholecalciferol  2,000 Units Oral Daily  . citalopram  20 mg Oral Daily  . enoxaparin (LOVENOX) injection  40 mg Subcutaneous Q24H  . ferrous sulfate  325 mg Oral BID WC  . folic acid  1 mg Oral Daily  . furosemide  80 mg Intravenous BID  . guaiFENesin  600 mg Oral BID  . ipratropium-albuterol  3 mL Nebulization BID  . levothyroxine  25 mcg Oral QAC breakfast  . methylPREDNISolone (SOLU-MEDROL) injection  60 mg Intravenous Q12H  . pantoprazole  40 mg Oral Daily  . potassium chloride  20 mEq Oral Daily  . primidone  50 mg Oral QID  . sodium chloride flush  3 mL Intravenous Q12H  . traZODone  300 mg Oral QHS   Continuous Infusions: PRN Meds:.sodium chloride, acetaminophen, albuterol, ALPRAZolam, guaiFENesin-dextromethorphan, magnesium hydroxide, ondansetron (ZOFRAN) IV, oxyCODONE, sodium chloride flush  Micro Results Recent Results (from the past 240 hour(s))  Respiratory Panel by PCR     Status: None   Collection Time: 08/16/16  8:30 AM  Result Value Ref Range Status   Adenovirus NOT DETECTED NOT DETECTED Final   Coronavirus 229E NOT DETECTED NOT DETECTED Final   Coronavirus HKU1 NOT DETECTED NOT DETECTED Final   Coronavirus NL63 NOT DETECTED NOT DETECTED Final   Coronavirus OC43 NOT DETECTED NOT DETECTED Final   Metapneumovirus NOT DETECTED NOT DETECTED Final   Rhinovirus / Enterovirus NOT DETECTED NOT DETECTED Final   Influenza A NOT DETECTED NOT DETECTED Final   Influenza B NOT DETECTED NOT DETECTED Final   Parainfluenza Virus 1 NOT DETECTED NOT DETECTED Final   Parainfluenza Virus 2 NOT DETECTED NOT DETECTED Final   Parainfluenza Virus 3 NOT DETECTED NOT DETECTED Final   Parainfluenza Virus 4 NOT DETECTED NOT DETECTED Final   Respiratory Syncytial Virus NOT DETECTED NOT DETECTED Final   Bordetella pertussis NOT DETECTED NOT DETECTED Final   Chlamydophila pneumoniae NOT DETECTED NOT DETECTED Final    Mycoplasma pneumoniae NOT DETECTED NOT DETECTED Final    Radiology Reports Dg Chest 2 View  Result Date: 08/17/2016 CLINICAL DATA:  CHF EXAM: CHEST  2 VIEW COMPARISON:  08/15/2016 FINDINGS: Moderate left pleural effusion. Small right pleural effusion. Left lower lobe atelectasis. Heart is normal size. No acute bony abnormality. IMPRESSION: Bilateral pleural effusions, left greater than right. Left lower lobe atelectasis. Electronically Signed   By: Rolm Baptise M.D.   On: 08/17/2016 08:12   Dg Chest 2 View  Result Date: 08/15/2016 CLINICAL DATA:  Subacute onset of shortness of breath and chronic cough. Initial encounter. EXAM: CHEST  2 VIEW COMPARISON:  Chest radiograph performed 08/01/2016 FINDINGS: Small bilateral pleural effusions are seen, left greater than right. Vascular congestion is seen. Increased interstitial markings may reflect mild interstitial edema. The lungs are well-aerated. There is no evidence of pneumothorax. The heart is normal in size; the mediastinal contour is within normal limits. No acute osseous abnormalities are seen. IMPRESSION: Small bilateral pleural effusions, left greater than right. Vascular congestion noted. Increased interstitial markings may reflect mild interstitial edema. Electronically Signed   By: Garald Balding M.D.   On: 08/15/2016 23:20   Dg Chest 2 View  Result Date: 08/01/2016 CLINICAL DATA:  Worsening shortness of breath over the past year. History of COPD, hypertension, current smoker. EXAM: CHEST  2 VIEW COMPARISON:  Portable chest x-ray of April 01, 2014 FINDINGS: The right lung is well-expanded and clear.  On the left there is volume loss consistent with a small pleural effusion. The heart and pulmonary vascularity are normal. There is a calcification in the periphery of the left lung that is stable. The heart and pulmonary vascularity are normal. There calcification in the wall of the aortic arch. There there is soft tissue prominence in the left  hilar region. The bony thorax exhibits no acute abnormality. IMPRESSION: New small left pleural effusion. Mild soft tissue prominence in the left hilum may reflect lymphadenopathy. Stable changes of COPD and previous granulomatous infection. Chest CT scanning is recommended to exclude occult malignancy. Thoracic aortic atherosclerosis. These results will be called to the ordering clinician or representative by the Radiologist Assistant, and communication documented in the PACS or zVision Dashboard. Electronically Signed   By: David  Martinique M.D.   On: 08/01/2016 14:04   Ct Angio Chest Pe W Or Wo Contrast  Result Date: 08/16/2016 CLINICAL DATA:  Chest pain and shortness of breath. EXAM: CT ANGIOGRAPHY CHEST WITH CONTRAST TECHNIQUE: Multidetector CT imaging of the chest was performed using the standard protocol during bolus administration of intravenous contrast. Multiplanar CT image reconstructions and MIPs were obtained to evaluate the vascular anatomy. CONTRAST:  100 cc Isovue 370 IV COMPARISON:  Radiographs yesterday and 08/01/2016 FINDINGS: Cardiovascular: There are no filling defects within the pulmonary arteries to suggest pulmonary embolus. Atherosclerosis of the thoracic aorta without aneurysm or evidence of dissection. Minimal contrast refluxing into the hepatic veins and IVC. There is jugular venous distention. No pericardial fluid. Scattered coronary artery calcifications. Mediastinum/Nodes: No mediastinal, hilar, or axillary adenopathy. The esophagus is decompressed. Questionable debris within the dependent trachea, motion artifact limits assessment. Lungs/Pleura: Moderate bilateral pleural effusions. Moderate emphysema. Peripheral opacity just distal to the small blood in the right upper lobe may be chronic scarring or focal airspace pneumonia. There is mild smooth septal thickening. Calcified granuloma in the left upper lobe. Compressive atelectasis adjacent to pleural effusions. Relatively severe  bronchial thickening is most prominent in the lower lobes. Fluid distends the azygos fissure. Upper Abdomen: No acute abnormality. Musculoskeletal: There are no acute or suspicious osseous abnormalities. Scattered calcified intervertebral discs. Review of the MIP images confirms the above findings. IMPRESSION: 1. No pulmonary embolus. 2. Findings consistent with right heart/congestive heart failure. Moderate pleural effusions, mild pulmonary edema, jugular venous distention and contrast refluxing into the hepatic veins and IVC. 3. Emphysema. Focal opacity just distal to a small blood in the right upper lobe may be chronic scarring or pneumonia. 4. Bronchial thickening, advanced, is most prominent in the lower lobes, may be acute or chronic. 5. Thoracic aortic atherosclerosis.  Coronary artery calcifications. Electronically Signed   By: Jeb Levering M.D.   On: 08/16/2016 04:31    Time Spent in minutes  25   Louellen Molder M.D on 08/20/2016 at 10:56 AM  Between 7am to 7pm - Pager - [phone]  After 7pm go to [email or web address] - password [email or web address]  [facility] -  Office  [phone]

## 2016-08-21 ENCOUNTER — Inpatient Hospital Stay (HOSPITAL_COMMUNITY): Payer: Medicare Other

## 2016-08-21 DIAGNOSIS — I5033 Acute on chronic diastolic (congestive) heart failure: Secondary | ICD-10-CM

## 2016-08-21 LAB — BASIC METABOLIC PANEL
Anion gap: 13 (ref 5–15)
BUN: 8 mg/dL (ref 6–20)
CALCIUM: 8.5 mg/dL — AB (ref 8.9–10.3)
CO2: 29 mmol/L (ref 22–32)
CREATININE: 0.93 mg/dL (ref 0.44–1.00)
Chloride: 94 mmol/L — ABNORMAL LOW (ref 101–111)
GFR calc Af Amer: >60 mL/min (ref >60)
GFR calc non Af Amer: 60 mL/min — ABNORMAL LOW (ref >60)
GLUCOSE: 108 mg/dL — AB (ref 65–99)
Potassium: 4.1 mmol/L (ref 3.5–5.1)
Sodium: 136 mmol/L (ref 135–145)

## 2016-08-21 MED ORDER — ALPRAZOLAM 0.5 MG PO TABS
1.0000 mg | ORAL_TABLET | Freq: Once | ORAL | Status: AC
  Administered 2016-08-21: 1 mg via ORAL

## 2016-08-21 MED ORDER — ALPRAZOLAM 0.5 MG PO TABS
1.0000 mg | ORAL_TABLET | Freq: Two times a day (BID) | ORAL | Status: DC | PRN
Start: 2016-08-21 — End: 2016-08-21

## 2016-08-21 MED ORDER — ENSURE ENLIVE PO LIQD
237.0000 mL | Freq: Two times a day (BID) | ORAL | Status: DC
  Administered 2016-08-21 – 2016-08-24 (×3): 237 mL via ORAL

## 2016-08-21 NOTE — Progress Notes (Signed)
PROGRESS NOTE                                                                                                                                                                                                             Patient Demographics:    Traci [name], is a 71 y.o. female, DOB - 1946-06-20, [id number]  Admit date - 08/15/2016   Admitting Physician Waldemar Dickens, MD  Outpatient Primary MD for the patient is Bonnita Nasuti, MD  LOS - 5  Outpatient Specialists: Pulmonary  Chief Complaint  Patient presents with  . Chest Pain  . Shortness of Breath       Brief Narrative   71 year old female with COPD, ongoing tobacco use, hypertension, resting tremors, anxiety, chronic pain and remote history of MI presented with progressive shortness of breath and right-sided chest discomfort for almost 1 week. Patient reports dyspnea on minimal exertion associated with nonproductive cough. She also has orthopnea but no PND. Denies any fevers or chills nausea, vomiting, chest trauma, recent travel, abdominal pain, bowel urinary symptoms. In the ED she had low-grade fever with chest x-ray showing small bilateral pleural effusion suggestive of interstitial edema. CT angiogram of the chest was negative for PE but showed moderate pleural effusion and mild pulmonary edema. Patient admitted for acute congestive heart failure.   Subjective:   RRT was called last evening as patient complained of chest pain. EKG was unremarkable. Vitals stable. Given Xanax and external dose of Lasix. Chest x-ray showed mild congestion. His breathing to be better this morning.   Assessment  & Plan :    Principal Problem:   Acute respiratory failure with hypoxia (HCC) Secondary to  acute diastolic CHF and associated moderate mitral stenosis seen on echo. Also has mild acute viral bronchitis. -Lasix dose increased (80 mg twice a day).. Diuresed quite  well. Lung sounds much better this morning.  Active Problems: COPD with mild acute bronchitis -Transitioned to oral prednisone, continue DuoNeb and Pulmicort. Weaning oxygen.  Continue bedside spirometry. Respiratory viral panel negative.  Counseled strongly on smoking cessation. Patient reports cutting down to 3-4 cigarettes. Supportive care with Tylenol and antitussives.  Acute diastolic CHF with Moderate mitral stenosis As seen on 2-D echo. Also has moderate AR. Cardiology following. Increased diuretic. Plan on TEE prior to discharge (cardiology will schedule)   Chest  pain Atypical likely musculoskeletal and associated with anxiety. Now resolved.  Essential tremor Continue Mysoline  Iron deficiency anemia/folate deficiency Added iron supplement and folic acid. TSH normal.   Generalized weakness and malnutrition Daughter reports patient uses a rollator but needs help from her husband. PT recommends home health with supervision. Nutrition consult pending.  Anxiety Added when necessary Xanax.     Code Status : Full code  Family Communication  : None at bedside . Will update daughter.  Disposition Plan  : Home pending further improvement and TEE.  Barriers For Discharge : Active symptoms  Consults  :  Cardiology  Procedures  :  CT angiogram of the chest 2-D echo TEE (pending)   DVT Prophylaxis  :  Lovenox - Lab Results  Component Value Date   PLT 245 08/15/2016    Antibiotics  :   Anti-infectives    None        Objective:   Vitals:   08/21/16 0500 08/21/16 0535 08/21/16 0904 08/21/16 0936  BP:  (!) 102/48  110/70  Pulse:  78 79 76  Resp:  18 16   Temp:  97.6 F (36.4 C)    TempSrc:  Oral    SpO2:  100% 98% 99%  Weight: 70.2 kg (154 lb 12.2 oz)     Height:        Wt Readings from Last 3 Encounters:  08/21/16 70.2 kg (154 lb 12.2 oz)  08/01/16 72.4 kg (159 lb 9.6 oz)  09/16/14 72.6 kg (160 lb)     Intake/Output Summary (Last 24 hours)  at 08/21/16 1138 Last data filed at 08/21/16 0400  Gross per 24 hour  Intake                0 ml  Output                1 ml  Net               -1 ml     Physical Exam  Gen:. fatigued, not in distress HEENT:  moist mucosa, supple neck, Chest: Bibasilar crackles much improved compared to yesterday, no rhonchi or wheeze CVS: N S1&S2, no murmurs, rubs or gallop GI: soft, NT, ND, Musculoskeletal: warm, no edema     Data Review:    CBC  Recent Labs Lab 08/15/16 2245  WBC 6.1  HGB 10.3*  HCT 33.3*  PLT 245  MCV 89.5  MCH 27.7  MCHC 30.9  RDW 14.6    Chemistries   Recent Labs Lab 08/15/16 2245 08/17/16 0709 08/18/16 0623 08/19/16 0835 08/20/16 0357  NA 138 138 136 136 134*  K 4.1 4.1 4.4 4.4 3.6  CL 103 102 97* 96* 94*  CO2 26 27 31  32 32  GLUCOSE 87 139* 138* 112* 145*  BUN 6 9 11 13 12   CREATININE 0.92 0.98 0.99 1.18* 0.94  CALCIUM 9.0 8.9 8.8* 8.8* 8.7*   ------------------------------------------------------------------------------------------------------------------ No results for input(s): CHOL, HDL, LDLCALC, TRIG, CHOLHDL, LDLDIRECT in the last 72 hours.  Lab Results  Component Value Date   HGBA1C 5.3 03/28/2014   ------------------------------------------------------------------------------------------------------------------ No results for input(s): TSH, T4TOTAL, T3FREE, THYROIDAB in the last 72 hours.  Invalid input(s): FREET3 ------------------------------------------------------------------------------------------------------------------ No results for input(s): VITAMINB12, FOLATE, FERRITIN, TIBC, IRON, RETICCTPCT in the last 72 hours.  Coagulation profile No results for input(s): INR, PROTIME in the last 168 hours.  No results for input(s): DDIMER in the last 72 hours.  Cardiac Enzymes  Recent Labs  Lab 08/16/16 0807 08/16/16 1318 08/16/16 2046  TROPONINI <0.03 <0.03 <0.03    ------------------------------------------------------------------------------------------------------------------    Component Value Date/Time   BNP 251.2 (H) 08/15/2016 2245    Inpatient Medications  Scheduled Meds: . budesonide (PULMICORT) nebulizer solution  0.25 mg Nebulization BID  . buPROPion  450 mg Oral Daily  . busPIRone  10 mg Oral BID  . cholecalciferol  2,000 Units Oral Daily  . citalopram  20 mg Oral Daily  . enoxaparin (LOVENOX) injection  40 mg Subcutaneous Q24H  . ferrous sulfate  325 mg Oral BID WC  . folic acid  1 mg Oral Daily  . furosemide  80 mg Intravenous BID  . guaiFENesin  600 mg Oral BID  . ipratropium-albuterol  3 mL Nebulization BID  . levothyroxine  25 mcg Oral QAC breakfast  . pantoprazole  40 mg Oral Daily  . potassium chloride  20 mEq Oral Daily  . predniSONE  40 mg Oral Q breakfast  . primidone  50 mg Oral QID  . sodium chloride flush  3 mL Intravenous Q12H  . traZODone  300 mg Oral QHS   Continuous Infusions: PRN Meds:.sodium chloride, acetaminophen, albuterol, ALPRAZolam, guaiFENesin-dextromethorphan, magnesium hydroxide, nitroGLYCERIN, ondansetron (ZOFRAN) IV, oxyCODONE, sodium chloride flush  Micro Results Recent Results (from the past 240 hour(s))  Respiratory Panel by PCR     Status: None   Collection Time: 08/16/16  8:30 AM  Result Value Ref Range Status   Adenovirus NOT DETECTED NOT DETECTED Final   Coronavirus 229E NOT DETECTED NOT DETECTED Final   Coronavirus HKU1 NOT DETECTED NOT DETECTED Final   Coronavirus NL63 NOT DETECTED NOT DETECTED Final   Coronavirus OC43 NOT DETECTED NOT DETECTED Final   Metapneumovirus NOT DETECTED NOT DETECTED Final   Rhinovirus / Enterovirus NOT DETECTED NOT DETECTED Final   Influenza A NOT DETECTED NOT DETECTED Final   Influenza B NOT DETECTED NOT DETECTED Final   Parainfluenza Virus 1 NOT DETECTED NOT DETECTED Final   Parainfluenza Virus 2 NOT DETECTED NOT DETECTED Final   Parainfluenza  Virus 3 NOT DETECTED NOT DETECTED Final   Parainfluenza Virus 4 NOT DETECTED NOT DETECTED Final   Respiratory Syncytial Virus NOT DETECTED NOT DETECTED Final   Bordetella pertussis NOT DETECTED NOT DETECTED Final   Chlamydophila pneumoniae NOT DETECTED NOT DETECTED Final   Mycoplasma pneumoniae NOT DETECTED NOT DETECTED Final    Radiology Reports Dg Chest 2 View  Result Date: 08/17/2016 CLINICAL DATA:  CHF EXAM: CHEST  2 VIEW COMPARISON:  08/15/2016 FINDINGS: Moderate left pleural effusion. Small right pleural effusion. Left lower lobe atelectasis. Heart is normal size. No acute bony abnormality. IMPRESSION: Bilateral pleural effusions, left greater than right. Left lower lobe atelectasis. Electronically Signed   By: Rolm Baptise M.D.   On: 08/17/2016 08:12   Dg Chest 2 View  Result Date: 08/15/2016 CLINICAL DATA:  Subacute onset of shortness of breath and chronic cough. Initial encounter. EXAM: CHEST  2 VIEW COMPARISON:  Chest radiograph performed 08/01/2016 FINDINGS: Small bilateral pleural effusions are seen, left greater than right. Vascular congestion is seen. Increased interstitial markings may reflect mild interstitial edema. The lungs are well-aerated. There is no evidence of pneumothorax. The heart is normal in size; the mediastinal contour is within normal limits. No acute osseous abnormalities are seen. IMPRESSION: Small bilateral pleural effusions, left greater than right. Vascular congestion noted. Increased interstitial markings may reflect mild interstitial edema. Electronically Signed   By: Garald Balding M.D.   On: 08/15/2016 23:20  Dg Chest 2 View  Result Date: 08/01/2016 CLINICAL DATA:  Worsening shortness of breath over the past year. History of COPD, hypertension, current smoker. EXAM: CHEST  2 VIEW COMPARISON:  Portable chest x-ray of April 01, 2014 FINDINGS: The right lung is well-expanded and clear. On the left there is volume loss consistent with a small pleural  effusion. The heart and pulmonary vascularity are normal. There is a calcification in the periphery of the left lung that is stable. The heart and pulmonary vascularity are normal. There calcification in the wall of the aortic arch. There there is soft tissue prominence in the left hilar region. The bony thorax exhibits no acute abnormality. IMPRESSION: New small left pleural effusion. Mild soft tissue prominence in the left hilum may reflect lymphadenopathy. Stable changes of COPD and previous granulomatous infection. Chest CT scanning is recommended to exclude occult malignancy. Thoracic aortic atherosclerosis. These results will be called to the ordering clinician or representative by the Radiologist Assistant, and communication documented in the PACS or zVision Dashboard. Electronically Signed   By: David  Martinique M.D.   On: 08/01/2016 14:04   Ct Angio Chest Pe W Or Wo Contrast  Result Date: 08/16/2016 CLINICAL DATA:  Chest pain and shortness of breath. EXAM: CT ANGIOGRAPHY CHEST WITH CONTRAST TECHNIQUE: Multidetector CT imaging of the chest was performed using the standard protocol during bolus administration of intravenous contrast. Multiplanar CT image reconstructions and MIPs were obtained to evaluate the vascular anatomy. CONTRAST:  100 cc Isovue 370 IV COMPARISON:  Radiographs yesterday and 08/01/2016 FINDINGS: Cardiovascular: There are no filling defects within the pulmonary arteries to suggest pulmonary embolus. Atherosclerosis of the thoracic aorta without aneurysm or evidence of dissection. Minimal contrast refluxing into the hepatic veins and IVC. There is jugular venous distention. No pericardial fluid. Scattered coronary artery calcifications. Mediastinum/Nodes: No mediastinal, hilar, or axillary adenopathy. The esophagus is decompressed. Questionable debris within the dependent trachea, motion artifact limits assessment. Lungs/Pleura: Moderate bilateral pleural effusions. Moderate emphysema.  Peripheral opacity just distal to the small blood in the right upper lobe may be chronic scarring or focal airspace pneumonia. There is mild smooth septal thickening. Calcified granuloma in the left upper lobe. Compressive atelectasis adjacent to pleural effusions. Relatively severe bronchial thickening is most prominent in the lower lobes. Fluid distends the azygos fissure. Upper Abdomen: No acute abnormality. Musculoskeletal: There are no acute or suspicious osseous abnormalities. Scattered calcified intervertebral discs. Review of the MIP images confirms the above findings. IMPRESSION: 1. No pulmonary embolus. 2. Findings consistent with right heart/congestive heart failure. Moderate pleural effusions, mild pulmonary edema, jugular venous distention and contrast refluxing into the hepatic veins and IVC. 3. Emphysema. Focal opacity just distal to a small blood in the right upper lobe may be chronic scarring or pneumonia. 4. Bronchial thickening, advanced, is most prominent in the lower lobes, may be acute or chronic. 5. Thoracic aortic atherosclerosis.  Coronary artery calcifications. Electronically Signed   By: Jeb Levering M.D.   On: 08/16/2016 04:31   Dg Chest Port 1 View  Result Date: 08/20/2016 CLINICAL DATA:  Acute respiratory distress EXAM: PORTABLE CHEST 1 VIEW COMPARISON:  CXR 08/17/2016 and CT 08/16/2016 FINDINGS: Heart size is normal. Aortic atherosclerosis involving the arch. Small left pleural effusion. Calcified granuloma projects over the left posterior sixth rib confirmed by recent CT. No pneumonic consolidation. No overt pulmonary edema. Probable atelectasis at the left lung base. IMPRESSION: Small left effusion. Left lower lobe atelectasis. Calcified left upper lobe granuloma. Electronically Signed  By: Ashley Royalty M.D.   On: 08/20/2016 15:33    Time Spent in minutes  25   Louellen Molder M.D on 08/21/2016 at 11:38 AM  Between 7am to 7pm - Pager - [phone]  After 7pm go to  [email or web address] - password [facility]  [facility] -  Office  [phone]

## 2016-08-21 NOTE — Progress Notes (Signed)
    [facility] has been requested to perform a transesophageal echocardiogram on 08/22/16 for CHF and Murmur.  After careful review of history and examination, the risks and benefits of transesophageal echocardiogram have been explained including risks of esophageal damage, perforation (1:10,000 risk), bleeding, pharyngeal hematoma as well as other potential complications associated with conscious sedation including aspiration, arrhythmia, respiratory failure and death. Alternatives to treatment were discussed, questions were answered. Patient is willing to proceed.   Will check CBC, INR today.  Will need to follow up on respiratory status in the morning as she is somewhat dyspneic this afternoon.   Reino Bellis, NP-C 08/21/2016 4:10 PM

## 2016-08-21 NOTE — Progress Notes (Signed)
Patient Name: Traci [name] Date of Encounter: 08/21/2016  Primary Cardiologist: former [facility] Problem List     Principal Problem:   Acute respiratory failure with hypoxia ([place]) Active Problems:   HLD (hyperlipidemia)   Anxiety disorder   HTN (hypertension)   COPD with acute bronchitis (HCC)   Chest pain syndrome   CHF (congestive heart failure) (HCC)   Anemia   Chronic back pain   Hypothyroidism   Benign essential tremor   Acute congestive heart failure (HCC)   Acute diastolic heart failure (HCC)     Subjective   Still dyspneic, maybe a little worse since yesterday, although she was able to walk in the hallway yesterday. Net diuresis 5.3L since admission, 6 lb lighter compared to declared admission weight, but really no weight change in last 4 days.  Felt acute SOB, lasix administered, rapid response. Had 10/10 stabbing CP right sided to back. Crackles noted in lungs at the time.   Inpatient Medications    Scheduled Meds: . budesonide (PULMICORT) nebulizer solution  0.25 mg Nebulization BID  . buPROPion  450 mg Oral Daily  . busPIRone  10 mg Oral BID  . cholecalciferol  2,000 Units Oral Daily  . citalopram  20 mg Oral Daily  . enoxaparin (LOVENOX) injection  40 mg Subcutaneous Q24H  . ferrous sulfate  325 mg Oral BID WC  . folic acid  1 mg Oral Daily  . furosemide  80 mg Intravenous BID  . guaiFENesin  600 mg Oral BID  . ipratropium-albuterol  3 mL Nebulization BID  . levothyroxine  25 mcg Oral QAC breakfast  . pantoprazole  40 mg Oral Daily  . potassium chloride  20 mEq Oral Daily  . predniSONE  40 mg Oral Q breakfast  . primidone  50 mg Oral QID  . sodium chloride flush  3 mL Intravenous Q12H  . traZODone  300 mg Oral QHS   Continuous Infusions:  PRN Meds: sodium chloride, acetaminophen, albuterol, ALPRAZolam, guaiFENesin-dextromethorphan, magnesium hydroxide, nitroGLYCERIN, ondansetron (ZOFRAN) IV, oxyCODONE, sodium chloride flush   Vital  Signs    Vitals:   08/21/16 0500 08/21/16 0535 08/21/16 0904 08/21/16 0936  BP:  (!) 102/48  110/70  Pulse:  78 79 76  Resp:  18 16   Temp:  97.6 F (36.4 C)    TempSrc:  Oral    SpO2:  100% 98% 99%  Weight: 154 lb 12.2 oz (70.2 kg)     Height:        Intake/Output Summary (Last 24 hours) at 08/21/16 1058 Last data filed at 08/21/16 0400  Gross per 24 hour  Intake                0 ml  Output                1 ml  Net               -1 ml   Filed Weights   08/19/16 0559 08/20/16 0552 08/21/16 0500  Weight: 154 lb 12.2 oz (70.2 kg) 155 lb 6.8 oz (70.5 kg) 154 lb 12.2 oz (70.2 kg)    Physical Exam   Looks tired, chronically ill GEN: Well nourished, well developed, in no acute distress.  HEENT: Grossly normal.  Neck: Supple, no JVD, carotid bruits, or masses. Cardiac: RRR, no  rubs, or gallops. 2/6 systolic murmur and 2/4 holodiastolic murmur along left sternal border. No clubbing, cyanosis, edema.  Radials/DP/PT 2+ and equal  bilaterally.  Respiratory:  Diminished breath sounds in both bases, otherwise respirations regular and unlabored, clear to auscultation bilaterally. GI: Soft, nontender, nondistended, BS + x 4. MS: no deformity or atrophy. Skin: warm and dry, no rash. Neuro:  Strength and sensation are intact. Psych: AAOx3.  Normal affect.  Labs    CBC No results for input(s): WBC, NEUTROABS, HGB, HCT, MCV, PLT in the last 72 hours. Basic Metabolic Panel  Recent Labs  08/19/16 0835 08/20/16 0357  NA 136 134*  K 4.4 3.6  CL 96* 94*  CO2 32 32  GLUCOSE 112* 145*  BUN 13 12  CREATININE 1.18* 0.94  CALCIUM 8.8* 8.7*     Telemetry    SR with frequent PACs - Personally Reviewed  ECG    08/16/16 NSR with nonspecific ST changes - Personally Reviewed  Radiology    Dg Chest Port 1 View  Result Date: 08/20/2016 CLINICAL DATA:  Acute respiratory distress EXAM: PORTABLE CHEST 1 VIEW COMPARISON:  CXR 08/17/2016 and CT 08/16/2016 FINDINGS: Heart size is normal.  Aortic atherosclerosis involving the arch. Small left pleural effusion. Calcified granuloma projects over the left posterior sixth rib confirmed by recent CT. No pneumonic consolidation. No overt pulmonary edema. Probable atelectasis at the left lung base. IMPRESSION: Small left effusion. Left lower lobe atelectasis. Calcified left upper lobe granuloma. Electronically Signed   By: Ashley Royalty M.D.   On: 08/20/2016 15:33    Cardiac Studies   Echo 08/17/16  - Left ventricle: The cavity size was normal. Wall thickness was   normal. Systolic function was normal. The estimated ejection   fraction was in the range of 55% to 60%. Although no diagnostic   regional wall motion abnormality was identified, this possibility   cannot be completely excluded on the basis of this study.   Features are consistent with a pseudonormal left ventricular   filling pattern, with concomitant abnormal relaxation and   increased filling pressure (grade 2 diastolic dysfunction). - Aortic valve: There was moderate regurgitation. - Mitral valve: Mildly to moderately calcified annulus. Mildly   calcified leaflets . The findings are consistent with moderate   stenosis. There was trivial regurgitation. Peak gradient (D): 10   mm Hg. Valve area by pressure half-time: 1.75 cm^2. - Left atrium: The atrium was mildly dilated. - Right ventricle: The cavity size was normal. Systolic function   was normal. - Tricuspid valve: Peak RV-RA gradient (S): 42 mm Hg. - Pulmonary arteries: PA peak pressure: 45 mm Hg (S). - Inferior vena cava: The vessel was normal in size. The   respirophasic diameter changes were in the normal range (= 50%),   consistent with normal central venous pressure.  Patient Profile     71 yo with acute respiratory insufficiency and CHF, on background of COPD, aortic regurgitation and mitral stenosis (both estimated to be moderate, but with suboptimal TTE images).   Assessment & Plan    Although some  improvement since admission, she is still not well. It remains challenging to distinguish the relative contribution of pulmonary and cardiac disease to her condition and we may be underestimating the severity of her valvular abnormalities.  Had rapid response episode (08/20/16) with CP and SOB. Felt like she was going to die she said.   Recommend TEE prior to discharge with anesthesia assistance.   If MS is dominant lesion, need to focus on slowing her heart rate down. If AI is dominant lesion, bradyardia could be deleterious.  Continue diuretics.  Signed,  Candee Furbish, MD  08/21/2016, 10:58 AM

## 2016-08-21 NOTE — Care Management Important Message (Signed)
Important Message  Patient Details  Name: Traci [name] MRN: [id number] Date of Birth: 03-Jan-1946   Medicare Important Message Given:  Yes    Nathen May 08/21/2016, 2:48 PM

## 2016-08-21 NOTE — Evaluation (Signed)
Occupational Therapy Evaluation Patient Details Name: Traci [name] MRN: [id number] DOB: 10/27/1945 Today's Date: 08/21/2016    History of Present Illness Patient is a 71 yo female admitted 08/15/16 with chest pain, SOB, and weakness.  Patient with acute respiratory failure with hypoxia due to acute CHF.   PMH:  COPD, tobacco use, HTN, resting tremors, anxiety, chronic pain, MI, mitral stenosis, anemia   Clinical Impression   Pt admitted with above and presents with decreased activity tolerance, instability in standing, and bilateral tremors (Lt > Rt) impacting independence with ADLs and functional mobility.  Pt will benefit from OT acutely to maximize independence with ADLs and IADLs while addressing energy conservation strategies and coordination and motor control of BUE.     Follow Up Recommendations  Supervision/Assistance - 24 hour;Home health OT    Equipment Recommendations  None recommended by OT (TBD)    Recommendations for Other Services       Precautions / Restrictions Precautions Precautions: Fall Precaution Comments: Multiple falls at home Restrictions Weight Bearing Restrictions: No      Mobility Bed Mobility Overal bed mobility: Needs Assistance Bed Mobility: Supine to Sit     Supine to sit: Supervision     General bed mobility comments: No physical assist needed.  Cues for pacing.    Transfers Overall transfer level: Needs assistance Equipment used: Rolling walker (2 wheeled) Transfers: Sit to/from Stand Sit to Stand: Min guard;Min assist         General transfer comment: Pt performed sit to stand from bed x2 cues for hand placement to and from seated surface,  required increased assist when R knee buckles.      Balance Overall balance assessment: Needs assistance;History of Falls   Sitting balance-Leahy Scale: Good       Standing balance-Leahy Scale: Poor                              ADL                                          General ADL Comments: Pt deferred due to recently finishing PT and reporting fatigue.  Pt reports able to complete grooming tasks, sometimes with difficulty fastening bra and does not tie shoes but wears slip ons     Vision Vision Assessment?: No apparent visual deficits          Pertinent Vitals/Pain Pain Assessment: No/denies pain     Hand Dominance Right   Extremity/Trunk Assessment Upper Extremity Assessment Upper Extremity Assessment: Generalized weakness (loose grasp but functional, bilateral tremors Lt > Rt)           Communication Communication Communication: No difficulties   Cognition Arousal/Alertness: Awake/alert Behavior During Therapy: WFL for tasks assessed/performed Overall Cognitive Status: Within Functional Limits for tasks assessed                                Home Living Family/patient expects to be discharged to:: Private residence Living Arrangements: Spouse/significant other Available Help at Discharge: Family;Available 24 hours/day (husband is available 24/7, 2 daughters live nearby) Type of Home: Mobile home Home Access: Stairs to enter Entrance Stairs-Number of Steps: 2 in front; 5 in back Entrance Stairs-Rails: Right;Left Home Layout: One level     Bathroom  Shower/Tub: Occupational psychologist: Standard     Home Equipment: Environmental consultant - 2 wheels;Walker - 4 wheels;Bedside commode;Shower seat;Wheelchair - manual          Prior Functioning/Environment Level of Independence: Independent with assistive device(s);Needs assistance  Gait / Transfers Assistance Needed: Uses rollator for gait ADL's / Homemaking Assistance Needed: Husband supervises bathing for safety, occasionally assists with hooking bra.  Husband cooks and does housekeeping.            OT Problem List: Decreased strength;Decreased activity tolerance;Decreased coordination;Cardiopulmonary status limiting activity;Impaired UE  functional use   OT Treatment/Interventions: Self-care/ADL training;Therapeutic exercise;Energy conservation;Therapeutic activities;Patient/family education    OT Goals(Current goals can be found in the care plan section) Acute Rehab OT Goals Patient Stated Goal: To get stronger OT Goal Formulation: With patient Time For Goal Achievement: 09/04/16 Potential to Achieve Goals: Good  OT Frequency: Min 2X/week    End of Session    Activity Tolerance: Patient limited by fatigue Patient left: in chair;with call bell/phone within reach   Time: [id number] OT Time Calculation (min): 9 min Charges:  OT General Charges $OT Visit: 1 Procedure OT Evaluation $OT Eval Moderate Complexity: 1 Procedure G-CodesSimonne Come, [id number] 08/21/2016, 3:39 PM

## 2016-08-21 NOTE — Care Management Note (Signed)
Case Management Note  Patient Details  Name: Traci [name] MRN: [id number] Date of Birth: July 24, 1946  Subjective/Objective:                 Spoke with patient at the bedside. Patient admitted with CHF, continues diuresis. Does NOT use nebulizer or oxygen at home prior to admission. Currently on supplemental O2.  Lives at home with spouse in [place]. Uses cane and walker at home. PT rec for [facility].    Action/Plan:  Follow for [facility] PT /RN. Patient states she does not have [place] preference, has never used HH. Watch for O2 needs.    Expected Discharge Date:  08/19/16               Expected Discharge Plan:  [place]  In-House Referral:  NA  Discharge planning Services  CM Consult  Post Acute Care Choice:    Choice offered to:     DME Arranged:    DME Agency:     HH Arranged:    HH Agency:     Status of Service:  In process, will continue to follow  If discussed at Long Length of Stay Meetings, dates discussed:    Additional Comments:  Carles Collet, RN 08/21/2016, 1:15 PM

## 2016-08-21 NOTE — Progress Notes (Signed)
While rounding, RN asked if patient should be given 80g Lasix IV that was scheduled since patient had gotten doses earlier.  Earlier patient was experiencing CP and SOB, patient did have crackles.  I assessed that patient, patient was anxious but pleasant, lung sounds were clear and good air movement.  RN stated that SBP in the 100s and Lasix dose might make patient hypotensive.   I asked the RN to call the provider and update the provider and ask if lasix dose can be decreased.   RN will treat anxiety as needed. Patient was stable and I repositioned her.

## 2016-08-21 NOTE — Progress Notes (Addendum)
Initial Nutrition Assessment  DOCUMENTATION CODES:   Not applicable  INTERVENTION:   Ensure Enlive po BID, each supplement provides 350 kcal and 20 grams of protein  Vitamin D, ferrous sulfate, folic acid per MD  snacks  NUTRITION DIAGNOSIS:   Inadequate oral intake related to poor appetite, chronic illness as evidenced by severe depletion of muscle mass, per patient/family report.  GOAL:   Patient will meet greater than or equal to 90% of their needs  MONITOR:   PO intake, Supplement acceptance  REASON FOR ASSESSMENT:   Consult Assessment of nutrition requirement/status  ASSESSMENT:   71 year old female with COPD, ongoing tobacco use, hypertension, resting tremors, anxiety, chronic pain and remote history of MI presented with progressive shortness of breath and right-sided chest discomfort for almost 1 week. Patient reports dyspnea on minimal exertion associated with nonproductive cough. She also has orthopnea but no PND. Denies any fevers or chills nausea, vomiting, chest trauma, recent travel, abdominal pain, bowel urinary symptoms.  Met with pt in room today. Pt reports fair appetite pta but reports that her appetite has been declining for 6-7 years. Pt is currently eating 50% meals. Per chart, pt is wt stable. Pt with severe muscle wasting. Will order supplements and snacks per pt request.   Medications reviewed and include: vit D, celexa, lovenox, ferrous sulfate, folic acid, lasix, synthroid, protonix, KCl, prednisone   Labs reviewed: Na 134(L), Cl 94(L), Ca 8.7(L), vit B12 2579(H), folate 3.6(L)  Nutrition-Focused physical exam completed. Findings are no fat depletion, severe muscle depletion, and no edema.   Diet Order:  Diet Heart Room service appropriate? Yes; Fluid consistency: Thin  Skin:  Reviewed, no issues  Last BM:  12/31  Height:   Ht Readings from Last 1 Encounters:  08/18/16 _0  (1.753 m)    Weight:   Wt Readings from Last 1 Encounters:   08/21/16 154 lb 12.2 oz (70.2 kg)    Ideal Body Weight:  65.9 kg  BMI:  Body mass index is 22.85 kg/m.  Estimated Nutritional Needs:   Kcal:  2000-2300kcal/day   Protein:  84-98g/day   Fluid:  2L/day   EDUCATION NEEDS:   Education needs no appropriate at this time  Koleen Distance, RD, [place] Pager #[phone]

## 2016-08-21 NOTE — Progress Notes (Signed)
Physical Therapy Treatment Patient Details Name: Traci [name] MRN: [id number] DOB: 1946-05-10 Today's Date: 08/21/2016    History of Present Illness Patient is a 71 yo female admitted 08/15/16 with chest pain, SOB, and weakness.  Patient with acute respiratory failure with hypoxia due to acute CHF.   PMH:  COPD, tobacco use, HTN, resting tremors, anxiety, chronic pain, MI, mitral stenosis, anemia    PT Comments    Pt performed increased mobility, gait trials x2 on 4L with O2 sats 94%-99%.  Pt remains to buckle with gait and required min assist to correct LOB.  Pt will require significant assist at home from husband to reduce her risk of falling.    Follow Up Recommendations  Home health PT;Supervision for mobility/OOB     Equipment Recommendations  None recommended by PT    Recommendations for Other Services       Precautions / Restrictions Precautions Precautions: Fall Precaution Comments: Mult falls at home Restrictions Weight Bearing Restrictions: No    Mobility  Bed Mobility Overal bed mobility: Needs Assistance Bed Mobility: Supine to Sit     Supine to sit: Supervision     General bed mobility comments: No physical assist needed.  Cues for pacing.    Transfers Overall transfer level: Needs assistance Equipment used: Rolling walker (2 wheeled) Transfers: Sit to/from Stand Sit to Stand: Min guard;Min assist         General transfer comment: Pt performed sit to stand from bed x2 cues for hand placement to and from seated surface,  required increased assist when R knee buckles.    Ambulation/Gait Ambulation/Gait assistance: Min assist Ambulation Distance (Feet): 80 Feet (x2 trials.  ) Assistive device: Rolling walker (2 wheeled) Gait Pattern/deviations: Step-through pattern;Decreased stride length;Decreased weight shift to right;Trunk flexed Gait velocity: decreased Gait velocity interpretation: Below normal speed for age/gender General Gait Details: Pt  stood at Bedside with RW, PTA adjusted RW height to improve posture, buckling remains during gait R>L.  Pt required cues for R heel strike to improve overall knee extension in stance phase.  Pt required cues for RW safety and pacing, DOE noted on 3L.  Increased O2 to 4L and DOE improved O2 sats 94%-99%.     Stairs            Wheelchair Mobility    Modified Rankin (Stroke Patients Only)       Balance Overall balance assessment: Needs assistance;History of Falls   Sitting balance-Leahy Scale: Good       Standing balance-Leahy Scale: Poor                      Cognition Arousal/Alertness: Awake/alert Behavior During Therapy: WFL for tasks assessed/performed Overall Cognitive Status: Within Functional Limits for tasks assessed                      Exercises      General Comments        Pertinent Vitals/Pain Pain Assessment: No/denies pain    Home Living                      Prior Function            PT Goals (current goals can now be found in the care plan section) Acute Rehab PT Goals Patient Stated Goal: To get stronger Potential to Achieve Goals: Good Progress towards PT goals: Progressing toward goals    Frequency    Min 3X/week  PT Plan Current plan remains appropriate    Co-evaluation             End of Session Equipment Utilized During Treatment: Gait belt;Oxygen Activity Tolerance: Patient limited by fatigue Patient left: with call bell/phone within reach;with family/visitor present;in chair     Time: [id number] PT Time Calculation (min) (ACUTE ONLY): 35 min  Charges:  $Gait Training: 8-22 mins $Therapeutic Activity: 8-22 mins                    G Codes:      Cristela Blue Aug 25, 2016, 3:14 PM  Governor Rooks, PTA pager [phone]

## 2016-08-22 ENCOUNTER — Encounter (HOSPITAL_COMMUNITY)
Admission: EM | Disposition: A | Discharge status: Home Health | Payer: Self-pay | Source: Home / Self Care | Attending: Internal Medicine

## 2016-08-22 ENCOUNTER — Inpatient Hospital Stay (HOSPITAL_COMMUNITY): Payer: Medicare Other

## 2016-08-22 ENCOUNTER — Encounter (HOSPITAL_COMMUNITY): Payer: Self-pay

## 2016-08-22 DIAGNOSIS — I340 Nonrheumatic mitral (valve) insufficiency: Secondary | ICD-10-CM

## 2016-08-22 DIAGNOSIS — I080 Rheumatic disorders of both mitral and aortic valves: Secondary | ICD-10-CM

## 2016-08-22 DIAGNOSIS — I351 Nonrheumatic aortic (valve) insufficiency: Secondary | ICD-10-CM

## 2016-08-22 HISTORY — PX: TEE WITHOUT CARDIOVERSION: SHX5443

## 2016-08-22 LAB — CBC
HCT: 31.1 % — ABNORMAL LOW (ref 36.0–46.0)
HEMOGLOBIN: 9.8 g/dL — AB (ref 12.0–15.0)
MCH: 28.5 pg (ref 26.0–34.0)
MCHC: 31.5 g/dL (ref 30.0–36.0)
MCV: 90.4 fL (ref 78.0–100.0)
Platelets: 241 10*3/uL (ref 150–400)
RBC: 3.44 MIL/uL — AB (ref 3.87–5.11)
RDW: 14.2 % (ref 11.5–15.5)
WBC: 6.9 10*3/uL (ref 4.0–10.5)

## 2016-08-22 LAB — BASIC METABOLIC PANEL
ANION GAP: 5 (ref 5–15)
ANION GAP: 6 (ref 5–15)
BUN: 10 mg/dL (ref 6–20)
BUN: 9 mg/dL (ref 6–20)
CALCIUM: 8.9 mg/dL (ref 8.9–10.3)
CHLORIDE: 93 mmol/L — AB (ref 101–111)
CO2: 37 mmol/L — ABNORMAL HIGH (ref 22–32)
CO2: 36 mmol/L — AB (ref 22–32)
Calcium: 8.8 mg/dL — ABNORMAL LOW (ref 8.9–10.3)
Chloride: 94 mmol/L — ABNORMAL LOW (ref 101–111)
Creatinine, Ser: 1.05 mg/dL — ABNORMAL HIGH (ref 0.44–1.00)
Creatinine, Ser: 1.05 mg/dL — ABNORMAL HIGH (ref 0.44–1.00)
GFR calc non Af Amer: 52 mL/min — ABNORMAL LOW (ref >60)
GFR, EST AFRICAN AMERICAN: 60 mL/min — AB (ref >60)
GFR, EST AFRICAN AMERICAN: 60 mL/min — AB (ref >60)
GFR, EST NON AFRICAN AMERICAN: 52 mL/min — AB (ref >60)
Glucose, Bld: 86 mg/dL (ref 65–99)
Glucose, Bld: 88 mg/dL (ref 65–99)
POTASSIUM: 4.2 mmol/L (ref 3.5–5.1)
Potassium: 4.3 mmol/L (ref 3.5–5.1)
SODIUM: 135 mmol/L (ref 135–145)
SODIUM: 136 mmol/L (ref 135–145)

## 2016-08-22 LAB — PROTIME-INR
INR: 1.1
PROTHROMBIN TIME: 14.3 s (ref 11.4–15.2)

## 2016-08-22 SURGERY — ECHOCARDIOGRAM, TRANSESOPHAGEAL
Anesthesia: Moderate Sedation

## 2016-08-22 MED ORDER — MIDAZOLAM HCL 5 MG/ML IJ SOLN
INTRAMUSCULAR | Status: AC
  Filled 2016-08-22: qty 2

## 2016-08-22 MED ORDER — FENTANYL CITRATE (PF) 100 MCG/2ML IJ SOLN
INTRAMUSCULAR | Status: DC | PRN
  Administered 2016-08-22 (×2): 25 ug via INTRAVENOUS

## 2016-08-22 MED ORDER — BUTAMBEN-TETRACAINE-BENZOCAINE 2-2-14 % EX AERO
INHALATION_SPRAY | CUTANEOUS | Status: DC | PRN
  Administered 2016-08-22: 2 via TOPICAL

## 2016-08-22 MED ORDER — FENTANYL CITRATE (PF) 100 MCG/2ML IJ SOLN
INTRAMUSCULAR | Status: AC
  Filled 2016-08-22: qty 2

## 2016-08-22 MED ORDER — MIDAZOLAM HCL 10 MG/2ML IJ SOLN
INTRAMUSCULAR | Status: DC | PRN
  Administered 2016-08-22 (×2): 1 mg via INTRAVENOUS
  Administered 2016-08-22: 2 mg via INTRAVENOUS

## 2016-08-22 MED ORDER — SODIUM CHLORIDE 0.9 % IV SOLN
INTRAVENOUS | Status: DC
  Administered 2016-08-22: 500 mL via INTRAVENOUS

## 2016-08-22 NOTE — Op Note (Signed)
INDICATIONS: rheumatic heart disease  PROCEDURE:   Informed consent was obtained prior to the procedure. The risks, benefits and alternatives for the procedure were discussed and the patient comprehended these risks.  Risks include, but are not limited to, cough, sore throat, vomiting, nausea, somnolence, esophageal and stomach trauma or perforation, bleeding, low blood pressure, aspiration, pneumonia, infection, trauma to the teeth and death.    After a procedural time-out, the oropharynx was anesthetized with 20% benzocaine spray.   During this procedure the patient was administered a total of Versed 4 mg and Fentanyl 50 mg to achieve and maintain moderate conscious sedation.  The patient's heart rate, blood pressure, and oxygen saturationweare monitored continuously during the procedure. The period of conscious sedation was 20 minutes, of which I was present face-to-face 100% of this time.  The transesophageal probe was inserted in the esophagus and stomach without difficulty and multiple views were obtained.  The patient was kept under observation until the patient left the procedure room.  The patient left the procedure room in stable condition.   Agitated microbubble saline contrast was not administered.  COMPLICATIONS:    There were no immediate complications.  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 Moderate mitral stenosis. Moderate to severe mitral insufficiency. Moderate to severe aortic insufficiency, Normal LVEF.     Time Spent Directly with the Patient:  40 minutes   Jashley Yellin 08/22/2016, 11:57 AM

## 2016-08-22 NOTE — Progress Notes (Signed)
PT Cancellation Note  Patient Details Name: CRYSTALLEE MANCILLAS MRN: [id number] DOB: [birth year]   Cancelled Treatment:    Reason Eval/Treat Not Completed: Patient at procedure or test/unavailable (Pt off unit for TEE.  Will continue efforts if time permits.  )   Shaheim Mahar Eli Hose 08/22/2016, 12:05 PM  Governor Rooks, PTA pager [phone]

## 2016-08-22 NOTE — Progress Notes (Signed)
PROGRESS NOTE                                                                                                                                                                                                             Patient Demographics:    Traci [name], is a 71 y.o. female, DOB - [birth year], [id number]  Admit date - 08/15/2016   Admitting Physician Waldemar Dickens, MD  Outpatient Primary MD for the patient is Bonnita Nasuti, MD  LOS - 6  Outpatient Specialists: Pulmonary  Chief Complaint  Patient presents with  . Chest Pain  . Shortness of Breath       Brief Narrative   71 year old female with COPD, ongoing tobacco use, hypertension, resting tremors, anxiety, chronic pain and remote history of MI presented with progressive shortness of breath and right-sided chest discomfort for almost 1 week. Patient reports dyspnea on minimal exertion associated with nonproductive cough. She also has orthopnea but no PND. Denies any fevers or chills nausea, vomiting, chest trauma, recent travel, abdominal pain, bowel urinary symptoms. In the ED she had low-grade fever with chest x-ray showing small bilateral pleural effusion suggestive of interstitial edema. CT angiogram of the chest was negative for PE but showed moderate pleural effusion and mild pulmonary edema. Patient admitted for acute congestive heart failure.   Subjective:   Anxiety better after Xanax dose adjusted. Discussed TEE findings with patient and her daughters at bedside.   Assessment  & Plan :    Principal Problem:   Acute respiratory failure with hypoxia (HCC) Secondary to  acute diastolic CHF and associated moderate mitral stenosis, moderate to severe MR and TR on TEE.  Continue current IV Lasix dose. Monitor strict I/O and daily weight.   Active Problems: Acute diastolic CHF with Mild-to-moderate mitral stenosis/moderate to severe mitral  regurgitation and aortic regurgitation Minimizing rate controlling agents to avoid bradycardia that can worsen her symptoms. Cardiology recommends symptomatic management with diuretic. Not a candidate for surgery given her underlying lung disease.  COPD with mild acute bronchitis -Transitioned to oral prednisone, continue DuoNeb and Pulmicort. Weaning oxygen.  Continue bedside spirometry. Respiratory viral panel negative.  Counseled strongly on smoking cessation. Patient reports cutting down to 3-4 cigarettes. Supportive care with Tylenol and antitussives.     Chest pain Atypical likely musculoskeletal and associated with anxiety. Now  resolved.  Essential tremor On [place]. Was seeing neurologist in [place]. Daughters are concerned that patient has persistent tremors and has been forgetful along with few falls at home. They would like to get a second opinion and want to see a neurologist in [place]. Will make outpatient referral.  Iron deficiency anemia/folate deficiency Added iron supplement and folic acid. TSH and B12 normal   Generalized weakness and malnutrition Daughter reports patient uses a rollator but needs help from her husband. PT recommends home health with supervision. Nutrition consult added supplement.  Anxiety Added when necessary Xanax. Symptoms better today.     Code Status : Full code  Family Communication  : Daughter is at bedside  Disposition Plan  : Home with home health pending symptomatic improvement of her dyspnea. Possibly in the next 48-72 hours  Barriers For Discharge : Active symptoms  Consults  :  Cardiology  Procedures  :  CT angiogram of the chest 2-D echo TEE    DVT Prophylaxis  :  Lovenox - Lab Results  Component Value Date   PLT 241 08/22/2016    Antibiotics  :   Anti-infectives    None        Objective:   Vitals:   08/22/16 1225 08/22/16 1230 08/22/16 1235 08/22/16 1240  BP: (!) 92/37 (!) 84/36 (!) 93/38 (!)  98/37  Pulse: 83 85 88 87  Resp: 11 13 17 12   Temp:      TempSrc:      SpO2: 100% 98% 99% 100%  Weight:      Height:        Wt Readings from Last 3 Encounters:  08/22/16 69.9 kg (154 lb)  08/01/16 72.4 kg (159 lb 9.6 oz)  09/16/14 72.6 kg (160 lb)     Intake/Output Summary (Last 24 hours) at 08/22/16 1448 Last data filed at 08/22/16 1000  Gross per 24 hour  Intake                0 ml  Output             1300 ml  Net            -1300 ml     Physical Exam  Gen:. fatigued, not in distress HEENT:  moist mucosa, supple neck, Chest: Diminished breath sounds at lung bases, no crackles, no rhonchi or wheeze CVS: N S1&S2, no murmurs, rubs or gallop GI: soft, NT, ND, Musculoskeletal: warm, no edema, resting tremors     Data Review:    CBC  Recent Labs Lab 08/15/16 2245 08/22/16 0750  WBC 6.1 6.9  HGB 10.3* 9.8*  HCT 33.3* 31.1*  PLT 245 241  MCV 89.5 90.4  MCH 27.7 28.5  MCHC 30.9 31.5  RDW 14.6 14.2    Chemistries   Recent Labs Lab 08/18/16 0623 08/19/16 0835 08/20/16 0357 08/21/16 1302 08/22/16 0750  NA 136 136 134* 136 135  136  K 4.4 4.4 3.6 4.1 4.2  4.3  CL 97* 96* 94* 94* 93*  94*  CO2 31 32 32 29 37*  36*  GLUCOSE 138* 112* 145* 108* 88  86  BUN 11 13 12 8 10  9   CREATININE 0.99 1.18* 0.94 0.93 1.05*  1.05*  CALCIUM 8.8* 8.8* 8.7* 8.5* 8.8*  8.9   ------------------------------------------------------------------------------------------------------------------ No results for input(s): CHOL, HDL, LDLCALC, TRIG, CHOLHDL, LDLDIRECT in the last 72 hours.  Lab Results  Component Value Date   HGBA1C 5.3 03/28/2014   ------------------------------------------------------------------------------------------------------------------ No  results for input(s): TSH, T4TOTAL, T3FREE, THYROIDAB in the last 72 hours.  Invalid input(s):  FREET3 ------------------------------------------------------------------------------------------------------------------ No results for input(s): VITAMINB12, FOLATE, FERRITIN, TIBC, IRON, RETICCTPCT in the last 72 hours.  Coagulation profile  Recent Labs Lab 08/22/16 0750  INR 1.10    No results for input(s): DDIMER in the last 72 hours.  Cardiac Enzymes  Recent Labs Lab 08/16/16 0807 08/16/16 1318 08/16/16 2046  TROPONINI <0.03 <0.03 <0.03   ------------------------------------------------------------------------------------------------------------------    Component Value Date/Time   BNP 251.2 (H) 08/15/2016 2245    Inpatient Medications  Scheduled Meds: . budesonide (PULMICORT) nebulizer solution  0.25 mg Nebulization BID  . buPROPion  450 mg Oral Daily  . busPIRone  10 mg Oral BID  . cholecalciferol  2,000 Units Oral Daily  . citalopram  20 mg Oral Daily  . enoxaparin (LOVENOX) injection  40 mg Subcutaneous Q24H  . feeding supplement (ENSURE ENLIVE)  237 mL Oral BID BM  . ferrous sulfate  325 mg Oral BID WC  . folic acid  1 mg Oral Daily  . furosemide  80 mg Intravenous BID  . guaiFENesin  600 mg Oral BID  . ipratropium-albuterol  3 mL Nebulization BID  . levothyroxine  25 mcg Oral QAC breakfast  . pantoprazole  40 mg Oral Daily  . potassium chloride  20 mEq Oral Daily  . predniSONE  40 mg Oral Q breakfast  . primidone  50 mg Oral QID  . sodium chloride flush  3 mL Intravenous Q12H  . traZODone  300 mg Oral QHS   Continuous Infusions: PRN Meds:.sodium chloride, acetaminophen, albuterol, ALPRAZolam, guaiFENesin-dextromethorphan, magnesium hydroxide, nitroGLYCERIN, ondansetron (ZOFRAN) IV, oxyCODONE, sodium chloride flush  Micro Results Recent Results (from the past 240 hour(s))  Respiratory Panel by PCR     Status: None   Collection Time: 08/16/16  8:30 AM  Result Value Ref Range Status   Adenovirus NOT DETECTED NOT DETECTED Final   Coronavirus 229E  NOT DETECTED NOT DETECTED Final   Coronavirus HKU1 NOT DETECTED NOT DETECTED Final   Coronavirus NL63 NOT DETECTED NOT DETECTED Final   Coronavirus OC43 NOT DETECTED NOT DETECTED Final   Metapneumovirus NOT DETECTED NOT DETECTED Final   Rhinovirus / Enterovirus NOT DETECTED NOT DETECTED Final   Influenza A NOT DETECTED NOT DETECTED Final   Influenza B NOT DETECTED NOT DETECTED Final   Parainfluenza Virus 1 NOT DETECTED NOT DETECTED Final   Parainfluenza Virus 2 NOT DETECTED NOT DETECTED Final   Parainfluenza Virus 3 NOT DETECTED NOT DETECTED Final   Parainfluenza Virus 4 NOT DETECTED NOT DETECTED Final   Respiratory Syncytial Virus NOT DETECTED NOT DETECTED Final   Bordetella pertussis NOT DETECTED NOT DETECTED Final   Chlamydophila pneumoniae NOT DETECTED NOT DETECTED Final   Mycoplasma pneumoniae NOT DETECTED NOT DETECTED Final    Radiology Reports Dg Chest 2 View  Result Date: 08/17/2016 CLINICAL DATA:  CHF EXAM: CHEST  2 VIEW COMPARISON:  08/15/2016 FINDINGS: Moderate left pleural effusion. Small right pleural effusion. Left lower lobe atelectasis. Heart is normal size. No acute bony abnormality. IMPRESSION: Bilateral pleural effusions, left greater than right. Left lower lobe atelectasis. Electronically Signed   By: Rolm Baptise M.D.   On: 08/17/2016 08:12   Dg Chest 2 View  Result Date: 08/15/2016 CLINICAL DATA:  Subacute onset of shortness of breath and chronic cough. Initial encounter. EXAM: CHEST  2 VIEW COMPARISON:  Chest radiograph performed 08/01/2016 FINDINGS: Small bilateral pleural effusions are seen, left greater than right.  Vascular congestion is seen. Increased interstitial markings may reflect mild interstitial edema. The lungs are well-aerated. There is no evidence of pneumothorax. The heart is normal in size; the mediastinal contour is within normal limits. No acute osseous abnormalities are seen. IMPRESSION: Small bilateral pleural effusions, left greater than right.  Vascular congestion noted. Increased interstitial markings may reflect mild interstitial edema. Electronically Signed   By: Garald Balding M.D.   On: 08/15/2016 23:20   Dg Chest 2 View  Result Date: 08/01/2016 CLINICAL DATA:  Worsening shortness of breath over the past year. History of COPD, hypertension, current smoker. EXAM: CHEST  2 VIEW COMPARISON:  Portable chest x-ray of April 01, 2014 FINDINGS: The right lung is well-expanded and clear. On the left there is volume loss consistent with a small pleural effusion. The heart and pulmonary vascularity are normal. There is a calcification in the periphery of the left lung that is stable. The heart and pulmonary vascularity are normal. There calcification in the wall of the aortic arch. There there is soft tissue prominence in the left hilar region. The bony thorax exhibits no acute abnormality. IMPRESSION: New small left pleural effusion. Mild soft tissue prominence in the left hilum may reflect lymphadenopathy. Stable changes of COPD and previous granulomatous infection. Chest CT scanning is recommended to exclude occult malignancy. Thoracic aortic atherosclerosis. These results will be called to the ordering clinician or representative by the Radiologist Assistant, and communication documented in the PACS or zVision Dashboard. Electronically Signed   By: David  Martinique M.D.   On: 08/01/2016 14:04   Ct Angio Chest Pe W Or Wo Contrast  Result Date: 08/16/2016 CLINICAL DATA:  Chest pain and shortness of breath. EXAM: CT ANGIOGRAPHY CHEST WITH CONTRAST TECHNIQUE: Multidetector CT imaging of the chest was performed using the standard protocol during bolus administration of intravenous contrast. Multiplanar CT image reconstructions and MIPs were obtained to evaluate the vascular anatomy. CONTRAST:  100 cc Isovue 370 IV COMPARISON:  Radiographs yesterday and 08/01/2016 FINDINGS: Cardiovascular: There are no filling defects within the pulmonary arteries to  suggest pulmonary embolus. Atherosclerosis of the thoracic aorta without aneurysm or evidence of dissection. Minimal contrast refluxing into the hepatic veins and IVC. There is jugular venous distention. No pericardial fluid. Scattered coronary artery calcifications. Mediastinum/Nodes: No mediastinal, hilar, or axillary adenopathy. The esophagus is decompressed. Questionable debris within the dependent trachea, motion artifact limits assessment. Lungs/Pleura: Moderate bilateral pleural effusions. Moderate emphysema. Peripheral opacity just distal to the small blood in the right upper lobe may be chronic scarring or focal airspace pneumonia. There is mild smooth septal thickening. Calcified granuloma in the left upper lobe. Compressive atelectasis adjacent to pleural effusions. Relatively severe bronchial thickening is most prominent in the lower lobes. Fluid distends the azygos fissure. Upper Abdomen: No acute abnormality. Musculoskeletal: There are no acute or suspicious osseous abnormalities. Scattered calcified intervertebral discs. Review of the MIP images confirms the above findings. IMPRESSION: 1. No pulmonary embolus. 2. Findings consistent with right heart/congestive heart failure. Moderate pleural effusions, mild pulmonary edema, jugular venous distention and contrast refluxing into the hepatic veins and IVC. 3. Emphysema. Focal opacity just distal to a small blood in the right upper lobe may be chronic scarring or pneumonia. 4. Bronchial thickening, advanced, is most prominent in the lower lobes, may be acute or chronic. 5. Thoracic aortic atherosclerosis.  Coronary artery calcifications. Electronically Signed   By: Jeb Levering M.D.   On: 08/16/2016 04:31   Dg Chest [facility] 1 View  Result  Date: 08/20/2016 CLINICAL DATA:  Acute respiratory distress EXAM: PORTABLE CHEST 1 VIEW COMPARISON:  CXR 08/17/2016 and CT 08/16/2016 FINDINGS: Heart size is normal. Aortic atherosclerosis involving the arch. Small  left pleural effusion. Calcified granuloma projects over the left posterior sixth rib confirmed by recent CT. No pneumonic consolidation. No overt pulmonary edema. Probable atelectasis at the left lung base. IMPRESSION: Small left effusion. Left lower lobe atelectasis. Calcified left upper lobe granuloma. Electronically Signed   By: Ashley Royalty M.D.   On: 08/20/2016 15:33    Time Spent in minutes  25   Louellen Molder M.D on 08/22/2016 at 2:48 PM  Between 7am to 7pm - Pager - [phone]  After 7pm go to [email or web address] - password [facility]  [facility] -  Office  [phone]

## 2016-08-22 NOTE — Progress Notes (Addendum)
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 Net diuresis 5.3L since admission, 6 lb lighter compared to declared admission weight, but really no weight change in last 4 days.  Felt acute SOB, lasix administered, rapid response. Had 10/10 stabbing CP right sided to back. Crackles noted in lungs at the time.   Inpatient Medications    Scheduled Meds: . budesonide (PULMICORT) nebulizer solution  0.25 mg Nebulization BID  . buPROPion  450 mg Oral Daily  . busPIRone  10 mg Oral BID  . cholecalciferol  2,000 Units Oral Daily  . citalopram  20 mg Oral Daily  . enoxaparin (LOVENOX) injection  40 mg Subcutaneous Q24H  . feeding supplement (ENSURE ENLIVE)  237 mL Oral BID BM  . ferrous sulfate  325 mg Oral BID WC  . folic acid  1 mg Oral Daily  . furosemide  80 mg Intravenous BID  . guaiFENesin  600 mg Oral BID  . ipratropium-albuterol  3 mL Nebulization BID  . levothyroxine  25 mcg Oral QAC breakfast  . pantoprazole  40 mg Oral Daily  . potassium chloride  20 mEq Oral Daily  . predniSONE  40 mg Oral Q breakfast  . primidone  50 mg Oral QID  . sodium chloride flush  3 mL Intravenous Q12H  . traZODone  300 mg Oral QHS   Continuous Infusions:  PRN Meds: sodium chloride,  acetaminophen, albuterol, ALPRAZolam, guaiFENesin-dextromethorphan, magnesium hydroxide, nitroGLYCERIN, ondansetron (ZOFRAN) IV, oxyCODONE, sodium chloride flush   Vital Signs    Vitals:   08/22/16 1225 08/22/16 1230 08/22/16 1235 08/22/16 1240  BP: (!) 92/37 (!) 84/36 (!) 93/38 (!) 98/37  Pulse: 83 85 88 87  Resp: 11 13 17 12   Temp:      TempSrc:      SpO2: 100% 98% 99% 100%  Weight:      Height:        Intake/Output Summary (Last 24 hours) at 08/22/16 1320 Last data filed at 08/22/16 1000  Gross per 24 hour  Intake                0 ml  Output             1300 ml  Net            -1300 ml   Filed Weights   08/21/16 0500 08/22/16 0609 08/22/16 1103  Weight: 154 lb 12.2 oz (70.2 kg) 154 lb 11.2 oz (70.2 kg) 154 lb (69.9 kg)    Physical Exam   Looks tired, chronically ill GEN: Well nourished, well developed, in no acute distress.  HEENT: Grossly normal.  Neck: Supple, no JVD, carotid bruits, or masses. Cardiac: RRR, no  rubs, or gallops. 2/6 systolic murmur  and 2/4 holodiastolic murmur along left sternal border. No clubbing, cyanosis, edema.  Radials/DP/PT 2+ and equal bilaterally.  Respiratory:  Diminished breath sounds in both bases, otherwise respirations regular and unlabored, clear to auscultation bilaterally. GI: Soft, nontender, nondistended, BS + x 4. MS: no deformity or atrophy. Skin: warm and dry, no rash. Neuro:  Strength and sensation are intact. Psych: AAOx3.  Normal affect.  Labs    CBC  Recent Labs  08/22/16 0750  WBC 6.9  HGB 9.8*  HCT 31.1*  MCV 90.4  PLT [id number]   Basic Metabolic Panel  Recent Labs  08/21/16 1302 08/22/16 0750  NA 136 135  136  K 4.1 4.2  4.3  CL 94* 93*  94*  CO2 29 37*  36*  GLUCOSE 108* 88  86  BUN 8 10  9   CREATININE 0.93 1.05*  1.05*  CALCIUM 8.5* 8.8*  8.9     Telemetry    SR with frequent PACs - Personally Reviewed  ECG    08/16/16 NSR with nonspecific ST changes - Personally Reviewed  Radiology      Dg Chest Port 1 View  Result Date: 08/20/2016 CLINICAL DATA:  Acute respiratory distress EXAM: PORTABLE CHEST 1 VIEW COMPARISON:  CXR 08/17/2016 and CT 08/16/2016 FINDINGS: Heart size is normal. Aortic atherosclerosis involving the arch. Small left pleural effusion. Calcified granuloma projects over the left posterior sixth rib confirmed by recent CT. No pneumonic consolidation. No overt pulmonary edema. Probable atelectasis at the left lung base. IMPRESSION: Small left effusion. Left lower lobe atelectasis. Calcified left upper lobe granuloma. Electronically Signed   By: Ashley Royalty M.D.   On: 08/20/2016 15:33    Cardiac Studies   Echo 08/17/16  - Left ventricle: The cavity size was normal. Wall thickness was   normal. Systolic function was normal. The estimated ejection   fraction was in the range of 55% to 60%. Although no diagnostic   regional wall motion abnormality was identified, this possibility   cannot be completely excluded on the basis of this study.   Features are consistent with a pseudonormal left ventricular   filling pattern, with concomitant abnormal relaxation and   increased filling pressure (grade 2 diastolic dysfunction). - Aortic valve: There was moderate regurgitation. - Mitral valve: Mildly to moderately calcified annulus. Mildly   calcified leaflets . The findings are consistent with moderate   stenosis. There was trivial regurgitation. Peak gradient (D): 10   mm Hg. Valve area by pressure half-time: 1.75 cm^2. - Left atrium: The atrium was mildly dilated. - Right ventricle: The cavity size was normal. Systolic function   was normal. - Tricuspid valve: Peak RV-RA gradient (S): 42 mm Hg. - Pulmonary arteries: PA peak pressure: 45 mm Hg (S). - Inferior vena cava: The vessel was normal in size. The   respirophasic diameter changes were in the normal range (= 50%),   consistent with normal central venous pressure.  Patient Profile     71 yo with acute  respiratory insufficiency and CHF, on background of COPD, aortic regurgitation and mitral stenosis (both estimated to be moderate, but with suboptimal TTE images).   Assessment & Plan    Aortic regurgitation  - TEE reviewed with Dr. Loletha Grayer. Aortic regurgitation moderate to severe  - We will try to avoid excessive bradycardia which can make this lesion worse.  Mitral stenosis  - Thankfully mild on TEE  Mitral regurgitation  - Moderate to severe  Overall, continue with medical management, diuretics helpful.  Unfortunately given her other comorbidities especially underlying lung disease, she would not be a surgical candidate.  Continue to optimize her pulmonary function.  Had rapid response episode (08/20/16) with CP and SOB. Felt like she was going to die she said.   Continue diuretics. Out 6.5 L. If blood pressure remained soft, may need to hold next dose.  Had lengthy discussion with family.   Signed, Candee Furbish, MD  08/22/2016, 1:20 PM

## 2016-08-22 NOTE — Progress Notes (Signed)
  Echocardiogram Echocardiogram Transesophageal has been performed.  Tresa Res 08/22/2016, 12:30 PM

## 2016-08-22 NOTE — Progress Notes (Signed)
Physical Therapy Treatment Patient Details Name: Traci [name] MRN: [id number] DOB: 1945-08-27 Today's Date: 08/22/2016    History of Present Illness Patient is a 71 yo female admitted 08/15/16 with chest pain, SOB, and weakness.  Patient with acute respiratory failure with hypoxia due to acute CHF.   PMH:  COPD, tobacco use, HTN, resting tremors, anxiety, chronic pain, MI, mitral stenosis, anemia    PT Comments    Pt performed increased gait and SPO2 maintained at 99% on 4L. Will continue to recommend HHPT and assist from family at d/c.    Follow Up Recommendations  Home health PT;Supervision for mobility/OOB     Equipment Recommendations  None recommended by PT    Recommendations for Other Services       Precautions / Restrictions Precautions Precautions: Fall Precaution Comments: Multiple falls at home Restrictions Weight Bearing Restrictions: No    Mobility  Bed Mobility Overal bed mobility: Needs Assistance Bed Mobility: Supine to Sit;Sit to Supine     Supine to sit: Supervision Sit to supine: Supervision   General bed mobility comments: No physical assist needed.  Cues for pacing.    Transfers Overall transfer level: Needs assistance Equipment used: Rolling walker (2 wheeled)   Sit to Stand: Min guard;Min assist         General transfer comment: Pt performed sit to stand with cues for hand placement to and from seated surface,  required increased assist when R knee buckles.    Ambulation/Gait Ambulation/Gait assistance: Min assist;Min guard Ambulation Distance (Feet): 160 Feet Assistive device: Rolling walker (2 wheeled) Gait Pattern/deviations: Step-through pattern;Decreased stride length;Decreased weight shift to right;Trunk flexed Gait velocity: decreased   General Gait Details: Pt remains to present with multiple LOB.  Pt continues to buckle due to tremoring.  Pt on 4L and maintained O2 sats at 99%.  Pt able to increased gait distance.      Stairs            Wheelchair Mobility    Modified Rankin (Stroke Patients Only)       Balance Overall balance assessment: Needs assistance;History of Falls Sitting-balance support: No upper extremity supported;Feet supported Sitting balance-Leahy Scale: Good     Standing balance support: Bilateral upper extremity supported Standing balance-Leahy Scale: Poor Standing balance comment: UE support to maintain balance.                    Cognition Arousal/Alertness: Awake/alert Behavior During Therapy: WFL for tasks assessed/performed Overall Cognitive Status: Within Functional Limits for tasks assessed                      Exercises      General Comments        Pertinent Vitals/Pain Pain Assessment: No/denies pain    Home Living                      Prior Function            PT Goals (current goals can now be found in the care plan section) Acute Rehab PT Goals Patient Stated Goal: To get stronger Potential to Achieve Goals: Good Progress towards PT goals: Progressing toward goals    Frequency    Min 3X/week      PT Plan Current plan remains appropriate    Co-evaluation             End of Session Equipment Utilized During Treatment: Gait belt;Oxygen Activity Tolerance: Patient limited  by fatigue Patient left: with call bell/phone within reach;with family/visitor present;in chair     Time: [id number] PT Time Calculation (min) (ACUTE ONLY): 41 min  Charges:  $Gait Training: 8-22 mins $Therapeutic Activity: 23-37 mins                    G Codes:      Cristela Blue September 12, 2016, 4:08 PM  Governor Rooks, PTA pager [phone]

## 2016-08-23 LAB — CREATININE, SERUM
Creatinine, Ser: 0.98 mg/dL (ref 0.44–1.00)
GFR calc Af Amer: >60 mL/min (ref >60)
GFR calc non Af Amer: 56 mL/min — ABNORMAL LOW (ref >60)

## 2016-08-23 MED ORDER — FUROSEMIDE 80 MG PO TABS
80.0000 mg | ORAL_TABLET | Freq: Every day | ORAL | Status: DC
  Administered 2016-08-23 – 2016-08-24 (×2): 80 mg via ORAL
  Filled 2016-08-23 (×2): qty 1

## 2016-08-23 NOTE — Progress Notes (Signed)
Patient Name: Traci [name] Date of Encounter: 08/23/2016  Primary Cardiologist: Former Dr. Mare Ferrari patient.   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 No chest pain currently.   Inpatient Medications    Scheduled Meds: . budesonide (PULMICORT) nebulizer solution  0.25 mg Nebulization BID  . buPROPion  450 mg Oral Daily  . busPIRone  10 mg Oral BID  . cholecalciferol  2,000 Units Oral Daily  . citalopram  20 mg Oral Daily  . enoxaparin (LOVENOX) injection  40 mg Subcutaneous Q24H  . feeding supplement (ENSURE ENLIVE)  237 mL Oral BID BM  . ferrous sulfate  325 mg Oral BID WC  . folic acid  1 mg Oral Daily  . furosemide  80 mg Intravenous BID  . guaiFENesin  600 mg Oral BID  . ipratropium-albuterol  3 mL Nebulization BID  . levothyroxine  25 mcg Oral QAC breakfast  . pantoprazole  40 mg Oral Daily  . potassium chloride  20 mEq Oral Daily  . predniSONE  40 mg Oral Q breakfast  . primidone  50 mg Oral QID  . sodium chloride flush  3 mL Intravenous Q12H  . traZODone  300 mg Oral QHS   Continuous Infusions:  PRN Meds: sodium chloride, acetaminophen, albuterol, ALPRAZolam, guaiFENesin-dextromethorphan, magnesium hydroxide, nitroGLYCERIN, ondansetron (ZOFRAN) IV, oxyCODONE, sodium chloride flush   Vital Signs    Vitals:   08/22/16 2323  08/23/16 0202 08/23/16 0632 08/23/16 1049  BP: (!) 81/23 (!) 104/35 (!) 110/52   Pulse: 78 93 65   Resp: 16  16   Temp: 99.1 F (37.3 C)  98.1 F (36.7 C)   TempSrc: Oral  Oral   SpO2: 100%  100% 99%  Weight:   154 lb 5.2 oz (70 kg)   Height:        Intake/Output Summary (Last 24 hours) at 08/23/16 1308 Last data filed at 08/22/16 2159  Gross per 24 hour  Intake                3 ml  Output             1000 ml  Net             -997 ml   Filed Weights   08/22/16 0609 08/22/16 1103 08/23/16 0632  Weight: 154 lb 11.2 oz (70.2 kg) 154 lb (69.9 kg) 154 lb 5.2 oz (70 kg)    Physical Exam   GEN: chronically ill appearing, tremor noted HEENT: Grossly normal.  Neck: Supple, no JVD, carotid bruits, or masses. Cardiac: RRR, +murmurs, rubs, or gallops. No clubbing, cyanosis, edema.  Radials/DP/PT 2+ and equal bilaterally.  Respiratory:  Diminished breath sounds at lung bases, no crackles, no rhonchi or wheeze GI: Soft, nontender, nondistended, BS + x 4. MS: no deformity or  atrophy. Skin: warm and dry, no rash. Neuro:  Strength and sensation are intact. Psych: AAOx3.  Normal affect.  Labs    CBC  Recent Labs  08/22/16 0750  WBC 6.9  HGB 9.8*  HCT 31.1*  MCV 90.4  PLT [id number]   Basic Metabolic Panel  Recent Labs  08/21/16 1302 08/22/16 0750 08/23/16 0525  NA 136 135  136  --   K 4.1 4.2  4.3  --   CL 94* 93*  94*  --   CO2 29 37*  36*  --   GLUCOSE 108* 88  86  --   BUN 8 10  9   --   CREATININE 0.93 1.05*  1.05* 0.98  CALCIUM 8.5* 8.8*  8.9  --     Telemetry    SR with frequent PACs  - Personally Reviewed  ECG    08/16/16 NSR with nonspecific ST changes - Personally Reviewed  Radiology    No results found.  Cardiac Studies   TTE 08/17/16 - Left ventricle: The cavity size was normal. Wall thickness was normal. Systolic function was normal. The estimated ejection fraction was in the range of 55% to 60%. Although no diagnostic regional  wall motion abnormality was identified, this possibility cannot be completely excluded on the basis of this study. Features are consistent with a pseudonormal left ventricular filling pattern, with concomitant abnormal relaxation and increased filling pressure (grade 2 diastolic dysfunction). - Aortic valve: There was moderate regurgitation. - Mitral valve: Mildly to moderately calcified annulus. Mildly calcified leaflets . The findings are consistent with moderate stenosis. There was trivial regurgitation. Peak gradient (D): 10 mm Hg. Valve area by pressure half-time: 1.75 cm^2. - Left atrium: The atrium was mildly dilated. - Right ventricle: The cavity size was normal. Systolic function was normal. - Tricuspid valve: Peak RV-RA gradient (S): 42 mm Hg. - Pulmonary arteries: PA peak pressure: 45 mm Hg (S). - Inferior vena cava: The vessel was normal in size. The respirophasic diameter changes were in the normal range (= 50%), consistent with normal central venous pressure.   TEE 08/23/15 LV EF: 60-65% Study Conclusions - Left ventricle: The cavity size was normal. Wall thickness was   normal. Systolic function was normal. The estimated ejection   fraction was in the range of 60% to 65%. Wall motion was normal;   there were no regional wall motion abnormalities. - Aortic valve: There was moderate to severe regurgitation. - Mitral valve: Moderate thickening, involving the leaflet margin   more than the base, with mild involvement of chords, consistent   with rheumatic disease. Mobility of the posterior leaflet was   moderately restricted. Diastolic leaflet doming was present.   Transvalvular velocity was increased more than expected. The   findings are consistent with moderate stenosis. There was   moderate to severe regurgitation directed centrally. Valve area   (PISA): 1.14 cm^2. - Left atrium: The atrium was dilated. No evidence of thrombus in   the atrial  cavity or appendage. No spontaneous echo contrast was   observed. - Right atrium: The atrium was dilated. - Atrial septum: No defect or patent foramen ovale was identified. - Tricuspid valve: No evidence of vegetation. - Pulmonic valve: No evidence of vegetation. No evidence of   vegetation. - Pulmonary arteries: Systolic pressure was mildly increased. PA   peak pressure: 39 mm Hg (S). Impressions: - There was no evidence of a vegetation.  Patient Profile     71 year old female with COPD, ongoing  tobacco use, hypertension, resting tremors, anxiety, chronic pain and remote history of MI presented with progressive shortness of breath and right-sided chest discomfort for x1 week. Found to have A/C diastolic CHF with mod-severe AR/MR.   Assessment & Plan    Acute diastolic heart failure: BNP 251 and CXR with mild pulmonary edema. Net neg 7.6L and weight down 6 lbs (160--> 154 weight has been at 154 for several days). Currently on IV lasix 80mg  BID. Creat and K stable. Still with dyspnea (worse than yesterday). Difficult to tell if pulmonary or cardiac in etiology. We could think about switching her to oral lasix today or tomorrow.   Aortic regurgitation: TEE reviewed with Dr. Loletha Grayer. Aortic regurgitation moderate to severe. We will try to avoid excessive bradycardia which can make this lesion worse.  Mitral stenosis: mild on TEE  Mitral regurgitation: moderate to severe by TEE. Not a surgical candidate given her other comorbidities especially underlying lung disease per Dr. Marlou Porch. Continue to optimize her pulmonary function.   Signed, Angelena Form, PA-C  08/23/2016, 1:08 PM   Personally seen and examined. Agree with above.  Poor overall conditioning.  Has diuresis of 7.5L Will stop IV lasix and change to PO 80mg  QD  Candee Furbish, MD

## 2016-08-23 NOTE — Progress Notes (Signed)
Occupational Therapy Treatment Patient Details Name: Traci [name] MRN: [id number] DOB: 08-15-1946 Today's Date: 08/23/2016    History of present illness Patient is a 71 yo female admitted 08/15/16 with chest pain, SOB, and weakness.  Patient with acute respiratory failure with hypoxia due to acute CHF.   PMH:  COPD, tobacco use, HTN, resting tremors, anxiety, chronic pain, MI, mitral stenosis, anemia   OT comments  Pt required min assist for functional mobility this session due to unsteadiness with bil knee buckling and tremors. Pt able to complete grooming tasks standing at the sink and LB dressing with min guard assist. Educated pt on energy conservation strategies and provided handout; pt verbalized understanding. D/c plan remains appropriate. Will continue to follow acutely.   Follow Up Recommendations  Supervision/Assistance - 24 hour;Home health OT    Equipment Recommendations  None recommended by OT    Recommendations for Other Services      Precautions / Restrictions Precautions Precautions: Fall Precaution Comments: Multiple falls at home Restrictions Weight Bearing Restrictions: No       Mobility Bed Mobility Overal bed mobility: Needs Assistance Bed Mobility: Supine to Sit     Supine to sit: Supervision;HOB elevated     General bed mobility comments: Supervision for safety; no physical assist required.   Transfers Overall transfer level: Needs assistance Equipment used: Rolling walker (2 wheeled) Transfers: Sit to/from Stand Sit to Stand: Min assist         General transfer comment: Min steadying assist for balance. Good hand placement and technique with RW.    Balance Overall balance assessment: Needs assistance Sitting-balance support: Feet supported;No upper extremity supported Sitting balance-Leahy Scale: Good     Standing balance support: No upper extremity supported;During functional activity Standing balance-Leahy Scale: Poor Standing  balance comment: Min assist for standing balance without UE support                   ADL Overall ADL's : Needs assistance/impaired     Grooming: Min guard;Standing;Wash/dry face;Oral care               Lower Body Dressing: Min guard Lower Body Dressing Details (indicate cue type and reason): Pt able to adjust socks sitting EOB Toilet Transfer: Minimal assistance;Ambulation;RW Toilet Transfer Details (indicate cue type and reason): Simulated by sit to stand from EOB with functional mobility in room.          Functional mobility during ADLs: Minimal assistance;Rolling walker General ADL Comments: Unsteadiness in standing with bil knee buckling throughout. SpO2 >94% on 2L O2 during activity. Educated pt on energy conservation strategies and provided handout; pt verbalized understanding.      Vision                     Perception     Praxis      Cognition   Behavior During Therapy: WFL for tasks assessed/performed Overall Cognitive Status: Within Functional Limits for tasks assessed                       Extremity/Trunk Assessment               Exercises     Shoulder Instructions       General Comments      Pertinent Vitals/ Pain       Pain Assessment: No/denies pain  Home Living  Prior Functioning/Environment              Frequency  Min 2X/week        Progress Toward Goals  OT Goals(current goals can now be found in the care plan section)  Progress towards OT goals: Progressing toward goals  Acute Rehab OT Goals Patient Stated Goal: To get stronger OT Goal Formulation: With patient  Plan Discharge plan remains appropriate    Co-evaluation                 End of Session Equipment Utilized During Treatment: Gait belt;Rolling walker;Oxygen   Activity Tolerance Patient tolerated treatment well   Patient Left in chair;with call bell/phone within  reach;with chair alarm set   Nurse Communication Mobility status;Other (comment) (SpO2 on 2L)        Time: [id number] OT Time Calculation (min): 26 min  Charges: OT General Charges $OT Visit: 1 Procedure OT Treatments $Self Care/Home Management : 23-37 mins  Binnie Kand M.S., OTR/L Pager: [phone]  08/23/2016, 11:58 AM

## 2016-08-23 NOTE — Progress Notes (Signed)
PROGRESS NOTE                                                                                                                                                                                                             Patient Demographics:    Traci [name], is a 71 y.o. female, DOB - 1946/08/09, [id number]  Admit date - 08/15/2016   Admitting Physician Waldemar Dickens, MD  Outpatient Primary MD for the patient is Bonnita Nasuti, MD  LOS - 7  Outpatient Specialists: Pulmonary  Chief Complaint  Patient presents with  . Chest Pain  . Shortness of Breath       Brief Narrative   71 year old female with COPD, ongoing tobacco use, hypertension, resting tremors, anxiety, chronic pain and remote history of MI presented with progressive shortness of breath and right-sided chest discomfort for almost 1 week.  chest x-ray showing small bilateral pleural effusion suggestive of interstitial edema. CT angiogram of the chest was negative for PE but showed moderate pleural effusion and mild pulmonary edema. Patient admitted for acute congestive heart failure.   Subjective:   Still with dyspnea, but improving   Assessment  & Plan :    Principal Problem:   Acute respiratory failure with hypoxia (HCC) -Secondary to  acute diastolic CHF/in setting of mod to severe valvular disease -TEE with moderate mitral stenosis, moderate to severe MR and TR on TEE.  -on IV lasix 80mg  q12 per cards -negative 7.5L, ? Cut down dose, defer to cards -weight unchanged -bmet daily -wean O2-on 4L now  Acute diastolic CHF with Mild-to-moderate mitral stenosis/moderate to severe mitral regurgitation and aortic regurgitation -per cards not a candidate for surgery given her underlying lung disease/Age etc  COPD with mild acute bronchitis -Transitioned to oral prednisone, continue DuoNeb and Pulmicort. Weaning oxygen. - Respiratory viral panel  negative. - Counseled on smoking cessation  Chest pain -Atypical likely musculoskeletal and associated with anxiety. Now resolved.  Essential tremor -On Mysoline. Was seeing neurologist in [place]. Daughters are concerned that patient has persistent tremors and has been forgetful along with few falls at home.  -needs outpatient referral to Neurology, family wants to see another Neurologist in [place]  Iron deficiency anemia/folate deficiency - iron supplement and folic acid. TSH and B12 normal  Generalized weakness and malnutrition - Daughter reports patient uses a  rollator but needs help from her husband. - PT recommends home health with supervision.  - Nutrition consulted added supplement.  Anxiety -PRN Xanax  Code Status : Full code Family Communication  : Daughter is at bedside Disposition Plan  : Home with home health in 1-2days  Consults  :  Cardiology  Procedures  :  CT angiogram of the chest 2-D echo TEE    DVT Prophylaxis  :  Lovenox - Lab Results  Component Value Date   PLT 241 08/22/2016    Antibiotics  :   Anti-infectives    None        Objective:   Vitals:   08/22/16 2323 08/23/16 0202 08/23/16 0632 08/23/16 1049  BP: (!) 81/23 (!) 104/35 (!) 110/52   Pulse: 78 93 65   Resp: 16  16   Temp: 99.1 F (37.3 C)  98.1 F (36.7 C)   TempSrc: Oral  Oral   SpO2: 100%  100% 99%  Weight:   70 kg (154 lb 5.2 oz)   Height:        Wt Readings from Last 3 Encounters:  08/23/16 70 kg (154 lb 5.2 oz)  08/01/16 72.4 kg (159 lb 9.6 oz)  09/16/14 72.6 kg (160 lb)     Intake/Output Summary (Last 24 hours) at 08/23/16 1210 Last data filed at 08/22/16 2159  Gross per 24 hour  Intake                3 ml  Output             1000 ml  Net             -997 ml     Physical Exam  Gen:. fatigued, not in distress HEENT:  moist mucosa, supple neck, Chest: Diminished breath sounds at lung bases, no crackles, no rhonchi or wheeze CVS: N S1&S2, no  murmurs, rubs or gallop GI: soft, NT, ND, Musculoskeletal: warm, no edema, resting tremors     Data Review:    CBC  Recent Labs Lab 08/22/16 0750  WBC 6.9  HGB 9.8*  HCT 31.1*  PLT 241  MCV 90.4  MCH 28.5  MCHC 31.5  RDW 14.2    Chemistries   Recent Labs Lab 08/18/16 0623 08/19/16 0835 08/20/16 0357 08/21/16 1302 08/22/16 0750 08/23/16 0525  NA 136 136 134* 136 135  136  --   K 4.4 4.4 3.6 4.1 4.2  4.3  --   CL 97* 96* 94* 94* 93*  94*  --   CO2 31 32 32 29 37*  36*  --   GLUCOSE 138* 112* 145* 108* 88  86  --   BUN 11 13 12 8 10  9   --   CREATININE 0.99 1.18* 0.94 0.93 1.05*  1.05* 0.98  CALCIUM 8.8* 8.8* 8.7* 8.5* 8.8*  8.9  --    ------------------------------------------------------------------------------------------------------------------ No results for input(s): CHOL, HDL, LDLCALC, TRIG, CHOLHDL, LDLDIRECT in the last 72 hours.  Lab Results  Component Value Date   HGBA1C 5.3 03/28/2014   ------------------------------------------------------------------------------------------------------------------ No results for input(s): TSH, T4TOTAL, T3FREE, THYROIDAB in the last 72 hours.  Invalid input(s): FREET3 ------------------------------------------------------------------------------------------------------------------ No results for input(s): VITAMINB12, FOLATE, FERRITIN, TIBC, IRON, RETICCTPCT in the last 72 hours.  Coagulation profile  Recent Labs Lab 08/22/16 0750  INR 1.10    No results for input(s): DDIMER in the last 72 hours.  Cardiac Enzymes  Recent Labs Lab 08/16/16 1318 08/16/16 2046  TROPONINI <0.03 <0.03   ------------------------------------------------------------------------------------------------------------------  Component Value Date/Time   BNP 251.2 (H) 08/15/2016 2245    Inpatient Medications  Scheduled Meds: . budesonide (PULMICORT) nebulizer solution  0.25 mg Nebulization BID  . buPROPion  450  mg Oral Daily  . busPIRone  10 mg Oral BID  . cholecalciferol  2,000 Units Oral Daily  . citalopram  20 mg Oral Daily  . enoxaparin (LOVENOX) injection  40 mg Subcutaneous Q24H  . feeding supplement (ENSURE ENLIVE)  237 mL Oral BID BM  . ferrous sulfate  325 mg Oral BID WC  . folic acid  1 mg Oral Daily  . furosemide  80 mg Intravenous BID  . guaiFENesin  600 mg Oral BID  . ipratropium-albuterol  3 mL Nebulization BID  . levothyroxine  25 mcg Oral QAC breakfast  . pantoprazole  40 mg Oral Daily  . potassium chloride  20 mEq Oral Daily  . predniSONE  40 mg Oral Q breakfast  . primidone  50 mg Oral QID  . sodium chloride flush  3 mL Intravenous Q12H  . traZODone  300 mg Oral QHS   Continuous Infusions: PRN Meds:.sodium chloride, acetaminophen, albuterol, ALPRAZolam, guaiFENesin-dextromethorphan, magnesium hydroxide, nitroGLYCERIN, ondansetron (ZOFRAN) IV, oxyCODONE, sodium chloride flush  Micro Results Recent Results (from the past 240 hour(s))  Respiratory Panel by PCR     Status: None   Collection Time: 08/16/16  8:30 AM  Result Value Ref Range Status   Adenovirus NOT DETECTED NOT DETECTED Final   Coronavirus 229E NOT DETECTED NOT DETECTED Final   Coronavirus HKU1 NOT DETECTED NOT DETECTED Final   Coronavirus NL63 NOT DETECTED NOT DETECTED Final   Coronavirus OC43 NOT DETECTED NOT DETECTED Final   Metapneumovirus NOT DETECTED NOT DETECTED Final   Rhinovirus / Enterovirus NOT DETECTED NOT DETECTED Final   Influenza A NOT DETECTED NOT DETECTED Final   Influenza B NOT DETECTED NOT DETECTED Final   Parainfluenza Virus 1 NOT DETECTED NOT DETECTED Final   Parainfluenza Virus 2 NOT DETECTED NOT DETECTED Final   Parainfluenza Virus 3 NOT DETECTED NOT DETECTED Final   Parainfluenza Virus 4 NOT DETECTED NOT DETECTED Final   Respiratory Syncytial Virus NOT DETECTED NOT DETECTED Final   Bordetella pertussis NOT DETECTED NOT DETECTED Final   Chlamydophila pneumoniae NOT DETECTED NOT  DETECTED Final   Mycoplasma pneumoniae NOT DETECTED NOT DETECTED Final    Radiology Reports Dg Chest 2 View  Result Date: 08/17/2016 CLINICAL DATA:  CHF EXAM: CHEST  2 VIEW COMPARISON:  08/15/2016 FINDINGS: Moderate left pleural effusion. Small right pleural effusion. Left lower lobe atelectasis. Heart is normal size. No acute bony abnormality. IMPRESSION: Bilateral pleural effusions, left greater than right. Left lower lobe atelectasis. Electronically Signed   By: Rolm Baptise M.D.   On: 08/17/2016 08:12   Dg Chest 2 View  Result Date: 08/15/2016 CLINICAL DATA:  Subacute onset of shortness of breath and chronic cough. Initial encounter. EXAM: CHEST  2 VIEW COMPARISON:  Chest radiograph performed 08/01/2016 FINDINGS: Small bilateral pleural effusions are seen, left greater than right. Vascular congestion is seen. Increased interstitial markings may reflect mild interstitial edema. The lungs are well-aerated. There is no evidence of pneumothorax. The heart is normal in size; the mediastinal contour is within normal limits. No acute osseous abnormalities are seen. IMPRESSION: Small bilateral pleural effusions, left greater than right. Vascular congestion noted. Increased interstitial markings may reflect mild interstitial edema. Electronically Signed   By: Garald Balding M.D.   On: 08/15/2016 23:20   Dg Chest 2 View  Result Date: 08/01/2016 CLINICAL DATA:  Worsening shortness of breath over the past year. History of COPD, hypertension, current smoker. EXAM: CHEST  2 VIEW COMPARISON:  Portable chest x-ray of April 01, 2014 FINDINGS: The right lung is well-expanded and clear. On the left there is volume loss consistent with a small pleural effusion. The heart and pulmonary vascularity are normal. There is a calcification in the periphery of the left lung that is stable. The heart and pulmonary vascularity are normal. There calcification in the wall of the aortic arch. There there is soft tissue  prominence in the left hilar region. The bony thorax exhibits no acute abnormality. IMPRESSION: New small left pleural effusion. Mild soft tissue prominence in the left hilum may reflect lymphadenopathy. Stable changes of COPD and previous granulomatous infection. Chest CT scanning is recommended to exclude occult malignancy. Thoracic aortic atherosclerosis. These results will be called to the ordering clinician or representative by the Radiologist Assistant, and communication documented in the PACS or zVision Dashboard. Electronically Signed   By: David  Martinique M.D.   On: 08/01/2016 14:04   Ct Angio Chest Pe W Or Wo Contrast  Result Date: 08/16/2016 CLINICAL DATA:  Chest pain and shortness of breath. EXAM: CT ANGIOGRAPHY CHEST WITH CONTRAST TECHNIQUE: Multidetector CT imaging of the chest was performed using the standard protocol during bolus administration of intravenous contrast. Multiplanar CT image reconstructions and MIPs were obtained to evaluate the vascular anatomy. CONTRAST:  100 cc Isovue 370 IV COMPARISON:  Radiographs yesterday and 08/01/2016 FINDINGS: Cardiovascular: There are no filling defects within the pulmonary arteries to suggest pulmonary embolus. Atherosclerosis of the thoracic aorta without aneurysm or evidence of dissection. Minimal contrast refluxing into the hepatic veins and IVC. There is jugular venous distention. No pericardial fluid. Scattered coronary artery calcifications. Mediastinum/Nodes: No mediastinal, hilar, or axillary adenopathy. The esophagus is decompressed. Questionable debris within the dependent trachea, motion artifact limits assessment. Lungs/Pleura: Moderate bilateral pleural effusions. Moderate emphysema. Peripheral opacity just distal to the small blood in the right upper lobe may be chronic scarring or focal airspace pneumonia. There is mild smooth septal thickening. Calcified granuloma in the left upper lobe. Compressive atelectasis adjacent to pleural  effusions. Relatively severe bronchial thickening is most prominent in the lower lobes. Fluid distends the azygos fissure. Upper Abdomen: No acute abnormality. Musculoskeletal: There are no acute or suspicious osseous abnormalities. Scattered calcified intervertebral discs. Review of the MIP images confirms the above findings. IMPRESSION: 1. No pulmonary embolus. 2. Findings consistent with right heart/congestive heart failure. Moderate pleural effusions, mild pulmonary edema, jugular venous distention and contrast refluxing into the hepatic veins and IVC. 3. Emphysema. Focal opacity just distal to a small blood in the right upper lobe may be chronic scarring or pneumonia. 4. Bronchial thickening, advanced, is most prominent in the lower lobes, may be acute or chronic. 5. Thoracic aortic atherosclerosis.  Coronary artery calcifications. Electronically Signed   By: Jeb Levering M.D.   On: 08/16/2016 04:31   Dg Chest Port 1 View  Result Date: 08/20/2016 CLINICAL DATA:  Acute respiratory distress EXAM: PORTABLE CHEST 1 VIEW COMPARISON:  CXR 08/17/2016 and CT 08/16/2016 FINDINGS: Heart size is normal. Aortic atherosclerosis involving the arch. Small left pleural effusion. Calcified granuloma projects over the left posterior sixth rib confirmed by recent CT. No pneumonic consolidation. No overt pulmonary edema. Probable atelectasis at the left lung base. IMPRESSION: Small left effusion. Left lower lobe atelectasis. Calcified left upper lobe granuloma. Electronically Signed   By: Ashley Royalty  M.D.   On: 08/20/2016 15:33    Time Spent in minutes  25   Tasfia Vasseur M.D on 08/23/2016 at 12:10 PM  Between 7am to 7pm - Pager - [phone] After 7pm go to [email or web address] - password [facility]  [facility] -  Office  [phone]

## 2016-08-24 LAB — GLUCOSE, CAPILLARY: GLUCOSE-CAPILLARY: 110 mg/dL — AB (ref 65–99)

## 2016-08-24 MED ORDER — FUROSEMIDE 40 MG PO TABS
40.0000 mg | ORAL_TABLET | Freq: Every day | ORAL | Status: DC
  Administered 2016-08-25: 40 mg via ORAL
  Filled 2016-08-24: qty 1

## 2016-08-24 NOTE — Progress Notes (Addendum)
Patient Name: Traci [name] Date of Encounter: 08/24/2016  Primary Cardiologist: Dr. 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 No chest pain currently.   Inpatient Medications    Scheduled Meds: . budesonide (PULMICORT) nebulizer solution  0.25 mg Nebulization BID  . buPROPion  450 mg Oral Daily  . busPIRone  10 mg Oral BID  . cholecalciferol  2,000 Units Oral Daily  . citalopram  20 mg Oral Daily  . enoxaparin (LOVENOX) injection  40 mg Subcutaneous Q24H  . feeding supplement (ENSURE ENLIVE)  237 mL Oral BID BM  . ferrous sulfate  325 mg Oral BID WC  . folic acid  1 mg Oral Daily  . furosemide  80 mg Oral Daily  . guaiFENesin  600 mg Oral BID  . ipratropium-albuterol  3 mL Nebulization BID  . levothyroxine  25 mcg Oral QAC breakfast  . pantoprazole  40 mg Oral Daily  . potassium chloride  20 mEq Oral Daily  . predniSONE  40 mg Oral Q breakfast  . primidone  50 mg Oral QID  . sodium chloride flush  3 mL Intravenous Q12H  . traZODone  300 mg Oral QHS   Continuous Infusions:  PRN Meds: sodium chloride, acetaminophen, albuterol, ALPRAZolam, guaiFENesin-dextromethorphan, magnesium hydroxide, nitroGLYCERIN, ondansetron (ZOFRAN) IV, oxyCODONE, sodium chloride flush   Vital Signs    Vitals:   08/24/16 0804 08/24/16 1300 08/24/16 1325 08/24/16  1326  BP: (!) 93/32 (!) 90/42 (!) 85/69 100/60  Pulse: 69 74    Resp:  16    Temp:  97.5 F (36.4 C)    TempSrc:  Oral    SpO2:  100%    Weight:      Height:        Intake/Output Summary (Last 24 hours) at 08/24/16 1356 Last data filed at 08/24/16 0631  Gross per 24 hour  Intake              240 ml  Output                0 ml  Net              240 ml   Filed Weights   08/22/16 1103 08/23/16 0632 08/24/16 0352  Weight: 154 lb (69.9 kg) 154 lb 5.2 oz (70 kg) 159 lb 8 oz (72.3 kg)    Physical Exam   GEN: chronically ill appearing, tremor noted HEENT: Grossly normal.  Neck: Supple, no JVD, carotid bruits, or masses. Cardiac: RRR, +murmurs, rubs, or gallops. No clubbing, cyanosis, edema.  Radials/DP/PT 2+ and equal bilaterally.  Respiratory:  Diminished breath sounds at lung bases, no crackles, no rhonchi or wheeze GI: Soft, nontender, nondistended, BS + x 4. MS: no deformity or atrophy. Skin: warm and dry, no rash. Neuro:  Strength and sensation are  intact. Psych: AAOx3.  Normal affect.  Labs    CBC  Recent Labs  08/22/16 0750  WBC 6.9  HGB 9.8*  HCT 31.1*  MCV 90.4  PLT [id number]   Basic Metabolic Panel  Recent Labs  08/22/16 0750 08/23/16 0525  NA 135  136  --   K 4.2  4.3  --   CL 93*  94*  --   CO2 37*  36*  --   GLUCOSE 88  86  --   BUN 10  9  --   CREATININE 1.05*  1.05* 0.98  CALCIUM 8.8*  8.9  --     Telemetry    SR with frequent PACs  - Personally Reviewed  ECG    08/16/16 NSR with nonspecific ST changes - Personally Reviewed  Radiology    No results found.  Cardiac Studies   TTE 08/17/16 - Left ventricle: The cavity size was normal. Wall thickness was normal. Systolic function was normal. The estimated ejection fraction was in the range of 55% to 60%. Although no diagnostic regional wall motion abnormality was identified, this possibility cannot be completely excluded on the basis of this study. Features are  consistent with a pseudonormal left ventricular filling pattern, with concomitant abnormal relaxation and increased filling pressure (grade 2 diastolic dysfunction). - Aortic valve: There was moderate regurgitation. - Mitral valve: Mildly to moderately calcified annulus. Mildly calcified leaflets . The findings are consistent with moderate stenosis. There was trivial regurgitation. Peak gradient (D): 10 mm Hg. Valve area by pressure half-time: 1.75 cm^2. - Left atrium: The atrium was mildly dilated. - Right ventricle: The cavity size was normal. Systolic function was normal. - Tricuspid valve: Peak RV-RA gradient (S): 42 mm Hg. - Pulmonary arteries: PA peak pressure: 45 mm Hg (S). - Inferior vena cava: The vessel was normal in size. The respirophasic diameter changes were in the normal range (= 50%), consistent with normal central venous pressure.   TEE 08/23/15 LV EF: 60-65% Study Conclusions - Left ventricle: The cavity size was normal. Wall thickness was   normal. Systolic function was normal. The estimated ejection   fraction was in the range of 60% to 65%. Wall motion was normal;   there were no regional wall motion abnormalities. - Aortic valve: There was moderate to severe regurgitation. - Mitral valve: Moderate thickening, involving the leaflet margin   more than the base, with mild involvement of chords, consistent   with rheumatic disease. Mobility of the posterior leaflet was   moderately restricted. Diastolic leaflet doming was present.   Transvalvular velocity was increased more than expected. The   findings are consistent with moderate stenosis. There was   moderate to severe regurgitation directed centrally. Valve area   (PISA): 1.14 cm^2. - Left atrium: The atrium was dilated. No evidence of thrombus in   the atrial cavity or appendage. No spontaneous echo contrast was   observed. - Right atrium: The atrium was dilated. - Atrial septum: No defect or  patent foramen ovale was identified. - Tricuspid valve: No evidence of vegetation. - Pulmonic valve: No evidence of vegetation. No evidence of   vegetation. - Pulmonary arteries: Systolic pressure was mildly increased. PA   peak pressure: 39 mm Hg (S). Impressions: - There was no evidence of a vegetation.  Patient Profile     71 year old female with COPD, ongoing tobacco use, hypertension, resting tremors, anxiety, chronic pain and remote history of MI presented with progressive shortness of breath and right-sided chest discomfort  for x1 week. Found to have A/C diastolic CHF with mod-severe AR/MR.   Assessment & Plan    Acute diastolic heart failure: BNP 251 and CXR with mild pulmonary edema. Net neg 7.6L and weight down 6 lbs (160--> 154 weight has been at 154 for several days). lasix 40mg  po QD. Creat and K stable. Difficult to tell if pulmonary or cardiac in etiology.  -out 7.3L. BP fairly soft today.   Aortic regurgitation: TEE reviewed with Dr. Loletha Grayer. Aortic regurgitation moderate to severe. We will try to avoid excessive bradycardia which can make this lesion worse.  Mitral stenosis: mild on TEE  Mitral regurgitation: moderate to severe by TEE. Not a surgical candidate given her other comorbidities especially underlying lung disease per Dr. Marlou Porch. Continue to optimize her pulmonary function.  No further recommendations. Will sign off.   Candee Furbish, MD

## 2016-08-24 NOTE — Care Management Important Message (Signed)
Important Message  Patient Details  Name: Traci [name] MRN: [id number] Date of Birth: [birth year]   Medicare Important Message Given:  Yes    Nathen May 08/24/2016, 1:49 PM

## 2016-08-24 NOTE — Progress Notes (Signed)
Physical Therapy Treatment Patient Details Name: Traci [name] MRN: [id number] DOB: 09/11/1945 Today's Date: 08/24/2016    History of Present Illness Patient is a 71 yo female admitted 08/15/16 with chest pain, SOB, and weakness.  Patient with acute respiratory failure with hypoxia due to acute CHF.   PMH:  COPD, tobacco use, HTN, resting tremors, anxiety, chronic pain, MI, mitral stenosis, anemia    PT Comments    Pt continues to have tremors and R LE buckeling intermittently during gait, causing pt to be a fall risk.  She does not want rehab and wants to go home with husband.  Recommend HHPT and A with mobility.  Follow Up Recommendations  Home health PT;Supervision for mobility/OOB     Equipment Recommendations  None recommended by PT    Recommendations for Other Services       Precautions / Restrictions Precautions Precautions: Fall Precaution Comments: Multiple falls at home Restrictions Weight Bearing Restrictions: No    Mobility  Bed Mobility Overal bed mobility: Needs Assistance Bed Mobility: Supine to Sit     Supine to sit: Supervision     General bed mobility comments: Supervision for safety; no physical assist required.   Transfers Overall transfer level: Needs assistance Equipment used: Rolling walker (2 wheeled) Transfers: Sit to/from Stand Sit to Stand: Min guard         General transfer comment: min/guard for safety with standing  Ambulation/Gait Ambulation/Gait assistance: Min assist;Min guard Ambulation Distance (Feet): 165 Feet Assistive device: Rolling walker (2 wheeled) Gait Pattern/deviations: Step-through pattern;Decreased stride length;Trunk flexed;Decreased weight shift to right Gait velocity: decreased   General Gait Details: Pt with buckeling at times of R LE and multiple LOB with MIN A for balance while ambulating on 2 L min.  O2 90% and HR 115.   Stairs            Wheelchair Mobility    Modified Rankin (Stroke  Patients Only)       Balance Overall balance assessment: Needs assistance Sitting-balance support: Feet supported;No upper extremity supported Sitting balance-Leahy Scale: Good     Standing balance support: Bilateral upper extremity supported Standing balance-Leahy Scale: Poor Standing balance comment: tremors and R LE buckeling at times                    Cognition Arousal/Alertness: Awake/alert Behavior During Therapy: WFL for tasks assessed/performed Overall Cognitive Status: Within Functional Limits for tasks assessed                      Exercises      General Comments General comments (skin integrity, edema, etc.): Discussed d/c plans with pt and she wants to return home with husband's support.      Pertinent Vitals/Pain Pain Assessment: No/denies pain    Home Living                      Prior Function            PT Goals (current goals can now be found in the care plan section) Acute Rehab PT Goals Patient Stated Goal: To get stronger PT Goal Formulation: With patient Time For Goal Achievement: 08/31/16 Potential to Achieve Goals: Fair Progress towards PT goals: Progressing toward goals    Frequency    Min 3X/week      PT Plan Current plan remains appropriate    Co-evaluation             End  of Session   Activity Tolerance: Patient tolerated treatment well;Patient limited by fatigue Patient left: with call bell/phone within reach;with chair alarm set;in chair;with nursing/sitter in room     Time: 1217-1233 PT Time Calculation (min) (ACUTE ONLY): 16 min  Charges:  $Gait Training: 8-22 mins                    G Codes:      Reigan Tolliver LUBECK 08/24/2016, 1:14 PM

## 2016-08-24 NOTE — Progress Notes (Signed)
PROGRESS NOTE                                                                                                                                                                                                             Patient Demographics:    Traci [name], is a 71 y.o. female, DOB - 1945-12-01, [id number]  Admit date - 08/15/2016   Admitting Physician Waldemar Dickens, MD  Outpatient Primary MD for the patient is Bonnita Nasuti, MD  LOS - 8  Outpatient Specialists: Pulmonary  Chief Complaint  Patient presents with  . Chest Pain  . Shortness of Breath       Brief Narrative   71 year old female with COPD, ongoing tobacco use, hypertension, resting tremors, anxiety, chronic pain and remote history of MI presented with progressive shortness of breath and right-sided chest discomfort for almost 1 week.  chest x-ray showing small bilateral pleural effusion suggestive of interstitial edema. CT angiogram of the chest was negative for PE but showed moderate pleural effusion and mild pulmonary edema. Patient admitted for acute congestive heart failure.   Subjective:   Still with dyspnea, but improving, Denies any cough or shortness of breath   Assessment  & Plan :    Principal Problem:   Acute respiratory failure with hypoxia (HCC) -Secondary to  acute diastolic CHF/in setting of mod to severe valvular disease -TEE with moderate mitral stenosis, moderate to severe MR and TR on TEE.  -Diuresis per cardiology, transitioned to by mouth Lasix today, dose has been decreased giving soft blood pressure -weight unchanged -bmet daily -wean O2-on 2L now  Acute diastolic CHF with Mild-to-moderate mitral stenosis/moderate to severe mitral regurgitation and aortic regurgitation -per cards not a candidate for surgery given her underlying lung disease/Age etc  COPD with mild acute bronchitis -Transitioned to oral prednisone,  continue DuoNeb and Pulmicort. Weaning oxygen. - Respiratory viral panel negative. - Counseled on smoking cessation  Chest pain -Atypical likely musculoskeletal and associated with anxiety. Now resolved.  Essential tremor -On Mysoline. Was seeing neurologist in [place]. Daughters are concerned that patient has persistent tremors and has been forgetful along with few falls at home.  -needs outpatient referral to Neurology, family wants to see another Neurologist in [place]  Iron deficiency anemia/folate deficiency - iron supplement and folic acid. TSH and B12 normal  Generalized weakness  and malnutrition - Daughter reports patient uses a rollator but needs help from her husband. - PT recommends home health with supervision.  - Nutrition consulted added supplement.  Anxiety -PRN Xanax  Code Status : Full code Family Communication  : none at bedside Disposition Plan  : Home with home health in 1-2days  Consults  :  Cardiology  Procedures  :  CT angiogram of the chest 2-D echo TEE    DVT Prophylaxis  :  Lovenox - Lab Results  Component Value Date   PLT 241 08/22/2016    Antibiotics  :   Anti-infectives    None        Objective:   Vitals:   08/24/16 0804 08/24/16 1300 08/24/16 1325 08/24/16 1326  BP: (!) 93/32 (!) 90/42 (!) 85/69 100/60  Pulse: 69 74    Resp:  16    Temp:  97.5 F (36.4 C)    TempSrc:  Oral    SpO2:  100%    Weight:      Height:        Wt Readings from Last 3 Encounters:  08/24/16 72.3 kg (159 lb 8 oz)  08/01/16 72.4 kg (159 lb 9.6 oz)  09/16/14 72.6 kg (160 lb)     Intake/Output Summary (Last 24 hours) at 08/24/16 1551 Last data filed at 08/24/16 0631  Gross per 24 hour  Intake              240 ml  Output                0 ml  Net              240 ml     Physical Exam  Gen:. fatigued, not in distress HEENT:  moist mucosa, supple neck, Chest: Diminished breath sounds at lung bases, no crackles, no rhonchi or  wheeze CVS: N S1&S2, no murmurs, rubs or gallop GI: soft, NT, ND, Musculoskeletal: warm, no edema, resting tremors     Data Review:    CBC  Recent Labs Lab 08/22/16 0750  WBC 6.9  HGB 9.8*  HCT 31.1*  PLT 241  MCV 90.4  MCH 28.5  MCHC 31.5  RDW 14.2    Chemistries   Recent Labs Lab 08/18/16 0623 08/19/16 0835 08/20/16 0357 08/21/16 1302 08/22/16 0750 08/23/16 0525  NA 136 136 134* 136 135  136  --   K 4.4 4.4 3.6 4.1 4.2  4.3  --   CL 97* 96* 94* 94* 93*  94*  --   CO2 31 32 32 29 37*  36*  --   GLUCOSE 138* 112* 145* 108* 88  86  --   BUN 11 13 12 8 10  9   --   CREATININE 0.99 1.18* 0.94 0.93 1.05*  1.05* 0.98  CALCIUM 8.8* 8.8* 8.7* 8.5* 8.8*  8.9  --    ------------------------------------------------------------------------------------------------------------------ No results for input(s): CHOL, HDL, LDLCALC, TRIG, CHOLHDL, LDLDIRECT in the last 72 hours.  Lab Results  Component Value Date   HGBA1C 5.3 03/28/2014   ------------------------------------------------------------------------------------------------------------------ No results for input(s): TSH, T4TOTAL, T3FREE, THYROIDAB in the last 72 hours.  Invalid input(s): FREET3 ------------------------------------------------------------------------------------------------------------------ No results for input(s): VITAMINB12, FOLATE, FERRITIN, TIBC, IRON, RETICCTPCT in the last 72 hours.  Coagulation profile  Recent Labs Lab 08/22/16 0750  INR 1.10    No results for input(s): DDIMER in the last 72 hours.  Cardiac Enzymes No results for input(s): CKMB, TROPONINI, MYOGLOBIN in the last 168  hours.  Invalid input(s): CK ------------------------------------------------------------------------------------------------------------------    Component Value Date/Time   BNP 251.2 (H) 08/15/2016 2245    Inpatient Medications  Scheduled Meds: . budesonide (PULMICORT) nebulizer  solution  0.25 mg Nebulization BID  . buPROPion  450 mg Oral Daily  . busPIRone  10 mg Oral BID  . cholecalciferol  2,000 Units Oral Daily  . citalopram  20 mg Oral Daily  . enoxaparin (LOVENOX) injection  40 mg Subcutaneous Q24H  . feeding supplement (ENSURE ENLIVE)  237 mL Oral BID BM  . ferrous sulfate  325 mg Oral BID WC  . folic acid  1 mg Oral Daily  . [START ON 08/25/2016] furosemide  40 mg Oral Daily  . guaiFENesin  600 mg Oral BID  . ipratropium-albuterol  3 mL Nebulization BID  . levothyroxine  25 mcg Oral QAC breakfast  . pantoprazole  40 mg Oral Daily  . potassium chloride  20 mEq Oral Daily  . predniSONE  40 mg Oral Q breakfast  . primidone  50 mg Oral QID  . sodium chloride flush  3 mL Intravenous Q12H  . traZODone  300 mg Oral QHS   Continuous Infusions: PRN Meds:.sodium chloride, acetaminophen, albuterol, ALPRAZolam, guaiFENesin-dextromethorphan, magnesium hydroxide, nitroGLYCERIN, ondansetron (ZOFRAN) IV, oxyCODONE, sodium chloride flush  Micro Results Recent Results (from the past 240 hour(s))  Respiratory Panel by PCR     Status: None   Collection Time: 08/16/16  8:30 AM  Result Value Ref Range Status   Adenovirus NOT DETECTED NOT DETECTED Final   Coronavirus 229E NOT DETECTED NOT DETECTED Final   Coronavirus HKU1 NOT DETECTED NOT DETECTED Final   Coronavirus NL63 NOT DETECTED NOT DETECTED Final   Coronavirus OC43 NOT DETECTED NOT DETECTED Final   Metapneumovirus NOT DETECTED NOT DETECTED Final   Rhinovirus / Enterovirus NOT DETECTED NOT DETECTED Final   Influenza A NOT DETECTED NOT DETECTED Final   Influenza B NOT DETECTED NOT DETECTED Final   Parainfluenza Virus 1 NOT DETECTED NOT DETECTED Final   Parainfluenza Virus 2 NOT DETECTED NOT DETECTED Final   Parainfluenza Virus 3 NOT DETECTED NOT DETECTED Final   Parainfluenza Virus 4 NOT DETECTED NOT DETECTED Final   Respiratory Syncytial Virus NOT DETECTED NOT DETECTED Final   Bordetella pertussis NOT DETECTED  NOT DETECTED Final   Chlamydophila pneumoniae NOT DETECTED NOT DETECTED Final   Mycoplasma pneumoniae NOT DETECTED NOT DETECTED Final    Radiology Reports Dg Chest 2 View  Result Date: 08/17/2016 CLINICAL DATA:  CHF EXAM: CHEST  2 VIEW COMPARISON:  08/15/2016 FINDINGS: Moderate left pleural effusion. Small right pleural effusion. Left lower lobe atelectasis. Heart is normal size. No acute bony abnormality. IMPRESSION: Bilateral pleural effusions, left greater than right. Left lower lobe atelectasis. Electronically Signed   By: Rolm Baptise M.D.   On: 08/17/2016 08:12   Dg Chest 2 View  Result Date: 08/15/2016 CLINICAL DATA:  Subacute onset of shortness of breath and chronic cough. Initial encounter. EXAM: CHEST  2 VIEW COMPARISON:  Chest radiograph performed 08/01/2016 FINDINGS: Small bilateral pleural effusions are seen, left greater than right. Vascular congestion is seen. Increased interstitial markings may reflect mild interstitial edema. The lungs are well-aerated. There is no evidence of pneumothorax. The heart is normal in size; the mediastinal contour is within normal limits. No acute osseous abnormalities are seen. IMPRESSION: Small bilateral pleural effusions, left greater than right. Vascular congestion noted. Increased interstitial markings may reflect mild interstitial edema. Electronically Signed   By: Garald Balding  M.D.   On: 08/15/2016 23:20   Dg Chest 2 View  Result Date: 08/01/2016 CLINICAL DATA:  Worsening shortness of breath over the past year. History of COPD, hypertension, current smoker. EXAM: CHEST  2 VIEW COMPARISON:  Portable chest x-ray of April 01, 2014 FINDINGS: The right lung is well-expanded and clear. On the left there is volume loss consistent with a small pleural effusion. The heart and pulmonary vascularity are normal. There is a calcification in the periphery of the left lung that is stable. The heart and pulmonary vascularity are normal. There calcification  in the wall of the aortic arch. There there is soft tissue prominence in the left hilar region. The bony thorax exhibits no acute abnormality. IMPRESSION: New small left pleural effusion. Mild soft tissue prominence in the left hilum may reflect lymphadenopathy. Stable changes of COPD and previous granulomatous infection. Chest CT scanning is recommended to exclude occult malignancy. Thoracic aortic atherosclerosis. These results will be called to the ordering clinician or representative by the Radiologist Assistant, and communication documented in the PACS or zVision Dashboard. Electronically Signed   By: David  Martinique M.D.   On: 08/01/2016 14:04   Ct Angio Chest Pe W Or Wo Contrast  Result Date: 08/16/2016 CLINICAL DATA:  Chest pain and shortness of breath. EXAM: CT ANGIOGRAPHY CHEST WITH CONTRAST TECHNIQUE: Multidetector CT imaging of the chest was performed using the standard protocol during bolus administration of intravenous contrast. Multiplanar CT image reconstructions and MIPs were obtained to evaluate the vascular anatomy. CONTRAST:  100 cc Isovue 370 IV COMPARISON:  Radiographs yesterday and 08/01/2016 FINDINGS: Cardiovascular: There are no filling defects within the pulmonary arteries to suggest pulmonary embolus. Atherosclerosis of the thoracic aorta without aneurysm or evidence of dissection. Minimal contrast refluxing into the hepatic veins and IVC. There is jugular venous distention. No pericardial fluid. Scattered coronary artery calcifications. Mediastinum/Nodes: No mediastinal, hilar, or axillary adenopathy. The esophagus is decompressed. Questionable debris within the dependent trachea, motion artifact limits assessment. Lungs/Pleura: Moderate bilateral pleural effusions. Moderate emphysema. Peripheral opacity just distal to the small blood in the right upper lobe may be chronic scarring or focal airspace pneumonia. There is mild smooth septal thickening. Calcified granuloma in the left  upper lobe. Compressive atelectasis adjacent to pleural effusions. Relatively severe bronchial thickening is most prominent in the lower lobes. Fluid distends the azygos fissure. Upper Abdomen: No acute abnormality. Musculoskeletal: There are no acute or suspicious osseous abnormalities. Scattered calcified intervertebral discs. Review of the MIP images confirms the above findings. IMPRESSION: 1. No pulmonary embolus. 2. Findings consistent with right heart/congestive heart failure. Moderate pleural effusions, mild pulmonary edema, jugular venous distention and contrast refluxing into the hepatic veins and IVC. 3. Emphysema. Focal opacity just distal to a small blood in the right upper lobe may be chronic scarring or pneumonia. 4. Bronchial thickening, advanced, is most prominent in the lower lobes, may be acute or chronic. 5. Thoracic aortic atherosclerosis.  Coronary artery calcifications. Electronically Signed   By: Jeb Levering M.D.   On: 08/16/2016 04:31   Dg Chest Port 1 View  Result Date: 08/20/2016 CLINICAL DATA:  Acute respiratory distress EXAM: PORTABLE CHEST 1 VIEW COMPARISON:  CXR 08/17/2016 and CT 08/16/2016 FINDINGS: Heart size is normal. Aortic atherosclerosis involving the arch. Small left pleural effusion. Calcified granuloma projects over the left posterior sixth rib confirmed by recent CT. No pneumonic consolidation. No overt pulmonary edema. Probable atelectasis at the left lung base. IMPRESSION: Small left effusion. Left lower lobe atelectasis.  Calcified left upper lobe granuloma. Electronically Signed   By: Ashley Royalty M.D.   On: 08/20/2016 15:33     Taiven Greenley M.D on 08/24/2016 at 3:51 PM  Between 7am to 7pm - Pager - [phone] After 7pm go to [email or web address] - password [facility]  [facility] -  Office  [phone]

## 2016-08-25 DIAGNOSIS — J209 Acute bronchitis, unspecified: Secondary | ICD-10-CM

## 2016-08-25 DIAGNOSIS — J440 Chronic obstructive pulmonary disease with acute lower respiratory infection: Secondary | ICD-10-CM

## 2016-08-25 LAB — GLUCOSE, CAPILLARY: Glucose-Capillary: 94 mg/dL (ref 65–99)

## 2016-08-25 MED ORDER — FERROUS SULFATE 325 (65 FE) MG PO TABS: 325.0000 mg | ORAL_TABLET | Freq: Two times a day (BID) | ORAL | 3 refills | Status: DC

## 2016-08-25 MED ORDER — POTASSIUM CHLORIDE CRYS ER 20 MEQ PO TBCR: 20.0000 meq | EXTENDED_RELEASE_TABLET | Freq: Every day | ORAL | 0 refills | Status: DC

## 2016-08-25 MED ORDER — FUROSEMIDE 40 MG PO TABS: 40.0000 mg | ORAL_TABLET | Freq: Every day | ORAL | 1 refills | Status: DC

## 2016-08-25 MED ORDER — IPRATROPIUM-ALBUTEROL 0.5-2.5 (3) MG/3ML IN SOLN: 3.0000 mL | Freq: Four times a day (QID) | RESPIRATORY_TRACT | 0 refills | Status: DC | PRN

## 2016-08-25 MED ORDER — GUAIFENESIN ER 600 MG PO TB12: 600.0000 mg | ORAL_TABLET | Freq: Two times a day (BID) | ORAL | 0 refills | Status: DC

## 2016-08-25 MED ORDER — FOLIC ACID 1 MG PO TABS: 1.0000 mg | ORAL_TABLET | Freq: Every day | ORAL | 0 refills | Status: AC

## 2016-08-25 MED ORDER — ENSURE ENLIVE PO LIQD: 237.0000 mL | Freq: Two times a day (BID) | ORAL | 12 refills | Status: DC

## 2016-08-25 MED ORDER — PREDNISONE 10 MG PO TABS: ORAL_TABLET | ORAL | 0 refills | Status: DC

## 2016-08-25 NOTE — Progress Notes (Signed)
Pt given discharge instructions, prescriptions, and care notes. Pt verbalized understanding AEB no further questions or concerns at this time. IV was discontinued, no redness, pain, or swelling noted at this time. Telemetry discontinued and Centralized Telemetry was notified. Pt left the floor via wheelchair with staff in stable condition. 

## 2016-08-25 NOTE — Care Management Note (Signed)
Case Management Note  Patient Details  Name: Traci [name] MRN: [id number] Date of Birth: 04/11/1946  Subjective/Objective:                  Marland Kitchen Chest Pain  . Shortness of Breath   Action/Plan: Discharge planning Expected Discharge Date:  08/25/16               Expected Discharge Plan:  [place]  In-House Referral:     Discharge planning Services  CM Consult  Post Acute Care Choice:  Home Health Choice offered to:  Patient  DME Arranged:  Oxygen DME Agency:  [place] Arranged:  RN, PT [facility] Agency:  [place]  Status of Service:  Completed, signed off  If discussed at [place] of Stay Meetings, dates discussed:    Additional Comments: CM spoke with pt in room to offer choice and pt chooses [facility] to render HHPT/RN. Referral called to [facility] rep, Jermaine.  CM notified [place] DME rep, Reggie to please deliver the tank of O2 to room and to arrange for home O2.  No other CM needs were communicated. Dellie Catholic, RN 08/25/2016, 3:44 PM

## 2016-08-25 NOTE — Progress Notes (Signed)
SATURATION QUALIFICATIONS: (This note is used to comply with regulatory documentation for home oxygen)  Patient Saturations on Room Air at Rest = 98%  Patient Saturations on Room Air while Ambulating = 83%  Patient Saturations on 2 Liters of oxygen while Ambulating = 93%  Please briefly explain why patient needs home oxygen: Pt needs O2 when walking longer distances. O2 sats dropped below 88% after walking several feet, but quickly rose after 2L applied.

## 2016-08-25 NOTE — Discharge Summary (Signed)
Traci [name], is a 71 y.o. female  DOB August 13, 1946  MRN [id number].  Admission date:  08/15/2016  Admitting Physician  Waldemar Dickens, MD  Discharge Date:  08/25/2016   Primary MD  Bonnita Nasuti, MD  Recommendations for primary care physician for things to follow:  - please check CBC, BMP , repeat 2 view chest x-ray during next visit. - Patient to follow cardiology in 2 weeks.   Admission Diagnosis  CHF (congestive heart failure) (HCC) [I50.9] Acute congestive heart failure, unspecified congestive heart failure type ([place]) [I50.9]   Discharge Diagnosis  CHF (congestive heart failure) (HCC) [I50.9] Acute congestive heart failure, unspecified congestive heart failure type ([place]) [I50.9]    Principal Problem:   Acute respiratory failure with hypoxia (HCC) Active Problems:   HLD (hyperlipidemia)   Anxiety disorder   HTN (hypertension)   COPD with acute bronchitis (HCC)   Chest pain syndrome   CHF (congestive heart failure) (HCC)   Anemia   Chronic back pain   Hypothyroidism   Benign essential tremor   Acute congestive heart failure (HCC)   Acute diastolic heart failure (HCC)   Acute diastolic congestive heart failure (HCC)   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 Anginal pain ([place])   . Anxiety   . Arthritis   . Chronic bronchitis ([place])   . Chronic lower back pain   . COPD (chronic obstructive pulmonary disease) ([place])   . Depression   . Dyspepsia   . GERD (gastroesophageal reflux disease)   . H/O hiatal hernia   . XHBZJIRC(789.3)    "1-2/week" (08/16/2016)  . History of blood transfusion 1963   w/childbirth  . Hyperlipidemia   . Hypertension   . Hypothyroidism   . Shortness of breath 02/12/12   "all the time"  . Tremor     Past Surgical  History:  Procedure Laterality Date  . BACK SURGERY    . CARDIAC CATHETERIZATION  04/19/1995   EF 65-70%  . CATARACT EXTRACTION W/ INTRAOCULAR LENS  IMPLANT, BILATERAL  2012  . FRACTURE SURGERY    . LAPAROSCOPIC CHOLECYSTECTOMY  2010  . LEFT HEART CATHETERIZATION WITH CORONARY ANGIOGRAM N/A 02/13/2012   Procedure: LEFT HEART CATHETERIZATION WITH CORONARY ANGIOGRAM;  Surgeon: Thayer Headings, MD;  Location: [facility] CATH LAB;  Service: Cardiovascular;  Laterality: N/A;  . LUMBAR DISC SURGERY  early 2000's  . TEE WITHOUT CARDIOVERSION N/A 08/22/2016   Procedure: TRANSESOPHAGEAL ECHOCARDIOGRAM (TEE);  Surgeon: Sanda Klein, MD;  Location: [place];  Service: Cardiovascular;  Laterality: N/A;  . TIBIA IM NAIL INSERTION Right 03/30/2014   Procedure: INTRAMEDULLARY (IM) NAIL TIBIAL;  Surgeon: Wylene Simmer, MD;  Location: [place];  Service: Orthopedics;  Laterality: Right;  . US ECHOCARDIOGRAPHY  12/17/2007   EF 55-60%  . VAGINAL HYSTERECTOMY  1980's       History of present illness and  Hospital  Course:     Kindly see H&P for history of present illness and admission details, please review complete Labs, Consult reports and Test reports for all details in brief  HPI  from the history and physical done on the day of admission 08/16/2016  HPI: Traci [name] is a 71 y.o. female with medical history significant for COPD with ongoing tobacco abuse, hypertension, chronic resting tremor, anxiety disorder, chronic pain, dyslipidemia and remote history of MI. Patient reports symptoms of shortness of breath and right-sided chest discomfort radiating to the right arm for at least one week. Shortness of breath has been associated with wheezing and productive cough of clear and blood-tinged sputum. Chest pain has been waxing and waning in nature but only began after patient developed the respiratory symptoms. Prior to development of the breast with symptoms she had not been having any chest pain. Of note patient is  minimally ambulatory so unable to determine if having any exertional symptomatology. She has not had any fevers or chills. Because of her symptoms she has not smoked in about 4 days. She did receive a flu shot this year. Her room-air saturations in triage were 82%  ED Course:  Vital Signs: BP 112/56   Pulse 81   Temp 99.2 F (37.3 C) (Oral)   Resp 17   Ht 5' 8"  (1.727 m)   Wt 72.6 kg (160 lb)   SpO2 94%   BMI 24.33 kg/m  2 view chest x-ray: Small bilateral pleural effusions left greater than right with vascular congestion noted with increased interstitial markings possibly reflecteive of mild interstitial edema CTA chest/PE protocol: No pulmonary embolism, findings consistent with right-sided heart failure with moderate pleural effusions and mild pulmonary edema as well as JVD with contrast refluxing into the hepatic veins and IVC. Also findings of emphysema and bronchitis but unable to differentiate if acute versus chronic bronchitis Lab data: Sodium 138, potassium 4.1, BUN 6, creatinine 0.92, anion gap 9, BNP 252, poc troponin 0.00, troponin less than 0.03, Procalcitonin less than 0.10, Griesinger count 6100 differential not obtained, hemoglobin 10.3, platelets 245,000, ESR 35 Medications and treatments: Proventil neb 2, Solu-Medrol 125 mg IV 1, Lasix 40 mg IV 1, DuoNeb 1, nitroglycerin 2% ointment 1 inch 1, Tylenol 650 mg 1  Hospital Course  71 year old female with COPD, ongoing tobacco use, hypertension, resting tremors, anxiety, chronic pain and remote history of MI presented with progressive shortness of breath and right-sided chest discomfort for almost 1 week.  chest x-ray showing small bilateral pleural effusion suggestive of interstitial edema. CT angiogram of the chest was negative for PE but showed moderate pleural effusion and mild pulmonary edema. Patient admitted for acute congestive heart failure.  Acute respiratory failure with hypoxia (HCC) -Secondary to  acute diastolic  CHF/in setting of mod to severe valvular disease, as well with baseline COPD -TEE with moderate mitral stenosis, moderate to severe MR and TR on TEE.  -Diuresis per cardiology, transitioned to by mouth Lasix, currently on Lasix 40 mg oral daily given soft blood pressure, appears to be euvolemic on this weight. - Patient remains hypoxic on ablation, so we'll have case management to arrange for oxygen 2 L nasal cannula on discharge.  Acute diastolic CHF with Mild-to-moderate mitral stenosis/moderate to severe mitral regurgitation and aortic regurgitation -per cards not a candidate for surgery given her underlying lung disease/Age etc  COPD with mild acute bronchitis -Transitioned to oral prednisone, we'll discharge on prednisone taper - Respiratory viral panel negative. - Counseled on  smoking cessation  Chest pain -Atypical likely musculoskeletal and associated with anxiety. Now resolved.  Essential tremor -On Mysoline. Was seeing neurologist in [place].  Iron deficiency anemia/folate deficiency - iron supplement and folic acid. TSH and B12 normal  Generalized weakness and malnutrition - Daughter reports patient uses a rollator but needs help from her husband. - PT recommends home health with supervision.  - Nutrition consulted added supplement.  Anxiety -PRN Xanax   Discharge Condition:  stable   Follow UP  Follow-up Information    HAGUE, IMRAN P, MD Follow up in 1 week(s).   Specialty:  Internal Medicine Contact information: [address] [place]  [address] [phone]        Sanda Klein, MD Follow up in 2 week(s).   Specialty:  Cardiology Contact information: [address] [place] [address] [phone]             Discharge Instructions  and  Discharge Medications     Discharge Instructions    Ambulatory referral to Neurology    Complete by:  As directed    An appointment is requested in approximately: 4    Discharge instructions    Complete by:  As directed    Follow with Primary MD HAGUE, Rosalyn Charters, MD in 7 days   Get CBC, CMP, 2 view Chest X ray checked  by Primary MD next visit.    Activity: As tolerated with Full fall precautions use walker/cane & assistance as needed   Disposition Home    Diet: Heart Healthy , low-salt with fluid restriction 1500 mL per day , with feeding assistance and aspiration precautions.  For Heart failure patients - Check your Weight same time everyday, if you gain over 2 pounds, or you develop in leg swelling, experience more shortness of breath or chest pain, call your Primary MD immediately. Follow Cardiac Low Salt Diet and 1.5 lit/day fluid restriction.   On your next visit with your primary care physician please Get Medicines reviewed and adjusted.   Please request your Prim.MD to go over all Hospital Tests and Procedure/Radiological results at the follow up, please get all Hospital records sent to your Prim MD by signing hospital release before you go home.   If you experience worsening of your admission symptoms, develop shortness of breath, life threatening emergency, suicidal or homicidal thoughts you must seek medical attention immediately by calling 911 or calling your MD immediately  if symptoms less severe.  You Must read complete instructions/literature along with all the possible adverse reactions/side effects for all the Medicines you take and that have been prescribed to you. Take any new Medicines after you have completely understood and accpet all the possible adverse reactions/side effects.   Do not drive, operating heavy machinery, perform activities at heights, swimming or participation in water activities or provide baby sitting services if your were admitted for syncope or siezures until you have seen by Primary MD or a Neurologist and advised to do so again.  Do not drive when taking Pain medications.    Do not take more than  prescribed Pain, Sleep and Anxiety Medications  Special Instructions: If you have smoked or chewed Tobacco  in the last 2 yrs please stop smoking, stop any regular Alcohol  and or any Recreational drug use.  Wear Seat belts while driving.   Please note  You were cared for by a hospitalist during your hospital stay. If you have any questions about your discharge medications or the  care you received while you were in the hospital after you are discharged, you can call the unit and asked to speak with the hospitalist on call if the hospitalist that took care of you is not available. Once you are discharged, your primary care physician will handle any further medical issues. Please note that NO REFILLS for any discharge medications will be authorized once you are discharged, as it is imperative that you return to your primary care physician (or establish a relationship with a primary care physician if you do not have one) for your aftercare needs so that they can reassess your need for medications and monitor your lab values.   Increase activity slowly    Complete by:  As directed      Allergies as of 08/25/2016      Reactions   Cephalexin Hives   Cymbalta [duloxetine Hcl] Other (See Comments)   Sees spiders   Latex    " MAKES ME ITCHY "       Medication List    TAKE these medications   albuterol 108 (90 Base) MCG/ACT inhaler Commonly known as:  PROVENTIL HFA;VENTOLIN HFA Inhale 2 puffs into the lungs every 6 (six) hours as needed for wheezing or shortness of breath.   ALPRAZolam 1 MG tablet Commonly known as:  XANAX Take 1 mg by mouth 2 (two) times daily as needed for anxiety.   buPROPion 150 MG 24 hr tablet Commonly known as:  WELLBUTRIN XL Take 450 mg by mouth daily.   busPIRone 10 MG tablet Commonly known as:  BUSPAR Take 10 mg by mouth 2 (two) times daily.   citalopram 20 MG tablet Commonly known as:  CELEXA Take 20 mg by mouth daily.   feeding supplement (ENSURE ENLIVE)  Liqd Take 237 mLs by mouth 2 (two) times daily between meals.   ferrous sulfate 325 (65 FE) MG tablet Take 1 tablet (325 mg total) by mouth 2 (two) times daily with a meal.   folic acid 1 MG tablet Commonly known as:  FOLVITE Take 1 tablet (1 mg total) by mouth daily. Start taking on:  08/26/2016   furosemide 40 MG tablet Commonly known as:  LASIX Take 1 tablet (40 mg total) by mouth daily. Start taking on:  08/26/2016   guaiFENesin 600 MG 12 hr tablet Commonly known as:  MUCINEX Take 1 tablet (600 mg total) by mouth 2 (two) times daily.   Indacaterol-Glycopyrrolate 27.5-15.6 MCG Caps Commonly known as:  [place] 1 capsule into inhaler and inhale 2 (two) times daily.   levothyroxine 25 MCG tablet Commonly known as:  SYNTHROID, LEVOTHROID Take 25 mcg by mouth daily before breakfast.   omeprazole 20 MG capsule Commonly known as:  PRILOSEC Take 20 mg by mouth daily.   oxyCODONE 15 MG immediate release tablet Commonly known as:  ROXICODONE Take 15 mg by mouth every 6 (six) hours as needed for pain.   potassium chloride SA 20 MEQ tablet Commonly known as:  K-DUR,KLOR-CON Take 1 tablet (20 mEq total) by mouth daily. Start taking on:  08/26/2016   predniSONE 10 MG tablet Commonly known as:  DELTASONE Please take 40 mg oral daily for 2 days, then 30 mg oral daily for 2 days, then 20 mg oral daily for 2 days, then 10 mg oral daily for 2 days, then stop.   primidone 50 MG tablet Commonly known as:  MYSOLINE Take 2 tablets (100 mg total) by mouth 3 (three) times daily. What changed:  how much to take  when to take this   traZODone 150 MG tablet Commonly known as:  DESYREL Take 300 mg by mouth at bedtime.   Turmeric 500 MG Tabs Take 500 mg by mouth daily.   vitamin B-12 1000 MCG tablet Commonly known as:  CYANOCOBALAMIN Take 1,000 mcg by mouth daily.   Vitamin D3 2000 units Tabs Take 2,000 Units by mouth daily.            Durable Medical Equipment          Start     Ordered   08/25/16 1446  For home use only DME oxygen  Once    Comments:  Continuous 2 L of oxygen via nasal cannula  Question Answer Comment  Liters per Minute 2   Frequency Continuous (stationary and portable oxygen unit needed)   Oxygen conserving device Yes   Oxygen delivery system Gas      08/25/16 1446        Diet and Activity recommendation: See Discharge Instructions above   Consults obtained -  Cardiology   Major procedures and Radiology Reports - PLEASE review detailed and final reports for all details, in brief -      Dg Chest 2 View  Result Date: 08/17/2016 CLINICAL DATA:  CHF EXAM: CHEST  2 VIEW COMPARISON:  08/15/2016 FINDINGS: Moderate left pleural effusion. Small right pleural effusion. Left lower lobe atelectasis. Heart is normal size. No acute bony abnormality. IMPRESSION: Bilateral pleural effusions, left greater than right. Left lower lobe atelectasis. Electronically Signed   By: Rolm Baptise M.D.   On: 08/17/2016 08:12   Dg Chest 2 View  Result Date: 08/15/2016 CLINICAL DATA:  Subacute onset of shortness of breath and chronic cough. Initial encounter. EXAM: CHEST  2 VIEW COMPARISON:  Chest radiograph performed 08/01/2016 FINDINGS: Small bilateral pleural effusions are seen, left greater than right. Vascular congestion is seen. Increased interstitial markings may reflect mild interstitial edema. The lungs are well-aerated. There is no evidence of pneumothorax. The heart is normal in size; the mediastinal contour is within normal limits. No acute osseous abnormalities are seen. IMPRESSION: Small bilateral pleural effusions, left greater than right. Vascular congestion noted. Increased interstitial markings may reflect mild interstitial edema. Electronically Signed   By: Garald Balding M.D.   On: 08/15/2016 23:20   Dg Chest 2 View  Result Date: 08/01/2016 CLINICAL DATA:  Worsening shortness of breath over the past year. History of COPD,  hypertension, current smoker. EXAM: CHEST  2 VIEW COMPARISON:  Portable chest x-ray of April 01, 2014 FINDINGS: The right lung is well-expanded and clear. On the left there is volume loss consistent with a small pleural effusion. The heart and pulmonary vascularity are normal. There is a calcification in the periphery of the left lung that is stable. The heart and pulmonary vascularity are normal. There calcification in the wall of the aortic arch. There there is soft tissue prominence in the left hilar region. The bony thorax exhibits no acute abnormality. IMPRESSION: New small left pleural effusion. Mild soft tissue prominence in the left hilum may reflect lymphadenopathy. Stable changes of COPD and previous granulomatous infection. Chest CT scanning is recommended to exclude occult malignancy. Thoracic aortic atherosclerosis. These results will be called to the ordering clinician or representative by the Radiologist Assistant, and communication documented in the PACS or zVision Dashboard. Electronically Signed   By: David  Martinique M.D.   On: 08/01/2016 14:04   Ct Angio Chest Pe W Or Wo  Contrast  Result Date: 08/16/2016 CLINICAL DATA:  Chest pain and shortness of breath. EXAM: CT ANGIOGRAPHY CHEST WITH CONTRAST TECHNIQUE: Multidetector CT imaging of the chest was performed using the standard protocol during bolus administration of intravenous contrast. Multiplanar CT image reconstructions and MIPs were obtained to evaluate the vascular anatomy. CONTRAST:  100 cc Isovue 370 IV COMPARISON:  Radiographs yesterday and 08/01/2016 FINDINGS: Cardiovascular: There are no filling defects within the pulmonary arteries to suggest pulmonary embolus. Atherosclerosis of the thoracic aorta without aneurysm or evidence of dissection. Minimal contrast refluxing into the hepatic veins and IVC. There is jugular venous distention. No pericardial fluid. Scattered coronary artery calcifications. Mediastinum/Nodes: No mediastinal,  hilar, or axillary adenopathy. The esophagus is decompressed. Questionable debris within the dependent trachea, motion artifact limits assessment. Lungs/Pleura: Moderate bilateral pleural effusions. Moderate emphysema. Peripheral opacity just distal to the small blood in the right upper lobe may be chronic scarring or focal airspace pneumonia. There is mild smooth septal thickening. Calcified granuloma in the left upper lobe. Compressive atelectasis adjacent to pleural effusions. Relatively severe bronchial thickening is most prominent in the lower lobes. Fluid distends the azygos fissure. Upper Abdomen: No acute abnormality. Musculoskeletal: There are no acute or suspicious osseous abnormalities. Scattered calcified intervertebral discs. Review of the MIP images confirms the above findings. IMPRESSION: 1. No pulmonary embolus. 2. Findings consistent with right heart/congestive heart failure. Moderate pleural effusions, mild pulmonary edema, jugular venous distention and contrast refluxing into the hepatic veins and IVC. 3. Emphysema. Focal opacity just distal to a small blood in the right upper lobe may be chronic scarring or pneumonia. 4. Bronchial thickening, advanced, is most prominent in the lower lobes, may be acute or chronic. 5. Thoracic aortic atherosclerosis.  Coronary artery calcifications. Electronically Signed   By: Jeb Levering M.D.   On: 08/16/2016 04:31   Dg Chest Port 1 View  Result Date: 08/20/2016 CLINICAL DATA:  Acute respiratory distress EXAM: PORTABLE CHEST 1 VIEW COMPARISON:  CXR 08/17/2016 and CT 08/16/2016 FINDINGS: Heart size is normal. Aortic atherosclerosis involving the arch. Small left pleural effusion. Calcified granuloma projects over the left posterior sixth rib confirmed by recent CT. No pneumonic consolidation. No overt pulmonary edema. Probable atelectasis at the left lung base. IMPRESSION: Small left effusion. Left lower lobe atelectasis. Calcified left upper lobe  granuloma. Electronically Signed   By: Ashley Royalty M.D.   On: 08/20/2016 15:33    Micro Results    Recent Results (from the past 240 hour(s))  Respiratory Panel by PCR     Status: None   Collection Time: 08/16/16  8:30 AM  Result Value Ref Range Status   Adenovirus NOT DETECTED NOT DETECTED Final   Coronavirus 229E NOT DETECTED NOT DETECTED Final   Coronavirus HKU1 NOT DETECTED NOT DETECTED Final   Coronavirus NL63 NOT DETECTED NOT DETECTED Final   Coronavirus OC43 NOT DETECTED NOT DETECTED Final   Metapneumovirus NOT DETECTED NOT DETECTED Final   Rhinovirus / Enterovirus NOT DETECTED NOT DETECTED Final   Influenza A NOT DETECTED NOT DETECTED Final   Influenza B NOT DETECTED NOT DETECTED Final   Parainfluenza Virus 1 NOT DETECTED NOT DETECTED Final   Parainfluenza Virus 2 NOT DETECTED NOT DETECTED Final   Parainfluenza Virus 3 NOT DETECTED NOT DETECTED Final   Parainfluenza Virus 4 NOT DETECTED NOT DETECTED Final   Respiratory Syncytial Virus NOT DETECTED NOT DETECTED Final   Bordetella pertussis NOT DETECTED NOT DETECTED Final   Chlamydophila pneumoniae NOT DETECTED NOT DETECTED Final  Mycoplasma pneumoniae NOT DETECTED NOT DETECTED Final       Today   Subjective:   Traci [name] today has no headache,no chest or abdominal pain,no new weakness tingling or numbness, no cough, denies any dyspnea at rest.  Objective:   Blood pressure (!) 121/40, pulse 89, temperature 98.1 F (36.7 C), resp. rate 19, height 5' 9"  (1.753 m), weight 72.4 kg (159 lb 10 oz), SpO2 97 %.   Intake/Output Summary (Last 24 hours) at 08/25/16 1447 Last data filed at 08/25/16 1100  Gross per 24 hour  Intake              480 ml  Output                0 ml  Net              480 ml    Exam Awake Alert, Oriented x 3 Supple Neck,No JVD,  Symmetrical Chest wall movement, Good air movement bilaterally, CTAB RRR,No Gallops,Rubs, No Parasternal Heave, + murmurs +ve B.Sounds, Abd Soft, Non tender,   No rebound -guarding or rigidity. No Cyanosis, Clubbing or edema, No new Rash or bruise  Data Review   CBC w Diff: Lab Results  Component Value Date   WBC 6.9 08/22/2016   HGB 9.8 (L) 08/22/2016   HCT 31.1 (L) 08/22/2016   PLT 241 08/22/2016   LYMPHOPCT 21 03/26/2014   MONOPCT 7 03/26/2014   EOSPCT 2 03/26/2014   BASOPCT 0 03/26/2014    CMP: Lab Results  Component Value Date   NA 136 08/22/2016   NA 135 08/22/2016   K 4.3 08/22/2016   K 4.2 08/22/2016   CL 94 (L) 08/22/2016   CL 93 (L) 08/22/2016   CO2 36 (H) 08/22/2016   CO2 37 (H) 08/22/2016   BUN 9 08/22/2016   BUN 10 08/22/2016   CREATININE 0.98 08/23/2016   PROT 6.3 03/28/2014   ALBUMIN 3.1 (L) 03/28/2014   BILITOT 0.7 03/28/2014   ALKPHOS 50 03/28/2014   AST 14 03/28/2014   ALT 10 03/28/2014  .   Total Time in preparing paper work, data evaluation and todays exam - 35 minutes  Emorie Mcfate M.D on 08/25/2016 at 2:47 PM  [facility]   Office  [phone]

## 2016-08-25 NOTE — Discharge Instructions (Signed)
Follow with Primary MD HAGUE, Rosalyn Charters, MD in 7 days   Get CBC, CMP, 2 view Chest X ray checked  by Primary MD next visit.    Activity: As tolerated with Full fall precautions use walker/cane & assistance as needed   Disposition Home    Diet: Heart Healthy , low-salt with fluid restriction 1500 mL per day , with feeding assistance and aspiration precautions.  For Heart failure patients - Check your Weight same time everyday, if you gain over 2 pounds, or you develop in leg swelling, experience more shortness of breath or chest pain, call your Primary MD immediately. Follow Cardiac Low Salt Diet and 1.5 lit/day fluid restriction.   On your next visit with your primary care physician please Get Medicines reviewed and adjusted.   Please request your Prim.MD to go over all Hospital Tests and Procedure/Radiological results at the follow up, please get all Hospital records sent to your Prim MD by signing hospital release before you go home.   If you experience worsening of your admission symptoms, develop shortness of breath, life threatening emergency, suicidal or homicidal thoughts you must seek medical attention immediately by calling 911 or calling your MD immediately  if symptoms less severe.  You Must read complete instructions/literature along with all the possible adverse reactions/side effects for all the Medicines you take and that have been prescribed to you. Take any new Medicines after you have completely understood and accpet all the possible adverse reactions/side effects.   Do not drive, operating heavy machinery, perform activities at heights, swimming or participation in water activities or provide baby sitting services if your were admitted for syncope or siezures until you have seen by Primary MD or a Neurologist and advised to do so again.  Do not drive when taking Pain medications.    Do not take more than prescribed Pain, Sleep and Anxiety Medications  Special  Instructions: If you have smoked or chewed Tobacco  in the last 2 yrs please stop smoking, stop any regular Alcohol  and or any Recreational drug use.  Wear Seat belts while driving.   Please note  You were cared for by a hospitalist during your hospital stay. If you have any questions about your discharge medications or the care you received while you were in the hospital after you are discharged, you can call the unit and asked to speak with the hospitalist on call if the hospitalist that took care of you is not available. Once you are discharged, your primary care physician will handle any further medical issues. Please note that NO REFILLS for any discharge medications will be authorized once you are discharged, as it is imperative that you return to your primary care physician (or establish a relationship with a primary care physician if you do not have one) for your aftercare needs so that they can reassess your need for medications and monitor your lab values.

## 2016-09-14 ENCOUNTER — Telehealth: Payer: Self-pay | Admitting: Pulmonary Disease

## 2016-09-14 ENCOUNTER — Ambulatory Visit (INDEPENDENT_AMBULATORY_CARE_PROVIDER_SITE_OTHER): Payer: Medicare Other | Admitting: Internal Medicine

## 2016-09-14 ENCOUNTER — Other Ambulatory Visit: Payer: Self-pay | Admitting: *Deleted

## 2016-09-14 ENCOUNTER — Encounter: Payer: Self-pay | Admitting: Internal Medicine

## 2016-09-14 VITALS — BP 100/51 | HR 76 | Ht 69.0 in | Wt 159.8 lb

## 2016-09-14 DIAGNOSIS — I10 Essential (primary) hypertension: Secondary | ICD-10-CM | POA: Diagnosis not present

## 2016-09-14 DIAGNOSIS — Z79899 Other long term (current) drug therapy: Secondary | ICD-10-CM | POA: Diagnosis not present

## 2016-09-14 DIAGNOSIS — I5033 Acute on chronic diastolic (congestive) heart failure: Secondary | ICD-10-CM

## 2016-09-14 DIAGNOSIS — Z87891 Personal history of nicotine dependence: Secondary | ICD-10-CM

## 2016-09-14 DIAGNOSIS — R0602 Shortness of breath: Secondary | ICD-10-CM

## 2016-09-14 DIAGNOSIS — I080 Rheumatic disorders of both mitral and aortic valves: Secondary | ICD-10-CM

## 2016-09-14 MED ORDER — INDACATEROL-GLYCOPYRROLATE 27.5-15.6 MCG IN CAPS: 1.0000 | ORAL_CAPSULE | Freq: Two times a day (BID) | RESPIRATORY_TRACT | 5 refills | Status: DC

## 2016-09-14 NOTE — Telephone Encounter (Signed)
Called pt to discuss  She states ran out of the sample of Utibron and thought she was only supposed to be using proair  I explained that the proair is just for rescue, and she needs to use the utibron daily for maint She verbalized understanding and I have sent rx in for the utibron for her  Nothing further needed

## 2016-09-14 NOTE — Patient Instructions (Addendum)
Your physician recommends that you schedule a follow-up appointment in: [place] with Dr. Debara Pickett  Your physician recommends that you return for lab work TODAY - BMET, BNP  Your physician recommends that you continue on your current medications as directed. Please refer to the Current Medication list given to you today.

## 2016-09-14 NOTE — Progress Notes (Signed)
OFFICE NOTE  Chief Complaint:  Short of breat, fatigue, right chest pain  Primary Care Physician: Bonnita Nasuti, MD  HPI:  Traci [name] is a 71 y.o. female former patient of Dr. Mare Ferrari (last seen in 2013) who is scheduled to see me for the first time today. She was recently seen in the hospital by my partner Dr. Marlou Porch. Her last heart catheterization was in June 2013 which she underwent coronary angiography and had nonobstructive coronary disease with normal LV function. She also has moderate to severe COPD, hypertension, anxiety and depression. She was admitted just after Christmas with progressive exertional dyspnea and right-sided chest discomfort. There is associated nonproductive cough. Findings were consistent with COPD/diastolic CHF exacerbation. Echo was performed which is a technically difficult study showing grade 2 diastolic dysfunction at least moderate aortic insufficiency and moderate mitral valve stenosis, possibly rheumatic. She then underwent TEE on 08/22/2016, which demonstrated EF of 60-65%, moderate to severe aortic insufficiency and a rheumatic mitral valve with diastolic leaflet doming and moderate stenosis with moderate to severe regurgitation. Calculated valve area was 1.14 cm. RVSP is 39 mmHg. She was diuresed and on discharge weight was around 160 pounds. She reports her weight at home is about 152 pounds and her weight today in the office was 159 pounds. She denies any history of lower extremity edema or abdominal swelling although occasionally feels some bloating. She has been on oxygen since discharge she says helps her the most but her breathing has not improved back to anywhere near her baseline.  PMHx:  Past Medical History:  Diagnosis Date  . Anginal pain ([place])   . Anxiety   . Arthritis   . Chronic bronchitis ([place])   . Chronic lower back pain   . COPD (chronic obstructive pulmonary disease) ([place])   . Depression   . Dyspepsia   . GERD  (gastroesophageal reflux disease)   . H/O hiatal hernia   . [id number]    "1-2/week" (08/16/2016)  . History of blood transfusion 1963   w/childbirth  . Hyperlipidemia   . Hypertension   . Hypothyroidism   . Shortness of breath 02/12/12   "all the time"  . Tremor     Past Surgical History:  Procedure Laterality Date  . BACK SURGERY    . CARDIAC CATHETERIZATION  04/19/1995   EF 65-70%  . CATARACT EXTRACTION W/ INTRAOCULAR LENS  IMPLANT, BILATERAL  2012  . FRACTURE SURGERY    . LAPAROSCOPIC CHOLECYSTECTOMY  2010  . LEFT HEART CATHETERIZATION WITH CORONARY ANGIOGRAM N/A 02/13/2012   Procedure: LEFT HEART CATHETERIZATION WITH CORONARY ANGIOGRAM;  Surgeon: Thayer Headings, MD;  Location: [facility] CATH LAB;  Service: Cardiovascular;  Laterality: N/A;  . LUMBAR DISC SURGERY  early 2000's  . TEE WITHOUT CARDIOVERSION N/A 08/22/2016   Procedure: TRANSESOPHAGEAL ECHOCARDIOGRAM (TEE);  Surgeon: Sanda Klein, MD;  Location: [place];  Service: Cardiovascular;  Laterality: N/A;  . TIBIA IM NAIL INSERTION Right 03/30/2014   Procedure: INTRAMEDULLARY (IM) NAIL TIBIAL;  Surgeon: Wylene Simmer, MD;  Location: [place];  Service: Orthopedics;  Laterality: Right;  . US ECHOCARDIOGRAPHY  12/17/2007   EF 55-60%  . VAGINAL HYSTERECTOMY  1980's    FAMHx:  Family History  Problem Relation Age of Onset  . Heart attack Father     SOCHx:   reports that she has been smoking Cigarettes.  She has a 5.50 pack-year smoking history. She quit smokeless tobacco use about 4 weeks ago. She reports that she does  not drink alcohol or use drugs.  ALLERGIES:  Allergies  Allergen Reactions  . Cephalexin Hives  . Cymbalta [Duloxetine Hcl] Other (See Comments)    Sees spiders  . Latex     " MAKES ME ITCHY "     ROS: Pertinent items noted in HPI and remainder of comprehensive ROS otherwise negative.  HOME MEDS: Current Outpatient Prescriptions on File Prior to Visit  Medication Sig Dispense Refill  .  albuterol (PROVENTIL HFA;VENTOLIN HFA) 108 (90 BASE) MCG/ACT inhaler Inhale 2 puffs into the lungs every 6 (six) hours as needed for wheezing or shortness of breath.    . ALPRAZolam (XANAX) 1 MG tablet Take 1 mg by mouth 2 (two) times daily as needed for anxiety.     Marland Kitchen buPROPion (WELLBUTRIN XL) 150 MG 24 hr tablet Take 450 mg by mouth daily.     . busPIRone (BUSPAR) 10 MG tablet Take 10 mg by mouth 2 (two) times daily.     . Cholecalciferol (VITAMIN D3) 2000 units TABS Take 2,000 Units by mouth daily.    . citalopram (CELEXA) 20 MG tablet Take 20 mg by mouth daily.    . feeding supplement, ENSURE ENLIVE, (ENSURE ENLIVE) LIQD Take 237 mLs by mouth 2 (two) times daily between meals. 237 mL 12  . ferrous sulfate 325 (65 FE) MG tablet Take 1 tablet (325 mg total) by mouth 2 (two) times daily with a meal. 60 tablet 3  . folic acid (FOLVITE) 1 MG tablet Take 1 tablet (1 mg total) by mouth daily. 30 tablet 0  . furosemide (LASIX) 40 MG tablet Take 1 tablet (40 mg total) by mouth daily. 30 tablet 1  . guaiFENesin (MUCINEX) 600 MG 12 hr tablet Take 1 tablet (600 mg total) by mouth 2 (two) times daily. 20 tablet 0  . Indacaterol-Glycopyrrolate (UTIBRON NEOHALER) 27.5-15.6 MCG CAPS Place 1 capsule into inhaler and inhale 2 (two) times daily. 48 capsule 0  . levothyroxine (SYNTHROID, LEVOTHROID) 25 MCG tablet Take 25 mcg by mouth daily before breakfast.     . omeprazole (PRILOSEC) 20 MG capsule Take 20 mg by mouth daily.    Marland Kitchen oxyCODONE (ROXICODONE) 15 MG immediate release tablet Take 15 mg by mouth every 6 (six) hours as needed for pain.    . potassium chloride SA (K-DUR,KLOR-CON) 20 MEQ tablet Take 1 tablet (20 mEq total) by mouth daily. 30 tablet 0  . predniSONE (DELTASONE) 10 MG tablet Please take 40 mg oral daily for 2 days, then 30 mg oral daily for 2 days, then 20 mg oral daily for 2 days, then 10 mg oral daily for 2 days, then stop. 20 tablet 0  . primidone (MYSOLINE) 50 MG tablet Take 2 tablets (100 mg  total) by mouth 3 (three) times daily. (Patient taking differently: Take 50 mg by mouth 4 (four) times daily. ) 180 tablet 11  . traZODone (DESYREL) 150 MG tablet Take 300 mg by mouth at bedtime.     . Turmeric 500 MG TABS Take 500 mg by mouth daily.    . vitamin B-12 (CYANOCOBALAMIN) 1000 MCG tablet Take 1,000 mcg by mouth daily.     No current facility-administered medications on file prior to visit.     LABS/IMAGING: No results found for this or any previous visit (from the past 48 hour(s)). No results found.  WEIGHTS: Wt Readings from Last 3 Encounters:  09/14/16 159 lb 12.8 oz (72.5 kg)  08/25/16 159 lb 10 oz (72.4 kg)  08/01/16  159 lb 9.6 oz (72.4 kg)    VITALS: BP (!) 100/51   Pulse 76   Ht 5\' 9"  (1.753 m)   Wt 159 lb 12.8 oz (72.5 kg)   BMI 23.60 kg/m   EXAM: General appearance: alert and mild distress Neck: no carotid bruit and no JVD Lungs: diminished breath sounds bilaterally Heart: regular rate and rhythm Abdomen: soft, non-tender; bowel sounds normal; no masses,  no organomegaly Extremities: extremities normal, atraumatic, no cyanosis or edema Pulses: 2+ and symmetric Skin: Skin color, texture, turgor normal. No rashes or lesions Neurologic: Mental status: Alert, oriented, thought content appropriate Psych: Pleasant  EKG: Deferred  ASSESSMENT: 1. Rheumatic heart disease with moderate to severe AI and moderate to severe MR 2. Moderate mitral stenosis with thickening and cortical involvement 3. COPD on continuous oxygen 4. Acute on chronic diastolic congestive heart failure- NYHA class III symptoms  PLAN: 1.   Mrs. Zeitz was found to have rheumatic heart disease with aortic and mitral valve involvement. She has moderate to severe AI as well as moderate to severe MR and moderate mitral stenosis. In total, this demonstrates significant reduction in cardiac output to [place] to her heart failure symptoms as well as fatigue and significant dyspnea. She also has  underlying lung disease from COPD on continuous oxygen. The severity of that lung disease is not clearly understood. She does have upcoming pulmonary function tests and an office visit with Dr. Vaughan Browner. I will be in contact with him to see his and interpretation of the degree of her lung disease as it will be helpful to understand if it is prohibitive for her to possibly undergo surgery for her valvular heart disease. Given the extent of her valves, the optimal treatment for her would likely be aortic and mitral valve replacement. Obviously this is a large surgery and potentially could be prohibitive given her functional status and underlying lung disease. A surgical opinion will be sought. Ears somewhat euvolemic on exam today. We'll continue her current dose Lasix and check a metabolic profile and BNP.  Follow-up in 3 months.   Pixie Casino, MD, [facility] Attending Cardiologist Sc[place] C Grigor Lipschutz 09/14/2016, 8:34 AM

## 2016-09-15 LAB — BASIC METABOLIC PANEL
BUN: 8 mg/dL (ref 7–25)
CALCIUM: 9 mg/dL (ref 8.6–10.4)
CHLORIDE: 100 mmol/L (ref 98–110)
CO2: 27 mmol/L (ref 20–31)
CREATININE: 1.04 mg/dL — AB (ref 0.60–0.93)
Glucose, Bld: 85 mg/dL (ref 65–99)
Potassium: 4.9 mmol/L (ref 3.5–5.3)
Sodium: 137 mmol/L (ref 135–146)

## 2016-09-15 LAB — BRAIN NATRIURETIC PEPTIDE: BRAIN NATRIURETIC PEPTIDE: 150.8 pg/mL — AB (ref <100)

## 2016-09-18 ENCOUNTER — Ambulatory Visit (INDEPENDENT_AMBULATORY_CARE_PROVIDER_SITE_OTHER): Payer: Medicare Other | Admitting: Pulmonary Disease

## 2016-09-18 ENCOUNTER — Encounter: Payer: Self-pay | Admitting: Pulmonary Disease

## 2016-09-18 VITALS — BP 110/72 | HR 90

## 2016-09-18 DIAGNOSIS — F17210 Nicotine dependence, cigarettes, uncomplicated: Secondary | ICD-10-CM

## 2016-09-18 DIAGNOSIS — F17200 Nicotine dependence, unspecified, uncomplicated: Secondary | ICD-10-CM | POA: Diagnosis not present

## 2016-09-18 DIAGNOSIS — J449 Chronic obstructive pulmonary disease, unspecified: Secondary | ICD-10-CM | POA: Diagnosis not present

## 2016-09-18 LAB — PULMONARY FUNCTION TEST
DL/VA % PRED: 53 %
DL/VA: 2.88 ml/min/mmHg/L
DLCO COR % PRED: 35 %
DLCO cor: 10.9 ml/min/mmHg
DLCO unc % pred: 34 %
DLCO unc: 10.59 ml/min/mmHg
FEF 25-75 POST: 0.82 L/s
FEF 25-75 Pre: 0.54 L/sec
FEF2575-%CHANGE-POST: 51 %
FEF2575-%PRED-POST: 39 %
FEF2575-%Pred-Pre: 25 %
FEV1-%CHANGE-POST: 15 %
FEV1-%Pred-Post: 50 %
FEV1-%Pred-Pre: 43 %
FEV1-Post: 1.36 L
FEV1-Pre: 1.18 L
FEV1FVC-%CHANGE-POST: -2 %
FEV1FVC-%PRED-PRE: 75 %
FEV6-%Change-Post: 15 %
FEV6-%Pred-Post: 69 %
FEV6-%Pred-Pre: 60 %
FEV6-POST: 2.37 L
FEV6-Pre: 2.05 L
FEV6FVC-%Change-Post: -2 %
FEV6FVC-%PRED-POST: 100 %
FEV6FVC-%Pred-Pre: 103 %
FVC-%CHANGE-POST: 18 %
FVC-%PRED-POST: 69 %
FVC-%PRED-PRE: 58 %
FVC-POST: 2.46 L
FVC-PRE: 2.07 L
POST FEV1/FVC RATIO: 55 %
PRE FEV6/FVC RATIO: 99 %
Post FEV6/FVC ratio: 96 %
Pre FEV1/FVC ratio: 57 %
RV % pred: 290 %
RV: 7.19 L
TLC % pred: 165 %
TLC: 9.61 L

## 2016-09-18 MED ORDER — BUDESONIDE-FORMOTEROL FUMARATE 160-4.5 MCG/ACT IN AERO: 2.0000 | INHALATION_SPRAY | Freq: Two times a day (BID) | RESPIRATORY_TRACT | 3 refills | Status: DC

## 2016-09-18 MED ORDER — BUDESONIDE-FORMOTEROL FUMARATE 160-4.5 MCG/ACT IN AERO: 2.0000 | INHALATION_SPRAY | Freq: Two times a day (BID) | RESPIRATORY_TRACT | 0 refills | Status: DC

## 2016-09-18 NOTE — Progress Notes (Addendum)
Traci [name]    QL:[id number]    1946-06-25  Primary Care Physician:HAGUE, Rosalyn Charters, MD  Referring Physician: Raelyn Number, MD [address] [place],  [address]  Chief complaint:   Follow up for severe COPD  HPI: Traci [name] is a 71 year old with past medical history of hypertension, hyperlipidemia, anxiety, heavy active smoking, tremors. She has complains of worsening dyspnea at rest and activity for the past 3-4 years. She has chronic cough with daily sputum production, daily wheezing. She is just on albuterol when necessary which she uses up to 2-3 times every day. She denies any nighttime awakenings, chest pain, palpitations, fevers, chills. She has back, spine pain when she coughs.  She is a 50-pack-year smoking history. She continues to smoke although she is trying to quit and has cut down to 3 cigarettes per day. She used to work in a [employer] and has been exposed to fiber and dust. There are no known exposures to asbestos.  Interim history: She was admitted in the end of December for respiratory failure secondary to CHF exacerbation. Chest x-ray showed pleural effusions and edema. CT of the chest during this admission was negative for PE. Cardiology was consulted and she was diuresed with improvement in symptoms. She was given Utibron inhaler at last visit but has stopped taking it. She is currently on just albuterol inhaler.  Outpatient Encounter Prescriptions as of 09/18/2016  Medication Sig  . albuterol (PROVENTIL HFA;VENTOLIN HFA) 108 (90 BASE) MCG/ACT inhaler Inhale 2 puffs into the lungs every 6 (six) hours as needed for wheezing or shortness of breath.  . ALPRAZolam (XANAX) 1 MG tablet Take 1 mg by mouth 2 (two) times daily as needed for anxiety.   Marland Kitchen buPROPion (WELLBUTRIN XL) 150 MG 24 hr tablet Take 450 mg by mouth daily.   . busPIRone (BUSPAR) 10 MG tablet Take 10 mg by mouth 2 (two) times daily.   . Cholecalciferol (VITAMIN D3) 2000 units TABS  Take 2,000 Units by mouth daily.  . citalopram (CELEXA) 20 MG tablet Take 20 mg by mouth daily.  . feeding supplement, ENSURE ENLIVE, (ENSURE ENLIVE) LIQD Take 237 mLs by mouth 2 (two) times daily between meals.  . ferrous sulfate 325 (65 FE) MG tablet Take 1 tablet (325 mg total) by mouth 2 (two) times daily with a meal.  . folic acid (FOLVITE) 1 MG tablet Take 1 tablet (1 mg total) by mouth daily.  . furosemide (LASIX) 40 MG tablet Take 1 tablet (40 mg total) by mouth daily.  . Indacaterol-Glycopyrrolate (UTIBRON NEOHALER) 27.5-15.6 MCG CAPS Place 1 capsule into inhaler and inhale 2 (two) times daily.  Marland Kitchen levothyroxine (SYNTHROID, LEVOTHROID) 25 MCG tablet Take 25 mcg by mouth daily before breakfast.   . omeprazole (PRILOSEC) 20 MG capsule Take 20 mg by mouth daily.  Marland Kitchen oxyCODONE (ROXICODONE) 15 MG immediate release tablet Take 15 mg by mouth every 6 (six) hours as needed for pain.  . OXYGEN Inhale 2 L into the lungs as needed.  . potassium chloride SA (K-DUR,KLOR-CON) 20 MEQ tablet Take 1 tablet (20 mEq total) by mouth daily.  . primidone (MYSOLINE) 50 MG tablet Take 2 tablets (100 mg total) by mouth 3 (three) times daily. (Patient taking differently: Take 50 mg by mouth 4 (four) times daily. )  . traZODone (DESYREL) 150 MG tablet Take 300 mg by mouth at bedtime.   . Turmeric 500 MG TABS Take 500 mg by mouth  daily.  . vitamin B-12 (CYANOCOBALAMIN) 1000 MCG tablet Take 1,000 mcg by mouth daily.  . [DISCONTINUED] guaiFENesin (MUCINEX) 600 MG 12 hr tablet Take 1 tablet (600 mg total) by mouth 2 (two) times daily.  . [DISCONTINUED] predniSONE (DELTASONE) 10 MG tablet Please take 40 mg oral daily for 2 days, then 30 mg oral daily for 2 days, then 20 mg oral daily for 2 days, then 10 mg oral daily for 2 days, then stop.   No facility-administered encounter medications on file as of 09/18/2016.     Allergies as of 09/18/2016 - Review Complete 09/18/2016  Allergen Reaction Noted  . Cephalexin Hives     . Cymbalta [duloxetine hcl] Other (See Comments) 07/13/2014  . Latex  03/26/2014    Past Medical History:  Diagnosis Date  . Anginal pain ([place])   . Anxiety   . Arthritis   . Chronic bronchitis ([place])   . Chronic lower back pain   . COPD (chronic obstructive pulmonary disease) ([place])   . Depression   . Dyspepsia   . GERD (gastroesophageal reflux disease)   . H/O hiatal hernia   . [id number])    "1-2/week" (08/16/2016)  . History of blood transfusion 1963   w/childbirth  . Hyperlipidemia   . Hypertension   . Hypothyroidism   . Shortness of breath 02/12/12   "all the time"  . Tremor     Past Surgical History:  Procedure Laterality Date  . BACK SURGERY    . CARDIAC CATHETERIZATION  04/19/1995   EF 65-70%  . CATARACT EXTRACTION W/ INTRAOCULAR LENS  IMPLANT, BILATERAL  2012  . FRACTURE SURGERY    . LAPAROSCOPIC CHOLECYSTECTOMY  2010  . LEFT HEART CATHETERIZATION WITH CORONARY ANGIOGRAM N/A 02/13/2012   Procedure: LEFT HEART CATHETERIZATION WITH CORONARY ANGIOGRAM;  Surgeon: Thayer Headings, MD;  [facility] C[facility] LAB;  Service: Cardiovascular;  Laterality: N/A;  . LUMBAR DISC SURGERY  early 2000's  . TEE WITHOUT CARDIOVERSION N/A 08/22/2016   Procedure: TRANSESOPHAGEAL ECHOCARDIOGRAM (TEE);  Surgeon: Sanda Klein, MD;  Location: [place];  Service: Cardiovascular;  Laterality: N/A;  . TIBIA IM NAIL INSERTION Right 03/30/2014   Procedure: INTRAMEDULLARY (IM) NAIL TIBIAL;  Surgeon: Wylene Simmer, MD;  Location: [place];  Service: Orthopedics;  Laterality: Right;  . US ECHOCARDIOGRAPHY  12/17/2007   EF 55-60%  . VAGINAL HYSTERECTOMY  1980's    Family History  Problem Relation Age of Onset  . Heart attack Father     Social History   Social History  . Marital status: Married    Spouse name: Mannie  . Number of children: 2  . Years of education: GED   Occupational History  . Not on file.   Social History Main Topics  . Smoking status: Former Smoker    Packs/day:  0.10    Years: 55.00    Types: Cigarettes    Quit date: 08/19/2016  . Smokeless tobacco: Former Systems developer    Quit date: 08/13/2016  . Alcohol use No  . Drug use: No  . Sexual activity: No   Other Topics Concern  . Not on file   Social History Narrative   Patient lives at home with her husband [facility]). Two children.   Retired.   Education GED.   Right handed.   Caffeine Four cups of coffee daily.   Review of systems: Review of Systems  Constitutional: Negative for fever and chills.  HENT: Negative.   Eyes: Negative for blurred vision.  Respiratory: as  per HPI  Cardiovascular: Negative for chest pain and palpitations.  Gastrointestinal: Negative for vomiting, diarrhea, blood per rectum. Genitourinary: Negative for dysuria, urgency, frequency and hematuria.  Musculoskeletal: Negative for myalgias, back pain and joint pain.  Skin: Negative for itching and rash.  Neurological: Negative for dizziness, tremors, focal weakness, seizures and loss of consciousness.  Endo/Heme/Allergies: Negative for environmental allergies.  Psychiatric/Behavioral: Negative for depression, suicidal ideas and hallucinations.  All other systems reviewed and are negative.  Physical Exam: Blood pressure 110/72, pulse 90, SpO2 99 %. Gen:      No acute distress HEENT:  EOMI, sclera anicteric Neck:     No masses; no thyromegaly Lungs:    Diminished breath sounds; normal respiratory effort CV:         Regular rate and rhythm; no murmurs Abd:      + bowel sounds; soft, non-tender; no palpable masses, no distension Ext:    No edema; adequate peripheral perfusion Skin:      Warm and dry; no rash Neuro: alert and oriented x 3 Psych: normal mood and affect  Data Reviewed: Chest x-ray 04/18/14-mild venous congestion, left midlung stable calcified granuloma. CT chest 05/21/09-no pulmonary medicine, emphysema, calcified left upper lobe granuloma CT chest 03/30/08-emphysema, calcified left upper lobe  granuloma Chest x-ray 01/03/05-left upper lobe granuloma. CT 08/16/16- no PE, moderate mitral pleural effusions, pulmonary edema, emphysema. There is focal consolidation/capacity All images personally reviewed.  PFTs 09/18/16 FVC 2.46 (69%) FEV1 1.36 (50%) F/F 55 TLC 165% RV/TLC 170% DLCO 34% Severe obstruction with air trapping, DLCO impairment. Positive bronchodilator response  Echocardiogram 08/17/16 Normal RV size and systolic function. There   was moderate aortic insufficiency, this may not have been fully   visualized. The mitral valve was not well-visualized. By doppler   indices, there is moderate mitral stenosis, ?rheumatic. Would   suggest TEE to more closely visualized the valves. There was mild   pulmonary hypertension.  Assessment:  #1 Severe COPD PFTs were reviewed with her. They show severe obstruction. She is currently on only albuterol and will need long-term medication she was previously tried on Utibron (LABA/LAMA) but has not noticed an improvement. I'll start on Symbicort during this visit.  #2 Abnormal CT Left upper lobe Granuloma Stable since 2006. Does Not Need Follow-Up. Her CT from the recent admission shows a right upper lobe opacity which may represent a focal pneumonia. This will need follow-up to resolution which can be done with the low-dose screening CTs  #3 Active Smoker.  We continue to discuss smoking cessation. She feels that she can do this on her own. Total time spent counseling-5 minutes. She has been referred for a total screening CTs of the chest.  Plan/Recommendations: - Start symbicort, continue albuterol rescue inhaler. - Smoking cessation - Low dose screening CT  Marshell Garfinkel MD [facility] Pager [phone] 09/18/2016, 4:42 PM  CC: Raelyn Number, MD

## 2016-09-18 NOTE — Patient Instructions (Addendum)
We will start you on Symbicort 160/4.5  2 puffs bid Continue the albuterol PRN as needed.  Return in 3 months

## 2016-09-19 ENCOUNTER — Telehealth: Payer: Self-pay

## 2016-09-24 ENCOUNTER — Ambulatory Visit (INDEPENDENT_AMBULATORY_CARE_PROVIDER_SITE_OTHER): Payer: Medicare Other | Admitting: Acute Care

## 2016-09-24 ENCOUNTER — Ambulatory Visit (INDEPENDENT_AMBULATORY_CARE_PROVIDER_SITE_OTHER)
Admission: RE | Admit: 2016-09-24 | Discharge: 2016-09-24 | Disposition: A | Discharge status: Home / Self Care | Payer: Medicare Other | Source: Ambulatory Visit | Attending: Acute Care | Admitting: Acute Care

## 2016-09-24 ENCOUNTER — Encounter: Payer: Self-pay | Admitting: Acute Care

## 2016-09-24 DIAGNOSIS — Z87891 Personal history of nicotine dependence: Secondary | ICD-10-CM

## 2016-09-24 NOTE — Progress Notes (Signed)
Shared Decision Making Visit Lung Cancer Screening Program [phone])   Eligibility:  Age 71 y.o.  Pack Years Smoking History Calculation 57 pack years (# packs/per year x # years smoked)  Recent History of coughing up blood  no  Unexplained weight loss? no ( >Than 15 pounds within the last 6 months )  Prior History Lung / other cancer no (Diagnosis within the last 5 years already requiring surveillance chest CT Scans).  Smoking Status Former Smoker  Former Smokers: Years since quit: < 1 year  Quit Date: 08/19/2016  Visit Components:  Discussion included one or more decision making aids. yes  Discussion included risk/benefits of screening. yes  Discussion included potential follow up diagnostic testing for abnormal scans. yes  Discussion included meaning and risk of over diagnosis. yes  Discussion included meaning and risk of False Positives. yes  Discussion included meaning of total radiation exposure. yes  Counseling Included:  Importance of adherence to annual lung cancer LDCT screening. yes  Impact of comorbidities on ability to participate in the program. yes  Ability and willingness to under diagnostic treatment. yes  Smoking Cessation Counseling:  Current Smokers:   Discussed importance of smoking cessation. NA, former smoker  Information about tobacco cessation classes and interventions provided to patient.NA  Patient provided with "ticket" for LDCT Scan. yes  Symptomatic Patient. no  Counseling  Diagnosis Code: Tobacco Use Z72.0  Asymptomatic Patient yes  Counseling (Intermediate counseling: > three minutes counseling) [id number]  Former Smokers:   Discussed the importance of maintaining cigarette abstinence. yes  Diagnosis Code: Personal History of Nicotine Dependence. [id number]  Information about tobacco cessation classes and interventions provided to patient. Yes  Patient provided with "ticket" for LDCT Scan. yes  Written Order for Lung  Cancer Screening with LDCT placed in Epic. Yes (CT Chest Lung Cancer Screening Low Dose W/O CM) [id number] Z12.2-Screening of respiratory organs Z87.891-Personal history of nicotine dependence  I spent 20 minutes of face to face time with Ms. Speich discussing the risks and benefits of lung cancer screening. We viewed a power point together that explained in detail the above noted topics. We took the time to pause the power point at intervals to allow for questions to be asked and answered to ensure understanding. We discussed that she had taken the single most powerful action possible to decrease her risk of developing lung cancer when she quit smoking. I counseled her to remain smoke free, and to contact me if she ever had the desire to smoke again so that I can provide resources and tools to help support the effort to remain smoke free. We discussed the time and location of the scan, and that either [place] or I will call with the results within  24-48 hours of receiving them. Mrs. Sundby has my card and contact information in the event she needs to speak with me, in addition to a copy of the power point we reviewed as a resource. Mrs. Begin verbalized understanding of all of the above and had no further questions upon leaving the office.   We did discuss the fact that there has been a high incidence of coronary artery disease noted on these exams. She is currently not taking a statin. She is however following up with cardiologist. Patient does have tremors that are non-parkinsonian. She appears somewhat debilitated as a result of that, however PFTs were adequate based on surgical guidelines, and therefore patient will be screened  I spent 4 minutes discussing maintaining smoking  abstinence with this patient.  Magdalen Spatz, NP 09/24/2016

## 2016-09-25 ENCOUNTER — Telehealth: Payer: Self-pay | Admitting: Acute Care

## 2016-09-25 DIAGNOSIS — Z87891 Personal history of nicotine dependence: Secondary | ICD-10-CM

## 2016-09-25 NOTE — Telephone Encounter (Signed)
I have called Traci [name] with the results of her low-dose screening CT. Her scan was read as a lung RADS 2, indicating nodules that are benign in appearance or behavior. Recommendation per radiology is for repeat low dose screening in February 2019. We will order and schedule the scan. Additional findings included small bilateral pleural effusions left greater than right. I explained this to Traci [name], and told her that I forward the results of the scan to her primary care physician for follow-up as he feels is clinically indicated as he knows her health history well. Traci [name] verbalized understanding of the above and had no further questions at completion of the phone call.

## 2016-09-25 NOTE — Telephone Encounter (Signed)
I called Mrs. Traci [name] primary care physician to let them know I would be faxing the results of her low-dose screening CT to the office. I explained that the scan was read as a lung RADS 2, indicating nodules that are benign in appearance and behavior. I explained that I called this result to the patient. I wanted to make sure they were aware of the incidental finding of bilateral small pleural effusions in the event the physician would like to do a follow-up chest x-ray. The operator answering the phone stated as soon as the fax was received she would make sure that it was reviewed by the patient's primary care physician Dr. Celedonio Miyamoto.

## 2016-09-27 ENCOUNTER — Telehealth: Payer: Self-pay | Admitting: Acute Care

## 2016-09-27 NOTE — Telephone Encounter (Signed)
Will call and initiate PA on 09/28/16 morning.

## 2016-10-01 NOTE — Telephone Encounter (Signed)
Called [facility] at [phone]. Pt's ID: [id number]. PA has been started and sent for clinical review. PA determination could take up to 24-48 hours. Will await decision.

## 2016-10-03 NOTE — Telephone Encounter (Signed)
Called [facility] to check on status and its states it is still in review. Will await decision

## 2016-10-08 NOTE — Telephone Encounter (Signed)
PA for utiborn has been denied. Covered alternatives are anoro, bevespi, incruse & sprivia.  SG please advise on alternative. Thanks.

## 2016-10-08 NOTE — Telephone Encounter (Signed)
PM please advise. Thanks  

## 2016-10-09 NOTE — Telephone Encounter (Signed)
No need to start on alternative to utibron. At last visit she was switched from utibron to symbicort.  PM

## 2016-10-16 NOTE — Telephone Encounter (Signed)
close

## 2016-11-03 ENCOUNTER — Emergency Department (HOSPITAL_COMMUNITY): Payer: Medicare Other

## 2016-11-03 ENCOUNTER — Encounter (HOSPITAL_COMMUNITY): Payer: Self-pay | Admitting: Nurse Practitioner

## 2016-11-03 ENCOUNTER — Emergency Department (HOSPITAL_COMMUNITY)
Admission: EM | Admit: 2016-11-03 | Discharge: 2016-11-03 | Disposition: A | Discharge status: Home / Self Care | Payer: Medicare Other | Attending: Emergency Medicine | Admitting: Emergency Medicine

## 2016-11-03 DIAGNOSIS — J449 Chronic obstructive pulmonary disease, unspecified: Secondary | ICD-10-CM | POA: Insufficient documentation

## 2016-11-03 DIAGNOSIS — E039 Hypothyroidism, unspecified: Secondary | ICD-10-CM | POA: Diagnosis not present

## 2016-11-03 DIAGNOSIS — Z91040 Latex allergy status: Secondary | ICD-10-CM | POA: Insufficient documentation

## 2016-11-03 DIAGNOSIS — I5031 Acute diastolic (congestive) heart failure: Secondary | ICD-10-CM | POA: Diagnosis not present

## 2016-11-03 DIAGNOSIS — W1839XA Other fall on same level, initial encounter: Secondary | ICD-10-CM | POA: Insufficient documentation

## 2016-11-03 DIAGNOSIS — Z79899 Other long term (current) drug therapy: Secondary | ICD-10-CM | POA: Insufficient documentation

## 2016-11-03 DIAGNOSIS — I110 Hypertensive heart disease with heart failure: Secondary | ICD-10-CM | POA: Insufficient documentation

## 2016-11-03 DIAGNOSIS — I252 Old myocardial infarction: Secondary | ICD-10-CM | POA: Diagnosis not present

## 2016-11-03 DIAGNOSIS — Y939 Activity, unspecified: Secondary | ICD-10-CM | POA: Insufficient documentation

## 2016-11-03 DIAGNOSIS — R296 Repeated falls: Secondary | ICD-10-CM

## 2016-11-03 DIAGNOSIS — M25551 Pain in right hip: Secondary | ICD-10-CM

## 2016-11-03 DIAGNOSIS — M545 Low back pain, unspecified: Secondary | ICD-10-CM

## 2016-11-03 DIAGNOSIS — Z87891 Personal history of nicotine dependence: Secondary | ICD-10-CM | POA: Insufficient documentation

## 2016-11-03 DIAGNOSIS — Y999 Unspecified external cause status: Secondary | ICD-10-CM | POA: Diagnosis not present

## 2016-11-03 DIAGNOSIS — I9589 Other hypotension: Secondary | ICD-10-CM | POA: Insufficient documentation

## 2016-11-03 DIAGNOSIS — Y929 Unspecified place or not applicable: Secondary | ICD-10-CM | POA: Diagnosis not present

## 2016-11-03 LAB — CBC WITH DIFFERENTIAL/PLATELET
BASOS PCT: 0 %
Basophils Absolute: 0 10*3/uL (ref 0.0–0.1)
EOS ABS: 0.2 10*3/uL (ref 0.0–0.7)
EOS PCT: 4 %
HCT: 32.5 % — ABNORMAL LOW (ref 36.0–46.0)
Hemoglobin: 10.5 g/dL — ABNORMAL LOW (ref 12.0–15.0)
Lymphocytes Relative: 20 %
Lymphs Abs: 1.1 10*3/uL (ref 0.7–4.0)
MCH: 28.5 pg (ref 26.0–34.0)
MCHC: 32.3 g/dL (ref 30.0–36.0)
MCV: 88.3 fL (ref 78.0–100.0)
MONOS PCT: 9 %
Monocytes Absolute: 0.5 10*3/uL (ref 0.1–1.0)
NEUTROS PCT: 67 %
Neutro Abs: 3.8 10*3/uL (ref 1.7–7.7)
PLATELETS: 215 10*3/uL (ref 150–400)
RBC: 3.68 MIL/uL — AB (ref 3.87–5.11)
RDW: 14 % (ref 11.5–15.5)
WBC: 5.6 10*3/uL (ref 4.0–10.5)

## 2016-11-03 LAB — BASIC METABOLIC PANEL
Anion gap: 9 (ref 5–15)
BUN: 7 mg/dL (ref 6–20)
CALCIUM: 8.6 mg/dL — AB (ref 8.9–10.3)
CO2: 31 mmol/L (ref 22–32)
CREATININE: 1.08 mg/dL — AB (ref 0.44–1.00)
Chloride: 91 mmol/L — ABNORMAL LOW (ref 101–111)
GFR calc non Af Amer: 50 mL/min — ABNORMAL LOW (ref >60)
GFR, EST AFRICAN AMERICAN: 58 mL/min — AB (ref >60)
Glucose, Bld: 103 mg/dL — ABNORMAL HIGH (ref 65–99)
Potassium: 3.5 mmol/L (ref 3.5–5.1)
SODIUM: 131 mmol/L — AB (ref 135–145)

## 2016-11-03 LAB — I-STAT CG4 LACTIC ACID, ED: LACTIC ACID, VENOUS: 0.71 mmol/L (ref 0.5–1.9)

## 2016-11-03 MED ORDER — SODIUM CHLORIDE 0.9 % IV BOLUS (SEPSIS)
500.0000 mL | Freq: Once | INTRAVENOUS | Status: AC
  Administered 2016-11-03: 500 mL via INTRAVENOUS

## 2016-11-03 MED ORDER — SODIUM CHLORIDE 0.9 % IV BOLUS (SEPSIS): 1000.0000 mL | Freq: Once | INTRAVENOUS | Status: DC

## 2016-11-03 MED ORDER — FENTANYL CITRATE (PF) 100 MCG/2ML IJ SOLN
50.0000 ug | INTRAMUSCULAR | Status: DC | PRN
  Administered 2016-11-03: 50 ug via INTRAVENOUS

## 2016-11-03 MED ORDER — FENTANYL CITRATE (PF) 100 MCG/2ML IJ SOLN
50.0000 ug | INTRAMUSCULAR | Status: DC | PRN
  Filled 2016-11-03: qty 2

## 2016-11-03 NOTE — Discharge Instructions (Signed)
Hold Lasix until you see her physician on Monday or Tuesday. Stay well-hydrated. Stand up slowly and sit down if you feel lightheaded.  If you were given medicines take as directed.  If you are on coumadin or contraceptives realize their levels and effectiveness is altered by many different medicines.  If you have any reaction (rash, tongues swelling, other) to the medicines stop taking and see a physician.    If your blood pressure was elevated in the ER make sure you follow up for management with a primary doctor or return for chest pain, shortness of breath or stroke symptoms.  Please follow up as directed and return to the ER or see a physician for new or worsening symptoms.  Thank you. Vitals:   11/03/16 2130 11/03/16 2200 11/03/16 2245 11/03/16 2315  BP: (!) 104/46 (!) 94/42 (!) 112/40 (!) 109/51  Pulse: 90 89 98 91  Resp:   (!) 26 13  Temp:      TempSrc:      SpO2: 99% 100% 99% 100%

## 2016-11-03 NOTE — ED Provider Notes (Signed)
[facility] DEPT Provider Note   CSN: [id number] Arrival date & time: 11/03/16  1831     History   Chief Complaint Chief Complaint  Patient presents with  . Fall    HPI Traci [name] is a 71 y.o. female.  Patient with hyperlipidemia, high blood pressure, chronic pain, reflux, COPD presents with right hip and lower back pain since a fall last Friday. Patient's had multiple falls recently no syncope or chest pain associated. Patient has had worsening tremors for which they did not see neurology outpatient. No fevers chills or infectious symptoms. Pain with palpation of lower back and right hip. No neurologic symptoms including weakness or numbness in lower extremities.      Past Medical History:  Diagnosis Date  . Anginal pain ([place])   . Anxiety   . Arthritis   . Chronic bronchitis ([place])   . Chronic lower back pain   . COPD (chronic obstructive pulmonary disease) ([place])   . Depression   . Dyspepsia   . GERD (gastroesophageal reflux disease)   . H/O hiatal hernia   . TDSKAJGO(115.7)    "1-2/week" (08/16/2016)  . History of blood transfusion 1963   w/childbirth  . Hyperlipidemia   . Hypertension   . Hypothyroidism   . Shortness of breath 02/12/12   "all the time"  . Tremor     Patient Active Problem List   Diagnosis Date Noted  . Shortness of breath 09/14/2016  . 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 Acute diastolic congestive heart failure ([place])   . CHF (congestive heart failure) ([place]) 08/16/2016  . Anemia 08/16/2016  . Chronic back pain 08/16/2016  . Hypothyroidism 08/16/2016  . Benign essential tremor 08/16/2016  . Acute respiratory failure with hypoxia ([place]) 04/01/2014  . Sinus tachycardia 03/31/2014  . Fractured tibia and fibula 03/26/2014  . Right tibial fracture 03/26/2014  . Closed right tibial fracture 03/26/2014  . Unstable gait 03/26/2014  . Chest pain syndrome 02/12/2012  . Spinal stenosis 09/24/2011  . HEARTBURN 08/03/2010    . BENIGN NEOPLASM OF ADRENAL GLAND 07/28/2010  . Anxiety disorder 07/28/2010  . MYOCARDIAL INFARCTION, HX OF 07/28/2010  . COPD with acute bronchitis ([place]) 07/28/2010  . DIVERTICULOSIS, COLON 07/28/2010  . COLONIC POLYPS, ADENOMATOUS, HX OF 07/28/2010  . HLD (hyperlipidemia) 06/23/2010  . HTN (hypertension) 06/23/2010  . ACUTE DILATATION OF STOMACH 06/23/2010  . ABDOMINAL PAIN, GENERALIZED 06/23/2010    Past Surgical History:  Procedure Laterality Date  . BACK SURGERY    . CARDIAC CATHETERIZATION  04/19/1995   EF 65-70%  . CATARACT EXTRACTION W/ INTRAOCULAR LENS  IMPLANT, BILATERAL  2012  . FRACTURE SURGERY    . LAPAROSCOPIC CHOLECYSTECTOMY  2010  . LEFT HEART CATHETERIZATION WITH CORONARY ANGIOGRAM N/A 02/13/2012   Procedure: LEFT HEART CATHETERIZATION WITH CORONARY ANGIOGRAM;  Surgeon: Thayer Headings, MD;  Location: [facility] CATH LAB;  Service: Cardiovascular;  Laterality: N/A;  . LUMBAR DISC SURGERY  early 2000's  . TEE WITHOUT CARDIOVERSION N/A 08/22/2016   Procedure: TRANSESOPHAGEAL ECHOCARDIOGRAM (TEE);  Surgeon: Sanda Klein, MD;  Location: [facility];  Service: Cardiovascular;  Laterality: N/A;  . TIBIA IM NAIL INSERTION Right 03/30/2014   Procedure: INTRAMEDULLARY (IM) NAIL TIBIAL;  Surgeon: Wylene Simmer, MD;  Location: [facility];  Service: Orthopedics;  Laterality: Right;  . US ECHOCARDIOGRAPHY  12/17/2007   EF 55-60%  . VAGINAL HYSTERECTOMY  1980's    OB History    No data available  Home Medications    Prior to Admission medications   Medication Sig Start Date End Date Taking? Authorizing Provider  albuterol (PROVENTIL HFA;VENTOLIN HFA) 108 (90 BASE) MCG/ACT inhaler Inhale 2 puffs into the lungs every 6 (six) hours as needed for wheezing or shortness of breath.   Yes Historical Provider, MD  ALPRAZolam Duanne Moron) 1 MG tablet Take 1 mg by mouth 2 (two) times daily as needed for anxiety.  02/10/14  Yes Historical Provider, MD  budesonide-formoterol (SYMBICORT) 160-4.5  MCG/ACT inhaler Inhale 2 puffs into the lungs 2 (two) times daily. 09/18/16  Yes Praveen Mannam, MD  buPROPion (WELLBUTRIN XL) 150 MG 24 hr tablet Take 450 mg by mouth daily.    Yes Historical Provider, MD  busPIRone (BUSPAR) 10 MG tablet Take 10 mg by mouth 2 (two) times daily.    Yes Historical Provider, MD  Cholecalciferol (VITAMIN D3) 2000 units TABS Take 2,000 Units by mouth daily.   Yes Historical Provider, MD  citalopram (CELEXA) 20 MG tablet Take 20 mg by mouth daily.   Yes Historical Provider, MD  folic acid (FOLVITE) 1 MG tablet Take 1 tablet (1 mg total) by mouth daily. 08/26/16  Yes [name], MD  furosemide (LASIX) 40 MG tablet Take 1 tablet (40 mg total) by mouth daily. 08/26/16  Yes [name], MD  levothyroxine (SYNTHROID, LEVOTHROID) 25 MCG tablet Take 25 mcg by mouth daily before breakfast.  08/31/14  Yes Historical Provider, MD  metolazone (ZAROXOLYN) 2.5 MG tablet Take 2.5 mg by mouth daily.   Yes Historical Provider, MD  omeprazole (PRILOSEC) 20 MG capsule Take 20 mg by mouth daily.   Yes Historical Provider, MD  oxyCODONE (ROXICODONE) 15 MG immediate release tablet Take 15 mg by mouth every 6 (six) hours as needed for pain.   Yes Historical Provider, MD  OXYGEN Inhale 2 L into the lungs as needed.   Yes Historical Provider, MD  potassium chloride SA (K-DUR,KLOR-CON) 20 MEQ tablet Take 1 tablet (20 mEq total) by mouth daily. 08/26/16  Yes [name], MD  primidone (MYSOLINE) 50 MG tablet Take 2 tablets (100 mg total) by mouth 3 (three) times daily. Patient taking differently: Take 50 mg by mouth 4 (four) times daily.  09/16/14  Yes Marcial Pacas, MD  traZODone (DESYREL) 150 MG tablet Take 300 mg by mouth at bedtime.  08/31/14  Yes Historical Provider, MD  Turmeric 500 MG TABS Take 500 mg by mouth daily.   Yes Historical Provider, MD  vitamin B-12 (CYANOCOBALAMIN) 1000 MCG tablet Take 1,000 mcg by mouth daily.   Yes Historical Provider, MD    Family History Family  History  Problem Relation Age of Onset  . Heart attack Father     Social History Social History  Substance Use Topics  . Smoking status: Former Smoker    Packs/day: 0.10    Years: 55.00    Types: Cigarettes    Quit date: 08/19/2016  . Smokeless tobacco: Former Systems developer    Quit date: 08/13/2016     Comment: Encouraged to remain smoke free  . Alcohol use No     Allergies   Cephalexin; Cymbalta [duloxetine hcl]; and Latex   Review of Systems Review of Systems  Constitutional: Negative for chills and fever.  HENT: Negative for congestion.   Eyes: Negative for visual disturbance.  Respiratory: Negative for shortness of breath.   Cardiovascular: Negative for chest pain.  Gastrointestinal: Negative for abdominal pain and vomiting.  Genitourinary: Negative for dysuria and  flank pain.  Musculoskeletal: Positive for arthralgias, back pain and gait problem. Negative for neck pain and neck stiffness.  Skin: Negative for rash.  Neurological: Negative for light-headedness and headaches.     Physical Exam Updated Vital Signs BP (!) 109/51   Pulse 91   Temp 98 F (36.7 C) (Oral)   Resp 13   SpO2 100%   Physical Exam  Constitutional: She is oriented to person, place, and time. She appears well-developed and well-nourished.  HENT:  Head: Normocephalic and atraumatic.  Dry mm  Eyes: Conjunctivae are normal. Right eye exhibits no discharge. Left eye exhibits no discharge.  Neck: Normal range of motion. Neck supple. No tracheal deviation present.  Cardiovascular: Normal rate and regular rhythm.   Pulmonary/Chest: Effort normal and breath sounds normal.  Abdominal: Soft. She exhibits no distension. There is no tenderness. There is no guarding.  Musculoskeletal: She exhibits tenderness. She exhibits no edema.  Patient has tenderness to lower lumbar midline paraspinal as well as mild tenderness to right hip posterior. Patient has full range of motion of hips knees and ankles without  significant tenderness except for the right hip. No shortening appreciated. No effusion appreciated. Patient has mild tenderness C7 midline and no thoracic tenderness.  Neurological: She is alert and oriented to person, place, and time. No cranial nerve deficit.  No focal weakness on exam sensation intact palpation bilateral.  Skin: Skin is warm. No rash noted.  Psychiatric: She has a normal mood and affect.  Nursing note and vitals reviewed.    ED Treatments / Results  Labs (all labs ordered are listed, but only abnormal results are displayed) Labs Reviewed  BASIC METABOLIC PANEL - Abnormal; Notable for the following:       Result Value   Sodium 131 (*)    Chloride 91 (*)    Glucose, Bld 103 (*)    Creatinine, Ser 1.08 (*)    Calcium 8.6 (*)    GFR calc non Af Amer 50 (*)    GFR calc Af Amer 58 (*)    All other components within normal limits  CBC WITH DIFFERENTIAL/PLATELET - Abnormal; Notable for the following:    RBC 3.68 (*)    Hemoglobin 10.5 (*)    HCT 32.5 (*)    All other components within normal limits  URINALYSIS, ROUTINE W REFLEX MICROSCOPIC  I-STAT CG4 LACTIC ACID, ED    EKG  EKG Interpretation None       Radiology Ct Cervical Spine Wo Contrast  Result Date: 11/03/2016 CLINICAL DATA:  Low back pain. Numbness and tingling radiating into the left leg since a fall 8 days ago. EXAM: CT CERVICAL SPINE WITHOUT CONTRAST TECHNIQUE: Multidetector CT imaging of the cervical spine was performed without intravenous contrast. Multiplanar CT image reconstructions were also generated. COMPARISON:  None. FINDINGS: Alignment: Straightening of normal lordosis. Minimal anterolisthesis of C4 versus C5 and C5 versus C6. There is no associated fracture and the findings are favored to be degenerative with degenerative changes at the facets at these levels. No traumatic malalignment identified. Skull base and vertebrae: No fractures. Soft tissues and spinal canal: No severe canal  narrowing. No obvious canal hematoma or injury. Atherosclerotic change in the carotid arteries. No other soft tissue abnormalities. Disc levels: Multilevel degenerative changes with small anterior and posterior osteophytes. Upper chest: Scarring in the apices. Emphysematous changes in the apices. Other: No other abnormalities. IMPRESSION: 1. Mild anterolisthesis of C4 versus C5 and C5 versus C6 is favored to be  degenerative with no adjacent soft tissue swelling or fracture identified. No traumatic malalignment or fracture is noted. Multilevel degenerative changes. Electronically Signed   By: Dorise Bullion III M.D   On: 11/03/2016 21:01   Ct Lumbar Spine Wo Contrast  Result Date: 11/03/2016 CLINICAL DATA:  71 year old female with lower back pain. Recent fall. Patient complaining of numbness and tingling radiating to the left leg. EXAM: CT LUMBAR SPINE WITHOUT CONTRAST TECHNIQUE: Multidetector CT imaging of the lumbar spine was performed without intravenous contrast administration. Multiplanar CT image reconstructions were also generated. COMPARISON:  Lumbar spine MRI dated 04/22/2011 FINDINGS: Segmentation: 5 lumbar type vertebrae. Alignment: Normal. Vertebrae: There is mild compression fracture of the superior endplate of the L2 vertebra with approximately 10-20% loss of vertebral body height. This is new since the MRI of 04/22/2011. This is age indeterminate and likely subacute or chronic. Acute fracture is less likely but not entirely excluded. Correlation with clinical exam and point tenderness recommended. There is no retropulsed fragment. No definite acute fracture identified. The bones are osteopenic. Paraspinal and other soft tissues: The paraspinal soft tissues appear unremarkable. There is advanced aortoiliac atherosclerotic disease. There is a 2.2 cm left adrenal adenoma. Probable 9 mm right adrenal adenoma. 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 There is disc desiccation with vacuum phenomena and L5-S1. Mild diffuse disc bulge noted at this level. IMPRESSION: 1. No definite acute fracture or subluxation of the lumbar spine. Mild compression deformity of the superior endplate of the L2 vertebra, likely subacute or chronic. An acute fracture is less likely. Correlation with clinical exam and point tenderness recommended. This is however new compared to the MRI of 2012. 2. 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 3. Bilateral adrenal adenomas, left greater right 4. Atherosclerotic disease of the aorta. Electronically Signed   By: Anner Crete M.D.   On: 11/03/2016 21:14   Ct Pelvis Wo Contrast  Result Date: 11/03/2016 CLINICAL DATA:  71 year old female with back pain. Recent fall and trauma to the buttock area. EXAM: CT PELVIS WITHOUT CONTRAST TECHNIQUE: Multidetector CT imaging of the pelvis was performed following the standard protocol without intravenous contrast. COMPARISON:  Lumbar spine CT dated 11/03/2016 and abdominal CT dated 06/29/2010 FINDINGS: Evaluation of this exam is limited in the absence of intravenous contrast. Urinary Tract: The visualized distal ureters and urinary bladder appear unremarkable. Bowel: There is sigmoid diverticulosis without active inflammatory changes. Moderate stool noted in the visualized distal colon. No bowel dilatation noted within the pelvis. The appendix is normal. Vascular/Lymphatic: Atherosclerotic calcification of the distal aorta. There is no adenopathy. Reproductive:  Hysterectomy. Other:  None Musculoskeletal: Osteopenia. There is focal area of sclerotic changes involving the left sacral alum which is new compared to the CT of 2011. A  nondisplaced cortical fracture or bone contusion is not excluded. No displaced fracture identified. Disc desiccation with vacuum phenomena at L5-S1. There is osteoarthritic changes of the hips bilaterally. IMPRESSION: 1. Sclerotic changes of the inferior left sacral alum may represent nondisplaced fracture or bone contusion. No displaced acute fracture identified. 2. Sigmoid diverticulosis without active inflammation. Electronically Signed   By: Anner Crete M.D.   On: 11/03/2016 21:22  Procedures Procedures (including critical care time)  Medications Ordered in ED Medications  fentaNYL (SUBLIMAZE) injection 50 mcg (50 mcg Intravenous Given 11/03/16 2024)  sodium chloride 0.9 % bolus 500 mL (500 mLs Intravenous New Bag/Given 11/03/16 2247)     Initial Impression / Assessment and Plan / ED Course  I have reviewed the triage vital signs and the nursing notes.  Pertinent labs & imaging results that were available during my care of the patient were reviewed by me and considered in my medical decision making (see chart for details).    Patient presents with persistent pain since fall last Friday. Patient has had multiple falls and we discussed important to follow up with primary Dr. and likely neurology outpatient. Plan for x-rays to look for fracture. With history of arthritis and age and persistent pain x-rays changed to CT scans for further delineation.  CT scan results reviewed no significant fracture visualized. Patient can ambulate. Concern is that patient has had low blood pressure in the ER. Plan for lactic acid, urinalysis and IV and oral fluids. Low blood pressure may be contributing to why patient has had more frequent falls recently. Patient is on a number of different medications that will need review with primary doctor. No specific blood pressure medicine that we can hold except lasix.  Patient feels well on reassessment. Blood pressure still lower however family patient says her  blood pressure is always running low. Discussed oral and IV fluids and monitoring in the ER. Urinalysis. Lactic acid normal. Patient will need close follow-up outpatient and will hold Lasix at this time.  Results and differential diagnosis were discussed with the patient/parent/guardian. Xrays were independently reviewed by myself.  Close follow up outpatient was discussed, comfortable with the plan.   Medications  fentaNYL (SUBLIMAZE) injection 50 mcg (50 mcg Intravenous Given 11/03/16 2024)  sodium chloride 0.9 % bolus 500 mL (500 mLs Intravenous New Bag/Given 11/03/16 2247)    Vitals:   11/03/16 2130 11/03/16 2200 11/03/16 2245 11/03/16 2315  BP: (!) 104/46 (!) 94/42 (!) 112/40 (!) 109/51  Pulse: 90 89 98 91  Resp:   (!) 26 13  Temp:      TempSrc:      SpO2: 99% 100% 99% 100%    Final diagnoses:  Lower back pain  Frequent falls  Right hip pain  Other specified hypotension     Final Clinical Impressions(s) / ED Diagnoses   Final diagnoses:  Lower back pain  Frequent falls  Right hip pain  Other specified hypotension    New Prescriptions New Prescriptions   No medications on file     Elnora Morrison, MD 11/03/16 2333

## 2016-11-03 NOTE — ED Triage Notes (Signed)
Pt presents with c/o lower back pain. The pain began after she fell landing on her buttocks/lower back 8 days ago. She c/o numbness and tingling, and pain radiating into her L leg and mid back. She has also had trouble moving her bowels since the fall. She has been able to ambulate with her walker. She has been taking percocet with temporary pain relief.

## 2016-11-03 NOTE — ED Notes (Signed)
Patient transported to X-ray 

## 2016-11-04 LAB — URINALYSIS, ROUTINE W REFLEX MICROSCOPIC
BILIRUBIN URINE: NEGATIVE
Glucose, UA: NEGATIVE mg/dL
KETONES UR: NEGATIVE mg/dL
Leukocytes, UA: NEGATIVE
NITRITE: NEGATIVE
Protein, ur: NEGATIVE mg/dL
SPECIFIC GRAVITY, URINE: 1.009 (ref 1.005–1.030)
pH: 6 (ref 5.0–8.0)

## 2016-12-17 ENCOUNTER — Ambulatory Visit: Payer: Medicare Other | Admitting: Internal Medicine

## 2016-12-17 ENCOUNTER — Encounter: Payer: Self-pay | Admitting: *Deleted

## 2017-01-11 ENCOUNTER — Telehealth: Payer: Self-pay | Admitting: Internal Medicine

## 2017-01-11 NOTE — Telephone Encounter (Signed)
New message  Pt call stating she was returning RN call. Please call back if needed.

## 2017-01-11 NOTE — Telephone Encounter (Signed)
Returned call to patient. No outgoing nursing calls. An appointment was scheduled for her on 01/10/17 after 5pm to see B. Strader, PA on 6/5. She states she would prefer to see MD and was unaware of this appointment. Cancelled 6/5 appt and r/s for 6/27 w/Dr. Debara Pickett.

## 2017-01-17 ENCOUNTER — Encounter: Payer: Self-pay | Admitting: Pulmonary Disease

## 2017-01-17 ENCOUNTER — Ambulatory Visit (INDEPENDENT_AMBULATORY_CARE_PROVIDER_SITE_OTHER): Payer: Medicare Other | Admitting: Pulmonary Disease

## 2017-01-17 VITALS — BP 118/52 | HR 90 | Ht 69.0 in | Wt 152.0 lb

## 2017-01-17 DIAGNOSIS — J449 Chronic obstructive pulmonary disease, unspecified: Secondary | ICD-10-CM | POA: Diagnosis not present

## 2017-01-17 MED ORDER — BUDESONIDE-FORMOTEROL FUMARATE 160-4.5 MCG/ACT IN AERO: 2.0000 | INHALATION_SPRAY | Freq: Two times a day (BID) | RESPIRATORY_TRACT | 3 refills | Status: DC

## 2017-01-17 MED ORDER — ALBUTEROL SULFATE HFA 108 (90 BASE) MCG/ACT IN AERS: 2.0000 | INHALATION_SPRAY | Freq: Four times a day (QID) | RESPIRATORY_TRACT | 2 refills | Status: DC | PRN

## 2017-01-17 NOTE — Patient Instructions (Signed)
Continue using the Symbicort. Remember to take 2 puffs twice a day for maximum effect Continue using the albuterol rescue inhaler We will send in a prescription with refills for inhalers Congratulations on quitting smoking. It's important that you do not go back to smoking  Return to clinic in 4 months.

## 2017-01-17 NOTE — Progress Notes (Signed)
Traci [name]    [id number]    [birth year]  Primary Care Physician:Haque, Rosalyn Charters, MD  Referring Physician: Raelyn Number, MD [address] [place], [place] [address]  Chief complaint:   Follow up for severe COPD  HPI: Traci [name] is a 71 year old with past medical history of hypertension, hyperlipidemia, anxiety, heavy active smoking, tremors. She has complains of worsening dyspnea at rest and activity for the past 3-4 years. She has chronic cough with daily sputum production, daily wheezing. She is just on albuterol when necessary which she uses up to 2-3 times every day. She denies any nighttime awakenings, chest pain, palpitations, fevers, chills. She has back, spine pain when she coughs.  She is a 50-pack-year smoking history. She continues to smoke although she is trying to quit and has cut down to 3 cigarettes per day. She used to work in a [employer] and has been exposed to fiber and dust. There are no known exposures to asbestos. She was admitted in the end of December for respiratory failure secondary to CHF exacerbation. Chest x-ray showed pleural effusions and edema. CT of the chest during this admission was negative for PE. Cardiology was consulted and she was diuresed with improvement in symptoms.   Interim history: Utibron inhaler was changed to symbicort at last visit. She reports stable symptoms with chronic cough sputum production, dyspnea on exertion. She hasn't had any exacerbations or ER visits since last assessment. She is using the Symbicort only once daily. She continues on supplemental oxygen. She has quit smoking successfully since January 2018.  Outpatient Encounter Prescriptions as of 01/17/2017  Medication Sig  . albuterol (PROVENTIL HFA;VENTOLIN HFA) 108 (90 BASE) MCG/ACT inhaler Inhale 2 puffs into the lungs every 6 (six) hours as needed for wheezing or shortness of breath.  . ALPRAZolam (XANAX) 1 MG tablet Take 1 mg by mouth 2 (two) times  daily as needed for anxiety.   . budesonide-formoterol (SYMBICORT) 160-4.5 MCG/ACT inhaler Inhale 2 puffs into the lungs 2 (two) times daily.  Marland Kitchen buPROPion (WELLBUTRIN XL) 150 MG 24 hr tablet Take 450 mg by mouth daily.   . busPIRone (BUSPAR) 10 MG tablet Take 10 mg by mouth 2 (two) times daily.   . Cholecalciferol (VITAMIN D3) 2000 units TABS Take 2,000 Units by mouth daily.  . citalopram (CELEXA) 20 MG tablet Take 20 mg by mouth daily.  . folic acid (FOLVITE) 1 MG tablet Take 1 tablet (1 mg total) by mouth daily.  . furosemide (LASIX) 40 MG tablet Take 1 tablet (40 mg total) by mouth daily.  Marland Kitchen levothyroxine (SYNTHROID, LEVOTHROID) 25 MCG tablet Take 25 mcg by mouth daily before breakfast.   . metolazone (ZAROXOLYN) 2.5 MG tablet Take 2.5 mg by mouth daily.  Marland Kitchen omeprazole (PRILOSEC) 20 MG capsule Take 20 mg by mouth daily.  Marland Kitchen oxyCODONE (ROXICODONE) 15 MG immediate release tablet Take 15 mg by mouth every 6 (six) hours as needed for pain.  . OXYGEN Inhale 2 L into the lungs as needed.  . potassium chloride SA (K-DUR,KLOR-CON) 20 MEQ tablet Take 1 tablet (20 mEq total) by mouth daily.  . primidone (MYSOLINE) 50 MG tablet Take 2 tablets (100 mg total) by mouth 3 (three) times daily. (Patient taking differently: Take 50 mg by mouth 4 (four) times daily. )  . traZODone (DESYREL) 150 MG tablet Take 300 mg by mouth at bedtime.   . Turmeric 500 MG TABS Take 500 mg by  mouth daily.  . vitamin B-12 (CYANOCOBALAMIN) 1000 MCG tablet Take 1,000 mcg by mouth daily.   No facility-administered encounter medications on file as of 01/17/2017.     Allergies as of 01/17/2017 - Review Complete 01/17/2017  Allergen Reaction Noted  . Cephalexin Hives   . Cymbalta [duloxetine hcl] Other (See Comments) 07/13/2014  . Latex  03/26/2014    Past Medical History:  Diagnosis Date  . Anginal pain ([place])   . Anxiety   . Arthritis   . Chronic bronchitis ([place])   . Chronic lower back pain   . COPD (chronic obstructive  pulmonary disease) ([place])   . Depression   . Dyspepsia   . GERD (gastroesophageal reflux disease)   . H/O hiatal hernia   . YHCWCBJS(283.1)    "1-2/week" (08/16/2016)  . History of blood transfusion 1963   w/childbirth  . Hyperlipidemia   . Hypertension   . Hypothyroidism   . Shortness of breath 02/12/12   "all the time"  . Tremor     Past Surgical History:  Procedure Laterality Date  . BACK SURGERY    . CARDIAC CATHETERIZATION  04/19/1995   EF 65-70%  . CATARACT EXTRACTION W/ INTRAOCULAR LENS  IMPLANT, BILATERAL  2012  . FRACTURE SURGERY    . LAPAROSCOPIC CHOLECYSTECTOMY  2010  . LEFT HEART CATHETERIZATION WITH CORONARY ANGIOGRAM N/A 02/13/2012   Procedure: LEFT HEART CATHETERIZATION WITH CORONARY ANGIOGRAM;  Surgeon: Thayer Headings, MD;  Location: [facility] CATH LAB;  Service: Cardiovascular;  Laterality: N/A;  . LUMBAR DISC SURGERY  early 2000's  . TEE WITHOUT CARDIOVERSION N/A 08/22/2016   Procedure: TRANSESOPHAGEAL ECHOCARDIOGRAM (TEE);  Surgeon: Sanda Klein, MD;  Location: [facility];  Service: Cardiovascular;  Laterality: N/A;  . TIBIA IM NAIL INSERTION Right 03/30/2014   Procedure: INTRAMEDULLARY (IM) NAIL TIBIAL;  Surgeon: Wylene Simmer, MD;  Location: [place] [facility];  Service: Orthopedics;  Laterality: Right;  . US ECHOCARDIOGRAPHY  12/17/2007   EF 55-60%  . VAGINAL HYSTERECTOMY  1980's    Family History  Problem Relation Age of Onset  . Heart attack Father     Social History   Social History  . Marital status: Married    Spouse name: Traci [name]  . Number of children: 2  . Years of education: GED   Occupational History  . Not on file.   Social History Main Topics  . Smoking status: Former Smoker    Packs/day: 0.10    Years: 55.00    Types: Cigarettes    Quit date: 08/19/2016  . Smokeless tobacco: Former Systems developer    Quit date: 08/13/2016     Comment: Encouraged to remain smoke free  . Alcohol use No  . Drug use: No  . Sexual activity: No   Other Topics Concern  . Not  on file   Social History Narrative   Patient lives at home with her husband [facility]). Two children.   Retired.   Education GED.   Right handed.   Caffeine Four cups of coffee daily.   Review of systems: Review of Systems  Constitutional: Negative for fever and chills.  HENT: Negative.   Eyes: Negative for blurred vision.  Respiratory: as per HPI  Cardiovascular: Negative for chest pain and palpitations.  Gastrointestinal: Negative for vomiting, diarrhea, blood per rectum. Genitourinary: Negative for dysuria, urgency, frequency and hematuria.  Musculoskeletal: Negative for myalgias, back pain and joint pain.  Skin: Negative for itching and rash.  Neurological: Negative for dizziness, tremors, focal weakness, seizures and loss  of consciousness.  Endo/Heme/Allergies: Negative for environmental allergies.  Psychiatric/Behavioral: Negative for depression, suicidal ideas and hallucinations.  All other systems reviewed and are negative.  Physical Exam: Blood pressure (!) 118/52, pulse 90, height 5\' 9"  (1.753 m), weight 152 lb (68.9 kg), SpO2 91 %. Gen:      No acute distress HEENT:  EOMI, sclera anicteric Neck:     No masses; no thyromegaly Lungs:    Clear to auscultation bilaterally; normal respiratory effort CV:         Regular rate and rhythm; no murmurs Abd:      + bowel sounds; soft, non-tender; no palpable masses, no distension Ext:    No edema; adequate peripheral perfusion Skin:      Warm and dry; no rash Neuro: alert and oriented x 3 Psych: normal mood and affect  Data Reviewed: Chest x-ray 04/18/14-mild venous congestion, left midlung stable calcified granuloma. CT chest 05/21/09-no pulmonary medicine, emphysema, calcified left upper lobe granuloma CT chest 03/30/08-emphysema, calcified left upper lobe granuloma Chest x-ray 01/03/05-left upper lobe granuloma. CT 08/16/16- no PE, moderate mitral pleural effusions, pulmonary edema, emphysema. There is focal  consolidation/capacity Screening CT 09/24/16- 4.4 mm lingular nodule. Left upper lobe granuloma.. All images personally reviewed.  PFTs 09/18/16 FVC 2.46 (69%) FEV1 1.36 (50%) F/F 55 TLC 165% RV/TLC 170% DLCO 34% Severe obstruction with air trapping, DLCO impairment. Positive bronchodilator response  Echocardiogram 08/17/16 Normal RV size and systolic function. There   was moderate aortic insufficiency, this may not have been fully   visualized. The mitral valve was not well-visualized. By doppler   indices, there is moderate mitral stenosis, ?rheumatic. Would   suggest TEE to more closely visualized the valves. There was mild   pulmonary hypertension.  Assessment:  #1 Severe COPD PFTs were reviewed with her. They show severe obstruction. She is currently on Symbicort with stable symptoms. She is using her inhaler only once a day. I told her to start using Symbicort 2 puffs twice daily. She'll continue on albuterol rescue inhaler and supplemental oxygen. We'll need to check him that his oxygen on return visit.  #2 Abnormal CT Left upper lobe Granuloma Stable since 2006. She'll continue low-dose screening CTs of the chest  #3 Ex Smoker.  Quit smoking since January 2018. I congratulated her and encouraged her to keep off cigarettes.    Plan/Recommendations: - Continue symbicort, continue albuterol rescue inhaler. - Low dose screening CT  Marshell Garfinkel MD [facility] Pager [phone] 01/17/2017, 2:21 PM  CC: Raelyn Number, MD

## 2017-01-22 ENCOUNTER — Telehealth: Payer: Self-pay | Admitting: Pulmonary Disease

## 2017-01-22 ENCOUNTER — Ambulatory Visit: Payer: Medicare Other | Admitting: Student

## 2017-01-22 MED ORDER — BUDESONIDE-FORMOTEROL FUMARATE 160-4.5 MCG/ACT IN AERO: 2.0000 | INHALATION_SPRAY | Freq: Two times a day (BID) | RESPIRATORY_TRACT | 2 refills | Status: DC

## 2017-01-22 MED ORDER — ALBUTEROL SULFATE HFA 108 (90 BASE) MCG/ACT IN AERS: 2.0000 | INHALATION_SPRAY | Freq: Four times a day (QID) | RESPIRATORY_TRACT | 2 refills | Status: AC | PRN

## 2017-01-22 NOTE — Telephone Encounter (Signed)
rxs both re-sent  Pt aware  Nothing further needed per pt

## 2017-01-28 ENCOUNTER — Telehealth: Payer: Self-pay | Admitting: Internal Medicine

## 2017-01-28 NOTE — Telephone Encounter (Signed)
Traci Munro RN case manager with [employer], states that patient is enrolled in [employer]. Patient states that she was feeling more "breathless" than usual. Patient is not sure what her fluid restrictions.   Patient picked up two patient pounds in two days.  Please call patient to discuss, thanks.

## 2017-01-28 NOTE — Telephone Encounter (Signed)
Spoke with pt, she reports SOB with little exertion for 1 to 2 months now but states it is her normal. She has no edema and her weight is up 3 lbs in 3 days. She is watching her salt and fluid restrictions discussed with the patient. She is taking metolazone and furosemide daily/ she will continue to monitor and if weight not down by Wednesday she will call us back.

## 2017-02-13 ENCOUNTER — Ambulatory Visit (INDEPENDENT_AMBULATORY_CARE_PROVIDER_SITE_OTHER): Payer: Medicare Other | Admitting: Internal Medicine

## 2017-02-13 ENCOUNTER — Encounter: Payer: Self-pay | Admitting: Internal Medicine

## 2017-02-13 VITALS — BP 106/58 | HR 78 | Ht 69.0 in | Wt 152.8 lb

## 2017-02-13 DIAGNOSIS — I5033 Acute on chronic diastolic (congestive) heart failure: Secondary | ICD-10-CM | POA: Diagnosis not present

## 2017-02-13 DIAGNOSIS — I080 Rheumatic disorders of both mitral and aortic valves: Secondary | ICD-10-CM

## 2017-02-13 DIAGNOSIS — I10 Essential (primary) hypertension: Secondary | ICD-10-CM | POA: Diagnosis not present

## 2017-02-13 DIAGNOSIS — Z66 Do not resuscitate: Secondary | ICD-10-CM | POA: Diagnosis not present

## 2017-02-13 NOTE — Patient Instructions (Addendum)
Your physician wants you to follow-up in: 6 months with Dr. Debara Pickett. You will receive a reminder letter in the mail two months in advance. If you don't receive a letter, please call our office to schedule the follow-up appointment.  Please notify our office if you gain 3lbs in 24 hours or 5lbs in 1 week Per Dr. Debara Pickett - OK to take extra lasix daily for a few days as needed for swelling

## 2017-02-13 NOTE — Progress Notes (Signed)
OFFICE NOTE  Chief Complaint:  Shortness of breath  Primary Care Physician: Raelyn Number, MD  HPI:  Traci [name] is a 71 y.o. female former patient of Dr. Mare Ferrari (last seen in 2013) who is scheduled to see me for the first time today. She was recently seen in the hospital by my partner Dr. Marlou Porch. Her last heart catheterization was in June 2013 which she underwent coronary angiography and had nonobstructive coronary disease with normal LV function. She also has moderate to severe COPD, hypertension, anxiety and depression. She was admitted just after Christmas with progressive exertional dyspnea and right-sided chest discomfort. There is associated nonproductive cough. Findings were consistent with COPD/diastolic CHF exacerbation. Echo was performed which is a technically difficult study showing grade 2 diastolic dysfunction at least moderate aortic insufficiency and moderate mitral valve stenosis, possibly rheumatic. She then underwent TEE on 08/22/2016, which demonstrated EF of 60-65%, moderate to severe aortic insufficiency and a rheumatic mitral valve with diastolic leaflet doming and moderate stenosis with moderate to severe regurgitation. Calculated valve area was 1.14 cm. RVSP is 39 mmHg. She was diuresed and on discharge weight was around 160 pounds. She reports her weight at home is about 152 pounds and her weight today in the office was 159 pounds. She denies any history of lower extremity edema or abdominal swelling although occasionally feels some bloating. She has been on oxygen since discharge she says helps her the most but her breathing has not improved back to anywhere near her baseline.  02/13/2017  Traci [name] returns today for follow-up. She reports worsening shortness of breath. She's seeing Dr. Collene Mares him for COPD which is considered to be severe. She was found to have dramatic two-vessel valvular disease with moderate to severe AI and at least moderate rheumatic mitral  valve stenosis. EF was normal 60-65%. Her weight continues to decline. She reports decreased appetite. There is no evidence for worsening swelling and she remains on a diuretic. I reviewed her images and feel that is unlikely with her significant lung disease that she is a candidate for valve replacement surgery. Based on this the options are quite limited for her. I suspect that ultimately she will develop progressive worsening valvular heart disease leading heart failure and will either be admitted or this could be fatal. We did talk about some end-of-life issues today. Namely whether or not she wanted to be DO NOT RESUSCITATE. She did indicate that she's proves to discuss this with her family would not want to be resuscitated. Based on that we completed a yellow MOLST form today in the office which she was provided and I recommended she place a prominently in her residence.  PMHx:  Past Medical History:  Diagnosis Date  . Anginal pain ([place])   . Anxiety   . Arthritis   . Chronic bronchitis ([place])   . Chronic lower back pain   . COPD (chronic obstructive pulmonary disease) ([place])   . Depression   . Dyspepsia   . GERD (gastroesophageal reflux disease)   . H/O hiatal hernia   . VFIEPPIR(518.8)    "1-2/week" (08/16/2016)  . History of blood transfusion 1963   w/childbirth  . Hyperlipidemia   . Hypertension   . Hypothyroidism   . Shortness of breath 02/12/12   "all the time"  . Tremor     Past Surgical History:  Procedure Laterality Date  . BACK SURGERY    . CARDIAC CATHETERIZATION  04/19/1995   EF 65-70%  .  CATARACT EXTRACTION W/ INTRAOCULAR LENS  IMPLANT, BILATERAL  2012  . FRACTURE SURGERY    . LAPAROSCOPIC CHOLECYSTECTOMY  2010  . LEFT HEART CATHETERIZATION WITH CORONARY ANGIOGRAM N/A 02/13/2012   Procedure: LEFT HEART CATHETERIZATION WITH CORONARY ANGIOGRAM;  Surgeon: Thayer Headings, MD;  Location: [facility] CATH LAB;  Service: Cardiovascular;  Laterality: N/A;  . LUMBAR DISC SURGERY   early 2000's  . TEE WITHOUT CARDIOVERSION N/A 08/22/2016   Procedure: TRANSESOPHAGEAL ECHOCARDIOGRAM (TEE);  Surgeon: Sanda Klein, MD;  Location: [place] [facility];  Service: Cardiovascular;  Laterality: N/A;  . TIBIA IM NAIL INSERTION Right 03/30/2014   Procedure: INTRAMEDULLARY (IM) NAIL TIBIAL;  Surgeon: Wylene Simmer, MD;  Location: [place];  Service: Orthopedics;  Laterality: Right;  . US ECHOCARDIOGRAPHY  12/17/2007   EF 55-60%  . VAGINAL HYSTERECTOMY  1980's    FAMHx:  Family History  Problem Relation Age of Onset  . Heart attack Father     SOCHx:   reports that she quit smoking about 5 months ago. Her smoking use included Cigarettes. She has a 5.50 pack-year smoking history. She quit smokeless tobacco use about 6 months ago. She reports that she does not drink alcohol or use drugs.  ALLERGIES:  Allergies  Allergen Reactions  . Cephalexin Hives  . Cymbalta [Duloxetine Hcl] Other (See Comments)    Sees spiders  . Latex     " MAKES ME ITCHY "     ROS: Pertinent items noted in HPI and remainder of comprehensive ROS otherwise negative.  HOME MEDS: Current Outpatient Prescriptions on File Prior to Visit  Medication Sig Dispense Refill  . albuterol (PROVENTIL HFA;VENTOLIN HFA) 108 (90 Base) MCG/ACT inhaler Inhale 2 puffs into the lungs every 6 (six) hours as needed for wheezing or shortness of breath. 1 Inhaler 2  . ALPRAZolam (XANAX) 1 MG tablet Take 1 mg by mouth 2 (two) times daily as needed for anxiety.     . budesonide-formoterol (SYMBICORT) 160-4.5 MCG/ACT inhaler Inhale 2 puffs into the lungs 2 (two) times daily. 1 Inhaler 2  . buPROPion (WELLBUTRIN XL) 150 MG 24 hr tablet Take 450 mg by mouth daily.     . busPIRone (BUSPAR) 10 MG tablet Take 10 mg by mouth 2 (two) times daily.     . Cholecalciferol (VITAMIN D3) 2000 units TABS Take 2,000 Units by mouth daily.    . folic acid (FOLVITE) 1 MG tablet Take 1 tablet (1 mg total) by mouth daily. 30 tablet 0  . furosemide (LASIX) 40  MG tablet Take 1 tablet (40 mg total) by mouth daily. 30 tablet 1  . levothyroxine (SYNTHROID, LEVOTHROID) 25 MCG tablet Take 25 mcg by mouth daily before breakfast.     . metolazone (ZAROXOLYN) 2.5 MG tablet Take 2.5 mg by mouth daily.    Marland Kitchen omeprazole (PRILOSEC) 20 MG capsule Take 20 mg by mouth daily.    Marland Kitchen oxyCODONE (ROXICODONE) 15 MG immediate release tablet Take 15 mg by mouth every 6 (six) hours as needed for pain.    . OXYGEN Inhale 2 L into the lungs as needed.    . potassium chloride SA (K-DUR,KLOR-CON) 20 MEQ tablet Take 1 tablet (20 mEq total) by mouth daily. 30 tablet 0  . traZODone (DESYREL) 150 MG tablet Take 300 mg by mouth at bedtime.     . vitamin B-12 (CYANOCOBALAMIN) 1000 MCG tablet Take 1,000 mcg by mouth daily.     No current facility-administered medications on file prior to visit.  LABS/IMAGING: No results found for this or any previous visit (from the past 48 hour(s)). No results found.  WEIGHTS: Wt Readings from Last 3 Encounters:  02/13/17 152 lb 12.8 oz (69.3 kg)  01/17/17 152 lb (68.9 kg)  09/14/16 159 lb 12.8 oz (72.5 kg)    VITALS: BP (!) 106/58   Pulse 78   Ht 5\' 9"  (1.753 m)   Wt 152 lb 12.8 oz (69.3 kg)   BMI 22.56 kg/m   EXAM: General appearance: alert and mild distress Neck: no carotid bruit and no JVD Lungs: diminished breath sounds bilaterally and Diffusely and On 2 L nasal cannula oxygen Heart: regular rate and rhythm Abdomen: soft, non-tender; bowel sounds normal; no masses,  no organomegaly Extremities: extremities normal, atraumatic, no cyanosis or edema Pulses: 2+ and symmetric Skin: Skin color, texture, turgor normal. No rashes or lesions Neurologic: Alert and oriented X 3, normal strength and tone. Normal symmetric reflexes. Normal coordination and gait Psych: Pleasant  EKG: Normal sinus rhythm with sinus arrhythmia at 78  ASSESSMENT: 1. 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 Moderate mitral stenosis with thickening and cord involvement 3. Severe COPD on continuous oxygen 4. Acute on chronic diastolic congestive heart failure- NYHA class III symptoms  PLAN: 1.   Traci [name] had recent acute on chronic diastolic congestive heart failure with severe AI and moderate to severe MR and moderate mitral stenosis. She has severe COPD and continuous oxygen and class 3-4 heart failure symptoms. Unfortunately with her significant lung disease she would not be a candidate for valve replacement. I discussed this with her length today and I feel that we should pursue medical therapy and a more comfort approach if she were to decline further. We discussed some end-of-life issues and what her wishes would be. She says she spoken with her family are ready and determined that she did not want to be resuscitated. Based on this I helped her to complete a yellow MOLST DNR form today in the office. She does have chronic pain and is on narcotics per her primary care provider. It may be appropriate to consider involving palliative care.  Follow-up in 6 months.   Pixie Casino, MD, [facility] Attending Cardiologist [place] C Miriah Maruyama 02/13/2017, 4:15 PM

## 2017-05-07 ENCOUNTER — Ambulatory Visit (INDEPENDENT_AMBULATORY_CARE_PROVIDER_SITE_OTHER): Payer: Medicare Other | Admitting: Pulmonary Disease

## 2017-05-07 ENCOUNTER — Encounter: Payer: Self-pay | Admitting: Pulmonary Disease

## 2017-05-07 VITALS — BP 124/70 | HR 88 | Ht 69.0 in | Wt 145.2 lb

## 2017-05-07 DIAGNOSIS — Z23 Encounter for immunization: Secondary | ICD-10-CM

## 2017-05-07 DIAGNOSIS — J449 Chronic obstructive pulmonary disease, unspecified: Secondary | ICD-10-CM

## 2017-05-07 NOTE — Progress Notes (Addendum)
Traci [name]    [id number]    Jan 29, 1946  Primary Care Physician:Haque, Rosalyn Charters, MD  Referring Physician: Raelyn Number, MD [address] [place], [place] [address]  Chief complaint:   Follow up for COPD GOLD B   HPI: Traci [name] is a 71 year old with past medical history of hypertension, hyperlipidemia, anxiety, heavy active smoking, tremors. She has complains of worsening dyspnea at rest and activity for the past 3-4 years. She has chronic cough with daily sputum production, daily wheezing. She is just on albuterol when necessary which she uses up to 2-3 times every day. She denies any nighttime awakenings, chest pain, palpitations, fevers, chills. She has back, spine pain when she coughs.  She is a 50-pack-year smoking history. She continues to smoke although she is trying to quit and has cut down to 3 cigarettes per day. She used to work in a [employer] and has been exposed to fiber and dust. There are no known exposures to asbestos. She was admitted in the end of December for respiratory failure secondary to CHF exacerbation. Chest x-ray showed pleural effusions and edema. CT of the chest during this admission was negative for PE. Cardiology was consulted and she was diuresed with improvement in symptoms.   Flu vaccine 2018 PCV13 2018  Interim history: She continues on supplemental oxygen. Symptoms are stable on Symbicort. She is using albuterol 1-2 times every day. She will follow-up with Dr. 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 She is not a candidate for valve replacement. She had a course of care discussion and is currently DNR. She has quit smoking successfully since January 2018.  Outpatient Encounter Prescriptions as of 05/07/2017  Medication Sig  . albuterol (PROVENTIL HFA;VENTOLIN HFA) 108 (90 Base) MCG/ACT inhaler Inhale 2 puffs into the lungs every 6 (six) hours as needed for wheezing or shortness of breath.  . ALPRAZolam (XANAX) 1 MG tablet Take  1 mg by mouth 2 (two) times daily as needed for anxiety.   . budesonide-formoterol (SYMBICORT) 160-4.5 MCG/ACT inhaler Inhale 2 puffs into the lungs 2 (two) times daily.  Marland Kitchen buPROPion (WELLBUTRIN XL) 150 MG 24 hr tablet Take 450 mg by mouth daily.   . busPIRone (BUSPAR) 10 MG tablet Take 10 mg by mouth 2 (two) times daily.   . Cholecalciferol (VITAMIN D3) 2000 units TABS Take 2,000 Units by mouth daily.  . folic acid (FOLVITE) 1 MG tablet Take 1 tablet (1 mg total) by mouth daily.  . furosemide (LASIX) 40 MG tablet Take 1 tablet (40 mg total) by mouth daily.  . hydrOXYzine (VISTARIL) 25 MG capsule   . levothyroxine (SYNTHROID, LEVOTHROID) 25 MCG tablet Take 25 mcg by mouth daily before breakfast.   . metolazone (ZAROXOLYN) 2.5 MG tablet Take 2.5 mg by mouth daily.  Marland Kitchen omeprazole (PRILOSEC) 20 MG capsule Take 20 mg by mouth daily.  Marland Kitchen oxyCODONE (ROXICODONE) 15 MG immediate release tablet Take 15 mg by mouth every 6 (six) hours as needed for pain.  . OXYGEN Inhale 2 L into the lungs as needed.  . potassium chloride SA (K-DUR,KLOR-CON) 20 MEQ tablet Take 1 tablet (20 mEq total) by mouth daily.  . pramipexole (MIRAPEX) 0.25 MG tablet   . traZODone (DESYREL) 150 MG tablet Take 300 mg by mouth at bedtime.   . vitamin B-12 (CYANOCOBALAMIN) 1000 MCG tablet Take 1,000 mcg by mouth daily.   No facility-administered encounter medications on file as of 05/07/2017.  Allergies as of 05/07/2017 - Review Complete 05/07/2017  Allergen Reaction Noted  . Cephalexin Hives   . Cymbalta [duloxetine hcl] Other (See Comments) 07/13/2014  . Latex  03/26/2014    Past Medical History:  Diagnosis Date  . Anginal pain ([place])   . Anxiety   . Arthritis   . Chronic bronchitis ([place])   . Chronic lower back pain   . COPD (chronic obstructive pulmonary disease) ([place])   . Depression   . Dyspepsia   . GERD (gastroesophageal reflux disease)   . H/O hiatal hernia   . QMVHQION(629.5)    "1-2/week" (08/16/2016)  .  History of blood transfusion 1963   w/childbirth  . Hyperlipidemia   . Hypertension   . Hypothyroidism   . Shortness of breath 02/12/12   "all the time"  . Tremor     Past Surgical History:  Procedure Laterality Date  . BACK SURGERY    . CARDIAC CATHETERIZATION  04/19/1995   EF 65-70%  . CATARACT EXTRACTION W/ INTRAOCULAR LENS  IMPLANT, BILATERAL  2012  . FRACTURE SURGERY    . LAPAROSCOPIC CHOLECYSTECTOMY  2010  . LEFT HEART CATHETERIZATION WITH CORONARY ANGIOGRAM N/A 02/13/2012   Procedure: LEFT HEART CATHETERIZATION WITH CORONARY ANGIOGRAM;  Surgeon: Thayer Headings, MD;  Location: [facility] CATH LAB;  Service: Cardiovascular;  Laterality: N/A;  . LUMBAR DISC SURGERY  early 2000's  . TEE WITHOUT CARDIOVERSION N/A 08/22/2016   Procedure: TRANSESOPHAGEAL ECHOCARDIOGRAM (TEE);  Surgeon: Sanda Klein, MD;  Location: [facility];  Service: Cardiovascular;  Laterality: N/A;  . TIBIA IM NAIL INSERTION Right 03/30/2014   Procedure: INTRAMEDULLARY (IM) NAIL TIBIAL;  Surgeon: Wylene Simmer, MD;  Location: [facility];  Service: Orthopedics;  Laterality: Right;  . US ECHOCARDIOGRAPHY  12/17/2007   EF 55-60%  . VAGINAL HYSTERECTOMY  1980's    Family History  Problem Relation Age of Onset  . Heart attack Father     Social History   Social History  . Marital status: Married    Spouse name: Mannie  . Number of children: 2  . Years of education: GED   Occupational History  . Not on file.   Social History Main Topics  . Smoking status: Former Smoker    Packs/day: 0.10    Years: 55.00    Types: Cigarettes    Quit date: 08/19/2016  . Smokeless tobacco: Former Systems developer    Quit date: 08/13/2016     Comment: Encouraged to remain smoke free  . Alcohol use No  . Drug use: No  . Sexual activity: No   Other Topics Concern  . Not on file   Social History Narrative   Patient lives at home with her husband [facility]). Two children.   Retired.   Education GED.   Right handed.   Caffeine Four cups of  coffee daily.   Review of systems: Review of Systems  Constitutional: Negative for fever and chills.  HENT: Negative.   Eyes: Negative for blurred vision.  Respiratory: as per HPI  Cardiovascular: Negative for chest pain and palpitations.  Gastrointestinal: Negative for vomiting, diarrhea, blood per rectum. Genitourinary: Negative for dysuria, urgency, frequency and hematuria.  Musculoskeletal: Negative for myalgias, back pain and joint pain.  Skin: Negative for itching and rash.  Neurological: Negative for dizziness, tremors, focal weakness, seizures and loss of consciousness.  Endo/Heme/Allergies: Negative for environmental allergies.  Psychiatric/Behavioral: Negative for depression, suicidal ideas and hallucinations.  All other systems reviewed and are negative.  Physical Exam: Blood pressure 124/70,  pulse 88, height 5\' 9"  (1.753 m), weight 145 lb 3.2 oz (65.9 kg), SpO2 92 %. Gen:      No acute distress HEENT:  EOMI, sclera anicteric Neck:     No masses; no thyromegaly Lungs:    Clear to auscultation bilaterally; normal respiratory effort CV:         Regular rate and rhythm; no murmurs Abd:      + bowel sounds; soft, non-tender; no palpable masses, no distension Ext:    No edema; adequate peripheral perfusion Skin:      Warm and dry; no rash Neuro: alert and oriented x 3 Psych: normal mood and affect  Data Reviewed: Chest x-ray 04/18/14-mild venous congestion, left midlung stable calcified granuloma. CT chest 05/21/09-no pulmonary medicine, emphysema, calcified left upper lobe granuloma CT chest 03/30/08-emphysema, calcified left upper lobe granuloma Chest x-ray 01/03/05-left upper lobe granuloma. CT 08/16/16- no PE, moderate mitral pleural effusions, pulmonary edema, emphysema. There is focal consolidation/capacity Screening CT 09/24/16- 4.4 mm lingular nodule. Left upper lobe granuloma.. All images personally reviewed.  PFTs 09/18/16 FVC 2.46 (69%) FEV1 1.36 (50%) F/F 55 TLC  165% RV/TLC 170% DLCO 34% Severe obstruction with air trapping, DLCO impairment. Positive bronchodilator response  Echocardiogram 08/17/16 Normal RV size and systolic function. There   was moderate aortic insufficiency, this may not have been fully   visualized. The mitral valve was not well-visualized. By doppler   indices, there is moderate mitral stenosis, ?rheumatic. Would   suggest TEE to more closely visualized the valves. There was mild   pulmonary hypertension.  Assessment:  #1 Severe COPD PFTs show severe obstruction. She is currently on Symbicort with stable symptoms.  Continue supplemental O2. Check O2 sats with ambulation at next visit.  She will get the flu vaccine and PCV 13 today. Give PPSV23 in 1 year.   #2 Abnormal CT Left upper lobe Granuloma Stable since 2006. She'll continue low-dose screening CTs of the chest  #3 Ex Smoker.  Quit smoking since January 2018. I congratulated her and encouraged her to keep off cigarettes.    #4 Code status DNR  Plan/Recommendations: - Continue symbicort, continue albuterol rescue inhaler. - Low dose screening CT - Flu vaccine and PCV 13 - DNR  Marshell Garfinkel MD [facility] Pager [phone] 05/07/2017, 1:30 PM  CC: Raelyn Number, MD

## 2017-05-07 NOTE — Addendum Note (Signed)
Addended by: Maryanna Shape A on: 05/07/2017 02:00 PM   Modules accepted: Orders

## 2017-05-07 NOTE — Patient Instructions (Signed)
Please continue using inhalers as prescribed We will give a pneumonia vaccine and a flu vaccine today Follow up in 6 months.

## 2017-07-30 ENCOUNTER — Ambulatory Visit: Payer: Medicare Other | Admitting: Pulmonary Disease

## 2017-08-12 DIAGNOSIS — K219 Gastro-esophageal reflux disease without esophagitis: Secondary | ICD-10-CM | POA: Insufficient documentation

## 2017-08-12 DIAGNOSIS — I10 Essential (primary) hypertension: Secondary | ICD-10-CM | POA: Insufficient documentation

## 2017-08-12 DIAGNOSIS — E785 Hyperlipidemia, unspecified: Secondary | ICD-10-CM | POA: Insufficient documentation

## 2017-08-12 DIAGNOSIS — J449 Chronic obstructive pulmonary disease, unspecified: Secondary | ICD-10-CM | POA: Insufficient documentation

## 2017-08-12 DIAGNOSIS — F419 Anxiety disorder, unspecified: Secondary | ICD-10-CM | POA: Insufficient documentation

## 2017-08-12 DIAGNOSIS — I209 Angina pectoris, unspecified: Secondary | ICD-10-CM | POA: Insufficient documentation

## 2017-08-12 DIAGNOSIS — G8929 Other chronic pain: Secondary | ICD-10-CM | POA: Insufficient documentation

## 2017-08-12 DIAGNOSIS — R1013 Epigastric pain: Secondary | ICD-10-CM | POA: Insufficient documentation

## 2017-08-12 DIAGNOSIS — F329 Major depressive disorder, single episode, unspecified: Secondary | ICD-10-CM | POA: Insufficient documentation

## 2017-08-12 DIAGNOSIS — M545 Low back pain, unspecified: Secondary | ICD-10-CM | POA: Insufficient documentation

## 2017-08-12 DIAGNOSIS — Z8719 Personal history of other diseases of the digestive system: Secondary | ICD-10-CM | POA: Insufficient documentation

## 2017-08-12 DIAGNOSIS — M1990 Unspecified osteoarthritis, unspecified site: Secondary | ICD-10-CM | POA: Insufficient documentation

## 2017-08-12 DIAGNOSIS — F32A Depression, unspecified: Secondary | ICD-10-CM | POA: Insufficient documentation

## 2017-08-12 DIAGNOSIS — R251 Tremor, unspecified: Secondary | ICD-10-CM | POA: Insufficient documentation

## 2017-08-12 DIAGNOSIS — J42 Unspecified chronic bronchitis: Secondary | ICD-10-CM | POA: Insufficient documentation

## 2017-08-19 ENCOUNTER — Ambulatory Visit (INDEPENDENT_AMBULATORY_CARE_PROVIDER_SITE_OTHER): Payer: Medicare Other | Admitting: Internal Medicine

## 2017-08-19 ENCOUNTER — Encounter: Payer: Self-pay | Admitting: Internal Medicine

## 2017-08-19 VITALS — BP 134/64 | HR 87 | Ht 69.0 in | Wt 149.0 lb

## 2017-08-19 DIAGNOSIS — I10 Essential (primary) hypertension: Secondary | ICD-10-CM | POA: Diagnosis not present

## 2017-08-19 DIAGNOSIS — I252 Old myocardial infarction: Secondary | ICD-10-CM

## 2017-08-19 DIAGNOSIS — I080 Rheumatic disorders of both mitral and aortic valves: Secondary | ICD-10-CM | POA: Diagnosis not present

## 2017-08-19 DIAGNOSIS — I5033 Acute on chronic diastolic (congestive) heart failure: Secondary | ICD-10-CM

## 2017-08-19 NOTE — Patient Instructions (Signed)
Medication Instructions: Your physician recommends that you continue on your current medications as directed. Please refer to the Current Medication list given to you today.  If you need a refill on your cardiac medications before your next appointment, please call your pharmacy.    Procedures/Testing: Your physician has requested that you have an echocardiogram in 6 months. Echocardiography is a painless test that uses sound waves to create images of your heart. It provides your doctor with information about the size and shape of your heart and how well your heart's chambers and valves are working. This procedure takes approximately one hour. There are no restrictions for this procedure. This will take place at [address], [address]   Follow-Up: Your physician wants you to follow-up in: 6 months after the Echo has been completed. You will receive a reminder letter in the mail two months in advance. If you don't receive a letter, please call our office at [phone] to schedule this follow-up appointment.   Thank you for choosing [facility] at [facility]!!

## 2017-08-19 NOTE — Progress Notes (Signed)
OFFICE NOTE  Chief Complaint:  No new complaints  Primary Care Physician: Raelyn Number, MD  HPI:  Traci [name] is a 71 y.o. female former patient of Dr. Mare Ferrari (last seen in 2013) who is scheduled to see me for the first time today. She was recently seen in the hospital by my partner Dr. Marlou Porch. Her last heart catheterization was in June 2013 which she underwent coronary angiography and had nonobstructive coronary disease with normal LV function. She also has moderate to severe COPD, hypertension, anxiety and depression. She was admitted just after Christmas with progressive exertional dyspnea and right-sided chest discomfort. There is associated nonproductive cough. Findings were consistent with COPD/diastolic CHF exacerbation. Echo was performed which is a technically difficult study showing grade 2 diastolic dysfunction at least moderate aortic insufficiency and moderate mitral valve stenosis, possibly rheumatic. She then underwent TEE on 08/22/2016, which demonstrated EF of 60-65%, moderate to severe aortic insufficiency and a rheumatic mitral valve with diastolic leaflet doming and moderate stenosis with moderate to severe regurgitation. Calculated valve area was 1.14 cm. RVSP is 39 mmHg. She was diuresed and on discharge weight was around 160 pounds. She reports her weight at home is about 152 pounds and her weight today in the office was 159 pounds. She denies any history of lower extremity edema or abdominal swelling although occasionally feels some bloating. She has been on oxygen since discharge she says helps her the most but her breathing has not improved back to anywhere near her baseline.  02/13/2017  Traci [name] returns today for follow-up. She reports worsening shortness of breath. She's seeing Dr. Collene Mares him for COPD which is considered to be severe. She was found to have dramatic two-vessel valvular disease with moderate to severe AI and at least moderate rheumatic mitral  valve stenosis. EF was normal 60-65%. Her weight continues to decline. She reports decreased appetite. There is no evidence for worsening swelling and she remains on a diuretic. I reviewed her images and feel that is unlikely with her significant lung disease that she is a candidate for valve replacement surgery. Based on this the options are quite limited for her. I suspect that ultimately she will develop progressive worsening valvular heart disease leading heart failure and will either be admitted or this could be fatal. We did talk about some end-of-life issues today. Namely whether or not she wanted to be DO NOT RESUSCITATE. She did indicate that she's proves to discuss this with her family would not want to be resuscitated. Based on that we completed a yellow MOLST form today in the office which she was provided and I recommended she place a prominently in her residence.  08/19/2017  Traci [name] was seen today in follow-up.  She reports fairly stable COPD and heart failure symptoms.  As previously mentioned she has moderate to severe AI and moderate MS/MR and is not considered a valve surgery candidate based on her severe COPD.  Medical therapy is recommended.  We seem to be maintaining euvolemic status on her Lasix.  Her weight is actually down a few pounds.  Appetite is not great.  She did follow-up with her pulmonologist who made no changes to her medications.  PMHx:  Past Medical History:  Diagnosis Date  . Anginal pain ([place])   . Anxiety   . Arthritis   . Chronic bronchitis ([place])   . Chronic lower back pain   . COPD (chronic obstructive pulmonary disease) ([place])   . Depression   .  Dyspepsia   . GERD (gastroesophageal reflux disease)   . H/O hiatal hernia   . HALPFXTK(240.9)    "1-2/week" (08/16/2016)  . History of blood transfusion 1963   w/childbirth  . Hyperlipidemia   . Hypertension   . Hypothyroidism   . Shortness of breath 02/12/12   "all the time"  . Tremor     Past  Surgical History:  Procedure Laterality Date  . BACK SURGERY    . CARDIAC CATHETERIZATION  04/19/1995   EF 65-70%  . CATARACT EXTRACTION W/ INTRAOCULAR LENS  IMPLANT, BILATERAL  2012  . FRACTURE SURGERY    . LAPAROSCOPIC CHOLECYSTECTOMY  2010  . LEFT HEART CATHETERIZATION WITH CORONARY ANGIOGRAM N/A 02/13/2012   Procedure: LEFT HEART CATHETERIZATION WITH CORONARY ANGIOGRAM;  Surgeon: Thayer Headings, MD;  Location: [facility] CATH LAB;  Service: Cardiovascular;  Laterality: N/A;  . LUMBAR DISC SURGERY  early 2000's  . TEE WITHOUT CARDIOVERSION N/A 08/22/2016   Procedure: TRANSESOPHAGEAL ECHOCARDIOGRAM (TEE);  Surgeon: Sanda Klein, MD;  Location: [facility];  Service: Cardiovascular;  Laterality: N/A;  . TIBIA IM NAIL INSERTION Right 03/30/2014   Procedure: INTRAMEDULLARY (IM) NAIL TIBIAL;  Surgeon: Wylene Simmer, MD;  Location: [facility];  Service: Orthopedics;  Laterality: Right;  . US ECHOCARDIOGRAPHY  12/17/2007   EF 55-60%  . VAGINAL HYSTERECTOMY  1980's    FAMHx:  Family History  Problem Relation Age of Onset  . Heart attack Father   . Obesity Sister   . Emphysema Sister   . Heart disease Brother     SOCHx:   reports that she quit smoking about a year ago. Her smoking use included cigarettes. She has a 5.50 pack-year smoking history. She quit smokeless tobacco use about a year ago. She reports that she does not drink alcohol or use drugs.  ALLERGIES:  Allergies  Allergen Reactions  . Cephalexin Hives  . Cymbalta [Duloxetine Hcl] Other (See Comments)    Sees spiders  . Latex     " MAKES ME ITCHY "     ROS: Pertinent items noted in HPI and remainder of comprehensive ROS otherwise negative.  HOME MEDS: Current Outpatient Medications on File Prior to Visit  Medication Sig Dispense Refill  . albuterol (PROVENTIL HFA;VENTOLIN HFA) 108 (90 Base) MCG/ACT inhaler Inhale 2 puffs into the lungs every 6 (six) hours as needed for wheezing or shortness of breath. 1 Inhaler 2  . ALPRAZolam  (XANAX) 1 MG tablet Take 1 mg by mouth 2 (two) times daily as needed for anxiety.     . budesonide-formoterol (SYMBICORT) 160-4.5 MCG/ACT inhaler Inhale 2 puffs into the lungs 2 (two) times daily. 1 Inhaler 2  . buPROPion (WELLBUTRIN XL) 150 MG 24 hr tablet Take 450 mg by mouth daily.     . busPIRone (BUSPAR) 10 MG tablet Take 10 mg by mouth 2 (two) times daily.     . Cholecalciferol (VITAMIN D3) 2000 units TABS Take 2,000 Units by mouth daily.    . folic acid (FOLVITE) 1 MG tablet Take 1 tablet (1 mg total) by mouth daily. 30 tablet 0  . furosemide (LASIX) 40 MG tablet Take 1 tablet (40 mg total) by mouth daily. 30 tablet 1  . hydrOXYzine (VISTARIL) 25 MG capsule     . levothyroxine (SYNTHROID, LEVOTHROID) 25 MCG tablet Take 25 mcg by mouth daily before breakfast.     . metolazone (ZAROXOLYN) 2.5 MG tablet Take 2.5 mg by mouth daily.    Marland Kitchen omeprazole (PRILOSEC) 20 MG capsule  Take 20 mg by mouth daily.    Marland Kitchen oxyCODONE (ROXICODONE) 15 MG immediate release tablet Take 15 mg by mouth every 6 (six) hours as needed for pain.    . OXYGEN Inhale 2 L into the lungs as needed.    . potassium chloride SA (K-DUR,KLOR-CON) 20 MEQ tablet Take 1 tablet (20 mEq total) by mouth daily. 30 tablet 0  . pramipexole (MIRAPEX) 0.25 MG tablet     . traZODone (DESYREL) 150 MG tablet Take 300 mg by mouth at bedtime.     . vitamin B-12 (CYANOCOBALAMIN) 1000 MCG tablet Take 1,000 mcg by mouth daily.     No current facility-administered medications on file prior to visit.     LABS/IMAGING: No results found for this or any previous visit (from the past 48 hour(s)). No results found.  WEIGHTS: Wt Readings from Last 3 Encounters:  08/19/17 149 lb (67.6 kg)  05/07/17 145 lb 3.2 oz (65.9 kg)  02/13/17 152 lb 12.8 oz (69.3 kg)    VITALS: BP 134/64   Pulse 87   Ht 5\' 9"  (1.753 m)   Wt 149 lb (67.6 kg)   BMI 22.00 kg/m   EXAM: General appearance: alert and mild distress Neck: no carotid bruit and no JVD Lungs:  diminished breath sounds bilaterally and Diffusely and On 2 L nasal cannula oxygen Heart: regular rate and rhythm Abdomen: soft, non-tender; bowel sounds normal; no masses,  no organomegaly Extremities: extremities normal, atraumatic, no cyanosis or edema Pulses: 2+ and symmetric Skin: Skin color, texture, turgor normal. No rashes or lesions Neurologic: Alert and oriented X 3, normal strength and tone. Normal symmetric reflexes. Normal coordination and gait Psych: Pleasant  EKG: Normal sinus rhythm 87, nonspecific ST changes-personally reviewed  ASSESSMENT: 1. 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 Moderate mitral stenosis with thickening and cord involvement 3. Severe COPD on continuous oxygen 4. Acute on chronic diastolic congestive heart failure- NYHA class III symptoms 5. DNR  PLAN: 1.   Traci [name] has significant valvular heart disease and is not a candidate for surgery.  She has COPD which is severe and continuous oxygen.  Her volume status appears to be stable on her current dose of diuretic.  She is DNR.  We will need to keep an eye on her valvular heart disease and is reasonable to consider a repeat echo for prognostic value in 6 months.  Follow-up in 6 months.   Pixie Casino, MD, [facility], [place] Director of the [facility]  [facility] Attending Cardiologist  Direct Dial: [phone]  Fax: [phone]  Website:  [email or web address] 08/19/2017, 3:04 PM

## 2017-09-05 ENCOUNTER — Ambulatory Visit: Payer: Medicare Other | Admitting: Pulmonary Disease

## 2017-09-13 ENCOUNTER — Ambulatory Visit (INDEPENDENT_AMBULATORY_CARE_PROVIDER_SITE_OTHER): Payer: Medicare Other | Admitting: Pulmonary Disease

## 2017-09-13 ENCOUNTER — Encounter: Payer: Self-pay | Admitting: Pulmonary Disease

## 2017-09-13 VITALS — BP 108/64 | HR 92 | Ht 69.0 in | Wt 149.0 lb

## 2017-09-13 DIAGNOSIS — J449 Chronic obstructive pulmonary disease, unspecified: Secondary | ICD-10-CM

## 2017-09-13 MED ORDER — FLUTICASONE-UMECLIDIN-VILANT 100-62.5-25 MCG/INH IN AEPB: 1.0000 | INHALATION_SPRAY | Freq: Every day | RESPIRATORY_TRACT | 0 refills | Status: AC

## 2017-09-13 MED ORDER — FLUTICASONE-UMECLIDIN-VILANT 100-62.5-25 MCG/INH IN AEPB: 1.0000 | INHALATION_SPRAY | Freq: Every day | RESPIRATORY_TRACT | 6 refills | Status: DC

## 2017-09-13 NOTE — Patient Instructions (Signed)
We will stop the Symbicort and give you another inhaler called trelegy that you use once a day Continue the albuterol as needed Continue supplemental oxygen Follow-up in 6 months.

## 2017-09-13 NOTE — Progress Notes (Signed)
Traci [name]    [id number]    Aug 16, 1946  Primary Care Physician:Haque, Rosalyn Charters, MD  Referring Physician: Raelyn Number, MD [address] [place], [place] [address]  Chief complaint:   Follow up for COPD GOLD B   HPI: Mrs. Traci [name] is a 72 year old with past medical history of hypertension, hyperlipidemia, anxiety, heavy active smoking, tremors. She has complains of worsening dyspnea at rest and activity for the past 3-4 years. She has chronic cough with daily sputum production, daily wheezing. She is just on albuterol when necessary which she uses up to 2-3 times every day. She denies any nighttime awakenings, chest pain, palpitations, fevers, chills. She has back, spine pain when she coughs.  She is a 50-pack-year smoking history. She continues to smoke although she is trying to quit and has cut down to 3 cigarettes per day. She used to work in a [employer] and has been exposed to fiber and dust. There are no known exposures to asbestos. She was admitted in the end of December for respiratory failure secondary to CHF exacerbation. Chest x-ray showed pleural effusions and edema. CT of the chest during this admission was negative for PE. Cardiology was consulted and she was diuresed with improvement in symptoms.   She will follow-up with Dr. 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 She is not a candidate for valve replacement. She had a goals of care discussion and is currently DNR. She has quit smoking successfully since January 2018.  Flu vaccine 2018 PCV13 2018, needs PPSV23 in 1 year.   Interim history: Continues on supplemental oxygen and Symbicort.  Reports worsening dyspnea on exertion.  She has episodes of worsening dyspnea with no aggravating factors.  Denies any cough, sputum production, wheezing.  She has not had any exacerbations or ER visits.   Outpatient Encounter Medications as of 09/13/2017  Medication Sig  . albuterol (PROVENTIL HFA;VENTOLIN HFA) 108  (90 Base) MCG/ACT inhaler Inhale 2 puffs into the lungs every 6 (six) hours as needed for wheezing or shortness of breath.  . budesonide-formoterol (SYMBICORT) 160-4.5 MCG/ACT inhaler Inhale 2 puffs into the lungs 2 (two) times daily.  Marland Kitchen buPROPion (WELLBUTRIN XL) 150 MG 24 hr tablet Take 450 mg by mouth daily.   . busPIRone (BUSPAR) 10 MG tablet Take 10 mg by mouth 2 (two) times daily.   . Cholecalciferol (VITAMIN D3) 2000 units TABS Take 2,000 Units by mouth daily.  . folic acid (FOLVITE) 1 MG tablet Take 1 tablet (1 mg total) by mouth daily.  . furosemide (LASIX) 40 MG tablet Take 1 tablet (40 mg total) by mouth daily.  . hydrOXYzine (ATARAX/VISTARIL) 25 MG tablet Take 25 mg by mouth every 8 (eight) hours as needed.  Marland Kitchen levothyroxine (SYNTHROID, LEVOTHROID) 25 MCG tablet Take 25 mcg by mouth daily before breakfast.   . metolazone (ZAROXOLYN) 2.5 MG tablet Take 2.5 mg by mouth daily.  Marland Kitchen omeprazole (PRILOSEC) 20 MG capsule Take 20 mg by mouth daily.  Marland Kitchen oxyCODONE (ROXICODONE) 15 MG immediate release tablet Take 15 mg by mouth every 6 (six) hours as needed for pain.  . OXYGEN Inhale 2 L into the lungs as needed.  . potassium chloride SA (K-DUR,KLOR-CON) 20 MEQ tablet Take 1 tablet (20 mEq total) by mouth daily.  . pramipexole (MIRAPEX) 0.25 MG tablet   . traZODone (DESYREL) 150 MG tablet Take 300 mg by mouth at bedtime.   . vitamin B-12 (CYANOCOBALAMIN) 1000 MCG  tablet Take 1,000 mcg by mouth daily.  . [DISCONTINUED] ALPRAZolam (XANAX) 1 MG tablet Take 1 mg by mouth 2 (two) times daily as needed for anxiety.   . [DISCONTINUED] hydrOXYzine (VISTARIL) 25 MG capsule    No facility-administered encounter medications on file as of 09/13/2017.     Allergies as of 09/13/2017 - Review Complete 09/13/2017  Allergen Reaction Noted  . Cephalexin Hives   . Cymbalta [duloxetine hcl] Other (See Comments) 07/13/2014  . Latex  03/26/2014    Past Medical History:  Diagnosis Date  . Anginal pain ([place])   .  Anxiety   . Arthritis   . Chronic bronchitis ([place])   . Chronic lower back pain   . COPD (chronic obstructive pulmonary disease) ([place])   . Depression   . Dyspepsia   . GERD (gastroesophageal reflux disease)   . H/O hiatal hernia   . OEHOZYYQ(825.0)    "1-2/week" (08/16/2016)  . History of blood transfusion 1963   w/childbirth  . Hyperlipidemia   . Hypertension   . Hypothyroidism   . Shortness of breath 02/12/12   "all the time"  . Tremor     Past Surgical History:  Procedure Laterality Date  . BACK SURGERY    . CARDIAC CATHETERIZATION  04/19/1995   EF 65-70%  . CATARACT EXTRACTION W/ INTRAOCULAR LENS  IMPLANT, BILATERAL  2012  . FRACTURE SURGERY    . LAPAROSCOPIC CHOLECYSTECTOMY  2010  . LEFT HEART CATHETERIZATION WITH CORONARY ANGIOGRAM N/A 02/13/2012   Procedure: LEFT HEART CATHETERIZATION WITH CORONARY ANGIOGRAM;  Surgeon: Thayer Headings, MD;  Location: [facility] CATH LAB;  Service: Cardiovascular;  Laterality: N/A;  . LUMBAR DISC SURGERY  early 2000's  . TEE WITHOUT CARDIOVERSION N/A 08/22/2016   Procedure: TRANSESOPHAGEAL ECHOCARDIOGRAM (TEE);  Surgeon: Sanda Klein, MD;  Location: [facility];  Service: Cardiovascular;  Laterality: N/A;  . TIBIA IM NAIL INSERTION Right 03/30/2014   Procedure: INTRAMEDULLARY (IM) NAIL TIBIAL;  Surgeon: Wylene Simmer, MD;  Location: [facility];  Service: Orthopedics;  Laterality: Right;  . US ECHOCARDIOGRAPHY  12/17/2007   EF 55-60%  . VAGINAL HYSTERECTOMY  1980's    Family History  Problem Relation Age of Onset  . Heart attack Father   . Obesity Sister   . Emphysema Sister   . Heart disease Brother     Social History   Socioeconomic History  . Marital status: Married    Spouse name: Mannie  . Number of children: 2  . Years of education: GED  . Highest education level: Not on file  Social Needs  . Financial resource strain: Not on file  . Food insecurity - worry: Not on file  . Food insecurity - inability: Not on file  . Transportation  needs - medical: Not on file  . Transportation needs - non-medical: Not on file  Occupational History  . Not on file  Tobacco Use  . Smoking status: Former Smoker    Packs/day: 0.10    Years: 55.00    Pack years: 5.50    Types: Cigarettes    Last attempt to quit: 08/19/2016    Years since quitting: 1.0  . Smokeless tobacco: Former Systems developer    Quit date: 08/13/2016  . Tobacco comment: Encouraged to remain smoke free  Substance and Sexual Activity  . Alcohol use: No    Alcohol/week: 0.0 oz  . Drug use: No  . Sexual activity: No  Other Topics Concern  . Not on file  Social History Narrative  Patient lives at home with her husband [facility]). Two children.   Retired.   Education GED.   Right handed.   Caffeine Four cups of coffee daily.   Review of systems: Review of Systems  Constitutional: Negative for fever and chills.  HENT: Negative.   Eyes: Negative for blurred vision.  Respiratory: as per HPI  Cardiovascular: Negative for chest pain and palpitations.  Gastrointestinal: Negative for vomiting, diarrhea, blood per rectum. Genitourinary: Negative for dysuria, urgency, frequency and hematuria.  Musculoskeletal: Negative for myalgias, back pain and joint pain.  Skin: Negative for itching and rash.  Neurological: Negative for dizziness, tremors, focal weakness, seizures and loss of consciousness.  Endo/Heme/Allergies: Negative for environmental allergies.  Psychiatric/Behavioral: Negative for depression, suicidal ideas and hallucinations.  All other systems reviewed and are negative.  Physical Exam: Blood pressure 108/64, pulse 92, height 5\' 9"  (1.753 m), weight 149 lb (67.6 kg), SpO2 94 %. Gen:      No acute distress HEENT:  EOMI, sclera anicteric Neck:     No masses; no thyromegaly Lungs:    Clear to auscultation bilaterally; normal respiratory effort CV:         Regular rate and rhythm; no murmurs Abd:      + bowel sounds; soft, non-tender; no palpable masses, no  distension Ext:    No edema; adequate peripheral perfusion Skin:      Warm and dry; no rash Neuro: alert and oriented x 3 Psych: normal mood and affect  Data Reviewed: Chest x-ray 04/18/14-mild venous congestion, left midlung stable calcified granuloma. CT chest 05/21/09-no pulmonary medicine, emphysema, calcified left upper lobe granuloma CT chest 03/30/08-emphysema, calcified left upper lobe granuloma Chest x-ray 01/03/05-left upper lobe granuloma. CT 08/16/16- no PE, moderate mitral pleural effusions, pulmonary edema, emphysema. There is focal consolidation/capacity Screening CT 09/24/16- 4.4 mm lingular nodule. Left upper lobe granuloma.. All images personally reviewed.  PFTs 09/18/16 FVC 2.46 (69%), FEV1 1.36 (50%), F/F 55, TLC 165%, RV/TLC 170%, DLCO 34% Severe obstruction with air trapping, DLCO impairment. Positive bronchodilator response  Echocardiogram 08/17/16 Normal RV size and systolic function. There   was moderate aortic insufficiency, this may not have been fully   visualized. The mitral valve was not well-visualized. By doppler   indices, there is moderate mitral stenosis, ?rheumatic. Would   suggest TEE to more closely visualized the valves. There was mild   pulmonary hypertension.  Assessment:  #1 COPD, Gold B PFTs show severe obstruction with bronchodilator response She reports mildly worsened symptoms.  We can optimize her inhalers further by stopping Symbicort and starting trelegy inhaler Continue supplemental O2.   #2 Abnormal CT Left upper lobe Granuloma Stable since 2006. She'll continue low-dose screening CTs of the chest  #3 Ex Smoker.  Quit smoking since January 2018. I congratulated her and encouraged her to keep off cigarettes.    #4 Code status DNR  Plan/Recommendations: - Stop Symbicort, start trelegy.  Continue albuterol rescue inhaler - Low dose screening CT - DNR  Marshell Garfinkel MD [facility] Pager [phone]  [phone] 09/13/2017, 3:12 PM  CC: Raelyn Number, MD

## 2017-09-13 NOTE — Addendum Note (Signed)
Addended byMarshell Garfinkel on: 09/13/2017 03:35 PM   Modules accepted: Level of Service

## 2017-10-03 ENCOUNTER — Ambulatory Visit (INDEPENDENT_AMBULATORY_CARE_PROVIDER_SITE_OTHER)
Admission: RE | Admit: 2017-10-03 | Discharge: 2017-10-03 | Disposition: A | Discharge status: Home / Self Care | Payer: Medicare Other | Source: Ambulatory Visit | Attending: Acute Care | Admitting: Acute Care

## 2017-10-03 DIAGNOSIS — Z87891 Personal history of nicotine dependence: Secondary | ICD-10-CM

## 2017-10-08 ENCOUNTER — Other Ambulatory Visit: Payer: Self-pay | Admitting: Acute Care

## 2017-10-08 DIAGNOSIS — Z87891 Personal history of nicotine dependence: Secondary | ICD-10-CM

## 2017-10-08 DIAGNOSIS — Z122 Encounter for screening for malignant neoplasm of respiratory organs: Secondary | ICD-10-CM

## 2018-01-09 ENCOUNTER — Other Ambulatory Visit: Payer: Self-pay | Admitting: Neurosurgery

## 2018-01-09 DIAGNOSIS — M542 Cervicalgia: Secondary | ICD-10-CM

## 2018-01-09 DIAGNOSIS — R251 Tremor, unspecified: Secondary | ICD-10-CM

## 2018-01-20 ENCOUNTER — Ambulatory Visit
Admission: RE | Admit: 2018-01-20 | Discharge: 2018-01-20 | Disposition: A | Discharge status: Home / Self Care | Payer: Medicare Other | Source: Ambulatory Visit | Attending: Neurosurgery | Admitting: Neurosurgery

## 2018-01-20 DIAGNOSIS — M542 Cervicalgia: Secondary | ICD-10-CM

## 2018-01-20 DIAGNOSIS — R251 Tremor, unspecified: Secondary | ICD-10-CM

## 2018-01-23 ENCOUNTER — Other Ambulatory Visit (HOSPITAL_COMMUNITY): Payer: Medicare Other

## 2018-01-24 ENCOUNTER — Other Ambulatory Visit: Payer: Self-pay

## 2018-01-24 ENCOUNTER — Ambulatory Visit (HOSPITAL_COMMUNITY): Payer: Medicare Other | Attending: Internal Medicine

## 2018-01-24 DIAGNOSIS — I509 Heart failure, unspecified: Secondary | ICD-10-CM | POA: Diagnosis not present

## 2018-01-24 DIAGNOSIS — I050 Rheumatic mitral stenosis: Secondary | ICD-10-CM | POA: Insufficient documentation

## 2018-01-24 DIAGNOSIS — E785 Hyperlipidemia, unspecified: Secondary | ICD-10-CM | POA: Diagnosis not present

## 2018-01-24 DIAGNOSIS — R0600 Dyspnea, unspecified: Secondary | ICD-10-CM | POA: Diagnosis not present

## 2018-01-24 DIAGNOSIS — I252 Old myocardial infarction: Secondary | ICD-10-CM | POA: Insufficient documentation

## 2018-01-24 DIAGNOSIS — Z87891 Personal history of nicotine dependence: Secondary | ICD-10-CM | POA: Insufficient documentation

## 2018-01-24 DIAGNOSIS — R000 Tachycardia, unspecified: Secondary | ICD-10-CM | POA: Diagnosis not present

## 2018-01-24 DIAGNOSIS — J449 Chronic obstructive pulmonary disease, unspecified: Secondary | ICD-10-CM | POA: Insufficient documentation

## 2018-01-24 DIAGNOSIS — I051 Rheumatic mitral insufficiency: Secondary | ICD-10-CM | POA: Diagnosis not present

## 2018-01-24 DIAGNOSIS — I10 Essential (primary) hypertension: Secondary | ICD-10-CM

## 2018-01-24 DIAGNOSIS — I110 Hypertensive heart disease with heart failure: Secondary | ICD-10-CM | POA: Insufficient documentation

## 2018-02-04 ENCOUNTER — Encounter: Payer: Self-pay | Admitting: Internal Medicine

## 2018-02-04 ENCOUNTER — Ambulatory Visit (INDEPENDENT_AMBULATORY_CARE_PROVIDER_SITE_OTHER): Payer: Medicare Other | Admitting: Internal Medicine

## 2018-02-04 VITALS — BP 100/60 | HR 76 | Ht 69.0 in | Wt 155.6 lb

## 2018-02-04 DIAGNOSIS — I5033 Acute on chronic diastolic (congestive) heart failure: Secondary | ICD-10-CM

## 2018-02-04 DIAGNOSIS — I351 Nonrheumatic aortic (valve) insufficiency: Secondary | ICD-10-CM | POA: Insufficient documentation

## 2018-02-04 DIAGNOSIS — Z66 Do not resuscitate: Secondary | ICD-10-CM | POA: Diagnosis not present

## 2018-02-04 DIAGNOSIS — I10 Essential (primary) hypertension: Secondary | ICD-10-CM | POA: Diagnosis not present

## 2018-02-04 NOTE — Patient Instructions (Addendum)
Sliding scale Lasix:  Weigh yourself when you get home, then daily in the morning. Your dry weight will be what your scale says on the day you return home (your weight here is 155 lbs.).   If you gain more than 3 pounds from dry weight: Increase the Lasix dosing to 40 mg in the morning and 40 mg in the afternoon until weight returns to baseline dry weight.  If weight gain is greater than 5 pounds in 2 days: Increase to Lasix 40 mg twice a day and contact the office for further assistance if weight does not go down the next day.  If the weight goes down more than 3 pounds from dry weight: Hold Lasix until it returns to baseline dry weight  You have been referred to valve clinic. Theodosia Quay, RN will contact you about appointments.   Your physician wants you to follow-up in: 6 months with Dr. Debara Pickett. You will receive a reminder letter in the mail two months in advance. If you don't receive a letter, please call our office to schedule the follow-up appointment.

## 2018-02-04 NOTE — Progress Notes (Signed)
OFFICE NOTE  Chief Complaint:  No new complaints  Primary Care Physician: Raelyn Number, MD  HPI:  Traci [name] is a 72 y.o. female former patient of Dr. Mare Ferrari (last seen in 2013) who is scheduled to see me for the first time today. She was recently seen in the hospital by my partner Dr. Marlou Porch. Her last heart catheterization was in June 2013 which she underwent coronary angiography and had nonobstructive coronary disease with normal LV function. She also has moderate to severe COPD, hypertension, anxiety and depression. She was admitted just after Christmas with progressive exertional dyspnea and right-sided chest discomfort. There is associated nonproductive cough. Findings were consistent with COPD/diastolic CHF exacerbation. Echo was performed which is a technically difficult study showing grade 2 diastolic dysfunction at least moderate aortic insufficiency and moderate mitral valve stenosis, possibly rheumatic. She then underwent TEE on 08/22/2016, which demonstrated EF of 60-65%, moderate to severe aortic insufficiency and a rheumatic mitral valve with diastolic leaflet doming and moderate stenosis with moderate to severe regurgitation. Calculated valve area was 1.14 cm. RVSP is 39 mmHg. She was diuresed and on discharge weight was around 160 pounds. She reports her weight at home is about 152 pounds and her weight today in the office was 159 pounds. She denies any history of lower extremity edema or abdominal swelling although occasionally feels some bloating. She has been on oxygen since discharge she says helps her the most but her breathing has not improved back to anywhere near her baseline.  02/13/2017  Traci [name] returns today for follow-up. She reports worsening shortness of breath. She's seeing Dr. Collene Mares him for COPD which is considered to be severe. She was found to have dramatic two-vessel valvular disease with moderate to severe AI and at least moderate rheumatic mitral  valve stenosis. EF was normal 60-65%. Her weight continues to decline. She reports decreased appetite. There is no evidence for worsening swelling and she remains on a diuretic. I reviewed her images and feel that is unlikely with her significant lung disease that she is a candidate for valve replacement surgery. Based on this the options are quite limited for her. I suspect that ultimately she will develop progressive worsening valvular heart disease leading heart failure and will either be admitted or this could be fatal. We did talk about some end-of-life issues today. Namely whether or not she wanted to be DO NOT RESUSCITATE. She did indicate that she's proves to discuss this with her family would not want to be resuscitated. Based on that we completed a yellow MOLST form today in the office which she was provided and I recommended she place a prominently in her residence.  08/19/2017  Traci [name] was seen today in follow-up.  She reports fairly stable COPD and heart failure symptoms.  As previously mentioned she has moderate to severe AI and moderate MS/MR and is not considered a valve surgery candidate based on her severe COPD.  Medical therapy is recommended.  We seem to be maintaining euvolemic status on her Lasix.  Her weight is actually down a few pounds.  Appetite is not great.  She did follow-up with her pulmonologist who made no changes to her medications.  02/04/2018  Traci [name] returns today for follow-up.  She says her COPD has been fairly stable although had an increase in her inhalers in January by her pulmonologist.  Recently she has had about 5 pound weight gain and does feel like she might be carrying more  fluid.  A repeat echo was just performed which demonstrates normal systolic function however she has severe aortic insufficiency and moderate mitral insufficiency with some mitral stenosis and heavy calcification of the valve leaflets.  Both valves appear to be rheumatic.  In the past  she has not been considered a candidate for surgery or traditional SAVR replacement due to her severe COPD.  It is questionable whether she could be a candidate for TAVR for her severe AI.  I do not think she will be a candidate for mitral valve surgery and given heavy calcification and stenosis, not a candidate for mitral clip.  In addition her regurgitation is not as severe.  I do feel that she is symptomatic related to severe AI, but the question is whether she would tolerate general anesthesia and mechanical ventilation given the severe COPD for the procedure.  In addition she is been having problems with weakness which appear to be due to a high cervical cord compression.  She saw Dr. Saintclair Halsted for this and he is considering surgery.  From my standpoint, she would be at high risk for surgery given her pulmonary and cardiac comorbidities.  If we could successfully replace her aortic valve, however she may be at acceptable cardiac risk for surgery at that point.  PMHx:  Past Medical History:  Diagnosis Date  . Anginal pain ([place])   . Anxiety   . Arthritis   . Chronic bronchitis ([place])   . Chronic lower back pain   . COPD (chronic obstructive pulmonary disease) ([place])   . Depression   . Dyspepsia   . GERD (gastroesophageal reflux disease)   . H/O hiatal hernia   . VFIEPPIR(518.8)    "1-2/week" (08/16/2016)  . History of blood transfusion 1963   w/childbirth  . Hyperlipidemia   . Hypertension   . Hypothyroidism   . Shortness of breath 02/12/12   "all the time"  . Tremor     Past Surgical History:  Procedure Laterality Date  . BACK SURGERY    . CARDIAC CATHETERIZATION  04/19/1995   EF 65-70%  . CATARACT EXTRACTION W/ INTRAOCULAR LENS  IMPLANT, BILATERAL  2012  . FRACTURE SURGERY    . LAPAROSCOPIC CHOLECYSTECTOMY  2010  . LEFT HEART CATHETERIZATION WITH CORONARY ANGIOGRAM N/A 02/13/2012   Procedure: LEFT HEART CATHETERIZATION WITH CORONARY ANGIOGRAM;  Surgeon: Thayer Headings, MD;  Location:  [facility] CATH LAB;  Service: Cardiovascular;  Laterality: N/A;  . LUMBAR DISC SURGERY  early 2000's  . TEE WITHOUT CARDIOVERSION N/A 08/22/2016   Procedure: TRANSESOPHAGEAL ECHOCARDIOGRAM (TEE);  Surgeon: Sanda Klein, MD;  Location: [facility];  Service: Cardiovascular;  Laterality: N/A;  . TIBIA IM NAIL INSERTION Right 03/30/2014   Procedure: INTRAMEDULLARY (IM) NAIL TIBIAL;  Surgeon: Wylene Simmer, MD;  Location: [facility];  Service: Orthopedics;  Laterality: Right;  . US ECHOCARDIOGRAPHY  12/17/2007   EF 55-60%  . VAGINAL HYSTERECTOMY  1980's    FAMHx:  Family History  Problem Relation Age of Onset  . Heart attack Father   . Obesity Sister   . Emphysema Sister   . Heart disease Brother     SOCHx:   reports that she quit smoking about 17 months ago. Her smoking use included cigarettes. She has a 5.50 pack-year smoking history. She quit smokeless tobacco use about 17 months ago. She reports that she does not drink alcohol or use drugs.  ALLERGIES:  Allergies  Allergen Reactions  . Cephalexin Hives  . Cymbalta [Duloxetine Hcl] Other (See  Comments)    Sees spiders  . Latex     " MAKES ME ITCHY "     ROS: Pertinent items noted in HPI and remainder of comprehensive ROS otherwise negative.  HOME MEDS: Current Outpatient Medications on File Prior to Visit  Medication Sig Dispense Refill  . albuterol (PROVENTIL HFA;VENTOLIN HFA) 108 (90 Base) MCG/ACT inhaler Inhale 2 puffs into the lungs every 6 (six) hours as needed for wheezing or shortness of breath. 1 Inhaler 2  . ARIPiprazole (ABILIFY) 2 MG tablet Take 1 tablet by mouth daily.    . budesonide-formoterol (SYMBICORT) 160-4.5 MCG/ACT inhaler Inhale 2 puffs into the lungs 2 (two) times daily. 1 Inhaler 2  . buPROPion (WELLBUTRIN XL) 150 MG 24 hr tablet Take 450 mg by mouth daily.     . Cholecalciferol (VITAMIN D3) 2000 units TABS Take 2,000 Units by mouth daily.    . Fluticasone-Umeclidin-Vilant (TRELEGY ELLIPTA) 100-62.5-25 MCG/INH AEPB  Inhale 1 puff into the lungs daily. 60 each 6  . folic acid (FOLVITE) 1 MG tablet Take 1 tablet (1 mg total) by mouth daily. 30 tablet 0  . furosemide (LASIX) 40 MG tablet Take 1 tablet (40 mg total) by mouth daily. 30 tablet 1  . hydrOXYzine (ATARAX/VISTARIL) 25 MG tablet Take 25 mg by mouth every 8 (eight) hours as needed.    Marland Kitchen levothyroxine (SYNTHROID, LEVOTHROID) 25 MCG tablet Take 25 mcg by mouth daily before breakfast.     . omeprazole (PRILOSEC) 20 MG capsule Take 20 mg by mouth daily.    Marland Kitchen oxyCODONE (ROXICODONE) 15 MG immediate release tablet Take 15 mg by mouth every 6 (six) hours as needed for pain.    . OXYGEN Inhale 2 L into the lungs as needed.    . potassium chloride SA (K-DUR,KLOR-CON) 20 MEQ tablet Take 1 tablet (20 mEq total) by mouth daily. 30 tablet 0  . pramipexole (MIRAPEX) 0.25 MG tablet     . traZODone (DESYREL) 150 MG tablet Take 300 mg by mouth at bedtime.     . vitamin B-12 (CYANOCOBALAMIN) 1000 MCG tablet Take 1,000 mcg by mouth daily.    . busPIRone (BUSPAR) 10 MG tablet Take 10 mg by mouth 2 (two) times daily.      No current facility-administered medications on file prior to visit.     LABS/IMAGING: No results found for this or any previous visit (from the past 48 hour(s)). No results found.  WEIGHTS: Wt Readings from Last 3 Encounters:  02/04/18 155 lb 9.6 oz (70.6 kg)  09/13/17 149 lb (67.6 kg)  08/19/17 149 lb (67.6 kg)    VITALS: BP 100/60   Pulse 76   Ht 5\' 9"  (1.753 m)   Wt 155 lb 9.6 oz (70.6 kg)   BMI 22.98 kg/m   EXAM: General appearance: alert and mild distress Neck: no carotid bruit and no JVD Lungs: diminished breath sounds bilaterally and Diffusely and On 2 L nasal cannula oxygen Heart: regular rate and rhythm Abdomen: soft, non-tender; bowel sounds normal; no masses,  no organomegaly Extremities: extremities normal, atraumatic, no cyanosis or edema Pulses: 2+ and symmetric Skin: Skin color, texture, turgor normal. No rashes or  lesions Neurologic: Alert and oriented X 3, normal strength and tone. Normal symmetric reflexes. Normal coordination and gait Psych: Pleasant  EKG: Normal sinus rhythm with sinus arrhythmia at 76, nonspecific ST changes-personally reviewed  ASSESSMENT: 1. 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 Moderate mitral stenosis with thickening and  cord involvement 3. Severe COPD on continuous oxygen 4. Acute on chronic diastolic congestive heart failure- NYHA class III symptoms 5. Cervical stenosis with weakness 6. DNR  PLAN: 1.   Traci [name] now has severe aortic insufficiency and moderate MR with normal LV function.  She understands if she continues with just medical therapy alone she will likely develop a dilated cardiomyopathy and systolic dysfunction which ultimately could be fatal for her.  She also has severe COPD.  Both of these would increase her risk of surgery and probably make it prohibitive at this point.  She may be a candidate for TAVR however this is not totally clear.  She does maintain that she wants to be DO NOT RESUSCITATE, but might wave this for a procedure.  She wants to discuss with her daughters further about what her options may be and I advised that she should consider an appointment in our multidisciplinary valve clinic to evaluate for TAVR.  Follow-up in 6 months.   Pixie Casino, MD, [facility], [place] Director of the Advanced Lipid Disorders &  Cardiovascular Risk Reduction Clinic Attending Cardiologist  Direct Dial: [phone]  Fax: [phone]  Website:  [email or web address] 02/04/2018, 8:54 AM

## 2018-02-06 ENCOUNTER — Other Ambulatory Visit: Payer: Self-pay | Admitting: Pulmonary Disease

## 2018-03-12 ENCOUNTER — Telehealth: Payer: Self-pay | Admitting: Internal Medicine

## 2018-03-12 DIAGNOSIS — I351 Nonrheumatic aortic (valve) insufficiency: Secondary | ICD-10-CM

## 2018-03-12 NOTE — Telephone Encounter (Signed)
Attempted to reach patient concerning possible TAVR evaluation. Phone rang continuously with no answer.   Per Dr. Burt Knack, patient has pure AI without any significant Ao valve calcification. TAVR is not an option for her with that anatomy. There are a few valves being studied for AI but nothing currently available. It is recommended by both Dr. Burt Knack and Dr. Debara Pickett that patient be evaluated by [facility]. Will place referral once able to reach patient, if she is agreeable.

## 2018-03-24 NOTE — Telephone Encounter (Signed)
Attempted to reach patient and man answered. Patient is not available. Asked if he could take a message and have her call back but he was not able to do so. Will re-attempt at a later date/time

## 2018-03-26 NOTE — Telephone Encounter (Signed)
Follow up      Patient is returning call that she received yesterday.

## 2018-03-27 NOTE — Telephone Encounter (Signed)
Spoke with patient and she is agreeable to meeting with [facility] doctor(s) to discuss options for aortic valve. Referral ordered in [facility]

## 2018-03-28 ENCOUNTER — Telehealth: Payer: Self-pay | Admitting: Pulmonary Disease

## 2018-03-28 NOTE — Telephone Encounter (Signed)
Called and spoke with patient advised that I have placed a sample up front for patient. Nothing further needed.

## 2018-03-31 ENCOUNTER — Telehealth: Payer: Self-pay | Admitting: Pulmonary Disease

## 2018-03-31 NOTE — Telephone Encounter (Signed)
Patient dropped off pt asst forms for Trelegy Ellipta Inhaler today. Placed forms in Dr. Matilde Bash forms for review  Routing message to [facility] to advise she rec'd the forms.

## 2018-04-02 NOTE — Telephone Encounter (Signed)
Spoke with Margie this morning, she has the paperwork from pt ready for Dr. Vaughan Browner to review

## 2018-04-03 NOTE — Telephone Encounter (Signed)
Forms have been placed in Dr. Vaughan Browner sign folder for signature.  Dr. Vaughan Browner has been made aware.

## 2018-04-04 NOTE — Telephone Encounter (Signed)
Patient assistance forms have been faxed to [place]. Pt is aware and voiced her understanding.  Will leave message open for f/u.

## 2018-04-15 ENCOUNTER — Encounter: Payer: Medicare Other | Admitting: Surgery

## 2018-04-15 MED ORDER — FLUTICASONE-UMECLIDIN-VILANT 100-62.5-25 MCG/INH IN AEPB: 1.0000 | INHALATION_SPRAY | Freq: Every day | RESPIRATORY_TRACT | 3 refills | Status: AC

## 2018-04-15 NOTE — Telephone Encounter (Signed)
Called and spoke to [facility] with [place] assistance.   Nahid stated Rx for Trelegy is needed to proceed with processing application.  Rx for Trelegy has been printed and placed in Dr. Matilde Bash cubby for signature.

## 2018-04-15 NOTE — Addendum Note (Signed)
Addended by: Maryanna Shape A on: 04/15/2018 02:17 PM   Modules accepted: Orders

## 2018-04-16 ENCOUNTER — Encounter: Payer: Medicare Other | Admitting: Surgery

## 2018-04-24 ENCOUNTER — Encounter: Payer: Medicare Other | Admitting: Surgery

## 2018-04-25 ENCOUNTER — Other Ambulatory Visit: Payer: Self-pay

## 2018-04-25 ENCOUNTER — Institutional Professional Consult (permissible substitution) (INDEPENDENT_AMBULATORY_CARE_PROVIDER_SITE_OTHER): Payer: Medicare Other | Admitting: Surgery

## 2018-04-25 ENCOUNTER — Encounter: Payer: Self-pay | Admitting: Surgery

## 2018-04-25 VITALS — BP 102/58 | HR 94 | Resp 16 | Ht 69.0 in | Wt 159.0 lb

## 2018-04-25 DIAGNOSIS — I351 Nonrheumatic aortic (valve) insufficiency: Secondary | ICD-10-CM | POA: Diagnosis not present

## 2018-04-25 NOTE — Progress Notes (Signed)
Cardiothoracic Surgery Consultation   PCP is Raelyn Number, MD Referring Provider is Debara Pickett Nadean Corwin, MD  Chief Complaint  Patient presents with  . Aortic Insuffiency    severe...ECHO 01/24/18    HPI:  The patient is a 72 year old woman with a history of hypertension, hyperlipidemia, hypothyroidism, previous heavy smoking with severe COPD on home oxygen, anxiety and depression, chronic tremor and extremity weakness with a diagnosis of high cervical cord compression as well as aortic and mitral valve disease.  She had been followed in the past by Dr. Mare Ferrari and had a left heart catheterization in June 2013 that showed nonobstructive coronary disease with normal LV function.  She had a TEE in 08/2016 which showed a trileaflet aortic valve with mildly thickened leaflets and moderate to severe regurgitation with no stenosis.  She recently had a follow-up 2D echo on 01/24/2018 which showed severe aortic insufficiency with no stenosis.  The mitral valve had mildly thickened leaflets with mild to moderate stenosis and moderate regurgitation.  She has been followed by Dr. Vaughan Browner of pulmonary medicine for her COPD.  She has a 50-pack-year smoking history but said that she no longer smokes.  She does report chest tightness and shortness of breath with exertion as well as orthopnea.  She has had decreased energy level.  She is not very active due to a high cervical cord compression felt to be causing extremity weakness and tremor.  She has had multiple falls.  Past Medical History:  Diagnosis Date  . Anginal pain ([place])   . Anxiety   . Arthritis   . Chronic bronchitis ([place])   . Chronic lower back pain   . COPD (chronic obstructive pulmonary disease) ([place])   . Depression   . Dyspepsia   . GERD (gastroesophageal reflux disease)   . H/O hiatal hernia   . UXNATFTD(322.0)    "1-2/week" (08/16/2016)  . History of blood transfusion 1963   w/childbirth  . Hyperlipidemia   . Hypertension   .  Hypothyroidism   . Shortness of breath 02/12/12   "all the time"  . Tremor     Past Surgical History:  Procedure Laterality Date  . BACK SURGERY    . CARDIAC CATHETERIZATION  04/19/1995   EF 65-70%  . CATARACT EXTRACTION W/ INTRAOCULAR LENS  IMPLANT, BILATERAL  2012  . FRACTURE SURGERY    . LAPAROSCOPIC CHOLECYSTECTOMY  2010  . LEFT HEART CATHETERIZATION WITH CORONARY ANGIOGRAM N/A 02/13/2012   Procedure: LEFT HEART CATHETERIZATION WITH CORONARY ANGIOGRAM;  Surgeon: Thayer Headings, MD;  Location: [facility] CATH LAB;  Service: Cardiovascular;  Laterality: N/A;  . LUMBAR DISC SURGERY  early 2000's  . TEE WITHOUT CARDIOVERSION N/A 08/22/2016   Procedure: TRANSESOPHAGEAL ECHOCARDIOGRAM (TEE);  Surgeon: Sanda Klein, MD;  Location: [place];  Service: Cardiovascular;  Laterality: N/A;  . TIBIA IM NAIL INSERTION Right 03/30/2014   Procedure: INTRAMEDULLARY (IM) NAIL TIBIAL;  Surgeon: Wylene Simmer, MD;  Location: [place];  Service: Orthopedics;  Laterality: Right;  . US ECHOCARDIOGRAPHY  12/17/2007   EF 55-60%  . VAGINAL HYSTERECTOMY  1980's    Family History  Problem Relation Age of Onset  . Heart attack Father   . Obesity Sister   . Emphysema Sister   . Heart disease Brother     Social History Social History   Tobacco Use  . Smoking status: Former Smoker    Packs/day: 0.10    Years: 55.00    Pack years: 5.50  Types: Cigarettes    Last attempt to quit: 08/19/2016    Years since quitting: 1.6  . Smokeless tobacco: Former Systems developer    Quit date: 08/13/2016  . Tobacco comment: Encouraged to remain smoke free  Substance Use Topics  . Alcohol use: No    Alcohol/week: 0.0 standard drinks  . Drug use: No    Current Outpatient Medications  Medication Sig Dispense Refill  . albuterol (PROVENTIL HFA;VENTOLIN HFA) 108 (90 Base) MCG/ACT inhaler Inhale 2 puffs into the lungs every 6 (six) hours as needed for wheezing or shortness of breath. 1 Inhaler 2  . ARIPiprazole (ABILIFY) 2 MG tablet  Take 1 tablet by mouth daily.    Marland Kitchen buPROPion (WELLBUTRIN XL) 150 MG 24 hr tablet Take 450 mg by mouth daily.     . busPIRone (BUSPAR) 10 MG tablet Take 10 mg by mouth 2 (two) times daily.     . Cholecalciferol (VITAMIN D3) 2000 units TABS Take 2,000 Units by mouth daily.    . Fluticasone-Umeclidin-Vilant (TRELEGY ELLIPTA) 100-62.5-25 MCG/INH AEPB Inhale 1 puff into the lungs daily. 60 each 6  . folic acid (FOLVITE) 1 MG tablet Take 1 tablet (1 mg total) by mouth daily. 30 tablet 0  . furosemide (LASIX) 40 MG tablet Take 1 tablet (40 mg total) by mouth daily. 30 tablet 1  . hydrOXYzine (ATARAX/VISTARIL) 25 MG tablet Take 25 mg by mouth every 8 (eight) hours as needed.    Marland Kitchen levothyroxine (SYNTHROID, LEVOTHROID) 25 MCG tablet Take 25 mcg by mouth daily before breakfast.     . omeprazole (PRILOSEC) 20 MG capsule Take 20 mg by mouth daily.    Marland Kitchen oxyCODONE (ROXICODONE) 15 MG immediate release tablet Take 15 mg by mouth every 6 (six) hours as needed for pain.    . OXYGEN Inhale 2 L into the lungs as needed.    . potassium chloride SA (K-DUR,KLOR-CON) 20 MEQ tablet Take 1 tablet (20 mEq total) by mouth daily. 30 tablet 0  . pramipexole (MIRAPEX) 0.25 MG tablet     . SYMBICORT 160-4.5 MCG/ACT inhaler TAKE 2 PUFFS BY MOUTH TWICE A DAY 10.2 Inhaler 2  . traZODone (DESYREL) 150 MG tablet Take 300 mg by mouth at bedtime.     . vitamin B-12 (CYANOCOBALAMIN) 1000 MCG tablet Take 1,000 mcg by mouth daily.     No current facility-administered medications for this visit.     Allergies  Allergen Reactions  . Cephalexin Hives  . Cymbalta [Duloxetine Hcl] Other (See Comments)    Sees spiders  . Latex     " MAKES ME ITCHY "     Review of Systems  Constitutional: Positive for appetite change and fatigue.       Weight gain  HENT: Positive for hearing loss.        Has dentures  Eyes: Positive for visual disturbance.       Blurry vision and floaters  Respiratory: Positive for cough, chest tightness,  shortness of breath and wheezing.   Cardiovascular: Positive for chest pain. Negative for palpitations and leg swelling.  Gastrointestinal: Positive for constipation.       Reflux and frequent heartburn  Endocrine: Negative.   Genitourinary: Negative.   Musculoskeletal: Positive for gait problem and myalgias.  Skin: Negative.   Allergic/Immunologic: Negative.   Neurological: Positive for tremors.       Memory problems.  Hematological: Bruises/bleeds easily.  Psychiatric/Behavioral: The patient is nervous/anxious.        Depression  BP (!) 102/58 (BP Location: Left Arm, Patient Position: Sitting, Cuff Size: Large)   Pulse 94   Resp 16   Ht 5\' 9"  (1.753 m)   Wt 159 lb (72.1 kg)   SpO2 94% Comment: ON RA  BMI 23.48 kg/m  Physical Exam  Constitutional: She is oriented to person, place, and time.  Elderly, frail-appearing woman in no distress.  HENT:  Head: Normocephalic and atraumatic.  Mouth/Throat: Oropharynx is clear and moist.  Eyes: Pupils are equal, round, and reactive to light. EOM are normal.  Neck: Normal range of motion. Neck supple. No JVD present.  Cardiovascular: Normal rate, regular rhythm and intact distal pulses.  Murmur heard. 2/6 diastolic murmur along the left lower sternal border  Pulmonary/Chest: Effort normal and breath sounds normal. No respiratory distress. She has no wheezes. She has no rales.  Abdominal: Soft. Bowel sounds are normal. She exhibits no distension. There is no tenderness.  Musculoskeletal: Normal range of motion. She exhibits no edema.  Lymphadenopathy:    She has no cervical adenopathy.  Neurological: She is alert and oriented to person, place, and time.  Resting tremor  Skin: Skin is warm and dry.  Psychiatric: She has a normal mood and affect.     Diagnostic Tests:  Result status: Final result                           [facility]                        [address], [place]  [id number]                            [phone]  ------------------------------------------------------------------- Transthoracic Echocardiography  Patient:    Sherol, Sabas MR #:       [id number] Study Date: 01/24/2018 Gender:     F Age:        30 Height:     175.3 cm Weight:     67.6 kg BSA:        1.81 m^2 Pt. 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  ------------------------------------------------------------------- History:   PMH:   Dyspnea.  Congestive heart failure.  Chronic obstructive pulmonary disease.  Risk factors:  Rheumatic mitral and aortic valves. Tachycardia. Former tobacco use. Hypertension. Dyslipidemia.  ------------------------------------------------------------------- Study Conclusions  - Left ventricle: The cavity size was normal. Wall thickness was   normal. Systolic function was normal. The estimated ejection   fraction was in the range of 60% to 65%. Wall motion was normal;   there were no regional wall motion abnormalities. The study is   not technically sufficient to allow evaluation of LV diastolic   function. LV filling pressure is elevated. - Aortic valve: Poorly visualized. Sclerosis without stenosis.   There was severe regurgitation. - Mitral valve: Mildly thickened leaflets with poor anterior   leaflet excursion. Mild to moderate stenosis. There was moderate  regurgitation. Mean gradient (D): 9 mm Hg. Peak gradient (D): 15   mm Hg. - Inferior vena cava: The vessel was normal in size. The   respirophasic diameter changes were in the normal range (= 50%),   consistent with normal central venous  pressure.  Impressions:  - Compared to a prior study in 2018, the LVEF is unchanged. There   is severe AI and moderate MR with mild to moderate AS.  ------------------------------------------------------------------- Labs, prior tests, procedures, and surgery: Transthoracic echocardiography (08/17/2016).    The mitral valve showed moderate stenosis and no significant regurgitation. The aortic valve showed moderate regurgitation.  EF was 55% and PA pressure was 45 (systolic).  Transesophageal echocardiography (09/19/2016).    The mitral valve showed moderate to severe regurgitation. The aortic valve showed moderate to severe regurgitation.  EF was 60% and PA pressure was 39 (systolic).  ------------------------------------------------------------------- Study data:  Comparison was made to the study of 09/19/2016.  Study status:  Routine.  Procedure:  The patient reported no pain pre or post test. Transthoracic echocardiography. Image quality was adequate.  Study completion:  There were no complications. Transthoracic echocardiography.  M-mode, complete 2D, 3D, spectral Doppler, and color Doppler.  Birthdate:  Patient birthdate: 1946/03/25.  Age:  Patient is 72 yr old.  Sex:  Gender: female. BMI: 22 kg/m^2.  Blood pressure:     108/64  Patient status: Outpatient.  Study date:  Study date: 01/24/2018. Study time: 02:35 PM.  Location:  [facility]  -------------------------------------------------------------------  ------------------------------------------------------------------- Left ventricle:  The cavity size was normal. Wall thickness was normal. Systolic function was normal. The estimated ejection fraction was in the range of 60% to 65%. Wall motion was normal; there were no regional wall motion abnormalities. The study is not technically sufficient to allow evaluation of LV diastolic function. LV filling pressure is  elevated.  ------------------------------------------------------------------- Aortic valve:  Poorly visualized. Sclerosis without stenosis. Doppler:  There was severe regurgitation.  ------------------------------------------------------------------- Aorta:  Aortic root: The aortic root was normal in size. Ascending aorta: The ascending aorta was normal in size.  ------------------------------------------------------------------- Mitral valve:  Mildly thickened leaflets with poor anterior leaflet excursion. Mild to moderate stenosis.  Doppler:  There was moderate regurgitation.    Valve area by pressure half-time: 1.71 cm^2. Indexed valve area by pressure half-time: 0.94 cm^2/m^2. Valve area by continuity equation (using LVOT flow): 1.31 cm^2. Indexed valve area by continuity equation (using LVOT flow): 0.72 cm^2/m^2. Mean gradient (D): 9 mm Hg. Peak gradient (D): 15 mm Hg.  ------------------------------------------------------------------- Left atrium:  The atrium was normal in size.  ------------------------------------------------------------------- Right ventricle:  The cavity size was normal. Wall thickness was normal. Systolic function was normal.  ------------------------------------------------------------------- Pulmonic valve:   Poorly visualized.  Doppler:  There was no significant regurgitation.  ------------------------------------------------------------------- Tricuspid valve:   Doppler:  There was no significant regurgitation.  ------------------------------------------------------------------- Pulmonary artery:   Poorly visualized.  ------------------------------------------------------------------- Right atrium:  The atrium was normal in size.  ------------------------------------------------------------------- Pericardium:  There was no pericardial effusion.  ------------------------------------------------------------------- Systemic  veins: Inferior vena cava: The vessel was normal in size. The respirophasic diameter changes were in the normal range (= 50%), consistent with normal central venous pressure.  ------------------------------------------------------------------- Measurements   Left ventricle                            Value          Reference  LV ID, ED, PLAX chordal           (  H)     57    mm       43 - 52  LV ID, ES, PLAX chordal           (H)     46    mm       23 - 38  LV fx shortening, PLAX chordal    (L)     19    %        >=29  LV PW thickness, ED                       9     mm       ---------  IVS/LV PW ratio, ED                       1              <=1.3  Stroke volume, 2D                         88    ml       ---------  Stroke volume/bsa, 2D                     49    ml/m^2   ---------  LV e&', lateral                            7.7   cm/s     ---------  LV E/e&', lateral                          24.81          ---------  LV e&', medial                             5.59  cm/s     ---------  LV E/e&', medial                           34.17          ---------  LV e&', average                            6.65  cm/s     ---------  LV E/e&', average                          28.74          ---------    Ventricular septum                        Value          Reference  IVS thickness, ED                         9     mm       ---------    LVOT                                      Value  Reference  LVOT ID, S                                19    mm       ---------  LVOT area                                 2.84  cm^2     ---------  LVOT peak velocity, S                     165   cm/s     ---------  LVOT mean velocity, S                     107   cm/s     ---------  LVOT VTI, S                               31.1  cm       ---------  LVOT peak gradient, S                     11    mm Hg    ---------    Aortic valve                              Value          Reference  Aortic regurg pressure  half-time          264   ms       ---------    Aorta                                     Value          Reference  Aortic root ID, ED                        32    mm       ---------  Ascending aorta ID, A-P, S                32    mm       ---------    Left atrium                               Value          Reference  LA ID, A-P, ES                            41    mm       ---------  LA ID/bsa, A-P                    (H)     2.26  cm/m^2   <=2.2  LA volume, S                              50    ml       ---------  LA volume/bsa, S                          27.6  ml/m^2   ---------  LA volume, ES, 1-p A4C                    39.7  ml       ---------  LA volume/bsa, ES, 1-p A4C                21.9  ml/m^2   ---------  LA volume, ES, 1-p A2C                    58.2  ml       ---------  LA volume/bsa, ES, 1-p A2C                32.1  ml/m^2   ---------    Mitral valve                              Value          Reference  Mitral E-wave peak velocity               191   cm/s     ---------  Mitral A-wave peak velocity               143   cm/s     ---------  Mitral mean velocity, D                   139   cm/s     ---------  Mitral deceleration time          (H)     377   ms       150 - 230  Mitral pressure half-time                 148   ms       ---------  Mitral mean gradient, D                   9     mm Hg    ---------  Mitral peak gradient, D                   15    mm Hg    ---------  Mitral E/A ratio, peak                    1.3            ---------  Mitral valve area, PHT, DP                1.71  cm^2     ---------  Mitral valve area/bsa, PHT, DP            0.94  cm^2/m^2 ---------  Mitral valve area, LVOT                   1.31  cm^2     ---------  continuity  Mitral valve area/bsa, LVOT               0.72  cm^2/m^2 ---------  continuity  Mitral annulus VTI, D                     67.4  cm       ---------  Mitral regurg VTI, PISA                   173   cm       ---------  Mitral  ERO, PISA                          0.2   cm^2     ---------  Mitral regurg volume, PISA                35    ml       ---------    Systemic veins                            Value          Reference  Estimated CVP                             3     mm Hg    ---------    Right ventricle                           Value          Reference  TAPSE                                     24.8  mm       ---------  RV s&', lateral, S                         13.5  cm/s     ---------  Legend: (L)  and  (H)  mark values outside specified reference range.  ------------------------------------------------------------------- Prepared and Electronically Authenticated by  Lyman Bishop MD 2019-06-07T16:56:16    Impression:  This 72 year old woman has severe aortic insufficiency and mild to moderate mitral stenosis with moderate regurgitation with a rheumatic appearing mitral valve.  She has normal-appearing left ventricular systolic function.  Her left ventricular internal dimensions are enlarged with a diastolic diameter of 57 mm and a systolic diameter of 46 mm.  She does have shortness of breath with exertion which appears to be improved with diuretics.  She has no aortic stenosis and I reviewed a CT scan of the chest was done on 10/03/2017 which showed no calcium in the aortic valve.  Her aortic valve leaflets do not appear calcified on her current echocardiogram.  Therefore transcatheter aortic valve replacement could not be performed safely for pure aortic insufficiency with a noncalcified valve.  She is 72 years old with severe COPD and severe reduction in her diffusion capacity by pulmonary function testing in January 2018.  This is certainly contributing to her shortness of breath.  With her degree of lung disease, neurologic dysfunction from cervical cord compression, and deconditioning/debilitation/poor mobility I do not think she would be a candidate for open surgical aortic valve replacement and  mitral valve replacement. I reviewed the echo images with her and her daughter and answered all of their questions. I think the only option for her is continued medical treatment.   Plan:  She will continue to follow up with cardiology for continued medical management.  I spent 60 minutes performing this consultation and > 50% of this time  was spent face to face counseling and coordinating the care of this patient's severe aortic insufficiency, moderate mitral stenosis and moderate mitral regurgitation.    Gaye Pollack, MD [facility] [phone]

## 2018-04-29 ENCOUNTER — Telehealth: Payer: Self-pay | Admitting: Pulmonary Disease

## 2018-04-29 NOTE — Telephone Encounter (Signed)
Called and spoke with patient, advised her that the forms have been sent to [facility] for patient assistance and she should hear from them within the next week or so. Nothing further needed.

## 2018-04-29 NOTE — Telephone Encounter (Signed)
Rx for Trelegy has been signed and faxed back to [place] patient assistance.  Nothing further is needed.

## 2018-07-23 ENCOUNTER — Observation Stay (HOSPITAL_COMMUNITY)
Admission: EM | Admit: 2018-07-23 | Discharge: 2018-07-25 | Disposition: A | Discharge status: Home / Self Care | Payer: Medicare Other | Attending: Internal Medicine | Admitting: Internal Medicine

## 2018-07-23 ENCOUNTER — Observation Stay (HOSPITAL_COMMUNITY): Payer: Medicare Other

## 2018-07-23 ENCOUNTER — Encounter (HOSPITAL_COMMUNITY): Payer: Self-pay | Admitting: Emergency Medicine

## 2018-07-23 ENCOUNTER — Emergency Department (HOSPITAL_COMMUNITY): Payer: Medicare Other

## 2018-07-23 ENCOUNTER — Other Ambulatory Visit: Payer: Self-pay

## 2018-07-23 DIAGNOSIS — K219 Gastro-esophageal reflux disease without esophagitis: Secondary | ICD-10-CM | POA: Insufficient documentation

## 2018-07-23 DIAGNOSIS — S20212A Contusion of left front wall of thorax, initial encounter: Secondary | ICD-10-CM | POA: Diagnosis not present

## 2018-07-23 DIAGNOSIS — W19XXXA Unspecified fall, initial encounter: Secondary | ICD-10-CM

## 2018-07-23 DIAGNOSIS — I10 Essential (primary) hypertension: Secondary | ICD-10-CM | POA: Diagnosis not present

## 2018-07-23 DIAGNOSIS — F419 Anxiety disorder, unspecified: Secondary | ICD-10-CM

## 2018-07-23 DIAGNOSIS — Z91040 Latex allergy status: Secondary | ICD-10-CM

## 2018-07-23 DIAGNOSIS — Z79899 Other long term (current) drug therapy: Secondary | ICD-10-CM | POA: Insufficient documentation

## 2018-07-23 DIAGNOSIS — R296 Repeated falls: Secondary | ICD-10-CM | POA: Diagnosis not present

## 2018-07-23 DIAGNOSIS — I351 Nonrheumatic aortic (valve) insufficiency: Secondary | ICD-10-CM

## 2018-07-23 DIAGNOSIS — Z9981 Dependence on supplemental oxygen: Secondary | ICD-10-CM

## 2018-07-23 DIAGNOSIS — Z881 Allergy status to other antibiotic agents status: Secondary | ICD-10-CM

## 2018-07-23 DIAGNOSIS — I2510 Atherosclerotic heart disease of native coronary artery without angina pectoris: Secondary | ICD-10-CM

## 2018-07-23 DIAGNOSIS — Z87891 Personal history of nicotine dependence: Secondary | ICD-10-CM | POA: Insufficient documentation

## 2018-07-23 DIAGNOSIS — Z888 Allergy status to other drugs, medicaments and biological substances status: Secondary | ICD-10-CM | POA: Diagnosis not present

## 2018-07-23 DIAGNOSIS — J449 Chronic obstructive pulmonary disease, unspecified: Secondary | ICD-10-CM | POA: Diagnosis present

## 2018-07-23 DIAGNOSIS — E785 Hyperlipidemia, unspecified: Secondary | ICD-10-CM | POA: Insufficient documentation

## 2018-07-23 DIAGNOSIS — N179 Acute kidney failure, unspecified: Secondary | ICD-10-CM | POA: Diagnosis not present

## 2018-07-23 DIAGNOSIS — E039 Hypothyroidism, unspecified: Secondary | ICD-10-CM | POA: Diagnosis not present

## 2018-07-23 DIAGNOSIS — R0789 Other chest pain: Secondary | ICD-10-CM | POA: Diagnosis not present

## 2018-07-23 DIAGNOSIS — R109 Unspecified abdominal pain: Secondary | ICD-10-CM | POA: Diagnosis present

## 2018-07-23 DIAGNOSIS — F329 Major depressive disorder, single episode, unspecified: Secondary | ICD-10-CM | POA: Insufficient documentation

## 2018-07-23 DIAGNOSIS — W1811XA Fall from or off toilet without subsequent striking against object, initial encounter: Secondary | ICD-10-CM | POA: Insufficient documentation

## 2018-07-23 DIAGNOSIS — Z9181 History of falling: Secondary | ICD-10-CM

## 2018-07-23 LAB — BASIC METABOLIC PANEL
Anion gap: 11 (ref 5–15)
BUN: 10 mg/dL (ref 8–23)
CO2: 25 mmol/L (ref 22–32)
CREATININE: 1.28 mg/dL — AB (ref 0.44–1.00)
Calcium: 9 mg/dL (ref 8.9–10.3)
Chloride: 104 mmol/L (ref 98–111)
GFR calc Af Amer: 48 mL/min — ABNORMAL LOW (ref >60)
GFR calc non Af Amer: 41 mL/min — ABNORMAL LOW (ref >60)
Glucose, Bld: 115 mg/dL — ABNORMAL HIGH (ref 70–99)
Potassium: 4.1 mmol/L (ref 3.5–5.1)
SODIUM: 140 mmol/L (ref 135–145)

## 2018-07-23 LAB — CBC WITH DIFFERENTIAL/PLATELET
Abs Immature Granulocytes: 0.03 10*3/uL (ref 0.00–0.07)
BASOS PCT: 0 %
Basophils Absolute: 0 10*3/uL (ref 0.0–0.1)
Eosinophils Absolute: 0.2 10*3/uL (ref 0.0–0.5)
Eosinophils Relative: 2 %
HEMATOCRIT: 38.6 % (ref 36.0–46.0)
Hemoglobin: 11.3 g/dL — ABNORMAL LOW (ref 12.0–15.0)
Immature Granulocytes: 0 %
LYMPHS ABS: 1.1 10*3/uL (ref 0.7–4.0)
Lymphocytes Relative: 13 %
MCH: 24.9 pg — AB (ref 26.0–34.0)
MCHC: 29.3 g/dL — ABNORMAL LOW (ref 30.0–36.0)
MCV: 85.2 fL (ref 80.0–100.0)
MONO ABS: 0.8 10*3/uL (ref 0.1–1.0)
MONOS PCT: 9 %
NEUTROS ABS: 6.1 10*3/uL (ref 1.7–7.7)
Neutrophils Relative %: 76 %
PLATELETS: 238 10*3/uL (ref 150–400)
RBC: 4.53 MIL/uL (ref 3.87–5.11)
RDW: 14.5 % (ref 11.5–15.5)
WBC: 8.2 10*3/uL (ref 4.0–10.5)
nRBC: 0 % (ref 0.0–0.2)

## 2018-07-23 LAB — TROPONIN I
Troponin I: <0.03 ng/mL (ref <0.03)
Troponin I: <0.03 ng/mL (ref <0.03)

## 2018-07-23 MED ORDER — IPRATROPIUM-ALBUTEROL 0.5-2.5 (3) MG/3ML IN SOLN
3.0000 mL | Freq: Three times a day (TID) | RESPIRATORY_TRACT | Status: DC
  Administered 2018-07-24 – 2018-07-25 (×4): 3 mL via RESPIRATORY_TRACT
  Filled 2018-07-23 (×4): qty 3

## 2018-07-23 MED ORDER — METHYLPREDNISOLONE SODIUM SUCC 125 MG IJ SOLR
60.0000 mg | Freq: Once | INTRAMUSCULAR | Status: DC
  Filled 2018-07-23: qty 2

## 2018-07-23 MED ORDER — FLUTICASONE-UMECLIDIN-VILANT 100-62.5-25 MCG/INH IN AEPB: 1.0000 | INHALATION_SPRAY | Freq: Every day | RESPIRATORY_TRACT | Status: DC

## 2018-07-23 MED ORDER — FLUTICASONE FUROATE-VILANTEROL 100-25 MCG/INH IN AEPB
1.0000 | INHALATION_SPRAY | Freq: Every day | RESPIRATORY_TRACT | Status: DC
  Administered 2018-07-23 – 2018-07-25 (×3): 1 via RESPIRATORY_TRACT
  Filled 2018-07-23: qty 28

## 2018-07-23 MED ORDER — IPRATROPIUM-ALBUTEROL 0.5-2.5 (3) MG/3ML IN SOLN
3.0000 mL | Freq: Four times a day (QID) | RESPIRATORY_TRACT | Status: DC
  Administered 2018-07-23 (×2): 3 mL via RESPIRATORY_TRACT
  Filled 2018-07-23 (×2): qty 3

## 2018-07-23 MED ORDER — SODIUM CHLORIDE 0.9 % IV SOLN
INTRAVENOUS | Status: AC
  Administered 2018-07-23: 12:00:00 via INTRAVENOUS

## 2018-07-23 MED ORDER — ENOXAPARIN SODIUM 40 MG/0.4ML ~~LOC~~ SOLN
40.0000 mg | SUBCUTANEOUS | Status: DC
  Administered 2018-07-23 – 2018-07-24 (×2): 40 mg via SUBCUTANEOUS
  Filled 2018-07-23 (×2): qty 0.4

## 2018-07-23 MED ORDER — FENTANYL CITRATE (PF) 100 MCG/2ML IJ SOLN: 12.5000 ug | INTRAMUSCULAR | Status: DC | PRN

## 2018-07-23 MED ORDER — OXYCODONE-ACETAMINOPHEN 5-325 MG PO TABS
2.0000 | ORAL_TABLET | Freq: Once | ORAL | Status: AC
  Administered 2018-07-23: 2 via ORAL
  Filled 2018-07-23: qty 2

## 2018-07-23 MED ORDER — PANTOPRAZOLE SODIUM 40 MG PO TBEC
40.0000 mg | DELAYED_RELEASE_TABLET | Freq: Every day | ORAL | Status: DC
  Administered 2018-07-23 – 2018-07-25 (×3): 40 mg via ORAL
  Filled 2018-07-23 (×3): qty 1

## 2018-07-23 MED ORDER — BUPROPION HCL ER (XL) 150 MG PO TB24
450.0000 mg | ORAL_TABLET | Freq: Every day | ORAL | Status: DC
  Administered 2018-07-23 – 2018-07-25 (×3): 450 mg via ORAL
  Filled 2018-07-23 (×3): qty 3

## 2018-07-23 MED ORDER — IPRATROPIUM-ALBUTEROL 0.5-2.5 (3) MG/3ML IN SOLN
3.0000 mL | Freq: Once | RESPIRATORY_TRACT | Status: AC
  Administered 2018-07-23: 3 mL via RESPIRATORY_TRACT
  Filled 2018-07-23: qty 3

## 2018-07-23 MED ORDER — OXYCODONE-ACETAMINOPHEN 7.5-325 MG PO TABS
2.0000 | ORAL_TABLET | Freq: Four times a day (QID) | ORAL | Status: DC
  Administered 2018-07-23: 2 via ORAL
  Filled 2018-07-23 (×2): qty 2

## 2018-07-23 MED ORDER — LEVOTHYROXINE SODIUM 25 MCG PO TABS
25.0000 ug | ORAL_TABLET | Freq: Every day | ORAL | Status: DC
  Administered 2018-07-24 – 2018-07-25 (×2): 25 ug via ORAL
  Filled 2018-07-23 (×2): qty 1

## 2018-07-23 MED ORDER — OXYCODONE-ACETAMINOPHEN 7.5-325 MG PO TABS
2.0000 | ORAL_TABLET | Freq: Four times a day (QID) | ORAL | Status: DC | PRN
  Administered 2018-07-23 – 2018-07-25 (×7): 2 via ORAL
  Filled 2018-07-23 (×7): qty 2

## 2018-07-23 MED ORDER — ALBUTEROL SULFATE (2.5 MG/3ML) 0.083% IN NEBU
2.5000 mg | INHALATION_SOLUTION | Freq: Once | RESPIRATORY_TRACT | Status: DC
  Filled 2018-07-23: qty 3

## 2018-07-23 MED ORDER — ARIPIPRAZOLE 2 MG PO TABS
2.0000 mg | ORAL_TABLET | Freq: Every day | ORAL | Status: DC
  Administered 2018-07-23 – 2018-07-25 (×3): 2 mg via ORAL
  Filled 2018-07-23 (×3): qty 1

## 2018-07-23 MED ORDER — UMECLIDINIUM BROMIDE 62.5 MCG/INH IN AEPB
1.0000 | INHALATION_SPRAY | Freq: Every day | RESPIRATORY_TRACT | Status: DC
  Administered 2018-07-23 – 2018-07-25 (×3): 1 via RESPIRATORY_TRACT
  Filled 2018-07-23: qty 7

## 2018-07-23 NOTE — ED Provider Notes (Addendum)
[facility] EMERGENCY DEPARTMENT Provider Note   CSN: [id number] Arrival date & time: 07/23/18  8144     History   Chief Complaint Chief Complaint  Patient presents with  . Rib Injury    HPI Traci [name] is a 72 y.o. female.  Patient presents to the emergency department for evaluation of left rib injury.  Patient fell off the toilet 2 days ago.  She hit the left side of her ribs on the bathtub.  Patient has been having left-sided pain in the ribs since the fall.  Pain has been progressively worsening.  Patient comes to the ER by ambulance.  She was given fentanyl 200 mcg during transport with improvement of her pain.  She has mild shortness of breath.     Past Medical History:  Diagnosis Date  . Anginal pain ([place])   . Anxiety   . Arthritis   . Chronic bronchitis ([place])   . Chronic lower back pain   . COPD (chronic obstructive pulmonary disease) ([place])   . Depression   . Dyspepsia   . GERD (gastroesophageal reflux disease)   . H/O hiatal hernia   . YJEHUDJS(970.2)    "1-2/week" (08/16/2016)  . History of blood transfusion 1963   w/childbirth  . Hyperlipidemia   . Hypertension   . Hypothyroidism   . Shortness of breath 02/12/12   "all the time"  . Tremor     Patient Active Problem List   Diagnosis Date Noted  . Severe aortic insufficiency 02/04/2018  . Tremor   . Hypertension   . Hyperlipidemia   . H/O hiatal hernia   . GERD (gastroesophageal reflux disease)   . Dyspepsia   . Depression   . COPD (chronic obstructive pulmonary disease) ([place])   . Chronic lower back pain   . Chronic bronchitis ([place])   . Arthritis   . Anxiety   . Anginal pain ([place])   . DNR (do not resuscitate) 02/13/2017  . Shortness of breath 09/14/2016  . 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 Acute on chronic diastolic CHF (congestive heart failure), NYHA class 3 ([place])   . CHF (congestive heart failure) ([place]) 08/16/2016  . Anemia 08/16/2016  .  Chronic back pain 08/16/2016  . Hypothyroidism 08/16/2016  . Benign essential tremor 08/16/2016  . Acute respiratory failure with hypoxia ([place]) 04/01/2014  . Sinus tachycardia 03/31/2014  . Fractured tibia and fibula 03/26/2014  . Right tibial fracture 03/26/2014  . Closed right tibial fracture 03/26/2014  . Unstable gait 03/26/2014  . Chest pain syndrome 02/12/2012  . Spinal stenosis 09/24/2011  . HEARTBURN 08/03/2010  . BENIGN NEOPLASM OF ADRENAL GLAND 07/28/2010  . Anxiety disorder 07/28/2010  . MYOCARDIAL INFARCTION, HX OF 07/28/2010  . Chronic obstructive pulmonary disease ([place]) 07/28/2010  . DIVERTICULOSIS, COLON 07/28/2010  . COLONIC POLYPS, ADENOMATOUS, HX OF 07/28/2010  . HLD (hyperlipidemia) 06/23/2010  . HTN (hypertension) 06/23/2010  . ACUTE DILATATION OF STOMACH 06/23/2010  . ABDOMINAL PAIN, GENERALIZED 06/23/2010  . History of blood transfusion 08/20/1961    Past Surgical History:  Procedure Laterality Date  . BACK SURGERY    . CARDIAC CATHETERIZATION  04/19/1995   EF 65-70%  . CATARACT EXTRACTION W/ INTRAOCULAR LENS  IMPLANT, BILATERAL  2012  . FRACTURE SURGERY    . LAPAROSCOPIC CHOLECYSTECTOMY  2010  . LEFT HEART CATHETERIZATION WITH CORONARY ANGIOGRAM N/A 02/13/2012   Procedure: LEFT HEART CATHETERIZATION WITH CORONARY ANGIOGRAM;  Surgeon: Thayer Headings, MD;  Location: [facility] CATH LAB;  Service: Cardiovascular;  Laterality: N/A;  . LUMBAR DISC SURGERY  early 2000's  . TEE WITHOUT CARDIOVERSION N/A 08/22/2016   Procedure: TRANSESOPHAGEAL ECHOCARDIOGRAM (TEE);  Surgeon: Sanda Klein, MD;  Location: [facility];  Service: Cardiovascular;  Laterality: N/A;  . TIBIA IM NAIL INSERTION Right 03/30/2014   Procedure: INTRAMEDULLARY (IM) NAIL TIBIAL;  Surgeon: Wylene Simmer, MD;  Location: [facility];  Service: Orthopedics;  Laterality: Right;  . US ECHOCARDIOGRAPHY  12/17/2007   EF 55-60%  . VAGINAL HYSTERECTOMY  1980's     OB History   None      Home Medications     Prior to Admission medications   Medication Sig Start Date End Date Taking? Authorizing Provider  albuterol (PROVENTIL HFA;VENTOLIN HFA) 108 (90 Base) MCG/ACT inhaler Inhale 2 puffs into the lungs every 6 (six) hours as needed for wheezing or shortness of breath. 01/22/17   Mannam, Hart Robinsons, MD  ARIPiprazole (ABILIFY) 2 MG tablet Take 1 tablet by mouth daily. 01/10/18   [provider]  buPROPion (WELLBUTRIN XL) 150 MG 24 hr tablet Take 450 mg by mouth daily.     [provider]  busPIRone (BUSPAR) 10 MG tablet Take 10 mg by mouth 2 (two) times daily.     [provider]  Cholecalciferol (VITAMIN D3) 2000 units TABS Take 2,000 Units by mouth daily.    [provider]  Fluticasone-Umeclidin-Vilant (TRELEGY ELLIPTA) 100-62.5-25 MCG/INH AEPB Inhale 1 puff into the lungs daily. 09/13/17   Mannam, Hart Robinsons, MD  folic acid (FOLVITE) 1 MG tablet Take 1 tablet (1 mg total) by mouth daily. 08/26/16   Elgergawy, Silver Huguenin, MD  furosemide (LASIX) 40 MG tablet Take 1 tablet (40 mg total) by mouth daily. 08/26/16   Elgergawy, Silver Huguenin, MD  hydrOXYzine (ATARAX/VISTARIL) 25 MG tablet Take 25 mg by mouth every 8 (eight) hours as needed.    [provider]  levothyroxine (SYNTHROID, LEVOTHROID) 25 MCG tablet Take 25 mcg by mouth daily before breakfast.  08/31/14   [provider]  omeprazole (PRILOSEC) 20 MG capsule Take 20 mg by mouth daily.    [provider]  oxyCODONE (ROXICODONE) 15 MG immediate release tablet Take 15 mg by mouth every 6 (six) hours as needed for pain.    [provider]  OXYGEN Inhale 2 L into the lungs as needed.    [provider]  potassium chloride SA (K-DUR,KLOR-CON) 20 MEQ tablet Take 1 tablet (20 mEq total) by mouth daily. 08/26/16   Elgergawy, Silver Huguenin, MD  pramipexole (MIRAPEX) 0.25 MG tablet  02/11/17   [provider]  SYMBICORT 160-4.5 MCG/ACT inhaler TAKE 2 PUFFS BY MOUTH TWICE A DAY 02/06/18   Mannam,  Praveen, MD  traZODone (DESYREL) 150 MG tablet Take 300 mg by mouth at bedtime.  08/31/14   [provider]  vitamin B-12 (CYANOCOBALAMIN) 1000 MCG tablet Take 1,000 mcg by mouth daily.    [provider]    Family History Family History  Problem Relation Age of Onset  . Heart attack Father   . Obesity Sister   . Emphysema Sister   . Heart disease Brother     Social History Social History   Tobacco Use  . Smoking status: Former Smoker    Packs/day: 0.10    Years: 55.00    Pack years: 5.50    Types: Cigarettes    Last attempt to quit: 08/19/2016    Years since quitting: 1.9  . Smokeless  tobacco: Former Systems developer    Quit date: 08/13/2016  . Tobacco comment: Encouraged to remain smoke free  Substance Use Topics  . Alcohol use: No    Alcohol/week: 0.0 standard drinks  . Drug use: No     Allergies   Cephalexin; Cymbalta [duloxetine hcl]; and Latex   Review of Systems Review of Systems  Respiratory: Positive for shortness of breath.   Musculoskeletal:       Rib pain  All other systems reviewed and are negative.    Physical Exam Updated Vital Signs BP (!) 120/55   Pulse 93   Resp 15   Ht 5\' 9"  (1.753 m)   Wt 76.2 kg   SpO2 100%   BMI 24.81 kg/m   Physical Exam  Constitutional: She is oriented to person, place, and time. She appears well-developed and well-nourished. No distress.  HENT:  Head: Normocephalic and atraumatic.  Right Ear: Hearing normal.  Left Ear: Hearing normal.  Nose: Nose normal.  Mouth/Throat: Oropharynx is clear and moist and mucous membranes are normal.  Eyes: Pupils are equal, round, and reactive to light. Conjunctivae and EOM are normal.  Neck: Normal range of motion. Neck supple.  Cardiovascular: Regular rhythm, S1 normal and S2 normal. Exam reveals no gallop and no friction rub.  No murmur heard. Pulmonary/Chest: Effort normal and breath sounds normal. No respiratory distress. She exhibits tenderness.    Abdominal:  Soft. Normal appearance and bowel sounds are normal. There is no hepatosplenomegaly. There is no tenderness. There is no rebound, no guarding, no tenderness at McBurney's point and negative Murphy's sign. No hernia.  Musculoskeletal: Normal range of motion.  Neurological: She is alert and oriented to person, place, and time. She has normal strength. No cranial nerve deficit or sensory deficit. Coordination normal. GCS eye subscore is 4. GCS verbal subscore is 5. GCS motor subscore is 6.  Skin: Skin is warm, dry and intact. No rash noted. No cyanosis.  Psychiatric: She has a normal mood and affect. Her speech is normal and behavior is normal. Thought content normal.  Nursing note and vitals reviewed.    ED Treatments / Results  Labs (all labs ordered are listed, but only abnormal results are displayed) Labs Reviewed  CBC WITH DIFFERENTIAL/PLATELET - Abnormal; Notable for the following components:      Result Value   Hemoglobin 11.3 (*)    MCH 24.9 (*)    MCHC 29.3 (*)    All other components within normal limits  BASIC METABOLIC PANEL - Abnormal; Notable for the following components:   Glucose, Bld 115 (*)    Creatinine, Ser 1.28 (*)    GFR calc non Af Amer 41 (*)    GFR calc Af Amer 48 (*)    All other components within normal limits    EKG None  Radiology Dg Ribs Unilateral W/chest Left  Result Date: 07/23/2018 CLINICAL DATA:  LEFT rib pain after fall 3 days ago. History of chronic bronchitis/COPD. EXAM: LEFT RIBS AND CHEST - 3+ VIEW COMPARISON:  Chest radiograph November 03, 2016 FINDINGS: Mild chronic interstitial changes. Increased lung volumes. LEFT mid lung zone granuloma. No pleural effusion or focal consolidation. Cardiomediastinal silhouette is normal. Calcified aortic arch. Biapical pleural thickening. No pneumothorax. Osteopenia. No rib fracture deformity. Foreshortened distal RIGHT clavicle, likely postoperative. IMPRESSION: 1. No acute cardiopulmonary process. 2. No rib  fracture deformity. Osteopenia decreases sensitivity for acute nondisplaced fractures. 3.  Emphysema (ICD10-J43.9).  Aortic Atherosclerosis (ICD10-I70.0). Electronically Signed   By: Sandie Ano  Bloomer M.D.   On: 07/23/2018 05:36    Procedures Procedures (including critical care time)  Medications Ordered in ED Medications  albuterol (PROVENTIL) (2.5 MG/3ML) 0.083% nebulizer solution 2.5 mg (2.5 mg Nebulization Not Given 07/23/18 0640)  ipratropium-albuterol (DUONEB) 0.5-2.5 (3) MG/3ML nebulizer solution 3 mL (3 mLs Nebulization Given 07/23/18 8280)  oxyCODONE-acetaminophen (PERCOCET/ROXICET) 5-325 MG per tablet 2 tablet (2 tablets Oral Given 07/23/18 0349)     Initial Impression / Assessment and Plan / ED Course  I have reviewed the triage vital signs and the nursing notes.  Pertinent labs & imaging results that were available during my care of the patient were reviewed by me and considered in my medical decision making (see chart for details).     Patient presents to the emergency department for evaluation of left rib pain.  Patient reports a fall several days ago off the toilet, hitting her side on the bathtub.  No head injury or loss of consciousness.  Patient does have a history of COPD, has mild shortness of breath but is near her baseline.  She reports pain with movement.  She does take oxycodone as chronic medication as an outpatient.  Patient does have some wheezing on arrival, consistent with her known history of COPD.  She was treated with bronchodilator therapy.  Patient's x-ray does not show any contusion, pneumothorax.  There is no obvious rib fracture noted.  Patient had improvement with fentanyl at arrival to the ER, as this wore off she had increased pain and was administered oxycodone.   Patient still wheezing after bronchodilator therapy.  She does have oxygen at home, but normally does not use oxygen regularly.  Oxygen saturations are in the 80s, at rest.  She is complaining  of severe pain with movement, splinting with her breathing, will require hospitalization for treatment for COPD and pain control for rib contusion, possible occult rib fracture.  Final Clinical Impressions(s) / ED Diagnoses   Final diagnoses:  Contusion of left chest wall, initial encounter  Chronic obstructive pulmonary disease, unspecified COPD type [facility])    ED Discharge Orders    None       Orpah Greek, MD 07/23/18 1791    Orpah Greek, MD 07/23/18 [phone]

## 2018-07-23 NOTE — ED Notes (Signed)
Pt back from x-ray.

## 2018-07-23 NOTE — ED Triage Notes (Signed)
BIB [facility] from home with c/o of fall off of toilet, hitting left ribs on bath tub. Received 200 mcg Fentanyl in route.

## 2018-07-23 NOTE — H&P (Addendum)
Date: 07/23/2018               Patient Name:  Traci [name] MRN: [id number]  DOB: [birth year] Age / Sex: 72 y.o., female   PCP: Raelyn Number, MD         Medical Service: Internal Medicine Teaching Service         Attending Physician: Dr. Sid Falcon, MD    First Contact: Dr. Donne Hazel  Pager: [phone]  Second Contact: Dr. Maricela Bo  Pager: [phone]       After Hours (After 5p/  First Contact Pager: [phone]  weekends / holidays): Second Contact Pager: [phone]   Chief Complaint: chest pain   History of Present Illness:   71 year old woman with history of COPD Gold Stage B ( on supplemental oxygen PRN for shortness of breath), aortic regurgitation, non obstructive coronary disease, HTN, hx of tobacco use, anxiety and depression. About four days ago she suffered a fall while trying to stand up from the commode, she lost her footing and slipped and hit her side on the sink. Mechanical falls are common for Traci [name], she does not use an assistive walking device but does struggle with weakness and falls on occasion. Since the fall she has had a pain in has found no relief for a pain in her left chest wall so she called EMS today. En route she received a pain medication which worked to relieve the pain mostly but the pain returned in the ED. Chronic cough productive of sputum and dyspnea on exertion are at baseline. She describes having exertional chest pain on occasion prior to this episode of chest wall pain. She has never had chest pain like this in the past. At home she has had diaphoresis and nausea related to the chest pain.   Meds:  Current Meds  Medication Sig  . albuterol (PROVENTIL HFA;VENTOLIN HFA) 108 (90 Base) MCG/ACT inhaler Inhale 2 puffs into the lungs every 6 (six) hours as needed for wheezing or shortness of breath.  . ARIPiprazole (ABILIFY) 2 MG tablet Take 1 tablet by mouth daily.  Marland Kitchen buPROPion (WELLBUTRIN XL) 150 MG 24 hr tablet Take 450 mg by mouth daily.   .  Cholecalciferol (VITAMIN D3) 2000 units TABS Take 2,000 Units by mouth daily.  . Fluticasone-Umeclidin-Vilant (TRELEGY ELLIPTA) 100-62.5-25 MCG/INH AEPB Inhale 1 puff into the lungs daily.  . folic acid (FOLVITE) 1 MG tablet Take 1 tablet (1 mg total) by mouth daily.  . hydrOXYzine (ATARAX/VISTARIL) 25 MG tablet Take 25 mg by mouth every 8 (eight) hours as needed for anxiety.   Marland Kitchen levothyroxine (SYNTHROID, LEVOTHROID) 25 MCG tablet Take 25 mcg by mouth daily before breakfast.   . omeprazole (PRILOSEC) 20 MG capsule Take 20 mg by mouth daily.  Marland Kitchen oxyCODONE (ROXICODONE) 15 MG immediate release tablet Take 15 mg by mouth every 6 (six) hours as needed for pain.  . OXYGEN Inhale 2 L into the lungs as needed.  . potassium chloride SA (K-DUR,KLOR-CON) 20 MEQ tablet Take 1 tablet (20 mEq total) by mouth daily.  . SYMBICORT 160-4.5 MCG/ACT inhaler TAKE 2 PUFFS BY MOUTH TWICE A DAY (Patient taking differently: Inhale 2 puffs into the lungs 2 (two) times daily. )  . traZODone (DESYREL) 150 MG tablet Take 300 mg by mouth at bedtime as needed for sleep.   . vitamin B-12 (CYANOCOBALAMIN) 1000 MCG tablet Take 1,000 mcg by mouth daily.  . [DISCONTINUED] omeprazole (PRILOSEC) 20 MG capsule Take  20 mg by mouth daily.    Allergies: Allergies as of 07/23/2018 - Review Complete 07/23/2018  Allergen Reaction Noted  . Cephalexin Hives   . Cymbalta [duloxetine hcl] Other (See Comments) 07/13/2014  . Latex  03/26/2014   Past Medical History:  Diagnosis Date  . Anginal pain ([place])   . Anxiety   . Arthritis   . Chronic bronchitis ([place])   . Chronic lower back pain   . COPD (chronic obstructive pulmonary disease) ([place])   . Depression   . Dyspepsia   . GERD (gastroesophageal reflux disease)   . H/O hiatal hernia   . ZOXWRUEA(540.9)    "1-2/week" (08/16/2016)  . History of blood transfusion 1963   w/childbirth  . Hyperlipidemia   . Hypertension   . Hypothyroidism   . Shortness of breath 02/12/12   "all the  time"  . Tremor     Family History: multiple myocardial infarction in numerous family members, the first for each member is mother at age 28, sister at age 71, sister at age 30, and father at 50  Social History: Lives in a home with her husband. Retired Audiological scientist. Does not drink alcohol, 20 pack year smoking history quit 1 year ago, denies illicit substance use   Review of Systems: A complete ROS was negative except as per HPI.   Physical Exam: Blood pressure 103/86, pulse 93, temperature 98.8 F (37.1 C), temperature source Oral, resp. rate 18, height 5\' 9"  (1.753 m), weight 76.2 kg, SpO2 95 %. General: uncomfortable appearing, intermittent writhing in pain  HENT: dry mucous membranes  Eyes: non icteric, conjunctiva are not pale  Cardiac: regular rate and rhythm, no murmurs, no peripheral edema  Pulm: normal work of breathing, no wheezing or rhonchi with auscultation over the anterior chest wall, tender to palpation of the left lower ribs GI: bowel sounds delayed, abdomen is soft, tender to palpation in the left upper q with guarding  Skin: no bruising noted over the left upper quadrant or left lower ribs.  Psych: normal affect, conversational   Assessment & Plan by Problem: Active Problems:   AKI (acute kidney injury) ([place])   Fall  Chest pain  Patient presents with chest pain and chest wall tenderness after a fall. Rib films did not reveal rib fracture. Left upper quadrant abdominal tenderness was also appreciated on exam, last CT scan of the abdomen is from 2013 showed bl adrenal incidentalinomas and a normal spleen. She says this abdominal pain has been ongoing for months but I am concerned for splenic injury given the severity of her tenderness and will obtain a splenic ultrasound. She is at risk for coronary etiology of pain given family and personal history as well so will work this up as well.  - serial troponins and EKG  - takes oxycodone 15 mg QID at home, I have  ordered percocet 15 mg q 6 hours scheduled  - fentanyl 12.5 mg q3 H PRN for breakthrough  - follow up splenic ultrasound, consider CT abdomen with contrast to evaluate for spleen if she has rapid change in hemoglobin   Acute kidney injury Creatinine is 1.28 up from 1.08 when last checked 6 months ago. Patient reports reduced appetite and PO intake over the past few days. Will give a small fluid bolus given her severe aortic regurgitation and reevaluate creatinine tomorrow.   Dispo: Admit patient to Observation with expected length of stay less than 2 midnights.  Signed: Ledell Noss, MD 07/23/2018, 3:44 PM  Pager: [phone]

## 2018-07-23 NOTE — Progress Notes (Signed)
PT Cancellation Note  Patient Details Name: Traci [name] MRN: [id number] DOB: 29-Mar-1946   Cancelled Treatment:    Reason Eval/Treat Not Completed: Other (comment) patient currently eating lunch and per RN in quite a bit of pain. Will attempt to try back later if time/schedule allow.    Deniece Ree PT, DPT, CBIS  Supplemental Physical Therapist [facility]    Pager [phone] Acute Rehab Office [phone]

## 2018-07-24 DIAGNOSIS — J449 Chronic obstructive pulmonary disease, unspecified: Secondary | ICD-10-CM | POA: Diagnosis not present

## 2018-07-24 DIAGNOSIS — Z79899 Other long term (current) drug therapy: Secondary | ICD-10-CM

## 2018-07-24 DIAGNOSIS — I2510 Atherosclerotic heart disease of native coronary artery without angina pectoris: Secondary | ICD-10-CM | POA: Diagnosis not present

## 2018-07-24 DIAGNOSIS — Z79891 Long term (current) use of opiate analgesic: Secondary | ICD-10-CM

## 2018-07-24 DIAGNOSIS — R0789 Other chest pain: Secondary | ICD-10-CM | POA: Diagnosis not present

## 2018-07-24 DIAGNOSIS — S20212A Contusion of left front wall of thorax, initial encounter: Secondary | ICD-10-CM | POA: Diagnosis not present

## 2018-07-24 DIAGNOSIS — I10 Essential (primary) hypertension: Secondary | ICD-10-CM | POA: Diagnosis not present

## 2018-07-24 LAB — CBC
HCT: 33.4 % — ABNORMAL LOW (ref 36.0–46.0)
Hemoglobin: 10 g/dL — ABNORMAL LOW (ref 12.0–15.0)
MCH: 25.2 pg — ABNORMAL LOW (ref 26.0–34.0)
MCHC: 29.9 g/dL — ABNORMAL LOW (ref 30.0–36.0)
MCV: 84.1 fL (ref 80.0–100.0)
Platelets: 197 10*3/uL (ref 150–400)
RBC: 3.97 MIL/uL (ref 3.87–5.11)
RDW: 14.6 % (ref 11.5–15.5)
WBC: 5.8 10*3/uL (ref 4.0–10.5)
nRBC: 0 % (ref 0.0–0.2)

## 2018-07-24 LAB — TROPONIN I: Troponin I: <0.03 ng/mL (ref <0.03)

## 2018-07-24 LAB — BASIC METABOLIC PANEL
Anion gap: 10 (ref 5–15)
BUN: 13 mg/dL (ref 8–23)
CO2: 24 mmol/L (ref 22–32)
Calcium: 8.9 mg/dL (ref 8.9–10.3)
Chloride: 105 mmol/L (ref 98–111)
Creatinine, Ser: 1.03 mg/dL — ABNORMAL HIGH (ref 0.44–1.00)
GFR calc Af Amer: >60 mL/min (ref >60)
GFR calc non Af Amer: 54 mL/min — ABNORMAL LOW (ref >60)
Glucose, Bld: 98 mg/dL (ref 70–99)
Potassium: 4.5 mmol/L (ref 3.5–5.1)
Sodium: 139 mmol/L (ref 135–145)

## 2018-07-24 MED ORDER — DICLOFENAC SODIUM 1 % TD GEL
2.0000 g | Freq: Every day | TRANSDERMAL | Status: DC | PRN
  Filled 2018-07-24 (×2): qty 100

## 2018-07-24 NOTE — Care Management Obs Status (Addendum)
[place] NOTIFICATION   Patient Details  Name: Traci [name] MRN: [id number] Date of Birth: 14-Jan-1946   Medicare Observation Status Notification Given:  Yes  Patient was unable to see how to sign the OBS notice, with permission given to sign by the patient.  Midge Minium RN, BSN, NCM-BC, ACM-RN [phone] 07/24/2018, 3:43 PM

## 2018-07-24 NOTE — Evaluation (Signed)
Physical Therapy Evaluation Patient Details Name: Traci [name] MRN: [id number] DOB: [birth year] Today's Date: 07/24/2018   History of Present Illness  Pt is a 72 y/o female who presents s/p fall at home 4 days prior to presentation at ED with worsening pain. X-ray negative for rib fractures. PMH significant for emphysema, CAD, HTN, frequent falls, COPD on PRN supplemental O2.  Clinical Impression  Pt admitted with above diagnosis. Pt currently with functional limitations due to the deficits listed below (see PT Problem List). At the time of PT eval pt was able to perform transfers and ambulation with significant increased time and heavy min assist for balance support and safety with RW use. Attempted to perform bed mobility with HOB flat to simulate home environment, however pt could not even attempt to roll unless HOB was at least at 40 due to pain. Pt states she does have a recliner she can sleep in at home until pain is better controlled. Pt only able to tolerate ~6 feet of walking at a time with RW for support. Do not feel pt could tolerate stair training today or car transfer for transport home. Recommend another PT session to progress mobility prior to d/c if possible. Pt will benefit from skilled PT to increase their independence and safety with mobility to allow discharge to the venue listed below.      Follow Up Recommendations Home health PT;Supervision for mobility/OOB    Equipment Recommendations  Rolling walker with 5" wheels (has rollator - needs more stability so asking for RW)   Recommendations for Other Services       Precautions / Restrictions Precautions Precautions: Fall Precaution Comments: Severe L flank pain consistent with ribs Restrictions Weight Bearing Restrictions: No      Mobility  Bed Mobility Overal bed mobility: Needs Assistance Bed Mobility: Rolling;Sidelying to Sit Rolling: Min assist Sidelying to sit: Min assist       General bed mobility  comments: Pt absolutely could not attempt bed mobility with HOB lowered due to pain. Attempted several times and was too painful to continue. With HOB elevated to 40 pt was able to roll to the R and sit up with min assist and heavy use of rails.   Transfers Overall transfer level: Needs assistance Equipment used: Rolling walker (2 wheeled) Transfers: Sit to/from Stand Sit to Stand: Min assist         General transfer comment: Assist for power-up to full stand. VC's for hand placement on seated surface for safety. Pt was educated on safety awareness and need to keep walker with her until she is ready to reach back to sit down.   Ambulation/Gait Ambulation/Gait assistance: Min assist Gait Distance (Feet): 16 Feet(8' into bathroom and 8' back to bed. ) Assistive device: Rolling walker (2 wheeled) Gait Pattern/deviations: Shuffle;Trunk flexed Gait velocity: Decreased Gait velocity interpretation: <1.31 ft/sec, indicative of household ambulator General Gait Details: Slow, guarded, and painful. Assist provided for walker management and balance support.   Stairs            Wheelchair Mobility    Modified Rankin (Stroke Patients Only)       Balance Overall balance assessment: Needs assistance Sitting-balance support: Feet supported;No upper extremity supported Sitting balance-Leahy Scale: Poor Sitting balance - Comments: UE support required due to pain   Standing balance support: Bilateral upper extremity supported;During functional activity Standing balance-Leahy Scale: Poor Standing balance comment: Reliant on UE support for standing balance  Pertinent Vitals/Pain Pain Assessment: 0-10 Pain Score: 6  Pain Location: L rib area from armpit down - 6/10 at rest, 10/10 when moving Pain Descriptors / Indicators: Sharp Pain Intervention(s): Limited activity within patient's tolerance;Monitored during session;Repositioned    Home Living  Family/patient expects to be discharged to:: Private residence Living Arrangements: Spouse/significant other Available Help at Discharge: Family;Available 24 hours/day Type of Home: Mobile home Home Access: Stairs to enter Entrance Stairs-Rails: Right;Left;Can reach both(In back. No rails in the front) Entrance Stairs-Number of Steps: 2 in the front, 5 in the back Home Layout: One level Home Equipment: Walker - 4 wheels;Bedside commode;Shower seat;Grab bars - tub/shower      Prior Function Level of Independence: Independent with assistive device(s)         Comments: Used the rollator all the time. Has not driven in 3 years. Husband usually does all the shopping.      Hand Dominance   Dominant Hand: Right    Extremity/Trunk Assessment   Upper Extremity Assessment Upper Extremity Assessment: Overall WFL for tasks assessed;LUE deficits/detail LUE Deficits / Details: ROM limited as pt with increased L side pain at ribs with shoulder elevation.     Lower Extremity Assessment Lower Extremity Assessment: Generalized weakness    Cervical / Trunk Assessment Cervical / Trunk Assessment: Kyphotic;Other exceptions Cervical / Trunk Exceptions: Significant pain with movement and palpation of L rib area  Communication   Communication: No difficulties  Cognition Arousal/Alertness: Awake/alert Behavior During Therapy: WFL for tasks assessed/performed Overall Cognitive Status: Within Functional Limits for tasks assessed                                        General Comments      Exercises     Assessment/Plan    PT Assessment    PT Problem List         PT Treatment Interventions      PT Goals (Current goals can be found in the Care Plan section)  Acute Rehab PT Goals Patient Stated Goal: Decrease pain; be able to move without pain PT Goal Formulation: With patient Time For Goal Achievement: 07/31/18 Potential to Achieve Goals: Good    Frequency Min  3X/week   Barriers to discharge        Co-evaluation               AM-PAC PT "6 Clicks" Mobility  Outcome Measure                  End of Session Equipment Utilized During Treatment: Other (comment)(Gait belt deferred due to rib pain) Activity Tolerance: Patient limited by pain Patient left: in bed;with call bell/phone within reach;with bed alarm set Nurse Communication: Mobility status PT Visit Diagnosis: Unsteadiness on feet (R26.81);Pain;Muscle weakness (generalized) (M62.81);Difficulty in walking, not elsewhere classified (R26.2) Pain - Right/Left: Left Pain - part of body: (Ribs)    Time: 2595-6387 PT Time Calculation (min) (ACUTE ONLY): 44 min   Charges:   PT Evaluation $PT Eval Moderate Complexity: 1 Mod PT Treatments $Gait Training: 23-37 mins        Rolinda Roan, PT, DPT Acute Rehabilitation Services Pager: [phone] Office: [phone]   Thelma Comp 07/24/2018, 3:05 PM

## 2018-07-24 NOTE — Progress Notes (Signed)
Pt complains of pain with new IV.  Will remove per request of patient.  Patient refusing new IV start.

## 2018-07-24 NOTE — Progress Notes (Signed)
Paged PT in regards to patient discharge.

## 2018-07-24 NOTE — Progress Notes (Signed)
Pt and daughter are in agreement with pt staying an additional night to have a follow-up PT evaluation.

## 2018-07-24 NOTE — Progress Notes (Addendum)
  Date: 07/24/2018  Patient name: Traci [name]  Medical record number: [id number]  Date of birth: 1946/01/19   I have seen and evaluated this patient and I have discussed the plan of care with the house staff. Please see Dr. Cristal Generous note for complete details. I concur with her findings with the following additions/corrections:   If patient is able to ambulate safely with PT, she will be ready for discharge today.  PT eval pending.    ADDENDUM: Discussed with PT; patient having significant difficulty ambulating, will need another session of PT and set up of home health PT prior to discharge.  This will be done tomorrow and anticipate discharge tomorrow.   Sid Falcon, MD 07/24/2018, 2:37 PM

## 2018-07-24 NOTE — Care Management Note (Addendum)
Case Management Note  Patient Details  Name: Traci [name] MRN: [id number] Date of Birth: 11/24/1945  Subjective/Objective:  72 yo female presented with AKI and contusion of left chest wall resulting from a fall.             Action/Plan: CM met with patient to discuss dispositional needs. Patient lived at home with her spouse, independent with ADLs, ambulated with a rollator and stated she also has a RW, BSC, shower seat, grab bars and supplemental home O2. PCP verified as: Dr. Celedonio Miyamoto; pharmacy of choice: [facility] [place]. CM discussed PT eval and recommendations for HHPT, with patient agreeable. CMS HH compare choice provided, with [facility] selected. [facility] selected for [facility]. DME referral given to Jeneen Rinks [facility] liaison. [place] referral given to Butch Penny [facility] liaison; AVS updated. Patients family will provide transportation home, with patient stating she has a portable tank in her car. No further needs from CM.   Expected Discharge Date:  07/24/18               Expected Discharge Plan:  [place]  In-House Referral:  NA  Discharge planning Services  CM Consult  Post Acute Care Choice:  Home Health, Durable Medical Equipment Choice offered to:  Patient  DME Arranged:  Walker rolling DME Agency:  [place]:  PT [facility] Agency:  [place]  Status of Service:  Completed, signed off  If discussed at [place] of Stay Meetings, dates discussed:    Additional Comments:  Midge Minium RN, BSN, NCM-BC, ACM-RN [phone] 07/24/2018, 3:48 PM

## 2018-07-24 NOTE — Discharge Summary (Signed)
Name: Traci [name] MRN: [id number] DOB: [birth year] 72 y.o. PCP: Raelyn Number, MD  Date of Admission: 07/23/2018  4:27 AM Date of Discharge: 07/25/18 Attending Physician: Sid Falcon, MD  Discharge Diagnosis: 1. Chest wall contusion  2. Frequent falls 2/2 polypharmacy  3. AKI  Discharge Medications: Allergies as of 07/25/2018      Reactions   Cephalexin Hives   Cymbalta [duloxetine Hcl] Other (See Comments)   Sees spiders   Latex    " MAKES ME ITCHY "       Medication List    STOP taking these medications   furosemide 40 MG tablet Commonly known as:  LASIX   hydrOXYzine 25 MG tablet Commonly known as:  ATARAX/VISTARIL     TAKE these medications   albuterol 108 (90 Base) MCG/ACT inhaler Commonly known as:  PROVENTIL HFA;VENTOLIN HFA Inhale 2 puffs into the lungs every 6 (six) hours as needed for wheezing or shortness of breath.   ARIPiprazole 2 MG tablet Commonly known as:  ABILIFY Take 1 tablet by mouth daily.   buPROPion 150 MG 24 hr tablet Commonly known as:  WELLBUTRIN XL Take 450 mg by mouth daily.   Fluticasone-Umeclidin-Vilant 100-62.5-25 MCG/INH Aepb Inhale 1 puff into the lungs daily.   folic acid 1 MG tablet Commonly known as:  FOLVITE Take 1 tablet (1 mg total) by mouth daily.   levothyroxine 25 MCG tablet Commonly known as:  SYNTHROID, LEVOTHROID Take 25 mcg by mouth daily before breakfast.   omeprazole 20 MG capsule Commonly known as:  PRILOSEC Take 20 mg by mouth daily.   oxyCODONE 15 MG immediate release tablet Commonly known as:  ROXICODONE Take 15 mg by mouth every 6 (six) hours as needed for pain.   OXYGEN Inhale 2 L into the lungs as needed.   potassium chloride SA 20 MEQ tablet Commonly known as:  K-DUR,KLOR-CON Take 1 tablet (20 mEq total) by mouth daily.   SYMBICORT 160-4.5 MCG/ACT inhaler Generic drug:  budesonide-formoterol TAKE 2 PUFFS BY MOUTH TWICE A DAY What changed:  See the new instructions.   traZODone  150 MG tablet Commonly known as:  DESYREL Take 300 mg by mouth at bedtime as needed for sleep.   vitamin B-12 1000 MCG tablet Commonly known as:  CYANOCOBALAMIN Take 1,000 mcg by mouth daily.   Vitamin D3 50 MCG (2000 UT) Tabs Take 2,000 Units by mouth daily.            Durable Medical Equipment  (From admission, onward)         Start     Ordered   07/24/18 1544  For home use only DME Walker rolling  [facility])  Once    Comments:  5 inch wheels  Question:  Patient needs a walker to treat with the following condition  Answer:  Physical deconditioning   07/24/18 1544          Disposition and follow-up:   Traci [name] was discharged from [facility] in Stable condition.  At the hospital follow up visit please address:  1.  Polypharmacy in geriatric population: Given frequent falls, consider discontinuing Beer's criteria medications (trazodone, oxycodone, hydroxyzine, Abilify, bupropion). We discontinued hydroxyzine at discharge. HH PT was arranged  Chest wall pain: likely 2/2 to chest wall contusion vs. Occult rib fracture. Is her pain improving?   2.  Labs / imaging needed at time of follow-up: none  3.  Pending labs/ test needing follow-up: none  Follow-up Appointments: Follow-up Information    Raelyn Number, MD. Schedule an appointment as soon as possible for a visit in 1 week(s).   Specialty:  Internal Medicine Contact information: [address] [place] [address] [phone]        [facility] Follow up.   Specialty:  Home Health Services Why:  Home Health Physical Therapy Contact information: [place] [address] [place] Follow up.   Why:  rolling walker Contact information: [address] [place] [address] [phone]           Hospital Course by problem list: 72 year old female with COPD on PRN supplemental  oxygen, CAD, hypertension, depression, frequent falls who presented with 4 days of worsening left chest wall pain that began after falling in the bathroom.  Denies syncope or loss of consciousness.    1.  Chest wall pain: Initially described as substernal chest pain.  Given her cardiac risk factors, there were concerns for ACS.  However, troponins and EKGs were reassuring.  She had some mild bruising over the left ribs.  Chest x-ray did not show evidence of acute fracture and left upper quadrant ultrasound was negative for splenic injury.  Hemoglobin was stable overnight.  Pain was treated with home Percocet and voltaren gel.  2.  AKI: Prerenal.  Creatinine 1.3 on admission and improved to 1.0 with IV fluids  3.  Polypharmacy: After administration of Percocet, Traci [name] was observed to have acute confusion and worsening mental status.  This improved with observation.  On medication review, she has several beers criteria medicines listed -trazodone, oxycodone, hydroxyzine, Abilify, bupropion.  We discussed this briefly and recommend outpatient follow-up for discontinuation of medicines.  Discharge Vitals:   BP (!) 120/59 (BP Location: Left Arm)   Pulse 85   Temp 98.5 F (36.9 C) (Oral)   Resp 19   Ht 5\' 9"  (1.753 m)   Wt 76.2 kg   SpO2 94%   BMI 24.81 kg/m   Pertinent Labs, Studies, and Procedures:  CLINICAL DATA:  LEFT rib pain after fall 3 days ago. History of chronic bronchitis/COPD.  EXAM: LEFT RIBS AND CHEST - 3+ VIEW  COMPARISON:  Chest radiograph November 03, 2016  FINDINGS: Mild chronic interstitial changes. Increased lung volumes. LEFT mid lung zone granuloma. No pleural effusion or focal consolidation. Cardiomediastinal silhouette is normal. Calcified aortic arch. Biapical pleural thickening. No pneumothorax. Osteopenia. No rib fracture deformity. Foreshortened distal RIGHT clavicle, likely postoperative.  IMPRESSION: 1. No acute cardiopulmonary process. 2. No rib  fracture deformity. Osteopenia decreases sensitivity for acute nondisplaced fractures. 3.  Emphysema (ICD10-J43.9).  Aortic Atherosclerosis (ICD10-I70.0).  CLINICAL DATA:  Left upper quadrant pain since the patient fell off the toilet 2 days ago.  EXAM: ULTRASOUND ABDOMEN LIMITED  COMPARISON:  None.  FINDINGS: No perisplenic fluid or focal splenic lesion is identified. The spleen measures 8.7 x 9.8 x 6.1 cm for a volume of 271.7 mL.  IMPRESSION: Normal exam.   Discharge Instructions: Discharge Instructions    Diet - low sodium heart healthy   Complete by:  As directed    Discharge instructions   Complete by:  As directed    Traci [name],  You were admitted to the hospital because you are having so much pain in your side and chest that it is making it difficult for you to breathe.  Given your chest pain, we  are also concerned that something was wrong with your heart.  Thankfully all the test on your heart are normal.  The x-rays we did did not show any evidence of rib fracture and the ultrasound did not show any damage to your spleen. However, given the amount of pain you're in, you might still have a rib fracture that we just cannot see on imaging.  Take it easy over the next several days and take pain medicine as needed. If you want, you can buy a rib belt or abdominal binder to wear and that might help decrease your pain. If your pain worsens or you have difficulty breathing or new/different chest pain please come back to the hospital.  Please follow-up with your primary care provider in the next week or two. There are several medicines that you are taking at home which could be increasing your risk of falls (trazodone, percocet, bupropion (Wellbutrin), aripiprazole (abilify), hydroxyzine (atarax). I would recommend to go ahead and stop taking hydroxyzine (atarax). For the other medicines, it is important to follow up with your primary doctor to discuss stopping some of these  medicines because they may need to be stopped slowly.   Increase activity slowly   Complete by:  As directed       Signed: Isabelle Course, MD 07/25/2018, 1:10 PM   Pager: [phone]

## 2018-07-24 NOTE — Progress Notes (Signed)
Pt requesting IV removal, stating it was painful.  IV flushed with no issues. Removed IV per pt request.  Will consult IV team for new placement.

## 2018-07-24 NOTE — Progress Notes (Signed)
   Subjective: continues to endorse pain in the right lateral chest wall, worse with movement and breathing. Denies sob or substernal chest pain.   Objective:  Vital signs in last 24 hours: Vitals:   07/23/18 1833 07/23/18 2022 07/23/18 2332 07/24/18 0631  BP: (!) 115/45  (!) 110/54 (!) 116/51  Pulse: 85  82 80  Resp: 16  20 18   Temp: 98 F (36.7 C)  (!) 97.5 F (36.4 C) 97.8 F (36.6 C)  TempSrc: Oral  Oral Oral  SpO2: 95% 95% 100% 97%  Weight:      Height:       Gen: sitting up in bed, NAD Cardiac: RRR, no m/r/g Pulm: CTAB MSK: TTP over left lateral chest wall, worse below the left breast. No bruising.   Assessment/Plan:  Active Problems:   AKI (acute kidney injury) ([place])   Fall   Abdominal pain   Contusion of left chest wall  72 year old female with COPD on PRN supplemental oxygen, CAD, hypertension, depression, frequent falls who presented with 4 days of worsening left chest wall pain that began after falling in the bathroom.  Denies syncope or loss of consciousness.    1. Chest wall pain: Initially described as substernal chest pain. Troponins have been negative x3 and EKG without ischemic changes. CXR did not show rib fracture. Splenic ultrasound was normal.  - hemoglobin stable overnight  - add voltaren gel  - continue home percocet  - PT eval  2. AKI: resolved with IVFs. Creatine 1.0, from 1.3  3. Polypharmacy: Probably contributing to her frequent falls. Discussed several Beer's criteria medications including trazodone, oxycodone, hydroxyzine, Abilify, bupropion. Ms. Mcnicholas states that her PCP has already done a med rec and recently discontinued several medications.  - f/u with PCP    Dispo: Anticipated discharge today pending PT eval   Isabelle Course, MD 07/24/2018, 9:47 AM Pager: [phone]

## 2018-07-25 DIAGNOSIS — S20219A Contusion of unspecified front wall of thorax, initial encounter: Secondary | ICD-10-CM

## 2018-07-25 DIAGNOSIS — N179 Acute kidney failure, unspecified: Secondary | ICD-10-CM | POA: Diagnosis not present

## 2018-07-25 DIAGNOSIS — J449 Chronic obstructive pulmonary disease, unspecified: Secondary | ICD-10-CM | POA: Diagnosis not present

## 2018-07-25 DIAGNOSIS — W19XXXA Unspecified fall, initial encounter: Secondary | ICD-10-CM

## 2018-07-25 DIAGNOSIS — S20212A Contusion of left front wall of thorax, initial encounter: Secondary | ICD-10-CM | POA: Diagnosis not present

## 2018-07-25 NOTE — Progress Notes (Signed)
Physical Therapy Treatment Patient Details Name: Traci [name] MRN: [id number] DOB: 1945-08-27 Today's Date: 07/25/2018    History of Present Illness Pt is a 72 y/o female who presents s/p fall at home 4 days prior to presentation at ED with worsening pain. X-ray negative for rib fractures. PMH significant for emphysema, CAD, HTN, frequent falls, COPD on PRN supplemental O2.    PT Comments    Pt reports improvement in pain today. She was able to demonstrate increased independence with functional mobility with RW use for support. Focus of session was stair training, and pt was able to negotiate 4 stairs with L railing use and mod assist through R HHA. Pt will need assist from husband for car transfer and stair negotiation at home. Pt was educated on positioning recommendations and reports she can sleep in the recliner at home for a short time if she is unable to be flat in her bed (unable to tolerate HOB flat and required heavy use of rails for bed mobility). Will continue to follow and progress as able per POC.   Follow Up Recommendations  Home health PT;Supervision for mobility/OOB     Equipment Recommendations  Rolling walker with 5" wheels    Recommendations for Other Services       Precautions / Restrictions Precautions Precautions: Fall Precaution Comments: Severe L flank pain consistent with ribs Restrictions Weight Bearing Restrictions: No    Mobility  Bed Mobility Overal bed mobility: Needs Assistance Bed Mobility: Rolling;Sidelying to Sit Rolling: Supervision Sidelying to sit: Min guard       General bed mobility comments: HOB slightly elevated (~15) and pt was able to perform log roll for transition to EOB with close guard and use of rails. Pt does have a recliner she can sleep in at home short term if needbe.   Transfers Overall transfer level: Needs assistance Equipment used: Rolling walker (2 wheeled) Transfers: Sit to/from Stand Sit to Stand: Min guard          General transfer comment: Close guard for power-up to full stand. Pt demonstrated proper hand placement on seated surface for safety.   Ambulation/Gait Ambulation/Gait assistance: Min assist Gait Distance (Feet): 50 Feet Assistive device: Rolling walker (2 wheeled) Gait Pattern/deviations: Shuffle;Trunk flexed Gait velocity: Decreased Gait velocity interpretation: 1.31 - 2.62 ft/sec, indicative of limited community ambulator General Gait Details: Slow, guarded. Pt was able to ambulate with reciprocal gait pattern this session and appeared more comfortable than previous session. Pt required cues for closer walker proximity and forward gaze.    Stairs Stairs: Yes Stairs assistance: Mod assist Stair Management: One rail Left;Step to pattern;Forwards Number of Stairs: 4 General stair comments: Unable to attempt without rail and HHA only, and unable to attempt backwards with RW due to pain. These were the 2 options to get pt in the house going up her 2 stairs in the front without railings. She was able to negotiate 4 stairs with railing use on L and mod HHA on the R, and will be able to enter home through back stairs with husband's assistance.    Wheelchair Mobility    Modified Rankin (Stroke Patients Only)       Balance Overall balance assessment: Needs assistance Sitting-balance support: Feet supported;No upper extremity supported Sitting balance-Leahy Scale: Poor Sitting balance - Comments: UE support required due to pain   Standing balance support: Bilateral upper extremity supported;During functional activity Standing balance-Leahy Scale: Poor Standing balance comment: Reliant on UE support for standing balance  Cognition Arousal/Alertness: Awake/alert Behavior During Therapy: WFL for tasks assessed/performed Overall Cognitive Status: Within Functional Limits for tasks assessed                                         Exercises      General Comments        Pertinent Vitals/Pain Pain Assessment: Faces Faces Pain Scale: Hurts even more Pain Location: L rib area from armpit down  Pain Descriptors / Indicators: Sharp Pain Intervention(s): Monitored during session    Home Living                      Prior Function            PT Goals (current goals can now be found in the care plan section) Acute Rehab PT Goals Patient Stated Goal: Decrease pain; be able to move without pain PT Goal Formulation: With patient Time For Goal Achievement: 07/31/18 Potential to Achieve Goals: Good Progress towards PT goals: Progressing toward goals    Frequency    Min 3X/week      PT Plan Current plan remains appropriate    Co-evaluation              AM-PAC PT "6 Clicks" Mobility   Outcome Measure                   End of Session Equipment Utilized During Treatment: Gait belt Activity Tolerance: Patient limited by pain Patient left: in bed;with call bell/phone within reach;with bed alarm set Nurse Communication: Mobility status PT Visit Diagnosis: Unsteadiness on feet (R26.81);Pain;Muscle weakness (generalized) (M62.81);Difficulty in walking, not elsewhere classified (R26.2) Pain - Right/Left: Left Pain - part of body: (Ribs)     Time: 3220-2542 PT Time Calculation (min) (ACUTE ONLY): 27 min  Charges:  $Gait Training: 23-37 mins                     Rolinda Roan, PT, DPT Acute Rehabilitation Services Pager: [phone] Office: [phone]    Thelma Comp 07/25/2018, 12:26 PM

## 2018-07-25 NOTE — Progress Notes (Signed)
  Date: 07/25/2018  Patient name: Traci [name]  Medical record number: [id number]  Date of birth: June 10, 1946   I have seen and evaluated this patient and I have discussed the plan of care with the house staff. Please see Dr. Cristal Generous note for complete details. I concur with her findings with the following additions/corrections:   Reviewed PT note.  Patient was much better at bedside today and easier to move around in the bed.  Will discharge today.   Sid Falcon, MD 07/25/2018, 1:37 PM

## 2018-07-25 NOTE — Progress Notes (Signed)
   Subjective: NAEON, chest wall pain is improving.   Objective:  Vital signs in last 24 hours: Vitals:   07/25/18 0538 07/25/18 0827 07/25/18 0828 07/25/18 0830  BP: (!) 117/44     Pulse: 82     Resp: 18     Temp: 97.9 F (36.6 C)     TempSrc: Oral     SpO2: 98% 97% 97% 97%  Weight:      Height:       Gen: sitting up in bed, NAD, eating breakfast  Cardiac: RRR, no m/r/g Pulm: CTAB MSK: TTP over left lateral chest wall   Assessment/Plan:  Active Problems:   AKI (acute kidney injury) ([place])   Fall   Abdominal pain   Contusion of left chest wall  72 year old female with COPD on PRN supplemental oxygen, CAD, hypertension, depression, frequent falls who presented with 4 days of worsening left chest wall pain that began after falling in the bathroom.  Denies syncope or loss of consciousness.    1. Chest wall pain: Mildly improved with voltaren gel and percocet. Working with PT - will d/c home after PT session today - HH PT is set up  Dispo: Anticipated discharge today pending PT eval   Isabelle Course, MD 07/25/2018, 11:55 AM Pager: [phone]

## 2018-08-04 ENCOUNTER — Ambulatory Visit (INDEPENDENT_AMBULATORY_CARE_PROVIDER_SITE_OTHER): Payer: Medicare Other | Admitting: Internal Medicine

## 2018-08-04 ENCOUNTER — Encounter: Payer: Self-pay | Admitting: Internal Medicine

## 2018-08-04 VITALS — BP 96/55 | HR 96 | Ht 69.0 in | Wt 161.6 lb

## 2018-08-04 DIAGNOSIS — I351 Nonrheumatic aortic (valve) insufficiency: Secondary | ICD-10-CM | POA: Diagnosis not present

## 2018-08-04 DIAGNOSIS — Z66 Do not resuscitate: Secondary | ICD-10-CM | POA: Diagnosis not present

## 2018-08-04 DIAGNOSIS — I10 Essential (primary) hypertension: Secondary | ICD-10-CM

## 2018-08-04 DIAGNOSIS — I5033 Acute on chronic diastolic (congestive) heart failure: Secondary | ICD-10-CM

## 2018-08-04 NOTE — Progress Notes (Signed)
OFFICE NOTE  Chief Complaint:  Follow-up aortic valve disease  Primary Care Physician: Raelyn Number, MD  HPI:  Traci [name] is a 72 y.o. female former patient of Dr. Mare Ferrari (last seen in 2013) who is scheduled to see me for the first time today. She was recently seen in the hospital by my partner Dr. Marlou Porch. Her last heart catheterization was in June 2013 which she underwent coronary angiography and had nonobstructive coronary disease with normal LV function. She also has moderate to severe COPD, hypertension, anxiety and depression. She was admitted just after Christmas with progressive exertional dyspnea and right-sided chest discomfort. There is associated nonproductive cough. Findings were consistent with COPD/diastolic CHF exacerbation. Echo was performed which is a technically difficult study showing grade 2 diastolic dysfunction at least moderate aortic insufficiency and moderate mitral valve stenosis, possibly rheumatic. She then underwent TEE on 08/22/2016, which demonstrated EF of 60-65%, moderate to severe aortic insufficiency and a rheumatic mitral valve with diastolic leaflet doming and moderate stenosis with moderate to severe regurgitation. Calculated valve area was 1.14 cm. RVSP is 39 mmHg. She was diuresed and on discharge weight was around 160 pounds. She reports her weight at home is about 152 pounds and her weight today in the office was 159 pounds. She denies any history of lower extremity edema or abdominal swelling although occasionally feels some bloating. She has been on oxygen since discharge she says helps her the most but her breathing has not improved back to anywhere near her baseline.  02/13/2017  Traci [name] returns today for follow-up. She reports worsening shortness of breath. She's seeing Dr. Collene Mares him for COPD which is considered to be severe. She was found to have dramatic two-vessel valvular disease with moderate to severe AI and at least moderate  rheumatic mitral valve stenosis. EF was normal 60-65%. Her weight continues to decline. She reports decreased appetite. There is no evidence for worsening swelling and she remains on a diuretic. I reviewed her images and feel that is unlikely with her significant lung disease that she is a candidate for valve replacement surgery. Based on this the options are quite limited for her. I suspect that ultimately she will develop progressive worsening valvular heart disease leading heart failure and will either be admitted or this could be fatal. We did talk about some end-of-life issues today. Namely whether or not she wanted to be DO NOT RESUSCITATE. She did indicate that she's proves to discuss this with her family would not want to be resuscitated. Based on that we completed a yellow MOLST form today in the office which she was provided and I recommended she place a prominently in her residence.  08/19/2017  Traci [name] was seen today in follow-up.  She reports fairly stable COPD and heart failure symptoms.  As previously mentioned she has moderate to severe AI and moderate MS/MR and is not considered a valve surgery candidate based on her severe COPD.  Medical therapy is recommended.  We seem to be maintaining euvolemic status on her Lasix.  Her weight is actually down a few pounds.  Appetite is not great.  She did follow-up with her pulmonologist who made no changes to her medications.  02/04/2018  Traci [name] returns today for follow-up.  She says her COPD has been fairly stable although had an increase in her inhalers in January by her pulmonologist.  Recently she has had about 5 pound weight gain and does feel like she might be carrying  more fluid.  A repeat echo was just performed which demonstrates normal systolic function however she has severe aortic insufficiency and moderate mitral insufficiency with some mitral stenosis and heavy calcification of the valve leaflets.  Both valves appear to be  rheumatic.  In the past she has not been considered a candidate for surgery or traditional SAVR replacement due to her severe COPD.  It is questionable whether she could be a candidate for TAVR for her severe AI.  I do not think she will be a candidate for mitral valve surgery and given heavy calcification and stenosis, not a candidate for mitral clip.  In addition her regurgitation is not as severe.  I do feel that she is symptomatic related to severe AI, but the question is whether she would tolerate general anesthesia and mechanical ventilation given the severe COPD for the procedure.  In addition she is been having problems with weakness which appear to be due to a high cervical cord compression.  She saw Dr. Saintclair Halsted for this and he is considering surgery.  From my standpoint, she would be at high risk for surgery given her pulmonary and cardiac comorbidities.  If we could successfully replace her aortic valve, however she may be at acceptable cardiac risk for surgery at that point.  08/04/2018  Traci [name] is seen today for follow-up of her aortic valve disease.  She was referred to and saw Dr. Cyndia Bent with the multidisciplinary valve team.  He felt that she was at prohibitive risk for surgery given her severe COPD and was not a candidate for TAVR.  Based on this we will have to continue to manage her aortic insufficiency medically.  Currently she denies any shortness of breath and because she is quite immobile, she is not that symptomatic.  PMHx:  Past Medical History:  Diagnosis Date  . Anginal pain ([place])   . Anxiety   . Arthritis   . Chronic bronchitis ([place])   . Chronic lower back pain   . COPD (chronic obstructive pulmonary disease) ([place])   . Depression   . Dyspepsia   . GERD (gastroesophageal reflux disease)   . H/O hiatal hernia   . ELFYBOFB(510.2)    "1-2/week" (08/16/2016)  . History of blood transfusion 1963   w/childbirth  . Hyperlipidemia   . Hypertension   . Hypothyroidism   .  Shortness of breath 02/12/12   "all the time"  . Tremor     Past Surgical History:  Procedure Laterality Date  . BACK SURGERY    . CARDIAC CATHETERIZATION  04/19/1995   EF 65-70%  . CATARACT EXTRACTION W/ INTRAOCULAR LENS  IMPLANT, BILATERAL  2012  . FRACTURE SURGERY    . LAPAROSCOPIC CHOLECYSTECTOMY  2010  . LEFT HEART CATHETERIZATION WITH CORONARY ANGIOGRAM N/A 02/13/2012   Procedure: LEFT HEART CATHETERIZATION WITH CORONARY ANGIOGRAM;  Surgeon: Thayer Headings, MD;  Location: [facility] CATH LAB;  Service: Cardiovascular;  Laterality: N/A;  . LUMBAR DISC SURGERY  early 2000's  . TEE WITHOUT CARDIOVERSION N/A 08/22/2016   Procedure: TRANSESOPHAGEAL ECHOCARDIOGRAM (TEE);  Surgeon: Sanda Klein, MD;  Location: [facility];  Service: Cardiovascular;  Laterality: N/A;  . TIBIA IM NAIL INSERTION Right 03/30/2014   Procedure: INTRAMEDULLARY (IM) NAIL TIBIAL;  Surgeon: Wylene Simmer, MD;  Location: [facility];  Service: Orthopedics;  Laterality: Right;  . US ECHOCARDIOGRAPHY  12/17/2007   EF 55-60%  . VAGINAL HYSTERECTOMY  1980's    FAMHx:  Family History  Problem Relation Age of Onset  .  Heart attack Father   . Obesity Sister   . Emphysema Sister   . Heart disease Brother     SOCHx:   reports that she quit smoking about 1 years ago. Her smoking use included cigarettes. She has a 5.50 pack-year smoking history. She quit smokeless tobacco use about 1 years ago. She reports that she does not drink alcohol or use drugs.  ALLERGIES:  Allergies  Allergen Reactions  . Cephalexin Hives  . Cymbalta [Duloxetine Hcl] Other (See Comments)    Sees spiders  . Latex     " MAKES ME ITCHY "     ROS: Pertinent items noted in HPI and remainder of comprehensive ROS otherwise negative.  HOME MEDS: Current Outpatient Medications on File Prior to Visit  Medication Sig Dispense Refill  . albuterol (PROVENTIL HFA;VENTOLIN HFA) 108 (90 Base) MCG/ACT inhaler Inhale 2 puffs into the lungs every 6 (six) hours as  needed for wheezing or shortness of breath. 1 Inhaler 2  . ARIPiprazole (ABILIFY) 2 MG tablet Take 1 tablet by mouth daily.    Marland Kitchen buPROPion (WELLBUTRIN XL) 150 MG 24 hr tablet Take 450 mg by mouth daily.     . Cholecalciferol (VITAMIN D3) 2000 units TABS Take 2,000 Units by mouth daily.    . Fluticasone-Umeclidin-Vilant (TRELEGY ELLIPTA) 100-62.5-25 MCG/INH AEPB Inhale 1 puff into the lungs daily. 60 each 6  . folic acid (FOLVITE) 1 MG tablet Take 1 tablet (1 mg total) by mouth daily. 30 tablet 0  . levothyroxine (SYNTHROID, LEVOTHROID) 25 MCG tablet Take 25 mcg by mouth daily before breakfast.     . omeprazole (PRILOSEC) 20 MG capsule Take 20 mg by mouth daily.    Marland Kitchen oxyCODONE (ROXICODONE) 15 MG immediate release tablet Take 15 mg by mouth every 6 (six) hours as needed for pain.    . OXYGEN Inhale 2 L into the lungs as needed.    . potassium chloride SA (K-DUR,KLOR-CON) 20 MEQ tablet Take 1 tablet (20 mEq total) by mouth daily. 30 tablet 0  . SYMBICORT 160-4.5 MCG/ACT inhaler TAKE 2 PUFFS BY MOUTH TWICE A DAY (Patient taking differently: Inhale 2 puffs into the lungs 2 (two) times daily. ) 10.2 Inhaler 2  . traZODone (DESYREL) 150 MG tablet Take 300 mg by mouth at bedtime as needed for sleep.     . vitamin B-12 (CYANOCOBALAMIN) 1000 MCG tablet Take 1,000 mcg by mouth daily.     No current facility-administered medications on file prior to visit.     LABS/IMAGING: No results found for this or any previous visit (from the past 48 hour(s)). No results found.  WEIGHTS: Wt Readings from Last 3 Encounters:  08/04/18 161 lb 9.6 oz (73.3 kg)  07/23/18 168 lb (76.2 kg)  04/25/18 159 lb (72.1 kg)    VITALS: BP (!) 96/55   Pulse 96   Ht 5\' 9"  (1.753 m)   Wt 161 lb 9.6 oz (73.3 kg)   BMI 23.86 kg/m   EXAM: General appearance: alert, no distress and in wheelchair Neck: no carotid bruit, no JVD and thyroid not enlarged, symmetric, no tenderness/mass/nodules Lungs: diminished breath sounds  bilaterally Heart: regular rate and rhythm Abdomen: soft, non-tender; bowel sounds normal; no masses,  no organomegaly Extremities: extremities normal, atraumatic, no cyanosis or edema Pulses: 2+ and symmetric Skin: Skin color, texture, turgor normal. No rashes or lesions Neurologic: Alert and oriented X 3, normal strength and tone. Normal symmetric reflexes. Normal coordination and gait Psych: Pleasant  EKG: Normal  sinus rhythm 96, nonspecific ST changes-personally reviewed  ASSESSMENT: 1. 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 Moderate mitral stenosis with thickening and cord involvement 3. Severe COPD on continuous oxygen 4. Acute on chronic diastolic congestive heart failure- NYHA class III symptoms 5. Cervical stenosis with weakness 6. DNR  PLAN: 1.   Mrs. Dahmer is not a candidate for TAVR and was turned down for traditional surgical aortic valve replacement due to her severe COPD.  She does have some chronic diastolic congestive heart failure, but has been off of a diuretic.  Recently she was hospitalized for a fall which is been a problem.  She definitely has gait instability.  Unfortunately there are no surgical management options of her valve disease.  She does appear euvolemic on exam today.  We will continue her current medications.  Follow-up annually or sooner as necessary.  Pixie Casino, MD, [facility], [place] Director of the Advanced Lipid Disorders &  Cardiovascular Risk Reduction [facility] Attending Cardiologist  Direct Dial: [phone]  Fax: [phone]  Website:  [email or web address] 08/04/2018, 3:04 PM

## 2018-08-04 NOTE — Patient Instructions (Signed)
Medication Instructions:  Continue current medications If you need a refill on your cardiac medications before your next appointment, please call your pharmacy.   Follow-Up: At [facility], you and your health needs are our priority.  As part of our continuing mission to provide you with exceptional heart care, we have created designated Provider Care Teams.  These Care Teams include your primary Cardiologist (physician) and Advanced Practice Providers (APPs -  Physician Assistants and Nurse Practitioners) who all work together to provide you with the care you need, when you need it. You will need a follow up appointment in 12 months.  Please call our office 2 months in advance to schedule this appointment.  You may see Kenneth C Hilty, MD or one of the following Advanced Practice Providers on your designated Care Team: Hao Meng, PA-C . Angela Duke, PA-C  Any Other Special Instructions Will Be Listed Below (If Applicable).    

## 2018-08-05 ENCOUNTER — Encounter: Payer: Self-pay | Admitting: Internal Medicine

## 2018-10-08 ENCOUNTER — Ambulatory Visit (INDEPENDENT_AMBULATORY_CARE_PROVIDER_SITE_OTHER)
Admission: RE | Admit: 2018-10-08 | Discharge: 2018-10-08 | Disposition: A | Discharge status: Home / Self Care | Payer: Medicare Other | Source: Ambulatory Visit | Attending: Acute Care | Admitting: Acute Care

## 2018-10-08 DIAGNOSIS — Z87891 Personal history of nicotine dependence: Secondary | ICD-10-CM | POA: Diagnosis not present

## 2018-10-08 DIAGNOSIS — Z122 Encounter for screening for malignant neoplasm of respiratory organs: Secondary | ICD-10-CM

## 2018-10-20 ENCOUNTER — Other Ambulatory Visit: Payer: Self-pay | Admitting: Acute Care

## 2018-10-20 DIAGNOSIS — Z122 Encounter for screening for malignant neoplasm of respiratory organs: Secondary | ICD-10-CM

## 2018-10-20 DIAGNOSIS — Z87891 Personal history of nicotine dependence: Secondary | ICD-10-CM

## 2018-10-28 ENCOUNTER — Encounter (HOSPITAL_COMMUNITY): Payer: Self-pay | Admitting: Emergency Medicine

## 2018-10-28 ENCOUNTER — Emergency Department (HOSPITAL_COMMUNITY): Payer: Medicare Other

## 2018-10-28 ENCOUNTER — Other Ambulatory Visit: Payer: Self-pay

## 2018-10-28 ENCOUNTER — Emergency Department (HOSPITAL_COMMUNITY)
Admission: EM | Admit: 2018-10-28 | Discharge: 2018-10-28 | Disposition: A | Discharge status: Home / Self Care | Payer: Medicare Other | Attending: Emergency Medicine | Admitting: Emergency Medicine

## 2018-10-28 DIAGNOSIS — I252 Old myocardial infarction: Secondary | ICD-10-CM | POA: Insufficient documentation

## 2018-10-28 DIAGNOSIS — I5032 Chronic diastolic (congestive) heart failure: Secondary | ICD-10-CM | POA: Insufficient documentation

## 2018-10-28 DIAGNOSIS — E039 Hypothyroidism, unspecified: Secondary | ICD-10-CM | POA: Diagnosis not present

## 2018-10-28 DIAGNOSIS — E785 Hyperlipidemia, unspecified: Secondary | ICD-10-CM | POA: Insufficient documentation

## 2018-10-28 DIAGNOSIS — Z87891 Personal history of nicotine dependence: Secondary | ICD-10-CM | POA: Diagnosis not present

## 2018-10-28 DIAGNOSIS — J449 Chronic obstructive pulmonary disease, unspecified: Secondary | ICD-10-CM | POA: Insufficient documentation

## 2018-10-28 DIAGNOSIS — Z91040 Latex allergy status: Secondary | ICD-10-CM | POA: Diagnosis not present

## 2018-10-28 DIAGNOSIS — M25551 Pain in right hip: Secondary | ICD-10-CM | POA: Diagnosis not present

## 2018-10-28 DIAGNOSIS — I110 Hypertensive heart disease with heart failure: Secondary | ICD-10-CM | POA: Diagnosis not present

## 2018-10-28 DIAGNOSIS — Z79899 Other long term (current) drug therapy: Secondary | ICD-10-CM | POA: Insufficient documentation

## 2018-10-28 MED ORDER — OXYCODONE HCL 5 MG PO TABS
5.0000 mg | ORAL_TABLET | Freq: Once | ORAL | Status: AC
  Administered 2018-10-28: 5 mg via ORAL
  Filled 2018-10-28: qty 1

## 2018-10-28 NOTE — ED Notes (Signed)
Family at bedside. 

## 2018-10-28 NOTE — ED Notes (Signed)
RN had pt confirm with family they will come up to get her.

## 2018-10-28 NOTE — ED Notes (Signed)
RN confirmed with daughter she is on her way.

## 2018-10-28 NOTE — ED Notes (Signed)
[facility] called  

## 2018-10-28 NOTE — ED Triage Notes (Signed)
[facility]- pt from home. Pt fell yesterday and complains of right hip and back pain. Pt is lethargic. Pt uses a walker at home. Denies hitting her head. Pt does not take blood thinners.

## 2018-10-28 NOTE — ED Provider Notes (Signed)
[facility] EMERGENCY DEPARTMENT Provider Note   CSN: [id number] Arrival date & time: 10/28/18  1940    History   Chief Complaint Chief Complaint  Patient presents with  . Hip Pain    HPI Traci [name] is a 73 y.o. female.     The history is provided by the patient and the EMS personnel.  Hip Pain  This is a new problem. The current episode started yesterday. The problem occurs constantly. The problem has not changed since onset.Pertinent negatives include no chest pain, no abdominal pain, no headaches and no shortness of breath. The symptoms are aggravated by walking. Relieved by: narcotics at home. She has tried acetaminophen (narcotics) for the symptoms. The treatment provided mild relief.    Past Medical History:  Diagnosis Date  . Anginal pain ([place])   . Anxiety   . Arthritis   . Chronic bronchitis ([place])   . Chronic lower back pain   . COPD (chronic obstructive pulmonary disease) ([place])   . Depression   . Dyspepsia   . GERD (gastroesophageal reflux disease)   . H/O hiatal hernia   . DQQIWLNL(892.1)    "1-2/week" (08/16/2016)  . History of blood transfusion 1963   w/childbirth  . Hyperlipidemia   . Hypertension   . Hypothyroidism   . Shortness of breath 02/12/12   "all the time"  . Tremor     Patient Active Problem List   Diagnosis Date Noted  . AKI (acute kidney injury) ([place]) 07/23/2018  . Fall 07/23/2018  . Abdominal pain   . Contusion of left chest wall   . Severe aortic insufficiency 02/04/2018  . Tremor   . Hypertension   . Hyperlipidemia   . H/O hiatal hernia   . GERD (gastroesophageal reflux disease)   . Dyspepsia   . Depression   . COPD (chronic obstructive pulmonary disease) ([place])   . Chronic lower back pain   . Chronic bronchitis ([place])   . Arthritis   . Anxiety   . Anginal pain ([place])   . DNR (do not resuscitate) 02/13/2017  . Shortness of breath 09/14/2016  . 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  Acute on chronic diastolic CHF (congestive heart failure), NYHA class 3 ([place])   . CHF (congestive heart failure) ([place]) 08/16/2016  . Anemia 08/16/2016  . Chronic back pain 08/16/2016  . Hypothyroidism 08/16/2016  . Benign essential tremor 08/16/2016  . Acute respiratory failure with hypoxia ([place]) 04/01/2014  . Sinus tachycardia 03/31/2014  . Fractured tibia and fibula 03/26/2014  . Right tibial fracture 03/26/2014  . Closed right tibial fracture 03/26/2014  . Unstable gait 03/26/2014  . Chest pain syndrome 02/12/2012  . Spinal stenosis 09/24/2011  . HEARTBURN 08/03/2010  . BENIGN NEOPLASM OF ADRENAL GLAND 07/28/2010  . Anxiety disorder 07/28/2010  . MYOCARDIAL INFARCTION, HX OF 07/28/2010  . Chronic obstructive pulmonary disease ([place]) 07/28/2010  . DIVERTICULOSIS, COLON 07/28/2010  . COLONIC POLYPS, ADENOMATOUS, HX OF 07/28/2010  . HLD (hyperlipidemia) 06/23/2010  . HTN (hypertension) 06/23/2010  . ACUTE DILATATION OF STOMACH 06/23/2010  . ABDOMINAL PAIN, GENERALIZED 06/23/2010  . History of blood transfusion 08/20/1961    Past Surgical History:  Procedure Laterality Date  . BACK SURGERY    . CARDIAC CATHETERIZATION  04/19/1995   EF 65-70%  . CATARACT EXTRACTION W/ INTRAOCULAR LENS  IMPLANT, BILATERAL  2012  . FRACTURE SURGERY    . LAPAROSCOPIC CHOLECYSTECTOMY  2010  . LEFT HEART CATHETERIZATION WITH  CORONARY ANGIOGRAM N/A 02/13/2012   Procedure: LEFT HEART CATHETERIZATION WITH CORONARY ANGIOGRAM;  Surgeon: Thayer Headings, MD;  Location: [facility] CATH LAB;  Service: Cardiovascular;  Laterality: N/A;  . LUMBAR DISC SURGERY  early 2000's  . TEE WITHOUT CARDIOVERSION N/A 08/22/2016   Procedure: TRANSESOPHAGEAL ECHOCARDIOGRAM (TEE);  Surgeon: Sanda Klein, MD;  Location: [facility];  Service: Cardiovascular;  Laterality: N/A;  . TIBIA IM NAIL INSERTION Right 03/30/2014   Procedure: INTRAMEDULLARY (IM) NAIL TIBIAL;  Surgeon: Wylene Simmer, MD;  Location: [facility];  Service: Orthopedics;   Laterality: Right;  . US ECHOCARDIOGRAPHY  12/17/2007   EF 55-60%  . VAGINAL HYSTERECTOMY  1980's     OB History   No obstetric history on file.      Home Medications    Prior to Admission medications   Medication Sig Start Date End Date Taking? Authorizing Provider  albuterol (PROVENTIL HFA;VENTOLIN HFA) 108 (90 Base) MCG/ACT inhaler Inhale 2 puffs into the lungs every 6 (six) hours as needed for wheezing or shortness of breath. 01/22/17   Mannam, Hart Robinsons, MD  ARIPiprazole (ABILIFY) 2 MG tablet Take 1 tablet by mouth daily. 01/10/18   [provider]  buPROPion (WELLBUTRIN XL) 150 MG 24 hr tablet Take 450 mg by mouth daily.     [provider]  Cholecalciferol (VITAMIN D3) 2000 units TABS Take 2,000 Units by mouth daily.    [provider]  Fluticasone-Umeclidin-Vilant (TRELEGY ELLIPTA) 100-62.5-25 MCG/INH AEPB Inhale 1 puff into the lungs daily. 09/13/17   Mannam, Hart Robinsons, MD  folic acid (FOLVITE) 1 MG tablet Take 1 tablet (1 mg total) by mouth daily. 08/26/16   Elgergawy, Silver Huguenin, MD  levothyroxine (SYNTHROID, LEVOTHROID) 25 MCG tablet Take 25 mcg by mouth daily before breakfast.  08/31/14   [provider]  omeprazole (PRILOSEC) 20 MG capsule Take 20 mg by mouth daily.    [provider]  oxyCODONE (ROXICODONE) 15 MG immediate release tablet Take 15 mg by mouth every 6 (six) hours as needed for pain.    [provider]  OXYGEN Inhale 2 L into the lungs as needed.    [provider]  potassium chloride SA (K-DUR,KLOR-CON) 20 MEQ tablet Take 1 tablet (20 mEq total) by mouth daily. 08/26/16   Elgergawy, Silver Huguenin, MD  SYMBICORT 160-4.5 MCG/ACT inhaler TAKE 2 PUFFS BY MOUTH TWICE A DAY Patient taking differently: Inhale 2 puffs into the lungs 2 (two) times daily.  02/06/18   Mannam, Hart Robinsons, MD  traZODone (DESYREL) 150 MG tablet Take 300 mg by mouth at bedtime as needed for sleep.  08/31/14   [provider]  vitamin B-12  (CYANOCOBALAMIN) 1000 MCG tablet Take 1,000 mcg by mouth daily.    [provider]    Family History Family History  Problem Relation Age of Onset  . Heart attack Father   . Obesity Sister   . Emphysema Sister   . Heart disease Brother     Social History Social History   Tobacco Use  . Smoking status: Former Smoker    Packs/day: 0.10    Years: 55.00    Pack years: 5.50    Types: Cigarettes    Last attempt to quit: 08/19/2016    Years since quitting: 2.1  . Smokeless tobacco: Former Systems developer    Quit date: 08/13/2016  . Tobacco comment: Encouraged to remain smoke free  Substance Use Topics  . Alcohol use: No    Alcohol/week: 0.0 standard drinks  . Drug use:  No     Allergies   Cephalexin; Cymbalta [duloxetine hcl]; and Latex   Review of Systems Review of Systems  Constitutional: Negative for chills and fever.  HENT: Negative for ear pain and sore throat.   Eyes: Negative for pain and visual disturbance.  Respiratory: Negative for cough and shortness of breath.   Cardiovascular: Negative for chest pain and palpitations.  Gastrointestinal: Negative for abdominal pain and vomiting.  Genitourinary: Negative for dysuria and hematuria.  Musculoskeletal: Positive for arthralgias, back pain and gait problem.  Skin: Negative for color change and rash.  Neurological: Negative for seizures, syncope and headaches.  All other systems reviewed and are negative.    Physical Exam Updated Vital Signs  ED Triage Vitals  Enc Vitals Group     BP 10/28/18 1947 (!) 113/42     Pulse Rate 10/28/18 1947 (!) 106     Resp 10/28/18 1947 (!) 24     Temp 10/28/18 1945 98.9 F (37.2 C)     Temp Source 10/28/18 1945 Oral     SpO2 10/28/18 1947 94 %     Weight 10/28/18 1947 168 lb (76.2 kg)     Height 10/28/18 1947 5\' 9"  (1.753 m)     Head Circumference --      Peak Flow --      Pain Score 10/28/18 1945 10     Pain Loc --      Pain Edu? --      Excl. in [place]? --     Physical  Exam Vitals signs and nursing note reviewed.  Constitutional:      General: She is not in acute distress.    Appearance: She is well-developed.  HENT:     Head: Normocephalic and atraumatic.     Nose: Nose normal.  Eyes:     Extraocular Movements: Extraocular movements intact.     Conjunctiva/sclera: Conjunctivae normal.     Pupils: Pupils are equal, round, and reactive to light.  Neck:     Musculoskeletal: Normal range of motion and neck supple.  Cardiovascular:     Rate and Rhythm: Normal rate and regular rhythm.     Pulses: Normal pulses.     Heart sounds: Normal heart sounds. No murmur.  Pulmonary:     Effort: Pulmonary effort is normal. No respiratory distress.     Breath sounds: Normal breath sounds.  Abdominal:     General: There is no distension.     Palpations: Abdomen is soft.     Tenderness: There is no abdominal tenderness.  Musculoskeletal: Normal range of motion.        General: Swelling (left ankle swelling and bruising ) and tenderness (TTP to paraspinal lumbar muscles on the right) present.     Right lower leg: No edema.     Left lower leg: No edema.  Skin:    General: Skin is warm and dry.     Capillary Refill: Capillary refill takes less than 2 seconds.  Neurological:     General: No focal deficit present.     Mental Status: She is alert.     Cranial Nerves: No cranial nerve deficit.     Sensory: No sensory deficit.     Motor: No weakness.     Coordination: Coordination normal.  Psychiatric:        Mood and Affect: Mood normal.      ED Treatments / Results  Labs (all labs ordered are listed, but only abnormal results are displayed)  Labs Reviewed - No data to display  EKG None  Radiology Dg Lumbar Spine Complete  Result Date: 10/28/2018 CLINICAL DATA:  Pain, fall EXAM: LUMBAR SPINE - COMPLETE 4+ VIEW COMPARISON:  CT 11/03/2016 FINDINGS: Moderate compression fracture L2, chronic but with slight increased loss of height since comparison CT from  2018. Remaining vertebral bodies demonstrate normal stature. Moderate degenerative change at L5-S1. Aortic atherosclerosis. IMPRESSION: 1. Moderate chronic compression fracture L2, with slight interval increase in loss of height of the vertebral body since 2018 comparison CT 2. Otherwise no acute osseous abnormality is seen Electronically Signed   By: Donavan Foil M.D.   On: 10/28/2018 21:16   Dg Ankle Complete Left  Result Date: 10/28/2018 CLINICAL DATA:  Pain after fall EXAM: LEFT ANKLE COMPLETE - 3+ VIEW COMPARISON:  None. FINDINGS: Bones appear osteopenic. No acute displaced fracture or malalignment. Ankle mortise is symmetric. IMPRESSION: Osteopenia.  No definite acute osseous abnormality Electronically Signed   By: Donavan Foil M.D.   On: 10/28/2018 21:17   Dg Hip Unilat With Pelvis 2-3 Views Right  Result Date: 10/28/2018 CLINICAL DATA:  Pain after fall EXAM: DG HIP (WITH OR WITHOUT PELVIS) 2-3V RIGHT COMPARISON:  None. FINDINGS: SI joints are non widened. Pubic symphysis and rami are intact. No acute displaced fracture or malalignment. IMPRESSION: No acute osseous abnormality. Electronically Signed   By: Donavan Foil M.D.   On: 10/28/2018 21:13    Procedures Procedures (including critical care time)  Medications Ordered in ED Medications  oxyCODONE (Oxy IR/ROXICODONE) immediate release tablet 5 mg (has no administration in time range)     Initial Impression / Assessment and Plan / ED Course  I have reviewed the triage vital signs and the nursing notes.  Pertinent labs & imaging results that were available during my care of the patient were reviewed by me and considered in my medical decision making (see chart for details).     Traci [name] is a 73 year old female with history of hypertension, anxiety, chronic back pain who presents to the ED with right hip pain after fall yesterday.  Patient has been able to ambulate with her walker but has ongoing pain despite using narcotic  pain medicine at home.  Patient has some tenderness around the right hip but has most of her pain in the right paraspinal muscles of her low back.  Patient has good range of motion of the hip.  She is able to stand without much pain in the room.  She has no midline spinal tenderness on exam.  Patient had mechanical fall.  She does not have any chest pain, shortness of breath.  Did not hit her head did not lose consciousness.  X-rays did not show any acute hip fracture or malalignment.  She does have chronic L2 compression fracture however she does not have any midline spinal pain.  X-ray does show advancement of vertebral body compression but this is likely chronic in nature.  She was given additional dose of Roxicodone in the ED with some relief.  Recommend continued home pain regimen.  Discharged from ED in good condition.  This chart was dictated using voice recognition software.  Despite best efforts to proofread,  errors can occur which can change the documentation meaning.    Final Clinical Impressions(s) / ED Diagnoses   Final diagnoses:  Right hip pain    ED Discharge Orders    None       Lennice Sites, DO 10/28/18 2129

## 2018-10-28 NOTE — ED Notes (Signed)
Patient verbalizes understanding of discharge instructions. Opportunity for questioning and answers were provided. Armband removed by staff, pt discharged from ED. Pt to be transported home by [facility]. Pt on 2L always for emphysema.

## 2018-11-03 ENCOUNTER — Encounter (HOSPITAL_COMMUNITY): Payer: Self-pay

## 2018-11-03 ENCOUNTER — Inpatient Hospital Stay (HOSPITAL_COMMUNITY)
Admission: EM | Admit: 2018-11-03 | Discharge: 2018-11-06 | DRG: 552 | Disposition: A | Discharge status: Skilled Nursing Facility | Payer: Medicare Other | Attending: Family Medicine | Admitting: Family Medicine

## 2018-11-03 ENCOUNTER — Emergency Department (HOSPITAL_COMMUNITY): Payer: Medicare Other

## 2018-11-03 DIAGNOSIS — S72009A Fracture of unspecified part of neck of unspecified femur, initial encounter for closed fracture: Secondary | ICD-10-CM | POA: Diagnosis present

## 2018-11-03 DIAGNOSIS — F411 Generalized anxiety disorder: Secondary | ICD-10-CM | POA: Diagnosis present

## 2018-11-03 DIAGNOSIS — Z8349 Family history of other endocrine, nutritional and metabolic diseases: Secondary | ICD-10-CM

## 2018-11-03 DIAGNOSIS — Z8249 Family history of ischemic heart disease and other diseases of the circulatory system: Secondary | ICD-10-CM

## 2018-11-03 DIAGNOSIS — Z66 Do not resuscitate: Secondary | ICD-10-CM | POA: Diagnosis present

## 2018-11-03 DIAGNOSIS — Z751 Person awaiting admission to adequate facility elsewhere: Secondary | ICD-10-CM | POA: Diagnosis not present

## 2018-11-03 DIAGNOSIS — Z91040 Latex allergy status: Secondary | ICD-10-CM

## 2018-11-03 DIAGNOSIS — M545 Low back pain: Secondary | ICD-10-CM | POA: Diagnosis present

## 2018-11-03 DIAGNOSIS — M4800 Spinal stenosis, site unspecified: Secondary | ICD-10-CM | POA: Diagnosis present

## 2018-11-03 DIAGNOSIS — Z79891 Long term (current) use of opiate analgesic: Secondary | ICD-10-CM

## 2018-11-03 DIAGNOSIS — J449 Chronic obstructive pulmonary disease, unspecified: Secondary | ICD-10-CM | POA: Diagnosis present

## 2018-11-03 DIAGNOSIS — I080 Rheumatic disorders of both mitral and aortic valves: Secondary | ICD-10-CM | POA: Diagnosis present

## 2018-11-03 DIAGNOSIS — Z7401 Bed confinement status: Secondary | ICD-10-CM | POA: Diagnosis not present

## 2018-11-03 DIAGNOSIS — K219 Gastro-esophageal reflux disease without esophagitis: Secondary | ICD-10-CM | POA: Diagnosis present

## 2018-11-03 DIAGNOSIS — R339 Retention of urine, unspecified: Secondary | ICD-10-CM | POA: Diagnosis not present

## 2018-11-03 DIAGNOSIS — I5032 Chronic diastolic (congestive) heart failure: Secondary | ICD-10-CM | POA: Diagnosis present

## 2018-11-03 DIAGNOSIS — I110 Hypertensive heart disease with heart failure: Secondary | ICD-10-CM | POA: Diagnosis present

## 2018-11-03 DIAGNOSIS — G250 Essential tremor: Secondary | ICD-10-CM | POA: Diagnosis present

## 2018-11-03 DIAGNOSIS — M25551 Pain in right hip: Secondary | ICD-10-CM

## 2018-11-03 DIAGNOSIS — Z9981 Dependence on supplemental oxygen: Secondary | ICD-10-CM

## 2018-11-03 DIAGNOSIS — Z825 Family history of asthma and other chronic lower respiratory diseases: Secondary | ICD-10-CM

## 2018-11-03 DIAGNOSIS — W1830XA Fall on same level, unspecified, initial encounter: Secondary | ICD-10-CM | POA: Diagnosis present

## 2018-11-03 DIAGNOSIS — J9610 Chronic respiratory failure, unspecified whether with hypoxia or hypercapnia: Secondary | ICD-10-CM | POA: Diagnosis present

## 2018-11-03 DIAGNOSIS — M1990 Unspecified osteoarthritis, unspecified site: Secondary | ICD-10-CM | POA: Diagnosis present

## 2018-11-03 DIAGNOSIS — Z79899 Other long term (current) drug therapy: Secondary | ICD-10-CM

## 2018-11-03 DIAGNOSIS — F329 Major depressive disorder, single episode, unspecified: Secondary | ICD-10-CM | POA: Diagnosis present

## 2018-11-03 DIAGNOSIS — Z881 Allergy status to other antibiotic agents status: Secondary | ICD-10-CM

## 2018-11-03 DIAGNOSIS — Z888 Allergy status to other drugs, medicaments and biological substances status: Secondary | ICD-10-CM

## 2018-11-03 DIAGNOSIS — I252 Old myocardial infarction: Secondary | ICD-10-CM | POA: Diagnosis not present

## 2018-11-03 DIAGNOSIS — Z87891 Personal history of nicotine dependence: Secondary | ICD-10-CM

## 2018-11-03 DIAGNOSIS — S3210XA Unspecified fracture of sacrum, initial encounter for closed fracture: Secondary | ICD-10-CM | POA: Diagnosis not present

## 2018-11-03 DIAGNOSIS — S32119A Unspecified Zone I fracture of sacrum, initial encounter for closed fracture: Secondary | ICD-10-CM | POA: Diagnosis present

## 2018-11-03 DIAGNOSIS — G8929 Other chronic pain: Secondary | ICD-10-CM | POA: Diagnosis present

## 2018-11-03 DIAGNOSIS — M25552 Pain in left hip: Secondary | ICD-10-CM | POA: Diagnosis present

## 2018-11-03 DIAGNOSIS — Z79890 Hormone replacement therapy (postmenopausal): Secondary | ICD-10-CM

## 2018-11-03 DIAGNOSIS — Z9181 History of falling: Secondary | ICD-10-CM | POA: Diagnosis not present

## 2018-11-03 DIAGNOSIS — Z7951 Long term (current) use of inhaled steroids: Secondary | ICD-10-CM

## 2018-11-03 DIAGNOSIS — E039 Hypothyroidism, unspecified: Secondary | ICD-10-CM | POA: Diagnosis present

## 2018-11-03 LAB — CBC WITH DIFFERENTIAL/PLATELET
Abs Immature Granulocytes: 0.01 10*3/uL (ref 0.00–0.07)
BASOS PCT: 1 %
Basophils Absolute: 0.1 10*3/uL (ref 0.0–0.1)
Eosinophils Absolute: 0.4 10*3/uL (ref 0.0–0.5)
Eosinophils Relative: 6 %
HCT: 31.8 % — ABNORMAL LOW (ref 36.0–46.0)
Hemoglobin: 9 g/dL — ABNORMAL LOW (ref 12.0–15.0)
Immature Granulocytes: 0 %
Lymphocytes Relative: 23 %
Lymphs Abs: 1.5 10*3/uL (ref 0.7–4.0)
MCH: 24.1 pg — AB (ref 26.0–34.0)
MCHC: 28.3 g/dL — ABNORMAL LOW (ref 30.0–36.0)
MCV: 85.3 fL (ref 80.0–100.0)
MONO ABS: 0.6 10*3/uL (ref 0.1–1.0)
MONOS PCT: 9 %
Neutro Abs: 4 10*3/uL (ref 1.7–7.7)
Neutrophils Relative %: 61 %
Platelets: 231 10*3/uL (ref 150–400)
RBC: 3.73 MIL/uL — ABNORMAL LOW (ref 3.87–5.11)
RDW: 17 % — ABNORMAL HIGH (ref 11.5–15.5)
WBC: 6.5 10*3/uL (ref 4.0–10.5)
nRBC: 0 % (ref 0.0–0.2)

## 2018-11-03 LAB — BASIC METABOLIC PANEL
Anion gap: 10 (ref 5–15)
BUN: 11 mg/dL (ref 8–23)
CO2: 25 mmol/L (ref 22–32)
Calcium: 9.1 mg/dL (ref 8.9–10.3)
Chloride: 107 mmol/L (ref 98–111)
Creatinine, Ser: 0.91 mg/dL (ref 0.44–1.00)
GFR calc Af Amer: >60 mL/min (ref >60)
GLUCOSE: 84 mg/dL (ref 70–99)
Potassium: 3.8 mmol/L (ref 3.5–5.1)
Sodium: 142 mmol/L (ref 135–145)

## 2018-11-03 MED ORDER — ACETAMINOPHEN 650 MG RE SUPP: 650.0000 mg | Freq: Four times a day (QID) | RECTAL | Status: DC | PRN

## 2018-11-03 MED ORDER — FOLIC ACID 1 MG PO TABS
1.0000 mg | ORAL_TABLET | Freq: Every day | ORAL | Status: DC
  Administered 2018-11-04 – 2018-11-06 (×3): 1 mg via ORAL
  Filled 2018-11-03 (×4): qty 1

## 2018-11-03 MED ORDER — VITAMIN B-12 1000 MCG PO TABS
1000.0000 ug | ORAL_TABLET | Freq: Every day | ORAL | Status: DC
  Administered 2018-11-04 – 2018-11-06 (×3): 1000 ug via ORAL
  Filled 2018-11-03 (×3): qty 1

## 2018-11-03 MED ORDER — FLUTICASONE-UMECLIDIN-VILANT 100-62.5-25 MCG/INH IN AEPB: 1.0000 | INHALATION_SPRAY | Freq: Every day | RESPIRATORY_TRACT | Status: DC

## 2018-11-03 MED ORDER — MOMETASONE FURO-FORMOTEROL FUM 200-5 MCG/ACT IN AERO
2.0000 | INHALATION_SPRAY | Freq: Two times a day (BID) | RESPIRATORY_TRACT | Status: DC
  Filled 2018-11-03: qty 8.8

## 2018-11-03 MED ORDER — SODIUM CHLORIDE 0.9% FLUSH
3.0000 mL | Freq: Two times a day (BID) | INTRAVENOUS | Status: DC
  Administered 2018-11-03 – 2018-11-06 (×6): 3 mL via INTRAVENOUS

## 2018-11-03 MED ORDER — OXYCODONE HCL 5 MG PO TABS
15.0000 mg | ORAL_TABLET | Freq: Four times a day (QID) | ORAL | Status: DC | PRN
  Administered 2018-11-03 – 2018-11-05 (×6): 15 mg via ORAL
  Filled 2018-11-03 (×7): qty 3

## 2018-11-03 MED ORDER — ORAL CARE MOUTH RINSE
15.0000 mL | Freq: Two times a day (BID) | OROMUCOSAL | Status: DC
  Administered 2018-11-03 – 2018-11-06 (×6): 15 mL via OROMUCOSAL

## 2018-11-03 MED ORDER — SODIUM CHLORIDE 0.9% FLUSH: 3.0000 mL | INTRAVENOUS | Status: DC | PRN

## 2018-11-03 MED ORDER — MORPHINE SULFATE (PF) 4 MG/ML IV SOLN
4.0000 mg | Freq: Once | INTRAVENOUS | Status: AC
  Administered 2018-11-03: 4 mg via INTRAMUSCULAR
  Filled 2018-11-03: qty 1

## 2018-11-03 MED ORDER — POLYETHYLENE GLYCOL 3350 17 G PO PACK
17.0000 g | PACK | Freq: Every day | ORAL | Status: DC
  Administered 2018-11-04 – 2018-11-06 (×3): 17 g via ORAL
  Filled 2018-11-03 (×3): qty 1

## 2018-11-03 MED ORDER — FLUTICASONE FUROATE-VILANTEROL 200-25 MCG/INH IN AEPB
1.0000 | INHALATION_SPRAY | Freq: Every day | RESPIRATORY_TRACT | Status: DC
  Administered 2018-11-04 – 2018-11-06 (×3): 1 via RESPIRATORY_TRACT
  Filled 2018-11-03: qty 28

## 2018-11-03 MED ORDER — HYDROXYZINE HCL 25 MG PO TABS
25.0000 mg | ORAL_TABLET | Freq: Once | ORAL | Status: DC
  Filled 2018-11-03 (×2): qty 1

## 2018-11-03 MED ORDER — SENNOSIDES-DOCUSATE SODIUM 8.6-50 MG PO TABS
1.0000 | ORAL_TABLET | Freq: Every day | ORAL | Status: DC
  Administered 2018-11-03 – 2018-11-05 (×2): 1 via ORAL
  Filled 2018-11-03 (×3): qty 1

## 2018-11-03 MED ORDER — ADULT MULTIVITAMIN W/MINERALS CH
1.0000 | ORAL_TABLET | Freq: Every day | ORAL | Status: DC
  Administered 2018-11-04 – 2018-11-06 (×3): 1 via ORAL
  Filled 2018-11-03 (×3): qty 1

## 2018-11-03 MED ORDER — HYDROXYZINE HCL 25 MG PO TABS: 25.0000 mg | ORAL_TABLET | Freq: Once | ORAL | Status: DC

## 2018-11-03 MED ORDER — BUPROPION HCL ER (XL) 150 MG PO TB24
450.0000 mg | ORAL_TABLET | Freq: Every day | ORAL | Status: DC
  Administered 2018-11-04 – 2018-11-06 (×3): 450 mg via ORAL
  Filled 2018-11-03 (×3): qty 3

## 2018-11-03 MED ORDER — LEVOTHYROXINE SODIUM 25 MCG PO TABS
25.0000 ug | ORAL_TABLET | Freq: Every day | ORAL | Status: DC
  Administered 2018-11-04 – 2018-11-06 (×3): 25 ug via ORAL
  Filled 2018-11-03 (×3): qty 1

## 2018-11-03 MED ORDER — ALBUTEROL SULFATE (2.5 MG/3ML) 0.083% IN NEBU: 3.0000 mL | INHALATION_SOLUTION | Freq: Four times a day (QID) | RESPIRATORY_TRACT | Status: DC | PRN

## 2018-11-03 MED ORDER — VITAMIN D 25 MCG (1000 UNIT) PO TABS
2000.0000 [IU] | ORAL_TABLET | Freq: Every day | ORAL | Status: DC
  Administered 2018-11-04 – 2018-11-06 (×3): 2000 [IU] via ORAL
  Filled 2018-11-03 (×3): qty 2

## 2018-11-03 MED ORDER — ACETAMINOPHEN 325 MG PO TABS
650.0000 mg | ORAL_TABLET | Freq: Four times a day (QID) | ORAL | Status: DC | PRN
  Administered 2018-11-03: 650 mg via ORAL
  Filled 2018-11-03: qty 2

## 2018-11-03 MED ORDER — UMECLIDINIUM BROMIDE 62.5 MCG/INH IN AEPB
1.0000 | INHALATION_SPRAY | Freq: Every day | RESPIRATORY_TRACT | Status: DC
  Administered 2018-11-04 – 2018-11-06 (×3): 1 via RESPIRATORY_TRACT
  Filled 2018-11-03: qty 7

## 2018-11-03 MED ORDER — POTASSIUM CHLORIDE CRYS ER 20 MEQ PO TBCR
20.0000 meq | EXTENDED_RELEASE_TABLET | Freq: Every day | ORAL | Status: DC
  Administered 2018-11-04 – 2018-11-05 (×2): 20 meq via ORAL
  Filled 2018-11-03 (×2): qty 1

## 2018-11-03 MED ORDER — MORPHINE SULFATE (PF) 4 MG/ML IV SOLN
4.0000 mg | Freq: Once | INTRAVENOUS | Status: AC
  Administered 2018-11-03: 4 mg via INTRAVENOUS
  Filled 2018-11-03: qty 1

## 2018-11-03 MED ORDER — TRAZODONE HCL 100 MG PO TABS
300.0000 mg | ORAL_TABLET | Freq: Every evening | ORAL | Status: DC | PRN
  Administered 2018-11-03 – 2018-11-06 (×3): 300 mg via ORAL
  Filled 2018-11-03 (×3): qty 3

## 2018-11-03 MED ORDER — SODIUM CHLORIDE 0.9 % IV SOLN: 250.0000 mL | INTRAVENOUS | Status: DC | PRN

## 2018-11-03 MED ORDER — ARIPIPRAZOLE 2 MG PO TABS
2.0000 mg | ORAL_TABLET | Freq: Every day | ORAL | Status: DC
  Administered 2018-11-04: 2 mg via ORAL
  Filled 2018-11-03 (×2): qty 1

## 2018-11-03 NOTE — H&P (Addendum)
[facility] Admission History and Physical Service Pager: [phone]  Patient name: Traci [name] Medical record number: [id number] Date of birth: [birth year] Age: 73 y.o. Gender: female  Primary Care Provider: Raelyn Number, MD Consultants: ortho Code Status: partial DNR (DNI)  Chief Complaint: "I fell"  Assessment and Plan: Traci [name] is a 73 y.o. female presenting with right sacral alar fracture secondary to a mechanical fall. PMH is significant for HFpEF with severe AI, osteoarthritis, anxiety, essential tremor, COPD with chronic respiratory failure, and hypothyroidism.  Sacral Ala fracture d/t mechanical fall: Patient fell on Wednesday and was given pain medication at the ED on Thursday for pain control because all her imaging did not show any fractures. It did not work, patient continued to be in pain and was unable to ambulate. CT Hip Right showed acute fracture of the sacral ala. Ortho following. No surgical intervention, patient will be given medication for pain control.  No history concerning for worsening of AI, or ACS. No history of CVA or seizure like activity. -Admit to med-surg, attending Dr. Ardelia Mems -PT/OT evelaution -oxycodone 15 mg Q6 -Appreciate ortho recommendations -Social work for SNF placement  Anxiety/Mood Disorder: Patient is continuing her home medication of Abilify, trazodone and Wellbutrin  Hypothyroidism: Patient will continue home medication of levothyroxine  COPD with chronic respiratory failure requiring oxygen: Patient will have PRNs of her home medications of Symbicort, trelegy and albuterol. She will continue home oxygen of 2 L. Ideal O2 saturation 88% and above. -incentive spirometer   FEN/GI: Regular Prophylaxis: SCDs  Disposition: Discharge pending SNF placement  History of Present Illness:  Traci [name] is a 73 y.o. female presenting with bilateral hip pain after falling on Wednesday. Patient says she  was weighing herself with her husband and went to go get off the scale and ended up falling and landing on her buttocks. She did not hit her head or experience LOC.  She states she falls often due to balance and weakness. Patient assumed it was just muscular pain and did not seek help until the next day. She stated she had imaging done which did not show any acute fractures and was sent home with pain medication. The medication did not help and the patient came into the ED today because she was having difficulty ambulating and continued pain. Patient denies any fever, chest pain, dizziness or LOC.   Review Of Systems: Per HPI with the following additions:  Review of Systems  Constitutional: Positive for malaise/fatigue. Negative for chills and fever.  HENT: Negative for congestion and ear pain.   Eyes: Negative for blurred vision and double vision.  Respiratory: Positive for shortness of breath. Negative for cough, sputum production and wheezing.   Cardiovascular: Negative for chest pain, palpitations and leg swelling.  Gastrointestinal: Negative for blood in stool, constipation, nausea and vomiting.  Musculoskeletal: Positive for falls and joint pain. Negative for back pain and neck pain.  Skin: Negative for itching and rash.  Neurological: Positive for weakness. Negative for focal weakness.    Patient Active Problem List   Diagnosis Date Noted  . AKI (acute kidney injury) ([place]) 07/23/2018  . Fall 07/23/2018  . Abdominal pain   . Contusion of left chest wall   . Severe aortic insufficiency 02/04/2018  . Tremor   . Hypertension   . Hyperlipidemia   . H/O hiatal hernia   . GERD (gastroesophageal reflux disease)   . Dyspepsia   . Depression   . COPD (  chronic obstructive pulmonary disease) ([place])   . Chronic lower back pain   . Chronic bronchitis ([place])   . Arthritis   . Anxiety   . Anginal pain ([place])   . DNR (do not resuscitate) 02/13/2017  . Shortness of breath 09/14/2016  .  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 Acute on chronic diastolic CHF (congestive heart failure), NYHA class 3 ([place])   . CHF (congestive heart failure) ([place]) 08/16/2016  . Anemia 08/16/2016  . Chronic back pain 08/16/2016  . Hypothyroidism 08/16/2016  . Benign essential tremor 08/16/2016  . Acute respiratory failure with hypoxia ([place]) 04/01/2014  . Sinus tachycardia 03/31/2014  . Fractured tibia and fibula 03/26/2014  . Right tibial fracture 03/26/2014  . Closed right tibial fracture 03/26/2014  . Unstable gait 03/26/2014  . Chest pain syndrome 02/12/2012  . Spinal stenosis 09/24/2011  . HEARTBURN 08/03/2010  . BENIGN NEOPLASM OF ADRENAL GLAND 07/28/2010  . Anxiety disorder 07/28/2010  . MYOCARDIAL INFARCTION, HX OF 07/28/2010  . Chronic obstructive pulmonary disease ([place]) 07/28/2010  . DIVERTICULOSIS, COLON 07/28/2010  . COLONIC POLYPS, ADENOMATOUS, HX OF 07/28/2010  . HLD (hyperlipidemia) 06/23/2010  . HTN (hypertension) 06/23/2010  . ACUTE DILATATION OF STOMACH 06/23/2010  . ABDOMINAL PAIN, GENERALIZED 06/23/2010  . History of blood transfusion 08/20/1961    Past Medical History: Past Medical History:  Diagnosis Date  . Anginal pain ([place])   . Anxiety   . Arthritis   . Chronic bronchitis ([place])   . Chronic lower back pain   . COPD (chronic obstructive pulmonary disease) ([place])   . Depression   . Dyspepsia   . GERD (gastroesophageal reflux disease)   . H/O hiatal hernia   . OIZTIWPY(099.8)    "1-2/week" (08/16/2016)  . History of blood transfusion 1963   w/childbirth  . Hyperlipidemia   . Hypertension   . Hypothyroidism   . Shortness of breath 02/12/12   "all the time"  . Tremor     Past Surgical History: Past Surgical History:  Procedure Laterality Date  . BACK SURGERY    . CARDIAC CATHETERIZATION  04/19/1995   EF 65-70%  . CATARACT EXTRACTION W/ INTRAOCULAR LENS  IMPLANT, BILATERAL  2012  . FRACTURE SURGERY    . LAPAROSCOPIC  CHOLECYSTECTOMY  2010  . LEFT HEART CATHETERIZATION WITH CORONARY ANGIOGRAM N/A 02/13/2012   Procedure: LEFT HEART CATHETERIZATION WITH CORONARY ANGIOGRAM;  Surgeon: Thayer Headings, MD;  Location: [facility] CATH LAB;  Service: Cardiovascular;  Laterality: N/A;  . LUMBAR DISC SURGERY  early 2000's  . TEE WITHOUT CARDIOVERSION N/A 08/22/2016   Procedure: TRANSESOPHAGEAL ECHOCARDIOGRAM (TEE);  Surgeon: Sanda Klein, MD;  Location: [facility];  Service: Cardiovascular;  Laterality: N/A;  . TIBIA IM NAIL INSERTION Right 03/30/2014   Procedure: INTRAMEDULLARY (IM) NAIL TIBIAL;  Surgeon: Wylene Simmer, MD;  Location: [facility];  Service: Orthopedics;  Laterality: Right;  . US ECHOCARDIOGRAPHY  12/17/2007   EF 55-60%  . VAGINAL HYSTERECTOMY  1980's    Social History: Social History   Tobacco Use  . Smoking status: Former Smoker    Packs/day: 0.10    Years: 55.00    Pack years: 5.50    Types: Cigarettes    Last attempt to quit: 08/19/2016    Years since quitting: 2.2  . Smokeless tobacco: Former Systems developer    Quit date: 08/13/2016  . Tobacco comment: Encouraged to remain smoke free  Substance Use Topics  . Alcohol use: No    Alcohol/week: 0.0  standard drinks  . Drug use: No    Family History: Family History  Problem Relation Age of Onset  . Heart attack Father   . Obesity Sister   . Emphysema Sister   . Heart disease Brother     Allergies and Medications: Allergies  Allergen Reactions  . Cephalexin Hives  . Cymbalta [Duloxetine Hcl] Other (See Comments)    Sees spiders  . Latex     " MAKES ME ITCHY "    No current facility-administered medications on file prior to encounter.    Current Outpatient Medications on File Prior to Encounter  Medication Sig Dispense Refill  . albuterol (PROVENTIL HFA;VENTOLIN HFA) 108 (90 Base) MCG/ACT inhaler Inhale 2 puffs into the lungs every 6 (six) hours as needed for wheezing or shortness of breath. 1 Inhaler 2  . ARIPiprazole (ABILIFY) 2 MG tablet Take 1  tablet by mouth daily.    Marland Kitchen buPROPion (WELLBUTRIN XL) 150 MG 24 hr tablet Take 450 mg by mouth daily.     . Cholecalciferol (VITAMIN D3) 2000 units TABS Take 2,000 Units by mouth daily.    . Fluticasone-Umeclidin-Vilant (TRELEGY ELLIPTA) 100-62.5-25 MCG/INH AEPB Inhale 1 puff into the lungs daily. 60 each 6  . folic acid (FOLVITE) 1 MG tablet Take 1 tablet (1 mg total) by mouth daily. 30 tablet 0  . levothyroxine (SYNTHROID, LEVOTHROID) 25 MCG tablet Take 25 mcg by mouth daily before breakfast.     . omeprazole (PRILOSEC) 20 MG capsule Take 20 mg by mouth daily.    Marland Kitchen oxyCODONE (ROXICODONE) 15 MG immediate release tablet Take 15 mg by mouth every 6 (six) hours as needed for pain.    . OXYGEN Inhale 2 L into the lungs as needed.    . potassium chloride SA (K-DUR,KLOR-CON) 20 MEQ tablet Take 1 tablet (20 mEq total) by mouth daily. 30 tablet 0  . SYMBICORT 160-4.5 MCG/ACT inhaler TAKE 2 PUFFS BY MOUTH TWICE A DAY (Patient taking differently: Inhale 2 puffs into the lungs 2 (two) times daily. ) 10.2 Inhaler 2  . traZODone (DESYREL) 150 MG tablet Take 300 mg by mouth at bedtime as needed for sleep.     . vitamin B-12 (CYANOCOBALAMIN) 1000 MCG tablet Take 1,000 mcg by mouth daily.      Objective: BP (!) 124/52   Pulse 78   Temp 98 F (36.7 C) (Oral)   Resp 16   SpO2 99%  Exam: Physical Exam  Constitutional: She is oriented to person, place, and time. She appears distressed.  HENT:  Head: Normocephalic and atraumatic.  Eyes: Pupils are equal, round, and reactive to light.  Neck: Normal range of motion. Neck supple.  Cardiovascular: Normal rate, regular rhythm and normal heart sounds. Exam reveals no gallop and no friction rub.  No murmur heard. Pulmonary/Chest: Effort normal. She has no wheezes.  Abdominal: Soft. Bowel sounds are normal. There is abdominal tenderness.  Musculoskeletal:        General: No deformity or edema.     Comments: Right hip tenderness  Neurological: She is alert  and oriented to person, place, and time.  CNII-XII intact  Skin: She is not diaphoretic.  Psychiatric: Mood, memory, affect and judgment normal.    Labs and Imaging: CBC BMET  Recent Labs  Lab 11/03/18 1340  WBC 6.5  HGB 9.0*  HCT 31.8*  PLT 231   Recent Labs  Lab 11/03/18 1340  NA 142  K 3.8  CL 107  CO2 25  BUN  11  CREATININE 0.91  GLUCOSE 84  CALCIUM 9.1     CT Hip: Acute fracture of the right sacral ala is incompletely imaged.  Negative for hip fracture.  Moderate right hip osteoarthritis.   [name], Medical Student, MS4 11/03/2018, 2:50 PM FPTS Intern pager: [phone], text pages welcome  I have personally seen and examined this patient with [name] and agree with the above note. The following is my additional documentation.   Physical Exam: General: frail elderly woman lying in bed, NAD with non-toxic appearance HEENT: normocephalic, atraumatic, moist mucous membranes, PERRLA, EOMI Neck: supple, non-tender without lymphadenopathy Cardiovascular: regular rate and rhythm without murmurs, rubs, or gallops Lungs: clear to auscultation bilaterally with normal work of breathing Abdomen: soft, non-tender, non-distended, normoactive bowel sounds Skin: warm, dry, no rashes or lesions, cap refill < 2 seconds Extremities: warm and well perfused, normal tone, no edema, 3/5 motor strength on RLE, 5/5 on LLE, sensation intact throughout Neuro: CNII-XII intact, no dysarthria or ptosis, essential tremor present  Assessment/Plan Traci [name] is a 73 y.o. female presenting with right hip pain following mechanical fall 5 days ago found to have right sacral ala fracture.  1.  Right sacral ala fracture d/t mechanical fall: Fall history sounds mechanical due to loss of balance while weighing herself. Non-surgical candidate. Failed outpatient management with opioids. High risk for falls. Unsafe home environment given frail husband is recovering from cardiac  surgery. WBAT and pain control per ortho recs with outpatient follow-up. 2.  HFpEF with severe aortic insufficiency: Last seen by CVTS in September. Suspect COPD plays bigger role in chronic SOB. Poor surgical candidate. History of fall not consistent with AV insufficieny.  3.  COPD with chronic respiratory failure: Chronically on 2 L [place] at home. Former smoker. Followed by pulmonology with severe reduction in diffusion capacity. Without SOB on arrival. Continue supplemental O2 with goal sat >88%. 4.  Hypothyroidism: Continue synthroid. 5.  Essential tremor and GAD: Chronic. Continue home meds. 6.  Primary hypertension: Stable. No hypotension. Continue     Harriet [place], [place], PGY-3

## 2018-11-03 NOTE — Progress Notes (Signed)
Received Traci [name]. [name] 73 y/o from ED with dx of right sacral ala fx. Patient stable on arrival and per orders, may ambulate with WBAT with this type of fx. Reviewed code status with patient-Do not intubate, otherwise may resuscitate. Orders reviewed. Assessment in progress.

## 2018-11-03 NOTE — Consult Note (Addendum)
Reason for Consult:Sacral fx Referring Physician: [name] is an 73 y.o. female.  HPI: Traci [name] fell at home on 3/9. She had immediate right hip/back pain and could not get up or ambulate. She was brought to the hospital but x-rays were negative and she was discharged. She's been unable to walk at home and has essentially been bedridden since then. She finally couldn't take it anymore and came back where a CT showed a right sacral fx and orthopedic surgery was consulted. She c/o right lower back pain c/w the location of her fx.  Past Medical History:  Diagnosis Date  . Anginal pain ([place])   . Anxiety   . Arthritis   . Chronic bronchitis ([place])   . Chronic lower back pain   . COPD (chronic obstructive pulmonary disease) ([place])   . Depression   . Dyspepsia   . GERD (gastroesophageal reflux disease)   . H/O hiatal hernia   . ZYYQMGNO(037.0)    "1-2/week" (08/16/2016)  . History of blood transfusion 1963   w/childbirth  . Hyperlipidemia   . Hypertension   . Hypothyroidism   . Shortness of breath 02/12/12   "all the time"  . Tremor     Past Surgical History:  Procedure Laterality Date  . BACK SURGERY    . CARDIAC CATHETERIZATION  04/19/1995   EF 65-70%  . CATARACT EXTRACTION W/ INTRAOCULAR LENS  IMPLANT, BILATERAL  2012  . FRACTURE SURGERY    . LAPAROSCOPIC CHOLECYSTECTOMY  2010  . LEFT HEART CATHETERIZATION WITH CORONARY ANGIOGRAM N/A 02/13/2012   Procedure: LEFT HEART CATHETERIZATION WITH CORONARY ANGIOGRAM;  Surgeon: Thayer Headings, MD;  Location: [facility] CATH LAB;  Service: Cardiovascular;  Laterality: N/A;  . LUMBAR DISC SURGERY  early 2000's  . TEE WITHOUT CARDIOVERSION N/A 08/22/2016   Procedure: TRANSESOPHAGEAL ECHOCARDIOGRAM (TEE);  Surgeon: Sanda Klein, MD;  Location: [facility];  Service: Cardiovascular;  Laterality: N/A;  . TIBIA IM NAIL INSERTION Right 03/30/2014   Procedure: INTRAMEDULLARY (IM) NAIL TIBIAL;  Surgeon: Wylene Simmer, MD;  Location: [facility];   Service: Orthopedics;  Laterality: Right;  . US ECHOCARDIOGRAPHY  12/17/2007   EF 55-60%  . VAGINAL HYSTERECTOMY  1980's    Family History  Problem Relation Age of Onset  . Heart attack Father   . Obesity Sister   . Emphysema Sister   . Heart disease Brother     Social History:  reports that she quit smoking about 2 years ago. Her smoking use included cigarettes. She has a 5.50 pack-year smoking history. She quit smokeless tobacco use about 2 years ago. She reports that she does not drink alcohol or use drugs.  Allergies:  Allergies  Allergen Reactions  . Cephalexin Hives  . Cymbalta [Duloxetine Hcl] Other (See Comments)    Sees spiders  . Latex     " MAKES ME ITCHY "     Medications: I have reviewed the patient's current medications.  Results for orders placed or performed during the hospital encounter of 11/03/18 (from the past 48 hour(s))  Basic metabolic panel     Status: None   Collection Time: 11/03/18  1:40 PM  Result Value Ref Range   Sodium 142 135 - 145 mmol/L   Potassium 3.8 3.5 - 5.1 mmol/L   Chloride 107 98 - 111 mmol/L   CO2 25 22 - 32 mmol/L   Glucose, Bld 84 70 - 99 mg/dL   BUN 11 8 - 23 mg/dL   Creatinine, Ser  0.91 0.44 - 1.00 mg/dL   Calcium 9.1 8.9 - 10.3 mg/dL   GFR calc non Af Amer >60 >60 mL/min   GFR calc Af Amer >60 >60 mL/min   Anion gap 10 5 - 15    Comment: Performed at [facility] [address] [place], [place] [address]  CBC with Differential     Status: Abnormal   Collection Time: 11/03/18  1:40 PM  Result Value Ref Range   WBC 6.5 4.0 - 10.5 K/uL   RBC 3.73 (L) 3.87 - 5.11 MIL/uL   Hemoglobin 9.0 (L) 12.0 - 15.0 g/dL   HCT 31.8 (L) 36.0 - 46.0 %   MCV 85.3 80.0 - 100.0 fL   MCH 24.1 (L) 26.0 - 34.0 pg   MCHC 28.3 (L) 30.0 - 36.0 g/dL   RDW 17.0 (H) 11.5 - 15.5 %   Platelets 231 150 - 400 K/uL   nRBC 0.0 0.0 - 0.2 %   Neutrophils Relative % 61 %   Neutro Abs 4.0 1.7 - 7.7 K/uL   Lymphocytes Relative 23 %   Lymphs Abs  1.5 0.7 - 4.0 K/uL   Monocytes Relative 9 %   Monocytes Absolute 0.6 0.1 - 1.0 K/uL   Eosinophils Relative 6 %   Eosinophils Absolute 0.4 0.0 - 0.5 K/uL   Basophils Relative 1 %   Basophils Absolute 0.1 0.0 - 0.1 K/uL   Immature Granulocytes 0 %   Abs Immature Granulocytes 0.01 0.00 - 0.07 K/uL    Comment: Performed at [facility], [place] [address] [place], [place] [address]    Ct Hip Right Wo Contrast  Result Date: 11/03/2018 CLINICAL DATA:  The patient suffered a right hip injury when she fell backwards 10/27/2018. Continued pain. Subsequent encounter. EXAM: CT OF THE RIGHT HIP WITHOUT CONTRAST TECHNIQUE: Multidetector CT imaging of the right hip was performed according to the standard protocol. Multiplanar CT image reconstructions were also generated. COMPARISON:  Plain films right hip 10/28/2018. FINDINGS: Bones/Joint/Cartilage The patient has an acute fracture of the right sacrum. No other fracture is identified. The patient has moderate appearing right hip osteoarthritis with joint space narrowing, small subchondral cyst in the femoral head and some osteophytosis present. Ligaments Suboptimally assessed by CT. Muscles and Tendons Intact and normal in appearance. Soft tissues Imaged intrapelvic contents are negative. IMPRESSION: Acute fracture of the right sacral ala is incompletely imaged. Negative for hip fracture. Moderate right hip osteoarthritis. Electronically Signed   By: Inge Rise M.D.   On: 11/03/2018 13:18    Review of Systems  Constitutional: Negative for weight loss.  HENT: Negative for ear discharge, ear pain, hearing loss and tinnitus.   Eyes: Negative for blurred vision, double vision, photophobia and pain.  Respiratory: Negative for cough, sputum production and shortness of breath.   Cardiovascular: Negative for chest pain.  Gastrointestinal: Negative for abdominal pain, nausea and vomiting.  Genitourinary: Negative for dysuria, flank pain, frequency and  urgency.  Musculoskeletal: Positive for back pain. Negative for falls, joint pain, myalgias and neck pain.  Neurological: Negative for dizziness, tingling, sensory change, focal weakness, loss of consciousness and headaches.  Endo/Heme/Allergies: Does not bruise/bleed easily.  Psychiatric/Behavioral: Negative for depression, memory loss and substance abuse. The patient is not nervous/anxious.    Blood pressure (!) 124/52, pulse 78, temperature 98 F (36.7 C), temperature source Oral, resp. rate 16, SpO2 99 %. Physical Exam  Constitutional: She appears well-developed and well-nourished. No distress.  HENT:  Head:  Normocephalic and atraumatic.  Eyes: Conjunctivae are normal. Right eye exhibits no discharge. Left eye exhibits no discharge. No scleral icterus.  Neck: Normal range of motion.  Cardiovascular: Normal rate and regular rhythm.  Respiratory: Effort normal. No respiratory distress.  Musculoskeletal:     Comments: Pelvis--no traumatic wounds or rash, no ecchymosis, stable to manual stress, nontender   RLE No traumatic wounds, ecchymosis, or rash  Nontender  No knee or ankle effusion  Knee stable to varus/ valgus and anterior/posterior stress  Sens DPN, SPN, TN paresthetic  Motor EHL, ext, flex, evers 5/5  DP 2+, PT 1+, No significant edema   Neurological: She is alert.  Skin: Skin is warm and dry. She is not diaphoretic.  Psychiatric: She has a normal mood and affect. Her behavior is normal.    Assessment/Plan: Right sacral ala fx -- She may be WBAT for this fx. Will need pain control and PT/OT. She should f/u with Dr. Grandville Silos upon discharge. Multiple medical problems including osteoarthritis, anxiety, COPD, and hypothyroidism -- per primary service    Lisette Abu, PA-C Orthopedic Surgery [phone] 11/03/2018, 3:43 PM   Hx as above.  Baseline ambulates at home with walker, out of home occasionally. R LE with minimal pain with passive ER/IR/flex of hip.   Pain worse with attempts at SLR.  Global altered sensibility of Bilateral feet.  CT--relatively ND sacral ala fx  Recommendations: WBAT RLE, due to pain, may be limited to transfers/wheelchair for now  RTC approx 1 month with new AP and lateral sacrum [place], [place] [phone]

## 2018-11-03 NOTE — ED Provider Notes (Addendum)
[facility] EMERGENCY DEPARTMENT Provider Note   CSN: [id number] Arrival date & time: 11/03/18  1109    History   Chief Complaint Chief Complaint  Patient presents with   Fall   Hip Pain    HPI Traci [name] is a 73 y.o. female.     73 year old female with prior medical history as detailed below presents for evaluation of right hip and right low back pain.  Patient reports that she fell last week.  She was evaluated at that time in the ED.  X-rays of the right hip did not show acute fracture.  She has been at home taking narcotic pain medications without adequate pain control.  She walks with a walker at baseline.  She reports that secondary to her pain she has been unable to ambulate over the last 2 days.  She lives at home with her elderly husband.  She denies other complaint.  She specifically denies fever, cough, chest pain, shortness of breath, or other acute problems.  The history is provided by the patient and medical records.  Fall  The current episode started more than 1 week ago. The problem occurs constantly. The problem has not changed since onset.Pertinent negatives include no chest pain and no abdominal pain. The symptoms are aggravated by walking. Nothing relieves the symptoms.  Hip Pain  Pertinent negatives include no chest pain and no abdominal pain.    Past Medical History:  Diagnosis Date   Anginal pain (HCC)    Anxiety    Arthritis    Chronic bronchitis (HCC)    Chronic lower back pain    COPD (chronic obstructive pulmonary disease) (HCC)    Depression    Dyspepsia    GERD (gastroesophageal reflux disease)    H/O hiatal hernia    Headache(784.0)    "1-2/week" (08/16/2016)   History of blood transfusion 1963   w/childbirth   Hyperlipidemia    Hypertension    Hypothyroidism    Shortness of breath 02/12/12   "all the time"   Tremor     Patient Active Problem List   Diagnosis Date Noted   AKI (acute kidney  injury) ([place]) 07/23/2018   Fall 07/23/2018   Abdominal pain    Contusion of left chest wall    Severe aortic insufficiency 02/04/2018   Tremor    Hypertension    Hyperlipidemia    H/O hiatal hernia    GERD (gastroesophageal reflux disease)    Dyspepsia    Depression    COPD (chronic obstructive pulmonary disease) (HCC)    Chronic lower back pain    Chronic bronchitis (HCC)    Arthritis    Anxiety    Anginal pain ([place])    DNR (do not resuscitate) 02/13/2017   Shortness of breath 09/14/2016   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    Acute on chronic diastolic CHF (congestive heart failure), NYHA class 3 ([place])    CHF (congestive heart failure) ([place]) 08/16/2016   Anemia 08/16/2016   Chronic back pain 08/16/2016   Hypothyroidism 08/16/2016   Benign essential tremor 08/16/2016   Acute respiratory failure with hypoxia ([place]) 04/01/2014   Sinus tachycardia 03/31/2014   Fractured tibia and fibula 03/26/2014   Right tibial fracture 03/26/2014   Closed right tibial fracture 03/26/2014   Unstable gait 03/26/2014   Chest pain syndrome 02/12/2012   Spinal stenosis 09/24/2011   HEARTBURN 08/03/2010   BENIGN NEOPLASM OF ADRENAL GLAND 07/28/2010   Anxiety disorder 07/28/2010  MYOCARDIAL INFARCTION, HX OF 07/28/2010   Chronic obstructive pulmonary disease ([place]) 07/28/2010   DIVERTICULOSIS, COLON 07/28/2010   COLONIC POLYPS, ADENOMATOUS, HX OF 07/28/2010   HLD (hyperlipidemia) 06/23/2010   HTN (hypertension) 06/23/2010   ACUTE DILATATION OF STOMACH 06/23/2010   ABDOMINAL PAIN, GENERALIZED 06/23/2010   History of blood transfusion 08/20/1961    Past Surgical History:  Procedure Laterality Date   BACK SURGERY     CARDIAC CATHETERIZATION  04/19/1995   EF 65-70%   CATARACT EXTRACTION W/ INTRAOCULAR LENS  IMPLANT, BILATERAL  2012   FRACTURE SURGERY     LAPAROSCOPIC CHOLECYSTECTOMY  2010   LEFT HEART CATHETERIZATION WITH  CORONARY ANGIOGRAM N/A 02/13/2012   Procedure: LEFT HEART CATHETERIZATION WITH CORONARY ANGIOGRAM;  Surgeon: Thayer Headings, MD;  Location: [facility] LAB;  Service: Cardiovascular;  Laterality: N/A;   LUMBAR DISC SURGERY  early 2000's   TEE WITHOUT CARDIOVERSION N/A 08/22/2016   Procedure: TRANSESOPHAGEAL ECHOCARDIOGRAM (TEE);  Surgeon: Sanda Klein, MD;  Location: [facility] ENDOSCOPY;  Service: Cardiovascular;  Laterality: N/A;   TIBIA IM NAIL INSERTION Right 03/30/2014   Procedure: INTRAMEDULLARY (IM) NAIL TIBIAL;  Surgeon: Wylene Simmer, MD;  Location: [place];  Service: Orthopedics;  Laterality: Right;   US ECHOCARDIOGRAPHY  12/17/2007   EF 55-60%   VAGINAL HYSTERECTOMY  1980's     OB History   No obstetric history on file.      Home Medications    Prior to Admission medications   Medication Sig Start Date End Date Taking? Authorizing Provider  albuterol (PROVENTIL HFA;VENTOLIN HFA) 108 (90 Base) MCG/ACT inhaler Inhale 2 puffs into the lungs every 6 (six) hours as needed for wheezing or shortness of breath. 01/22/17   Mannam, Hart Robinsons, MD  ARIPiprazole (ABILIFY) 2 MG tablet Take 1 tablet by mouth daily. 01/10/18   [provider]  buPROPion (WELLBUTRIN XL) 150 MG 24 hr tablet Take 450 mg by mouth daily.     [provider]  Cholecalciferol (VITAMIN D3) 2000 units TABS Take 2,000 Units by mouth daily.    [provider]  Fluticasone-Umeclidin-Vilant (TRELEGY ELLIPTA) 100-62.5-25 MCG/INH AEPB Inhale 1 puff into the lungs daily. 09/13/17   Mannam, Hart Robinsons, MD  folic acid (FOLVITE) 1 MG tablet Take 1 tablet (1 mg total) by mouth daily. 08/26/16   Elgergawy, Silver Huguenin, MD  levothyroxine (SYNTHROID, LEVOTHROID) 25 MCG tablet Take 25 mcg by mouth daily before breakfast.  08/31/14   [provider]  omeprazole (PRILOSEC) 20 MG capsule Take 20 mg by mouth daily.    [provider]  oxyCODONE (ROXICODONE) 15 MG immediate release tablet Take 15 mg by mouth every 6  (six) hours as needed for pain.    [provider]  OXYGEN Inhale 2 L into the lungs as needed.    [provider]  potassium chloride SA (K-DUR,KLOR-CON) 20 MEQ tablet Take 1 tablet (20 mEq total) by mouth daily. 08/26/16   Elgergawy, Silver Huguenin, MD  SYMBICORT 160-4.5 MCG/ACT inhaler TAKE 2 PUFFS BY MOUTH TWICE A DAY Patient taking differently: Inhale 2 puffs into the lungs 2 (two) times daily.  02/06/18   Mannam, Hart Robinsons, MD  traZODone (DESYREL) 150 MG tablet Take 300 mg by mouth at bedtime as needed for sleep.  08/31/14   [provider]  vitamin B-12 (CYANOCOBALAMIN) 1000 MCG tablet Take 1,000 mcg by mouth daily.    [provider]    Family History Family History  Problem Relation Age of Onset   Heart attack  Father    Obesity Sister    Emphysema Sister    Heart disease Brother     Social History Social History   Tobacco Use   Smoking status: Former Smoker    Packs/day: 0.10    Years: 55.00    Pack years: 5.50    Types: Cigarettes    Last attempt to quit: 08/19/2016    Years since quitting: 2.2   Smokeless tobacco: Former Systems developer    Quit date: 08/13/2016   Tobacco comment: Encouraged to remain smoke free  Substance Use Topics   Alcohol use: No    Alcohol/week: 0.0 standard drinks   Drug use: No     Allergies   Cephalexin; Cymbalta [duloxetine hcl]; and Latex   Review of Systems Review of Systems  Cardiovascular: Negative for chest pain.  Gastrointestinal: Negative for abdominal pain.  All other systems reviewed and are negative.    Physical Exam Updated Vital Signs BP (!) 124/52    Pulse 78    Temp 98 F (36.7 C) (Oral)    Resp 16    SpO2 99%   Physical Exam Vitals signs and nursing note reviewed.  Constitutional:      General: She is not in acute distress.    Appearance: She is well-developed.  HENT:     Head: Normocephalic and atraumatic.  Eyes:     Conjunctiva/sclera: Conjunctivae normal.     Pupils: Pupils  are equal, round, and reactive to light.  Neck:     Musculoskeletal: Normal range of motion and neck supple.  Cardiovascular:     Rate and Rhythm: Normal rate and regular rhythm.     Heart sounds: Normal heart sounds.  Pulmonary:     Effort: Pulmonary effort is normal. No respiratory distress.     Breath sounds: Normal breath sounds.  Abdominal:     General: There is no distension.     Palpations: Abdomen is soft.     Tenderness: There is no abdominal tenderness.  Musculoskeletal: Normal range of motion.        General: No deformity.     Comments: Tenderness with palpation to the right lateral hip and right posterior hip.  AROM of right hip is limited secondary to pain  Skin:    General: Skin is warm and dry.  Neurological:     Mental Status: She is alert and oriented to person, place, and time.      ED Treatments / Results  Labs (all labs ordered are listed, but only abnormal results are displayed) Labs Reviewed  CBC WITH DIFFERENTIAL/PLATELET - Abnormal; Notable for the following components:      Result Value   RBC 3.73 (*)    Hemoglobin 9.0 (*)    HCT 31.8 (*)    MCH 24.1 (*)    MCHC 28.3 (*)    RDW 17.0 (*)    All other components within normal limits  BASIC METABOLIC PANEL    EKG None  Radiology Ct Hip Right Wo Contrast  Result Date: 11/03/2018 CLINICAL DATA:  The patient suffered a right hip injury when she fell backwards 10/27/2018. Continued pain. Subsequent encounter. EXAM: CT OF THE RIGHT HIP WITHOUT CONTRAST TECHNIQUE: Multidetector CT imaging of the right hip was performed according to the standard protocol. Multiplanar CT image reconstructions were also generated. COMPARISON:  Plain films right hip 10/28/2018. FINDINGS: Bones/Joint/Cartilage The patient has an acute fracture of the right sacrum. No other fracture is identified. The patient has moderate appearing right hip  osteoarthritis with joint space narrowing, small subchondral cyst in the femoral head  and some osteophytosis present. Ligaments Suboptimally assessed by CT. Muscles and Tendons Intact and normal in appearance. Soft tissues Imaged intrapelvic contents are negative. IMPRESSION: Acute fracture of the right sacral ala is incompletely imaged. Negative for hip fracture. Moderate right hip osteoarthritis. Electronically Signed   By: Inge Rise M.D.   On: 11/03/2018 13:18    Procedures Procedures (including critical care time)  Medications Ordered in ED Medications  morphine 4 MG/ML injection 4 mg (4 mg Intramuscular Given 11/03/18 1127)  morphine 4 MG/ML injection 4 mg (4 mg Intravenous Given 11/03/18 1407)     Initial Impression / Assessment and Plan / ED Course  I have reviewed the triage vital signs and the nursing notes.  Pertinent labs & imaging results that were available during my care of the patient were reviewed by me and considered in my medical decision making (see chart for details).        MDM  Screen complete  Patient is presenting for evaluation of continuous right hip and right low back pain following a recent fall.  CT imaging demonstrates fracture of the right sacral ala.   Patient is unable to bear weight safely secondary to her pain.  Screening labs otherwise are without significant abnormality.   Medicine is aware of case and will evaluate for admission.  Ortho is aware of case and will consult.  Final Clinical Impressions(s) / ED Diagnoses   Final diagnoses:  Right hip pain  Closed fracture of sacrum, unspecified portion of sacrum, initial encounter [facility])    ED Discharge Orders    None       Valarie Merino, MD 11/03/18 1444    Valarie Merino, MD 11/03/18 [phone]

## 2018-11-03 NOTE — ED Triage Notes (Signed)
Pt from home via ems; c/o R hip pain and difficulty walking today; pt had a mechanical fall 1 week ago, was evaluated (scans negative) and discharged home w/ pain management; pt says she cant sit up or walk w/o pain  142/90 P 85 93% 2L (wears all the time) RR 20

## 2018-11-03 NOTE — ED Notes (Addendum)
ED TO INPATIENT HANDOFF REPORT  ED Nurse Name and Phone #: Nigel Mormon [phone]  S Name/Age/Gender Traci [name] 73 y.o. female Room/Bed: 042C/042C  Code Status   Code Status: Prior  Home/SNF/Other Home Patient oriented to: self, place, time and situation Is this baseline? Yes   Triage Complete: Triage complete  Chief Complaint R Hip Pain  Triage Note Pt from home via ems; c/o R hip pain and difficulty walking today; pt had a mechanical fall 1 week ago, was evaluated (scans negative) and discharged home w/ pain management; pt says she cant sit up or walk w/o pain  142/90 P 85 93% 2L (wears all the time) RR 20   Allergies Allergies  Allergen Reactions  . Cephalexin Hives  . Cymbalta [Duloxetine Hcl] Other (See Comments)    Sees spiders  . Latex     " MAKES ME ITCHY "     Level of Care/Admitting Diagnosis ED Disposition    ED Disposition Condition [facility] Area: [place] [[id number]]  Level of Care: Med-Surg [16]  Diagnosis: Hip fracture [facility]) [[id number]]  Admitting Physician: [place] Bing [[id number]]  Attending Physician: Leeanne Rio [phone]  Estimated length of stay: 3 - 4 days  Certification:: I certify this patient will need inpatient services for at least 2 midnights  PT Class (Do Not Modify): Inpatient [101]  PT Acc Code (Do Not Modify): Private [1]       B Medical/Surgery History Past Medical History:  Diagnosis Date  . Anginal pain ([place])   . Anxiety   . Arthritis   . Chronic bronchitis ([place])   . Chronic lower back pain   . COPD (chronic obstructive pulmonary disease) ([place])   . Depression   . Dyspepsia   . GERD (gastroesophageal reflux disease)   . H/O hiatal hernia   . YSAYTKZS(010.9)    "1-2/week" (08/16/2016)  . History of blood transfusion 1963   w/childbirth  . Hyperlipidemia   . Hypertension   . Hypothyroidism   . Shortness of breath 02/12/12   "all the time"  . Tremor    Past  Surgical History:  Procedure Laterality Date  . BACK SURGERY    . CARDIAC CATHETERIZATION  04/19/1995   EF 65-70%  . CATARACT EXTRACTION W/ INTRAOCULAR LENS  IMPLANT, BILATERAL  2012  . FRACTURE SURGERY    . LAPAROSCOPIC CHOLECYSTECTOMY  2010  . LEFT HEART CATHETERIZATION WITH CORONARY ANGIOGRAM N/A 02/13/2012   Procedure: LEFT HEART CATHETERIZATION WITH CORONARY ANGIOGRAM;  Surgeon: Thayer Headings, MD;  Location: [facility] CATH LAB;  Service: Cardiovascular;  Laterality: N/A;  . LUMBAR DISC SURGERY  early 2000's  . TEE WITHOUT CARDIOVERSION N/A 08/22/2016   Procedure: TRANSESOPHAGEAL ECHOCARDIOGRAM (TEE);  Surgeon: Sanda Klein, MD;  Location: [place];  Service: Cardiovascular;  Laterality: N/A;  . TIBIA IM NAIL INSERTION Right 03/30/2014   Procedure: INTRAMEDULLARY (IM) NAIL TIBIAL;  Surgeon: Wylene Simmer, MD;  Location: [place];  Service: Orthopedics;  Laterality: Right;  . US ECHOCARDIOGRAPHY  12/17/2007   EF 55-60%  . VAGINAL HYSTERECTOMY  1980's     A IV Location/Drains/Wounds Patient Lines/Drains/Airways Status   Active Line/Drains/Airways    Name:   Placement date:   Placement time:   Site:   Days:   Peripheral IV 11/03/18 Right Hand   11/03/18    1356    Hand   less than 1   External Urinary Catheter   11/03/18    1201    -  less than 1          Intake/Output Last 24 hours No intake or output data in the 24 hours ending 11/03/18 1605  Labs/Imaging Results for orders placed or performed during the hospital encounter of 11/03/18 (from the past 48 hour(s))  Basic metabolic panel     Status: None   Collection Time: 11/03/18  1:40 PM  Result Value Ref Range   Sodium 142 135 - 145 mmol/L   Potassium 3.8 3.5 - 5.1 mmol/L   Chloride 107 98 - 111 mmol/L   CO2 25 22 - 32 mmol/L   Glucose, Bld 84 70 - 99 mg/dL   BUN 11 8 - 23 mg/dL   Creatinine, Ser 0.91 0.44 - 1.00 mg/dL   Calcium 9.1 8.9 - 10.3 mg/dL   GFR calc non Af Amer >60 >60 mL/min   GFR calc Af Amer >60 >60 mL/min    Anion gap 10 5 - 15    Comment: Performed at [facility], [place] [address], [place], [place] [address]  CBC with Differential     Status: Abnormal   Collection Time: 11/03/18  1:40 PM  Result Value Ref Range   WBC 6.5 4.0 - 10.5 K/uL   RBC 3.73 (L) 3.87 - 5.11 MIL/uL   Hemoglobin 9.0 (L) 12.0 - 15.0 g/dL   HCT 31.8 (L) 36.0 - 46.0 %   MCV 85.3 80.0 - 100.0 fL   MCH 24.1 (L) 26.0 - 34.0 pg   MCHC 28.3 (L) 30.0 - 36.0 g/dL   RDW 17.0 (H) 11.5 - 15.5 %   Platelets 231 150 - 400 K/uL   nRBC 0.0 0.0 - 0.2 %   Neutrophils Relative % 61 %   Neutro Abs 4.0 1.7 - 7.7 K/uL   Lymphocytes Relative 23 %   Lymphs Abs 1.5 0.7 - 4.0 K/uL   Monocytes Relative 9 %   Monocytes Absolute 0.6 0.1 - 1.0 K/uL   Eosinophils Relative 6 %   Eosinophils Absolute 0.4 0.0 - 0.5 K/uL   Basophils Relative 1 %   Basophils Absolute 0.1 0.0 - 0.1 K/uL   Immature Granulocytes 0 %   Abs Immature Granulocytes 0.01 0.00 - 0.07 K/uL    Comment: Performed at [facility], [place] [address], [place], [place] [address]   Ct Hip Right Wo Contrast  Result Date: 11/03/2018 CLINICAL DATA:  The patient suffered a right hip injury when she fell backwards 10/27/2018. Continued pain. Subsequent encounter. EXAM: CT OF THE RIGHT HIP WITHOUT CONTRAST TECHNIQUE: Multidetector CT imaging of the right hip was performed according to the standard protocol. Multiplanar CT image reconstructions were also generated. COMPARISON:  Plain films right hip 10/28/2018. FINDINGS: Bones/Joint/Cartilage The patient has an acute fracture of the right sacrum. No other fracture is identified. The patient has moderate appearing right hip osteoarthritis with joint space narrowing, small subchondral cyst in the femoral head and some osteophytosis present. Ligaments Suboptimally assessed by CT. Muscles and Tendons Intact and normal in appearance. Soft tissues Imaged intrapelvic contents are negative. IMPRESSION: Acute fracture of the right sacral ala is  incompletely imaged. Negative for hip fracture. Moderate right hip osteoarthritis. Electronically Signed   By: Inge Rise M.D.   On: 11/03/2018 13:18    Pending Labs [employer] (From admission, onward)    Start     Ordered   Signed and Corporate treasurer  Tomorrow morning,   R     Signed and  Held   Signed and Held  CBC  Tomorrow morning,   R     Signed and Held          Vitals/Pain Today's Vitals   11/03/18 1400 11/03/18 1430 11/03/18 1500 11/03/18 1511  BP: 125/87 (!) 124/57 123/74   Pulse: 80 82 83   Resp:      Temp:      TempSrc:      SpO2: 100% 93% 100%   PainSc:    4     Isolation Precautions No active isolations  Medications Medications  morphine 4 MG/ML injection 4 mg (4 mg Intramuscular Given 11/03/18 1127)  morphine 4 MG/ML injection 4 mg (4 mg Intravenous Given 11/03/18 1407)    Mobility walks with device     Focused Assessments musculoskeletal assessment   R Recommendations: See Admitting Provider Note  Report given to: Mel, RN 5 [place]  Additional Notes:  Patient on 2L O2 nasal cannula 24/7; reports mechanical fall 1 week ago - seen and discharged after x-rays cleared - increased pain and decreased mobility since - normally ambulates using walker, but reports pain has been so severe that she hasn't been able to get out of bed

## 2018-11-04 DIAGNOSIS — S3210XA Unspecified fracture of sacrum, initial encounter for closed fracture: Secondary | ICD-10-CM

## 2018-11-04 LAB — BASIC METABOLIC PANEL
Anion gap: 9 (ref 5–15)
BUN: 16 mg/dL (ref 8–23)
CO2: 26 mmol/L (ref 22–32)
Calcium: 9.2 mg/dL (ref 8.9–10.3)
Chloride: 107 mmol/L (ref 98–111)
Creatinine, Ser: 1.07 mg/dL — ABNORMAL HIGH (ref 0.44–1.00)
GFR calc Af Amer: 59 mL/min — ABNORMAL LOW (ref >60)
GFR calc non Af Amer: 51 mL/min — ABNORMAL LOW (ref >60)
Glucose, Bld: 85 mg/dL (ref 70–99)
Potassium: 3.7 mmol/L (ref 3.5–5.1)
Sodium: 142 mmol/L (ref 135–145)

## 2018-11-04 LAB — CBC
HCT: 30.3 % — ABNORMAL LOW (ref 36.0–46.0)
Hemoglobin: 8.8 g/dL — ABNORMAL LOW (ref 12.0–15.0)
MCH: 24.4 pg — ABNORMAL LOW (ref 26.0–34.0)
MCHC: 29 g/dL — AB (ref 30.0–36.0)
MCV: 84.2 fL (ref 80.0–100.0)
Platelets: 237 10*3/uL (ref 150–400)
RBC: 3.6 MIL/uL — ABNORMAL LOW (ref 3.87–5.11)
RDW: 17.3 % — ABNORMAL HIGH (ref 11.5–15.5)
WBC: 5.7 10*3/uL (ref 4.0–10.5)
nRBC: 0 % (ref 0.0–0.2)

## 2018-11-04 MED ORDER — ACETAMINOPHEN 325 MG PO TABS
650.0000 mg | ORAL_TABLET | Freq: Four times a day (QID) | ORAL | Status: DC
  Administered 2018-11-04 – 2018-11-05 (×4): 650 mg via ORAL
  Filled 2018-11-04 (×4): qty 2

## 2018-11-04 MED ORDER — ACETAMINOPHEN 650 MG RE SUPP: 650.0000 mg | Freq: Four times a day (QID) | RECTAL | Status: DC

## 2018-11-04 MED ORDER — MINERAL OIL RE ENEM
1.0000 | ENEMA | Freq: Once | RECTAL | Status: DC | PRN
  Filled 2018-11-04: qty 1

## 2018-11-04 NOTE — Progress Notes (Addendum)
Family Medicine Teaching Service Daily Progress Note Intern Pager: [phone]  Patient name: Traci [name] Medical record number: [id number] Date of birth: April 23, 1946 Age: 73 y.o. Gender: female  Primary Care Provider: Raelyn Number, MD Consultants: ortho Code Status: partial DNR (DNI)  Pt Overview and Major Events to Date:  3/16: Sacral ala fracture  Assessment and Plan: Traci [name] is a 73 y.o. female presenting with right sacral alar fracture secondary to a mechanical fall. PMH is significant for HFpEF with severe AI, osteoarthritis, anxiety, essential tremor, COPD with chronic respiratory failure, and hypothyroidism.  Sacral Ala fracture d/t mechanical fall: Patient fell on Wednesday and was given pain medication at the ED on Thursday for pain control because all her imaging did not show any fractures. It did not work, patient continued to be in pain and was unable to ambulate. CT Hip Right showed acute fracture of the sacral ala. Ortho following. No surgical intervention, patient will be given medication for pain control.  No history concerning for worsening of AI, or ACS. No history of CVA or seizure like activity. -PT/OT evelaution -oxycodone 15 mg Q6 prn -tylenol scheduled 650mg  q6 -Social work for SNF placement  Anxiety/Mood Disorder: Patient is continuing her home medication of trazodone and Wellbutrin - cont home medications - will investigate more on Abilify  Hypothyroidism: Patient will continue home medication of levothyroxine - cont home dose of levothyroxine 93mcg  COPD with chronic respiratory failure requiring oxygen: Patient will have PRNs of her home medications of Symbicort, trelegy and albuterol. She will continue home oxygen of 2 L. Ideal O2 saturation 88% and above. -incentive spirometer   FEN/GI: Regular Prophylaxis: SCDs  Disposition: Discharge pending SNF placement  Subjective:  Patient says she is doing well. She does note some  tenderness in her abdomen. She says her leg pain is a lot better as well.   Objective: Temp:  [98 F (36.7 C)-98.2 F (36.8 C)] 98.2 F (36.8 C) (03/17 0511) Pulse Rate:  [78-88] 84 (03/17 0511) Resp:  [14-20] 20 (03/17 0511) BP: (110-125)/(46-87) 110/46 (03/17 0511) SpO2:  [90 %-100 %] 90 % (03/17 0700) Physical Exam: Physical Exam  Constitutional: She is oriented to person, place, and time and well-developed, well-nourished, and in no distress. No distress.  HENT:  Head: Normocephalic and atraumatic.  Cardiovascular: Normal rate, regular rhythm and normal heart sounds.  Pulmonary/Chest: Effort normal. No respiratory distress.  Abdominal: Bowel sounds are normal. She exhibits distension. There is abdominal tenderness.  Musculoskeletal:        General: Tenderness present.  Neurological: She is alert and oriented to person, place, and time.  Skin: She is not diaphoretic.  Psychiatric: Mood, memory, affect and judgment normal.    Laboratory: Recent Labs  Lab 11/03/18 1340 11/04/18 0438  WBC 6.5 5.7  HGB 9.0* 8.8*  HCT 31.8* 30.3*  PLT 231 237   Recent Labs  Lab 11/03/18 1340 11/04/18 0438  NA 142 142  K 3.8 3.7  CL 107 107  CO2 25 26  BUN 11 16  CREATININE 0.91 1.07*  CALCIUM 9.1 9.2  GLUCOSE 84 85     Imaging/Diagnostic Tests: none  Traci [name], Medical Student 11/04/2018, 8:30 AM MD student, Traci [name] Intern pager: [phone], text pages welcome  Resident Attestation  I saw and evaluated the patient, performing the key elements of the service. I personally performed or re-performed the history, physical exam, and medical decision making activities of this service and have verified that  the service and findings are accurately documented in the student's note.I developed the management plan that is described in the medical student's note, and I agree with the content, with my edits above.   Harolyn Rutherford, DO [facility],  PGY-2

## 2018-11-04 NOTE — Progress Notes (Signed)
Nutrition Brief Note  Patient identified on the Malnutrition Screening Tool (MST) Report  Wt Readings from Last 15 Encounters:  11/04/18 77.7 kg  10/28/18 76.2 kg  08/04/18 73.3 kg  07/23/18 76.2 kg  04/25/18 72.1 kg  02/04/18 70.6 kg  09/13/17 67.6 kg  08/19/17 67.6 kg  05/07/17 65.9 kg  02/13/17 69.3 kg  01/17/17 68.9 kg  09/14/16 72.5 kg  08/25/16 72.4 kg  08/01/16 72.4 kg  09/16/14 72.6 kg   Traci [name] is a 73 y.o. female presenting with right sacral alar fracture secondary to a mechanical fall. PMH is significant for HFpEF with severe AI, osteoarthritis, anxiety, essential tremor, COPD with chronic respiratory failure, and hypothyroidism.  Pt admitted with sacral ala fracture 2/2 mechanical fall.   Reviewed I/O's: + 40 ml x 24 hours  Spoke with pt at bedside, who reports her appetite is poor. She reports she generally consumes one per day day (consisting of a meat, starch, and vegetable) and that is "normal" for her. Observed breakfast tray of which pt consumed 100%. She reveals that she ordered a hamburger for lunch, which she is looking forward to.  Pt reports her UBW is around 160#. She reports she last weighed this around 2 years ago. However, this is not consistent with documented wt hx, which reveals wt stability.   Nutrition-Focused physical exam completed. Findings are no fat depletion, no muscle depletion, and no edema.   Discussed importance of good meal and supplement intake to promote healing.  Labs reviewed.   Body mass index is 25.3 kg/m. Patient meets criteria for overweight based on current BMI.   Current diet order is regular, patient is consuming approximately 50-100% of meals at this time. Labs and medications reviewed.   No nutrition interventions warranted at this time. If nutrition issues arise, please consult RD.   Castulo Scarpelli A. Jimmye Norman, RD, LDN, [place] Registered Dietitian II Certified Diabetes Care and Education Specialist Pager:  [phone] After hours Pager: [phone]

## 2018-11-04 NOTE — Progress Notes (Signed)
Occupational Therapy Evaluation Patient Details Name: Traci [name] MRN: [id number] DOB: 1946/08/01 Today's Date: 11/04/2018    History of Present Illness Traci [name] is a 73 y.o. female presenting with right sacral alar fracture secondary to a mechanical fall. PMH is significant for HFpEF with severe AI, osteoarthritis, anxiety, essential tremor, COPD with chronic respiratory failure, and hypothyroidism.   Clinical Impression   PTA pt PLOF Min A in functional transfers. Pt currently has little support in home setting and is aware to receive additional therapy for safe return to home setting. Pt currently requires Min to Mod A bed mobility. Mod A toilet transfers and Max A LB ADLs due to pain and low tolerance. Pt will benfit from continued skilled OT to maximize independence and educated of safety functional transfer for home setting. DC recommendation to SNF. OT will continue to follow acutely.    Follow Up Recommendations  SNF    Equipment Recommendations  3 in 1 bedside commode    Recommendations for Other Services       Precautions / Restrictions Precautions Precautions: None Restrictions Weight Bearing Restrictions: No RLE Weight Bearing: Weight bearing as tolerated LLE Weight Bearing: Weight bearing as tolerated      Mobility Bed Mobility Overal bed mobility: Needs Assistance Bed Mobility: Supine to Sit     Supine to sit: Mod assist;HOB elevated     General bed mobility comments: Min A intial sequence. Mod A to scoot to EOB and to sit upright due to BUE for trunk support and pain.   Transfers Overall transfer level: Needs assistance Equipment used: Rolling walker (2 wheeled) Transfers: Sit to/from Stand Sit to Stand: Min assist         General transfer comment: VCs for sequencing and safety.    Balance Overall balance assessment: Needs assistance Sitting-balance support: Bilateral upper extremity supported;Feet supported Sitting balance-Leahy Scale:  Poor     Standing balance support: Bilateral upper extremity supported Standing balance-Leahy Scale: Poor                             ADL either performed or assessed with clinical judgement   ADL Overall ADL's : Needs assistance/impaired Eating/Feeding: Independent;Sitting   Grooming: Wash/dry hands;Wash/dry face;Oral care;Set up;Sitting               Lower Body Dressing: Maximal assistance;Sitting/lateral leans Lower Body Dressing Details (indicate cue type and reason): Pt reports not wearing under garments and only gowns due to difficulty to reach down. Toilet Transfer: Moderate assistance Toilet Transfer Details (indicate cue type and reason): Simulated toilet transfer from bed to recliner. With light Mod A for safety.         Functional mobility during ADLs: Minimal assistance       Vision         Perception     Praxis      Pertinent Vitals/Pain Pain Assessment: 0-10 Pain Score: 10-Worst pain ever Pain Location: lower back Pain Descriptors / Indicators: Aching;Grimacing Pain Intervention(s): Limited activity within patient's tolerance;Repositioned;Monitored during session     Hand Dominance Right   Extremity/Trunk Assessment Upper Extremity Assessment Upper Extremity Assessment: Generalized weakness   Lower Extremity Assessment Lower Extremity Assessment: Defer to PT evaluation       Communication Communication Communication: No difficulties   Cognition Arousal/Alertness: Awake/alert Behavior During Therapy: WFL for tasks assessed/performed Overall Cognitive Status: Within Functional Limits for tasks assessed  General Comments       Exercises     Shoulder Instructions      Home Living Family/patient expects to be discharged to:: Private residence Living Arrangements: Spouse/significant other Available Help at Discharge: Family;Available 24 hours/day Type of Home: Mobile  home Home Access: Stairs to enter Entrance Stairs-Number of Steps: 2 in the front, 5 in the back Entrance Stairs-Rails: Right;Left;Can reach both(In back. No rails in the front) Home Layout: One level     Bathroom Shower/Tub: Walk-in shower         Home Equipment: Environmental consultant - 4 wheels;Bedside commode;Shower seat;Grab bars - tub/shower   Additional Comments: Pt reports only wearing gown because it was difficult and painful to reach down for LB dressing.      Prior Functioning/Environment Level of Independence: Independent with assistive device(s)        Comments: Used the rollator all the time. Has not driven in 3 years. Husband usually does all the shopping.         OT Problem List: Decreased strength;Decreased activity tolerance;Impaired balance (sitting and/or standing);Decreased safety awareness;Decreased knowledge of use of DME or AE;Decreased knowledge of precautions;Pain      OT Treatment/Interventions: Self-care/ADL training;Therapeutic exercise;DME and/or AE instruction;Therapeutic activities;Patient/family education;Balance training    OT Goals(Current goals can be found in the care plan section) Acute Rehab OT Goals Patient Stated Goal: to become stronger. OT Goal Formulation: With patient Time For Goal Achievement: 11/04/18 Potential to Achieve Goals: Good  OT Frequency: Min 2X/week   Barriers to D/C: Inaccessible home environment;Decreased caregiver support          Co-evaluation PT/OT/SLP Co-Evaluation/Treatment: Yes Reason for Co-Treatment: Complexity of the patient's impairments (multi-system involvement);For patient/therapist safety   OT goals addressed during session: ADL's and self-care;Strengthening/ROM      AM-PAC OT "6 Clicks" Daily Activity     Outcome Measure Help from another person eating meals?: None Help from another person taking care of personal grooming?: A Little Help from another person toileting, which includes using toliet, bedpan,  or urinal?: A Little Help from another person bathing (including washing, rinsing, drying)?: A Little Help from another person to put on and taking off regular upper body clothing?: A Little Help from another person to put on and taking off regular lower body clothing?: A Lot 6 Click Score: 18   End of Session Equipment Utilized During Treatment: Gait belt;Rolling walker;Oxygen(1L, O2 reclined 91%, sitting upright 95%) Nurse Communication: Mobility status;Weight bearing status  Activity Tolerance: Patient limited by pain Patient left: in chair;with call bell/phone within reach;with chair alarm set  OT Visit Diagnosis: Unsteadiness on feet (R26.81);Muscle weakness (generalized) (M62.81);Pain Pain - part of body: (lower back)                Time: 1351-1416 OT Time Calculation (min): 25 min Charges:  OT General Charges $OT Visit: 1 Visit OT Evaluation $OT Eval Low Complexity: 1 Low OT Treatments $Self Care/Home Management : 8-22 mins  Minus Breeding, MSOT, OTR/L  Supplemental Rehabilitation Services  [phone]  Marius Ditch 11/04/2018, 3:02 PM

## 2018-11-04 NOTE — Evaluation (Signed)
Physical Therapy Evaluation Patient Details Name: Traci [name] MRN: [id number] DOB: 19-Jan-1946 Today's Date: 11/04/2018   History of Present Illness  Traci [name] is a 73 y.o. female presenting with right sacral alar fracture secondary to a mechanical fall. PMH is significant for HFpEF with severe AI, osteoarthritis, anxiety, essential tremor, COPD with chronic respiratory failure, and hypothyroidism.  Clinical Impression   Pt admitted with above diagnosis. Pt currently with functional limitations due to the deficits listed below (see PT Problem List). Walks with Rollator RW at baseline; Presents with pain limiting mobility and transfers;  Her husband recently had heart surgery, and is unable to provide assist to her at home; at this point, recommend SNF for post-acute rehab to maximize independence and safety with mobility before getting back home; Pt will benefit from skilled PT to increase their independence and safety with mobility to allow discharge to the venue listed below.       Follow Up Recommendations SNF    Equipment Recommendations  Rolling walker with 5" wheels;3in1 (PT)    Recommendations for Other Services       Precautions / Restrictions Precautions Precautions: Fall Restrictions Weight Bearing Restrictions: No RLE Weight Bearing: Weight bearing as tolerated LLE Weight Bearing: Weight bearing as tolerated      Mobility  Bed Mobility Overal bed mobility: Needs Assistance Bed Mobility: Supine to Sit     Supine to sit: Mod assist;HOB elevated     General bed mobility comments: Min A intial sequence. Mod A to scoot to EOB and to sit upright due to BUE for trunk support and pain. Painful at EOB, and tended to lay back down to her L while initially sitting EOB  Transfers Overall transfer level: Needs assistance Equipment used: Rolling walker (2 wheeled) Transfers: Sit to/from Stand Sit to Stand: Min assist         General transfer comment: VCs for  sequencing and safety.  Min assist to steady RW because she tended to pull up on it  Ambulation/Gait Ambulation/Gait assistance: Min assist;+2 safety/equipment Gait Distance (Feet): 3 Feet Assistive device: Rolling walker (2 wheeled)       General Gait Details: Short steps, heavy dependence on RW sue to pain  Stairs            Wheelchair Mobility    Modified Rankin (Stroke Patients Only)       Balance Overall balance assessment: Needs assistance Sitting-balance support: Bilateral upper extremity supported;Feet supported Sitting balance-Leahy Scale: Poor     Standing balance support: Bilateral upper extremity supported Standing balance-Leahy Scale: Poor                               Pertinent Vitals/Pain Pain Assessment: 0-10 Pain Score: 10-Worst pain ever Pain Location: lower back Pain Descriptors / Indicators: Aching;Grimacing Pain Intervention(s): Limited activity within patient's tolerance    Home Living Family/patient expects to be discharged to:: Private residence Living Arrangements: Spouse/significant other Available Help at Discharge: Family;Available 24 hours/day Type of Home: Mobile home Home Access: Stairs to enter Entrance Stairs-Rails: Right;Left;Can reach both(In back. No rails in the front) Entrance Stairs-Number of Steps: 2 in the front, 5 in the back Home Layout: One level Home Equipment: Walker - 4 wheels;Bedside commode;Shower seat;Grab bars - tub/shower Additional Comments: Pt reports only wearing gown because it was difficult and painful to reach down for LB dressing.    Prior Function Level of Independence: Independent with assistive  device(s)         Comments: Used the rollator all the time. Has not driven in 3 years. Husband usually does all the shopping.      Hand Dominance   Dominant Hand: Right    Extremity/Trunk Assessment   Upper Extremity Assessment Upper Extremity Assessment: Defer to OT evaluation     Lower Extremity Assessment Lower Extremity Assessment: Generalized weakness(Pain with most movement LEs)       Communication   Communication: No difficulties  Cognition Arousal/Alertness: Awake/alert Behavior During Therapy: WFL for tasks assessed/performed Overall Cognitive Status: Within Functional Limits for tasks assessed                                        General Comments General comments (skin integrity, edema, etc.): Session conducted on supplemental O2, 2L    Exercises     Assessment/Plan    PT Assessment Patient needs continued PT services  PT Problem List Decreased strength;Decreased range of motion;Decreased activity tolerance;Decreased balance;Decreased mobility;Decreased knowledge of use of DME;Decreased knowledge of precautions;Pain       PT Treatment Interventions DME instruction;Gait training;Functional mobility training;Therapeutic activities;Therapeutic exercise;Balance training;Cognitive remediation;Patient/family education    PT Goals (Current goals can be found in the Care Plan section)  Acute Rehab PT Goals Patient Stated Goal: to become stronger. PT Goal Formulation: With patient Time For Goal Achievement: 11/18/18 Potential to Achieve Goals: Good    Frequency Min 2X/week   Barriers to discharge        Co-evaluation PT/OT/SLP Co-Evaluation/Treatment: Yes Reason for Co-Treatment: Complexity of the patient's impairments (multi-system involvement) PT goals addressed during session: Mobility/safety with mobility OT goals addressed during session: ADL's and self-care;Strengthening/ROM       AM-PAC PT "6 Clicks" Mobility  Outcome Measure Help needed turning from your back to your side while in a flat bed without using bedrails?: A Lot Help needed moving from lying on your back to sitting on the side of a flat bed without using bedrails?: A Lot Help needed moving to and from a bed to a chair (including a wheelchair)?: A  Lot Help needed standing up from a chair using your arms (e.g., wheelchair or bedside chair)?: A Little Help needed to walk in hospital room?: A Little Help needed climbing 3-5 steps with a railing? : A Lot 6 Click Score: 14    End of Session Equipment Utilized During Treatment: Gait belt Activity Tolerance: Patient tolerated treatment well Patient left: in chair;with call bell/phone within reach;with chair alarm set Nurse Communication: Mobility status PT Visit Diagnosis: Pain;Other abnormalities of gait and mobility (R26.89) Pain - Right/Left: Right Pain - part of body: (sacrum)    Time: 1353-1416 PT Time Calculation (min) (ACUTE ONLY): 23 min   Charges:   PT Evaluation $PT Eval Moderate Complexity: [place], [place]  Acute Rehabilitation Services Pager [phone] Office [place] 11/04/2018, 3:31 PM

## 2018-11-04 NOTE — Progress Notes (Signed)
Bladder scan done: 900 mls resulted.  An in and out cath was attempted by 3  Medical persons with no success.  The patient was assisted up to the bedside commode and the patient urinated 400 mls.  She reports that she feel relief.

## 2018-11-05 ENCOUNTER — Other Ambulatory Visit: Payer: Self-pay

## 2018-11-05 LAB — HEPATIC FUNCTION PANEL
ALT: 17 U/L (ref 0–44)
AST: 22 U/L (ref 15–41)
Albumin: 3 g/dL — ABNORMAL LOW (ref 3.5–5.0)
Alkaline Phosphatase: 94 U/L (ref 38–126)
Bilirubin, Direct: 0.2 mg/dL (ref 0.0–0.2)
Indirect Bilirubin: 0.6 mg/dL (ref 0.3–0.9)
Total Bilirubin: 0.8 mg/dL (ref 0.3–1.2)
Total Protein: 6 g/dL — ABNORMAL LOW (ref 6.5–8.1)

## 2018-11-05 LAB — BASIC METABOLIC PANEL
Anion gap: 7 (ref 5–15)
BUN: 18 mg/dL (ref 8–23)
CHLORIDE: 107 mmol/L (ref 98–111)
CO2: 27 mmol/L (ref 22–32)
Calcium: 9.2 mg/dL (ref 8.9–10.3)
Creatinine, Ser: 1.06 mg/dL — ABNORMAL HIGH (ref 0.44–1.00)
GFR calc Af Amer: 60 mL/min — ABNORMAL LOW (ref >60)
GFR calc non Af Amer: 52 mL/min — ABNORMAL LOW (ref >60)
Glucose, Bld: 113 mg/dL — ABNORMAL HIGH (ref 70–99)
Potassium: 4.2 mmol/L (ref 3.5–5.1)
Sodium: 141 mmol/L (ref 135–145)

## 2018-11-05 LAB — CBC
HEMATOCRIT: 28.7 % — AB (ref 36.0–46.0)
Hemoglobin: 8.4 g/dL — ABNORMAL LOW (ref 12.0–15.0)
MCH: 24.6 pg — ABNORMAL LOW (ref 26.0–34.0)
MCHC: 29.3 g/dL — ABNORMAL LOW (ref 30.0–36.0)
MCV: 83.9 fL (ref 80.0–100.0)
NRBC: 0 % (ref 0.0–0.2)
Platelets: 216 10*3/uL (ref 150–400)
RBC: 3.42 MIL/uL — ABNORMAL LOW (ref 3.87–5.11)
RDW: 17.1 % — ABNORMAL HIGH (ref 11.5–15.5)
WBC: 5.5 10*3/uL (ref 4.0–10.5)

## 2018-11-05 MED ORDER — ENSURE ENLIVE PO LIQD
237.0000 mL | Freq: Two times a day (BID) | ORAL | Status: DC
  Administered 2018-11-05: 237 mL via ORAL

## 2018-11-05 MED ORDER — ACETAMINOPHEN 650 MG RE SUPP: 650.0000 mg | Freq: Four times a day (QID) | RECTAL | Status: DC

## 2018-11-05 MED ORDER — HYDROXYZINE HCL 25 MG PO TABS
25.0000 mg | ORAL_TABLET | Freq: Three times a day (TID) | ORAL | Status: DC | PRN
  Administered 2018-11-05: 25 mg via ORAL
  Filled 2018-11-05: qty 1

## 2018-11-05 MED ORDER — ACETAMINOPHEN 500 MG PO TABS
1000.0000 mg | ORAL_TABLET | Freq: Four times a day (QID) | ORAL | Status: DC
  Administered 2018-11-05 – 2018-11-06 (×5): 1000 mg via ORAL
  Filled 2018-11-05 (×4): qty 2

## 2018-11-05 MED ORDER — OXYCODONE HCL 5 MG PO TABS
15.0000 mg | ORAL_TABLET | Freq: Four times a day (QID) | ORAL | Status: DC
  Administered 2018-11-05 – 2018-11-06 (×5): 15 mg via ORAL
  Filled 2018-11-05 (×5): qty 3

## 2018-11-05 MED ORDER — TAMSULOSIN HCL 0.4 MG PO CAPS
0.4000 mg | ORAL_CAPSULE | Freq: Every day | ORAL | Status: DC
  Administered 2018-11-05 – 2018-11-06 (×2): 0.4 mg via ORAL
  Filled 2018-11-05 (×2): qty 1

## 2018-11-05 NOTE — NC FL2 (Signed)
[place] LEVEL OF CARE SCREENING TOOL     IDENTIFICATION  Patient Name: Traci [name] Birthdate: 18-Jul-1946 Sex: female Admission Date (Current Location): 11/03/2018  [facility] and [place] Number:  [employer] and Address:  [facility] [place]. [facility], [place] [address] [place], [place] [address]      Provider Number: [id number]  Attending Physician Name and Address:  Leeanne Rio, MD  Relative Name and Phone Number:  Marisa Severin (daughter) [phone]    Current Level of Care: Hospital Recommended Level of Care: [place] Prior Approval Number:    Date Approved/Denied:   PASRR Number: [id number]  Discharge Plan: SNF    Current Diagnoses: Patient Active Problem List   Diagnosis Date Noted  . Sacral fracture, closed ([place])   . Hip fracture ([place]) 11/03/2018  . AKI (acute kidney injury) ([place]) 07/23/2018  . Fall 07/23/2018  . Abdominal pain   . Contusion of left chest wall   . Severe aortic insufficiency 02/04/2018  . Tremor   . Hypertension   . Hyperlipidemia   . H/O hiatal hernia   . GERD (gastroesophageal reflux disease)   . Dyspepsia   . Depression   . COPD (chronic obstructive pulmonary disease) ([place])   . Chronic lower back pain   . Chronic bronchitis ([place])   . Arthritis   . Anxiety   . Anginal pain ([place])   . DNR (do not resuscitate) 02/13/2017  . Shortness of breath 09/14/2016  . 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 Acute on chronic diastolic CHF (congestive heart failure), NYHA class 3 ([place])   . CHF (congestive heart failure) ([place]) 08/16/2016  . Anemia 08/16/2016  . Chronic back pain 08/16/2016  . Hypothyroidism 08/16/2016  . Benign essential tremor 08/16/2016  . Acute respiratory failure with hypoxia ([place]) 04/01/2014  . Sinus tachycardia 03/31/2014  . Fractured tibia and fibula 03/26/2014  . Right tibial fracture 03/26/2014  . Closed right tibial fracture 03/26/2014  . Unstable  gait 03/26/2014  . Chest pain syndrome 02/12/2012  . Spinal stenosis 09/24/2011  . HEARTBURN 08/03/2010  . BENIGN NEOPLASM OF ADRENAL GLAND 07/28/2010  . Anxiety disorder 07/28/2010  . MYOCARDIAL INFARCTION, HX OF 07/28/2010  . Chronic obstructive pulmonary disease ([place]) 07/28/2010  . DIVERTICULOSIS, COLON 07/28/2010  . COLONIC POLYPS, ADENOMATOUS, HX OF 07/28/2010  . HLD (hyperlipidemia) 06/23/2010  . HTN (hypertension) 06/23/2010  . ACUTE DILATATION OF STOMACH 06/23/2010  . ABDOMINAL PAIN, GENERALIZED 06/23/2010  . History of blood transfusion 08/20/1961    Orientation RESPIRATION BLADDER Height & Weight     Self, Time, Situation, Place  O2(nasal cannual 2L/min) Continent, External catheter Weight: 171 lb 4.8 oz (77.7 kg) Height:  5\' 9"  (175.3 cm)  BEHAVIORAL SYMPTOMS/MOOD NEUROLOGICAL BOWEL NUTRITION STATUS      Continent Diet(see discharge summary)  AMBULATORY STATUS COMMUNICATION OF NEEDS Skin   Limited Assist Verbally Skin abrasions, Other (Comment)(right knee and left toe abrasions, ecchymosis on hands and arms)                       Personal Care Assistance Level of Assistance  Bathing, Feeding, Dressing, Total care Bathing Assistance: Limited assistance Feeding assistance: Independent Dressing Assistance: Limited assistance Total Care Assistance: Limited assistance   Functional Limitations Info  Sight, Hearing, Speech Sight Info: Impaired Hearing Info: Adequate Speech Info: Adequate    SPECIAL CARE FACTORS FREQUENCY  PT (By licensed PT), OT (By licensed OT)  PT Frequency: min 5x weekly OT Frequency: min 5x weekly            Contractures Contractures Info: Not present    Additional Factors Info  Code Status, Allergies Code Status Info: Partial Allergies Info: Cephalexin, Cymbalta (duloxetine Hcl), Latex           Current Medications (11/05/2018):  This is the current hospital active medication list Current Facility-Administered  Medications  Medication Dose Route Frequency Provider Last Rate Last Dose  . 0.9 %  sodium chloride infusion  250 mL Intravenous PRN [place] Bing, DO      . acetaminophen (TYLENOL) tablet 1,000 mg  1,000 mg Oral Q6H Matilde Haymaker, MD       Or  . acetaminophen (TYLENOL) suppository 650 mg  650 mg Rectal Q6H Matilde Haymaker, MD      . albuterol (PROVENTIL) (2.5 MG/3ML) 0.083% nebulizer solution 3 mL  3 mL Inhalation Q6H PRN [place] Bing, DO      . buPROPion (WELLBUTRIN XL) 24 hr tablet 450 mg  450 mg Oral Daily [place] Bing, DO   450 mg at 11/05/18 1021  . cholecalciferol (VITAMIN D3) tablet 2,000 Units  2,000 Units Oral Daily [place] Bing, DO   2,000 Units at 11/05/18 1020  . feeding supplement (ENSURE ENLIVE) (ENSURE ENLIVE) liquid 237 mL  237 mL Oral BID BM Leeanne Rio, MD      . fluticasone furoate-vilanterol (BREO ELLIPTA) 200-25 MCG/INH 1 puff  1 puff Inhalation Daily Leeanne Rio, MD   1 puff at 11/05/18 [phone]  . folic acid (FOLVITE) tablet 1 mg  1 mg Oral Daily [place] Bing, DO   1 mg at 11/05/18 1020  . hydrOXYzine (ATARAX/VISTARIL) tablet 25 mg  25 mg Oral TID PRN Milus Banister C, DO   25 mg at 11/05/18 0012  . levothyroxine (SYNTHROID, LEVOTHROID) tablet 25 mcg  25 mcg Oral Q0600 [place] Bing, DO   25 mcg at 11/05/18 9528  . MEDLINE mouth rinse  15 mL Mouth Rinse BID Leeanne Rio, MD   15 mL at 11/04/18 2218  . mineral oil enema 1 enema  1 enema Rectal Once PRN Rory Percy, DO      . multivitamin with minerals tablet 1 tablet  1 tablet Oral Daily [place] Bing, DO   1 tablet at 11/05/18 1021  . oxyCODONE (Oxy IR/ROXICODONE) immediate release tablet 15 mg  15 mg Oral Q6H PRN [place] Bing, DO   15 mg at 11/05/18 1019  . polyethylene glycol (MIRALAX / GLYCOLAX) packet 17 g  17 g Oral Daily [place] Bing, DO   17 g at 11/05/18 1021  . potassium chloride SA (K-DUR,KLOR-CON) CR tablet 20 mEq  20 mEq Oral Daily [place] Bing, DO   20 mEq at 11/05/18 1019  . senna-docusate (Senokot-S) tablet 1 tablet  1 tablet Oral QHS [place] Bing, DO   1 tablet at 11/03/18 2249  . sodium chloride flush (NS) 0.9 % injection 3 mL  3 mL Intravenous Q12H [place] Bing, DO   3 mL at 11/04/18 2217  . sodium chloride flush (NS) 0.9 % injection 3 mL  3 mL Intravenous PRN [place] Bing, DO      . traZODone (DESYREL) tablet 300 mg  300 mg Oral QHS PRN [place] Bing, DO   300 mg at 11/05/18 0012  . umeclidinium bromide (INCRUSE ELLIPTA) 62.5 MCG/INH 1 puff  1 puff  Inhalation Daily Leeanne Rio, MD   1 puff at 11/05/18 [phone]  . vitamin B-12 (CYANOCOBALAMIN) tablet 1,000 mcg  1,000 mcg Oral Daily [place] Bing, DO   1,000 mcg at 11/05/18 1021     Discharge Medications: Please see discharge summary for a list of discharge medications.  Relevant Imaging Results:  Relevant Lab Results:   Additional Information SSN: [id number]  Alberteen Sam, LCSW

## 2018-11-05 NOTE — Progress Notes (Signed)
The patient urinated 425 mls- post voided residual results per bladder scan 300.  The patient reports feeling comfortable.  The patient becomes very anxious at times and has difficulty relaxing.  The patient was medicated for pain as ordered (right hip pain)

## 2018-11-05 NOTE — Discharge Summary (Signed)
[facility] Discharge Summary  Patient name: Traci [name] Medical record number: [id number] Date of birth: [birth year] Age: 73 y.o. Gender: female Date of Admission: 11/03/2018  Date of Discharge: 11/05/2018 Admitting Physician: Leeanne Rio, MD  Primary Care Provider: Raelyn Number, MD Consultants: none  Indication for Hospitalization: Pain control secondary to sacral ala fracture  Discharge Diagnoses/Problem List:  Sacral ala fracture Hypertension Anxiety Mood disorder Hypothyroidism COPD  Disposition: Discharged to SNF  Discharge Condition: Stable  Discharge Exam:  General: Alert and cooperative and appears to be in no acute distress.  Demonstrated significant pain will move around input. HEENT: Neck non-tender without lymphadenopathy Cardio: Normal S1 and S2, no S3 or S4. Rhythm is regular.  3/6 systolic murmur.   Pulm: Clear to auscultation bilaterally, no crackles, wheezing, or diminished breath sounds. Normal respiratory effort Abdomen: Bowel sounds normal. Abdomen soft and non-tender.  Extremities: No peripheral edema. Warm/ well perfused.  Strong radial pulse. Neuro: Cranial nerves grossly intact  Brief Hospital Course:  Traci [name] is a 73 year old woman who presented to the ED on 3/16 with severe pain in her hips after a fall.  Roughly 1 week prior to presentation, she experienced a significant fall and was seen in the ED.  At that time hip fracture was ruled out and she was sent home.  Her pain progressed and she re-presented to the ED on 3/18.  On 3/18, CT revealed a sacral ala fracture.  She was admitted for pain control and SNF placement.  During her hospitalization, she was left on her home regimen of oxycodone 15 every 6 hours.  Her pain was moderately controlled on this regimen.  She is medically stable throughout her hospitalization she did experience mild issues with urinary retention which were ultimately resolved without  catheterization.  On 3/19, she is found to be medically stable and a bed was made available for her in a skilled nursing facility.  Urinary retention She did experience urinary retention during the hospitalization and was started on flomax 0.4 mg daily.  Hydroxyzine was discontinued due to its common anticholinergic side effects.  Pain control On admission, she had been taking oxycodone 15 mg every 6 hours at home.  This was continued on admission and later scheduled because she would not ask for the medication and then note to the doctors that her pain was poorly controlled.  We recommend continuing this medication as scheduled for the next 3 days before putting it on a as needed basis.   Issues for Follow Up:  1. Pain control currently consist of oxycodone 15 mg every 6 hours.  Continue to assess pain medication and continue decreasing her opioid use. 2. Continue to assess need for physical therapy for mobility and pain control.  Significant Procedures: None  Significant Labs and Imaging:  Recent Labs  Lab 11/03/18 1340 11/04/18 0438 11/05/18 0324  WBC 6.5 5.7 5.5  HGB 9.0* 8.8* 8.4*  HCT 31.8* 30.3* 28.7*  PLT 231 237 216   Recent Labs  Lab 11/03/18 1340 11/04/18 0438 11/05/18 0324  NA 142 142 141  K 3.8 3.7 4.2  CL 107 107 107  CO2 25 26 27   GLUCOSE 84 85 113*  BUN 11 16 18   CREATININE 0.91 1.07* 1.06*  CALCIUM 9.1 9.2 9.2  ALKPHOS  --   --  94  AST  --   --  22  ALT  --   --  17  ALBUMIN  --   --  3.0*      Results/Tests Pending at Time of Discharge: None  Discharge Medications:  Allergies as of 11/06/2018      Reactions   Cephalexin Hives   Cymbalta [duloxetine Hcl] Other (See Comments)   Sees spiders   Latex    " MAKES ME ITCHY "       Medication List    STOP taking these medications   hydrOXYzine 25 MG capsule Commonly known as:  VISTARIL     TAKE these medications   acetaminophen 500 MG tablet Commonly known as:  TYLENOL Take 1 tablet (500  mg total) by mouth every 4 (four) hours as needed for mild pain.   albuterol 108 (90 Base) MCG/ACT inhaler Commonly known as:  PROVENTIL HFA;VENTOLIN HFA Inhale 2 puffs into the lungs every 6 (six) hours as needed for wheezing or shortness of breath.   buPROPion 150 MG 24 hr tablet Commonly known as:  WELLBUTRIN XL Take 450 mg by mouth daily.   Fluticasone-Umeclidin-Vilant 100-62.5-25 MCG/INH Aepb Commonly known as:  Trelegy Ellipta Inhale 1 puff into the lungs daily.   folic acid 1 MG tablet Commonly known as:  FOLVITE Take 1 tablet (1 mg total) by mouth daily.   levothyroxine 25 MCG tablet Commonly known as:  SYNTHROID, LEVOTHROID Take 25 mcg by mouth daily before breakfast.   omeprazole 20 MG capsule Commonly known as:  PRILOSEC Take 20 mg by mouth daily.   oxyCODONE 15 MG immediate release tablet Commonly known as:  ROXICODONE Take 15 mg by mouth every 6 (six) hours as needed for pain.   OXYGEN Inhale 2 L into the lungs as needed.   potassium chloride SA 20 MEQ tablet Commonly known as:  K-DUR,KLOR-CON Take 1 tablet (20 mEq total) by mouth daily.   Symbicort 160-4.5 MCG/ACT inhaler Generic drug:  budesonide-formoterol TAKE 2 PUFFS BY MOUTH TWICE A DAY   tamsulosin 0.4 MG Caps capsule Commonly known as:  FLOMAX Take 1 capsule (0.4 mg total) by mouth daily. Start taking on:  November 07, 2018   traZODone 150 MG tablet Commonly known as:  DESYREL Take 300 mg by mouth at bedtime as needed for sleep.   vitamin B-12 1000 MCG tablet Commonly known as:  CYANOCOBALAMIN Take 1,000 mcg by mouth daily.   Vitamin D3 50 MCG (2000 UT) Tabs Take 2,000 Units by mouth daily.       Discharge Instructions: Please refer to Patient Instructions section of EMR for full details.  Patient was counseled important signs and symptoms that should prompt return to medical care, changes in medications, dietary instructions, activity restrictions, and follow up appointments.    Follow-Up Appointments: Contact information for after-discharge care    Destination    [facility] Preferred SNF .   Service:  Skilled Nursing Contact information: [address] [place]              Matilde Haymaker, MD 11/06/2018, 12:15 PM PGY-1, [place]

## 2018-11-05 NOTE — TOC Initial Note (Signed)
Transition of Care [facility]) - Initial/Assessment Note    Patient Details  Name: Traci [name] MRN: [id number] Date of Birth: December 10, 1945  Transition [facility]) CM/SW Contact:    Alberteen Sam, LCSW Phone Number: 11/05/2018, 3:31 PM  Clinical Narrative:                  CSW consulted with patient regarding SNF placement, patient in agreement of PT recommendation for SNF however reports her daughter Marisa Severin is choosing facility for her. CSW was given permission to contact [facility] for SNF choice. CSW phoned Marisa Severin who reports she chooses [facility] in [place], [place] will send referral.  [facility] accepted and [place] notified, plan to dc today if DC summary becomes available shortly.   Expected Discharge Plan: [place] Barriers to Discharge: No Barriers Identified   Patient Goals and CMS Choice Patient states their goals for this hospitalization and ongoing recovery are:: patient wants to go home after [facility].gov Compare Post Acute Care list provided to:: Patient Represenative (must comment)(daughter Marisa Severin) Choice offered to / list presented to : Adult Children  Expected Discharge Plan and Services Expected Discharge Plan: [place] Discharge Planning Services: NA Post Acute Care Choice: [place] Living arrangements for the past 2 months: Single Family Home                 DME Arranged: N/A DME Agency: NA HH Arranged: NA HH Agency: NA  Prior Living Arrangements/Services Living arrangements for the past 2 months: [place] Lives with:: Self Patient language and need for interpreter reviewed:: Yes Do you feel safe going back to the place where you live?: No   patient not independently mobile, would need SNF before safe discharge home  Need for Family Participation in Patient Care: Yes (Comment) Care giver support system in place?: Yes (comment)   Criminal Activity/Legal Involvement Pertinent to  Current Situation/Hospitalization: No - Comment as needed  Activities of Daily Living Home Assistive Devices/Equipment: Contact lenses, Wheelchair, Bedside commode/3-in-1, Eyeglasses, Walker (specify type) ADL Screening (condition at time of admission) Patient's cognitive ability adequate to safely complete daily activities?: Yes Is the patient deaf or have difficulty hearing?: Yes Does the patient have difficulty seeing, even when wearing glasses/contacts?: No Does the patient have difficulty concentrating, remembering, or making decisions?: No Patient able to express need for assistance with ADLs?: Yes Does the patient have difficulty dressing or bathing?: No Independently performs ADLs?: Yes (appropriate for developmental age) Does the patient have difficulty walking or climbing stairs?: Yes Weakness of Legs: Both Weakness of Arms/Hands: Both  Permission Sought/Granted Permission sought to share information with : Case Manager, Customer service manager, Family Supports Permission granted to share information with : Yes, Verbal Permission Granted  Share Information with NAME: Marisa Severin  Permission granted to share info w AGENCY: SNFs  Permission granted to share info w Relationship: daughter  Permission granted to share info w Contact Information: [phone]  Emotional Assessment Appearance:: Appears stated age Attitude/Demeanor/Rapport: Gracious Affect (typically observed): Accepting Orientation: : Oriented to Self, Oriented to Place, Oriented to  Time, Oriented to Situation Alcohol / Substance Use: Not Applicable Psych Involvement: No (comment)  Admission diagnosis:  Right hip pain [M25.551] Closed fracture of sacrum, unspecified portion of sacrum, initial encounter [facility]) [S32.10XA] Patient Active Problem List   Diagnosis Date Noted  . Sacral fracture, closed ([place])   . Hip fracture ([place]) 11/03/2018  . AKI (acute kidney injury) ([place]) 07/23/2018  .  Fall 07/23/2018  .  Abdominal pain   . Contusion of left chest wall   . Severe aortic insufficiency 02/04/2018  . Tremor   . Hypertension   . Hyperlipidemia   . H/O hiatal hernia   . GERD (gastroesophageal reflux disease)   . Dyspepsia   . Depression   . COPD (chronic obstructive pulmonary disease) ([place])   . Chronic lower back pain   . Chronic bronchitis ([place])   . Arthritis   . Anxiety   . Anginal pain ([place])   . DNR (do not resuscitate) 02/13/2017  . Shortness of breath 09/14/2016  . 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 Acute on chronic diastolic CHF (congestive heart failure), NYHA class 3 ([place])   . CHF (congestive heart failure) ([place]) 08/16/2016  . Anemia 08/16/2016  . Chronic back pain 08/16/2016  . Hypothyroidism 08/16/2016  . Benign essential tremor 08/16/2016  . Acute respiratory failure with hypoxia ([place]) 04/01/2014  . Sinus tachycardia 03/31/2014  . Fractured tibia and fibula 03/26/2014  . Right tibial fracture 03/26/2014  . Closed right tibial fracture 03/26/2014  . Unstable gait 03/26/2014  . Chest pain syndrome 02/12/2012  . Spinal stenosis 09/24/2011  . HEARTBURN 08/03/2010  . BENIGN NEOPLASM OF ADRENAL GLAND 07/28/2010  . Anxiety disorder 07/28/2010  . MYOCARDIAL INFARCTION, HX OF 07/28/2010  . Chronic obstructive pulmonary disease ([place]) 07/28/2010  . DIVERTICULOSIS, COLON 07/28/2010  . COLONIC POLYPS, ADENOMATOUS, HX OF 07/28/2010  . HLD (hyperlipidemia) 06/23/2010  . HTN (hypertension) 06/23/2010  . ACUTE DILATATION OF STOMACH 06/23/2010  . ABDOMINAL PAIN, GENERALIZED 06/23/2010  . History of blood transfusion 08/20/1961   PCP:  Raelyn Number, MD Pharmacy:   [place], [place] [address] Phone: [phone] Fax: [phone]     Social Determinants of Health (SDOH) Interventions    Readmission Risk Interventions 30 Day Unplanned Readmission Risk Score     ED to Hosp-Admission  (Current) from 11/03/2018 in [place]  30 Day Unplanned Readmission Risk Score (%)  16 Filed at 11/05/2018 1200     This score is the patient's risk of an unplanned readmission within 30 days of being discharged (0 -100%). The score is based on dignosis, age, lab data, medications, orders, and past utilization.   Low:  0-14.9   Medium: 15-21.9   High: 22-29.9   Extreme: 30 and above       No flowsheet data found.

## 2018-11-05 NOTE — Progress Notes (Signed)
CSW consulted with patient for SNF placement who reports her daughter Traci [name] is finding facility for her and to contact her. CSW attempted to contact Letha on both phone numbers listed and patient's other daughter Mickel Baas. No answer and vm not avail on Letha's phone, CSW lvm with Mickel Baas to please contact CSW regarding SNF placement.   [place], [place]

## 2018-11-05 NOTE — Progress Notes (Signed)
Family Medicine Teaching Service Daily Progress Note Intern Pager: [phone]  Patient name: Traci [name] Medical record number: [id number] Date of birth: Jun 01, 1946 Age: 73 y.o. Gender: female  Primary Care Provider: Raelyn Number, MD Consultants: ortho Code Status: partial DNR (DNI)  Pt Overview and Major Events to Date:  3/16: Sacral ala fracture  Assessment and Plan: Traci [name] is a 73 y.o. female presenting with right sacral alar fracture secondary to a mechanical fall. PMH is significant for HFpEF with severe AI, osteoarthritis, anxiety, essential tremor, COPD with chronic respiratory failure, and hypothyroidism.  Sacral Ala fracture d/t mechanical fall: Continue with pain control.  Physical therapy and Occupational Therapy both recommended SNF placement.  Awaiting SNF placement. -tylenol scheduled 650mg  q6 -oxycodone 15 mg Q6 prn -Social work for SNF placement  Hypotension Volatile diastolic blood pressures noted in the past 24 hours.  Diastolics ranging from 14-782.  She is not currently receiving any blood pressure control medication. -Cautious use of opioids  Anxiety/Mood Disorder: Not currently receiving Abilify. -Bupropion 450 mg daily -Hydroxyzine 25 mg 3 times daily as needed -Trazodone for sleep  Hypothyroidism: -levothyroxine 75mcg  COPD with chronic respiratory failure requiring oxygen: -Incruse Ellipta daily -Breo Ellipta daily -Albuterol nebulizer every 6 hours as needed -Continue home oxygen    FEN/GI: Regular Prophylaxis: SCDs  Disposition:  Possible discharge to [facility] 3/18  Subjective:  No acute events overnight.  She continues to complain of significant left hip/leg pain this morning.  She demonstrated significant pain when moving around in bed.  She reports that she has not had significant improvement in her pain for this hospital admission.  Objective: Temp:  [97.8 F (36.6 C)-98.7 F (37.1 C)] 98.3 F (36.8 C) (03/18  0447) Pulse Rate:  [82-85] 85 (03/18 0447) Resp:  [14-18] 18 (03/18 0447) BP: (115-125)/(41-113) 118/58 (03/18 0447) SpO2:  [90 %-100 %] 96 % (03/18 0447) Weight:  [77.7 kg] 77.7 kg (03/17 0939)  Physical Exam: General: Alert and cooperative and appears to be in no acute distress.  Demonstrated significant pain will move around input. HEENT: Neck non-tender without lymphadenopathy Cardio: Normal S1 and S2, no S3 or S4. Rhythm is regular. No murmurs or rubs.   Pulm: Clear to auscultation bilaterally, no crackles, wheezing, or diminished breath sounds. Normal respiratory effort Abdomen: Bowel sounds normal. Abdomen soft and non-tender.  Extremities: No peripheral edema. Warm/ well perfused.  Strong radial pulse. Neuro: Cranial nerves grossly intact  Laboratory: Recent Labs  Lab 11/03/18 1340 11/04/18 0438 11/05/18 0324  WBC 6.5 5.7 5.5  HGB 9.0* 8.8* 8.4*  HCT 31.8* 30.3* 28.7*  PLT 231 237 216   Recent Labs  Lab 11/03/18 1340 11/04/18 0438 11/05/18 0324  NA 142 142 141  K 3.8 3.7 4.2  CL 107 107 107  CO2 25 26 27   BUN 11 16 18   CREATININE 0.91 1.07* 1.06*  CALCIUM 9.1 9.2 9.2  GLUCOSE 84 85 113*     Imaging/Diagnostic Tests: No results found.   Traci Haymaker, MD 11/05/2018, 6:32 AM MD student, [place] Intern pager: [phone], text pages welcome

## 2018-11-06 MED ORDER — TAMSULOSIN HCL 0.4 MG PO CAPS: 0.4000 mg | ORAL_CAPSULE | Freq: Every day | ORAL | Status: AC

## 2018-11-06 MED ORDER — OXYCODONE HCL 15 MG PO TABS: 15.0000 mg | ORAL_TABLET | Freq: Four times a day (QID) | ORAL | 0 refills | Status: DC

## 2018-11-06 MED ORDER — ACETAMINOPHEN 500 MG PO TABS: 500.0000 mg | ORAL_TABLET | ORAL | 0 refills | Status: DC | PRN

## 2018-11-06 NOTE — Progress Notes (Addendum)
Occupational Therapy Treatment Patient Details Name: Traci [name] MRN: [id number] DOB: Feb 23, 1946 Today's Date: 11/06/2018    History of present illness Traci [name] is a 73 y.o. female presenting with right sacral alar fracture secondary to a mechanical fall. PMH is significant for HFpEF with severe AI, osteoarthritis, anxiety, essential tremor, COPD with chronic respiratory failure, and hypothyroidism.   OT comments  Pt progressing slowly towards acute OT goals. Pt limited by 10/10 pain in low back/sacrum  which she describes as sharp and cramping. Focus of session was to be functional transfers but pt with uncontrolled descent after brief attempt to stand, due to pain. Pt returned to supine in bed. Nursing notified. Still recommending d/c plan to SNF once medically appropriate.  Follow Up Recommendations  SNF    Equipment Recommendations  Other (comment)(defer to next venue)    Recommendations for Other Services      Precautions / Restrictions Precautions Precautions: Fall Restrictions Weight Bearing Restrictions: Yes RLE Weight Bearing: Weight bearing as tolerated LLE Weight Bearing: Weight bearing as tolerated       Mobility Bed Mobility Overal bed mobility: Needs Assistance Bed Mobility: Supine to Sit;Sit to Supine     Supine to sit: Mod assist;HOB elevated Sit to supine: Min assist   General bed mobility comments: Mod A to powerup and pivot hips to EOB position, utilized bed pad for assist and guarding. Pt sitting in guarded position (leaning left). Min A to return to supine  Transfers Overall transfer level: Needs assistance Equipment used: Rolling walker (2 wheeled) Transfers: Sit to/from Stand Sit to Stand: Min assist;Mod assist         General transfer comment: Min A to steady to standing, Elevated bed height. Pt unable to tolerate standing, quickly returned to sitting with uncontrolled descent. Mod A to steady descent.    Balance Overall balance  assessment: Needs assistance Sitting-balance support: Bilateral upper extremity supported;Feet supported Sitting balance-Leahy Scale: Poor Sitting balance - Comments: Pt sitting in guarded position (leaning left).    Standing balance support: Bilateral upper extremity supported Standing balance-Leahy Scale: Poor Standing balance comment: unable to tolerate standing position, quickly returned to sitting                           ADL either performed or assessed with clinical judgement   ADL Overall ADL's : Needs assistance/impaired                                       General ADL Comments: Pt completed bed mobility, sat EOB a couple of minutes then attempted SPT. Upon reaching standing position pt groaned in pain and quickly sat back down "I can't. I'm sorry, It just hurts too bad."     Vision       Perception     Praxis      Cognition Arousal/Alertness: Awake/alert Behavior During Therapy: WFL for tasks assessed/performed Overall Cognitive Status: Within Functional Limits for tasks assessed                                          Exercises     Shoulder Instructions       General Comments      Pertinent Vitals/ Pain  Pain Assessment: 0-10 Pain Score: 10-Worst pain ever Pain Location: lower back Pain Descriptors / Indicators: Cramping;Grimacing;Guarding;Moaning Pain Intervention(s): Limited activity within patient's tolerance;Monitored during session;Repositioned;Ice applied;Other (comment)(Notified nursing. Pt not due for scheduled meds for awhile)  Home Living                                          Prior Functioning/Environment              Frequency  Min 2X/week        Progress Toward Goals  OT Goals(current goals can now be found in the care plan section)  Progress towards OT goals: Progressing toward goals(limited by 10/10 pain this session)  Acute Rehab OT Goals Patient  Stated Goal: to become stronger. get pain under control OT Goal Formulation: With patient Time For Goal Achievement: 11/20/18 Potential to Achieve Goals: Good ADL Goals Pt Will Perform Lower Body Dressing: with modified independence;with adaptive equipment;sitting/lateral leans Pt Will Transfer to Toilet: with modified independence;bedside commode;ambulating Pt Will Perform Toileting - Clothing Manipulation and hygiene: Independently Pt Will Perform Tub/Shower Transfer: Tub transfer;3 in 1;rolling walker;with min assist  Plan Discharge plan remains appropriate    Co-evaluation                 AM-PAC OT "6 Clicks" Daily Activity     Outcome Measure   Help from another person eating meals?: None Help from another person taking care of personal grooming?: A Little Help from another person toileting, which includes using toliet, bedpan, or urinal?: A Lot Help from another person bathing (including washing, rinsing, drying)?: A Little Help from another person to put on and taking off regular upper body clothing?: A Little Help from another person to put on and taking off regular lower body clothing?: A Lot 6 Click Score: 17    End of Session Equipment Utilized During Treatment: Rolling walker  OT Visit Diagnosis: Unsteadiness on feet (R26.81);Muscle weakness (generalized) (M62.81);Pain   Activity Tolerance Patient limited by pain   Patient Left in bed;with call bell/phone within reach   Nurse Communication Other (comment);Mobility status(pain level, session limited by pain)        Time: 2836-6294 OT Time Calculation (min): 20 min  Charges: OT General Charges $OT Visit: 1 Visit OT Treatments $Self Care/Home Management : 8-22 mins  Tyrone Schimke, OT Acute Rehabilitation Services Pager: [phone] Office: [phone]    Hortencia Pilar 11/06/2018, 1:36 PM

## 2018-11-06 NOTE — Progress Notes (Signed)
Report called to [place] at [facility]. All questions answered. Pt's belongings gathered to be sent with her.

## 2018-11-06 NOTE — TOC Transition Note (Addendum)
Transition of Care [facility]) - CM/SW Discharge Note   Patient Details  Name: Traci [name] MRN: [id number] Date of Birth: Apr 30, 1946  Transition of [facility]) CM/SW Contact:  Alberteen Sam, [place] Phone Number: 11/06/2018, 12:24 PM   Clinical Narrative:     Patient will DC to: [place] Anticipated DC date: 11/06/2018 Family notified: Marisa Severin (patient's daughter) Transport by: Corey Harold  Per MD patient ready for DC to [facility]. RN, patient, patient's family, and facility notified of DC. Discharge Summary sent to facility. RN given number for report [phone] ask for [place]. DC packet on chart. Ambulance transport requested for patient.  CSW signing off.  [place], [place]   Final next level of care: Skilled Nursing Facility Barriers to Discharge: No Barriers Identified   Patient Goals and CMS Choice Patient states their goals for this hospitalization and ongoing recovery are:: patient wants to go to rehab then go home CMS Medicare.gov Compare Post Acute Care list provided to:: Patient Represenative (must comment)(patient's daughter Marisa Severin) Choice offered to / list presented to : Adult Children  Discharge Placement PASRR number recieved: 11/05/18            Patient [facility] Patient to be transferred to facility by: [place] Name of family member notified: Marisa Severin Patient and family notified of of transfer: 11/06/18  Discharge Plan and Services   Discharge Planning Services: NA Post Acute Care Choice: [place]          DME Arranged: N/A DME Agency: NA HH Arranged: NA HH Agency: NA   Social Determinants of Health (SDOH) Interventions     Readmission Risk Interventions No flowsheet data found.

## 2018-11-06 NOTE — Progress Notes (Signed)
PT Cancellation Note  Patient Details Name: Traci [name] MRN: [id number] DOB: June 20, 1946   Cancelled Treatment:    Reason Eval/Treat Not Completed: Pain limiting ability to participate - Pt in severe pain and defers therapy at this time, per OT who just left pt room. PT to check back as schedule allows. Pt may d/c SNF today.   Julien Girt, PT Acute Rehabilitation Services Pager [phone]  Office [phone]   [place] 11/06/2018, 12:45 PM

## 2019-10-28 ENCOUNTER — Encounter: Payer: Self-pay | Admitting: Physician Assistant

## 2019-10-28 ENCOUNTER — Telehealth (INDEPENDENT_AMBULATORY_CARE_PROVIDER_SITE_OTHER): Payer: Medicare Other | Admitting: Physician Assistant

## 2019-10-28 VITALS — Ht 69.0 in | Wt 150.0 lb

## 2019-10-28 DIAGNOSIS — J449 Chronic obstructive pulmonary disease, unspecified: Secondary | ICD-10-CM

## 2019-10-28 DIAGNOSIS — I061 Rheumatic aortic insufficiency: Secondary | ICD-10-CM

## 2019-10-28 DIAGNOSIS — I10 Essential (primary) hypertension: Secondary | ICD-10-CM

## 2019-10-28 DIAGNOSIS — I051 Rheumatic mitral insufficiency: Secondary | ICD-10-CM | POA: Diagnosis not present

## 2019-10-28 NOTE — Patient Instructions (Signed)
Medication Instructions:  Your physician recommends that you continue on your current medications as directed. Please refer to the Current Medication list given to you today.  *If you need a refill on your cardiac medications before your next appointment, please call your pharmacy*  Lab Work: NONE ordered at this time of appointment   If you have labs (blood work) drawn today and your tests are completely normal, you will receive your results only by: . MyChart Message (if you have MyChart) OR . A paper copy in the mail If you have any lab test that is abnormal or we need to change your treatment, we will call you to review the results.  Testing/Procedures: NONE ordered at this time of appointment   Follow-Up: At [facility], you and your health needs are our priority.  As part of our continuing mission to provide you with exceptional heart care, we have created designated Provider Care Teams.  These Care Teams include your primary Cardiologist (physician) and Advanced Practice Providers (APPs -  Physician Assistants and Nurse Practitioners) who all work together to provide you with the care you need, when you need it.  We recommend signing up for the patient portal called "MyChart".  Sign up information is provided on this After Visit Summary.  MyChart is used to connect with patients for Virtual Visits (Telemedicine).  Patients are able to view lab/test results, encounter notes, upcoming appointments, etc.  Non-urgent messages can be sent to your provider as well.   To learn more about what you can do with MyChart, go to [email or web address].    Your next appointment:   6 month(s)  The format for your next appointment:   In Person  Provider:   K. Chad Hilty, MD  Other Instructions   

## 2019-10-28 NOTE — Progress Notes (Signed)
Virtual Visit via Telephone Note   This visit type was conducted due to national recommendations for restrictions regarding the COVID-19 Pandemic (e.g. social distancing) in an effort to limit this patient's exposure and mitigate transmission in our community.  Due to her co-morbid illnesses, this patient is at least at moderate risk for complications without adequate follow up.  This format is felt to be most appropriate for this patient at this time.  The patient did not have access to video technology/had technical difficulties with video requiring transitioning to audio format only (telephone).  All issues noted in this document were discussed and addressed.  No physical exam could be performed with this format.  Please refer to the patient's chart for her  consent to telehealth for [facility].   The patient was identified using 2 identifiers.  Date:  10/30/2019   ID:  Traci [name], DOB 02/10/1946, MRN [id number]  Patient Location: Home Provider Location: Home  PCP:  Bonnita Nasuti, MD  Cardiologist:  Pixie Casino, MD  Electrophysiologist:  None   Evaluation Performed:  Follow-Up Visit  Chief Complaint:  followup  History of Present Illness:    Traci [name] is a 74 y.o. female with past medical history of moderate to severe COPD on 2L [place], HTN, anxiety and depression.  She underwent cardiac catheterization in June 2013 that revealed nonobstructive CAD with normal LV function. Patient was admitted in December 2017 with COPD exacerbation. Echocardiogram obtained at the time showed EF 55 to 60%, grade 2 DD, moderate mitral stenosis, PA peak pressure 45 mmHg. There was a question whether she has rheumatic heart disease. Therefore she underwent TEE on 09/18/2016, the study revealed EF 60 to 65%, moderate thickening of the mitral valve consistent with rheumatic disease, mobility of the posterior leaflet was moderately restricted, moderate to severe mitral regurgitation directed  centrally. During the hospitalization, she also underwent diuresis as well and was eventually discharged on home oxygen. Repeat echocardiogram obtained on 01/24/2018 showed EF 60 to 65%, mild to moderate MS, moderate MR, severe aortic regurgitation. She was last seen by Dr. Debara Pickett on 08/04/2018, given the severity of her COPD, it was felt that she would unlikely to be a candidate for mitral valve replacement surgery, however it is questionable whether or not she will be a candidate for TAVR for aortic regurgitation. Patient was seen by Dr. Cyndia Bent who felt that her TAVR surgical risk is prohibitive given severe COPD.  Patient presents today for virtual visit.  She denies any worsening dyspnea when compared to her baseline.  She does not do much activity at this point.  Otherwise she has no chest discomfort.  She is aware that with her underlying pulmonary issue, she would be at high risk for any lung infection.  The patient does not have symptoms concerning for COVID-19 infection (fever, chills, cough, or new shortness of breath).    Past Medical History:  Diagnosis Date  . Anginal pain ([place])   . Anxiety   . Arthritis   . Chronic bronchitis ([place])   . Chronic lower back pain   . COPD (chronic obstructive pulmonary disease) ([place])   . Depression   . Dyspepsia   . GERD (gastroesophageal reflux disease)   . H/O hiatal hernia   . [id number])    "1-2/week" (08/16/2016)  . History of blood transfusion 1963   w/childbirth  . Hyperlipidemia   . Hypertension   . Hypothyroidism   . Shortness of breath 02/12/12   "  all the time"  . Tremor    Past Surgical History:  Procedure Laterality Date  . BACK SURGERY    . CARDIAC CATHETERIZATION  04/19/1995   EF 65-70%  . CATARACT EXTRACTION W/ INTRAOCULAR LENS  IMPLANT, BILATERAL  2012  . FRACTURE SURGERY    . LAPAROSCOPIC CHOLECYSTECTOMY  2010  . LEFT HEART CATHETERIZATION WITH CORONARY ANGIOGRAM N/A 02/13/2012   Procedure: LEFT HEART CATHETERIZATION  WITH CORONARY ANGIOGRAM;  Surgeon: Thayer Headings, MD;  Location: [facility] CATH LAB;  Service: Cardiovascular;  Laterality: N/A;  . LUMBAR DISC SURGERY  early 2000's  . TEE WITHOUT CARDIOVERSION N/A 08/22/2016   Procedure: TRANSESOPHAGEAL ECHOCARDIOGRAM (TEE);  Surgeon: Sanda Klein, MD;  Location: [place];  Service: Cardiovascular;  Laterality: N/A;  . TIBIA IM NAIL INSERTION Right 03/30/2014   Procedure: INTRAMEDULLARY (IM) NAIL TIBIAL;  Surgeon: Wylene Simmer, MD;  Location: [place];  Service: Orthopedics;  Laterality: Right;  . US ECHOCARDIOGRAPHY  12/17/2007   EF 55-60%  . VAGINAL HYSTERECTOMY  1980's     Current Meds  Medication Sig  . albuterol (PROVENTIL HFA;VENTOLIN HFA) 108 (90 Base) MCG/ACT inhaler Inhale 2 puffs into the lungs every 6 (six) hours as needed for wheezing or shortness of breath.  . ALPRAZolam (XANAX) 0.5 MG tablet Take by mouth.  . ARIPiprazole (ABILIFY) 2 MG tablet Take by mouth.  Marland Kitchen buPROPion (WELLBUTRIN XL) 150 MG 24 hr tablet Take 450 mg by mouth daily.   . busPIRone (BUSPAR) 30 MG tablet Take by mouth.  . donepezil (ARICEPT) 5 MG tablet Take 5 mg by mouth at bedtime.  . folic acid (FOLVITE) 1 MG tablet Take 1 tablet (1 mg total) by mouth daily.  Marland Kitchen levothyroxine (SYNTHROID, LEVOTHROID) 25 MCG tablet Take 25 mcg by mouth daily before breakfast.   . omeprazole (PRILOSEC) 20 MG capsule Take 20 mg by mouth daily.  Marland Kitchen oxyCODONE (ROXICODONE) 15 MG immediate release tablet Take 1 tablet (15 mg total) by mouth every 6 (six) hours.  . OXYGEN Inhale 2 L into the lungs as needed.  . tamsulosin (FLOMAX) 0.4 MG CAPS capsule Take 1 capsule (0.4 mg total) by mouth daily.  . traZODone (DESYREL) 150 MG tablet Take 300 mg by mouth at bedtime as needed for sleep.      Allergies:   Cephalexin, Cymbalta [duloxetine hcl], and Latex   Social History   Tobacco Use  . Smoking status: Former Smoker    Packs/day: 0.10    Years: 55.00    Pack years: 5.50    Types: Cigarettes    Quit  date: 08/19/2016    Years since quitting: 3.1  . Smokeless tobacco: Never Used  . Tobacco comment: Encouraged to remain smoke free  Substance Use Topics  . Alcohol use: No    Alcohol/week: 0.0 standard drinks  . Drug use: No     Family Hx: The patient's family history includes Emphysema in her sister; Heart attack in her father; Heart disease in her brother; Obesity in her sister.  ROS:   Please see the history of present illness.     All other systems reviewed and are negative.   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   -------------------------------------------------------------------  History:  PMH:  Dyspnea. Congestive heart failure. Chronic  obstructive pulmonary disease. Risk factors: Rheumatic mitral and  aortic valves. Tachycardia. Former tobacco use. Hypertension.  Dyslipidemia.   -------------------------------------------------------------------  Study Conclusions   - Left ventricle: The cavity size was normal. Wall thickness was  normal. Systolic function was normal. The estimated ejection  fraction was in the range of 60% to 65%. Wall motion was normal;  there were no regional wall motion abnormalities. The study is  not technically sufficient to allow evaluation of LV diastolic  function. LV filling pressure is elevated.  - Aortic valve: Poorly visualized. Sclerosis without stenosis.  There was severe regurgitation.  - Mitral valve: Mildly thickened leaflets with poor anterior  leaflet excursion. Mild to moderate stenosis. There was moderate  regurgitation. Mean gradient (D): 9 mm Hg. Peak gradient (D): 15  mm Hg.  - Inferior vena cava: The vessel was normal in size. The  respirophasic diameter changes were in the normal range (= 50%),  consistent with normal  central venous pressure.   Impressions:   - Compared to a prior study in 2018, the LVEF is unchanged. There  is severe AI and moderate MR with mild to moderate AS.   Labs/Other Tests and Data Reviewed:    EKG:  An ECG dated 08/04/2018 was personally reviewed today and demonstrated:  Normal sinus rhythm without significant ST-T wave changes.  Recent Labs: 11/05/2018: ALT 17; BUN 18; Creatinine, Ser 1.06; Hemoglobin 8.4; Platelets 216; Potassium 4.2; Sodium 141   Recent Lipid Panel Lab Results  Component Value Date/Time   CHOL 139 09/24/2011 09:26 AM   TRIG 85.0 09/24/2011 09:26 AM   HDL 56.30 09/24/2011 09:26 AM   CHOLHDL 2 09/24/2011 09:26 AM   LDLCALC 66 09/24/2011 09:26 AM    Wt Readings from Last 3 Encounters:  10/28/19 150 lb (68 kg)  11/04/18 171 lb 4.8 oz (77.7 kg)  10/28/18 168 lb (76.2 kg)     Objective:    Vital Signs:  Ht 5\' 9"  (1.753 m)   Wt 150 lb (68 kg)   BMI 22.15 kg/m    VITAL SIGNS:  reviewed  ASSESSMENT & PLAN:    1. Mitral valve regurgitation: Likely related to rheumatic heart disease.  Previous echocardiogram obtained in 2019 demonstrated mild to moderate stenosis.  2. Aortic valve regurgitation: Likely related to rheumatic heart disease.  Not a candidate for TAVR surgery after evaluated by Dr. Cyndia Bent.   3. Hypertension: Continue on current therapy  4. COPD: On 24/7 oxygen  COVID-19 Education: The signs and symptoms of COVID-19 were discussed with the patient and how to seek care for testing (follow up with PCP or arrange E-visit).  The importance of social distancing was discussed today.  Time:   Today, I have spent 9 minutes with the patient with telehealth technology discussing the above problems.     Medication Adjustments/Labs and Tests Ordered: Current medicines are reviewed at length with the patient today.  Concerns regarding medicines are outlined above.   Tests Ordered: No orders of the defined types were placed in this  encounter.   Medication Changes: No orders of the defined types were placed in this encounter.   Follow Up:  Either In Person or Virtual in 6 month(s)  Signed, Almyra Deforest, [place]  10/30/2019 11:40 PM    [facility]

## 2019-11-04 ENCOUNTER — Encounter: Payer: Self-pay | Admitting: Neurology

## 2019-11-23 ENCOUNTER — Other Ambulatory Visit: Payer: Self-pay | Admitting: *Deleted

## 2019-11-23 DIAGNOSIS — Z87891 Personal history of nicotine dependence: Secondary | ICD-10-CM

## 2019-12-03 NOTE — Progress Notes (Signed)
Assessment/Plan:   1.  Tremor  -Patient very well may have had an essential tremor (although based on daughters description, anxiety induced in the past cannot be ruled out).  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  I explained that one clinically cannot tell the difference between idiopathic parkinsons disease and secondary parkinsonism from medication.  I also explained that even if one is able to get off of the medication, it can take up to 6 months to clinically definitively know if this is idiopathic parkinsons disease.  I did not advise that the patient go off of medication, as this needs to be discussed with the patients prescribing physician.  I did, however, tell the patient that the longer one is on the medication, the worse the symptoms can get.  The patient is to make an appointment with her PCP to discuss what I have discussed with her.  -discussed with patient and daughter that if she is able to get off of abilify, I don't expect memory change and foot drop to get better.  She is following with Dr. Everette Rank in that regard.  Do suspect that she has dementia.    2.  F/u prn.  Told pt/daughter to come back to office if she is able to get off of abilify and if sx's don't resolve within 6 months of d/c.  They were agreeable.     Subjective:   Traci [name] was seen today in the movement disorders clinic for neurologic consultation at the request of Karl Luke, MD.  The consultation is for the evaluation of tremor.  The patient has seen multiple neurologists for this, including Dr. Krista Blue, Dr. Chales Salmon, Dr. Everette Rank and now myself.  Records made available to me are reviewed.  Patient saw [facility] neurology in 2015 for hand tremor.  Per records, have been going on for about 3 years at the time.  The examination itself did not mention much about tremor, but the assessment stated that it was felt she had essential tremor.  She was started on primidone.  She followed back up  a few months later and her primidone was increased to 50 mg, 2 tablets 3 times per day.  She subsequently saw Dr. Jannifer Franklin in 2017.  Dr. Jannifer Franklin stated that she suspected that the patient may have "essential tremor with symptom magnification."  She recommended a trial of propranolol.  She followed up a few months later, and the patient stated that propranolol did not help (only was on 10 mg) and the patient did not refill it.  When she followed back up, she was started on primidone and told to work to 100 mg 3 times a day.  She did not follow-up until 2020, at which point she began to see Dr. Everette Rank in October, 2020.  He noted patient had components of both resting and intention tremor.  He also noted memory loss (patient was now on Abilify).  She has been followed intermittently since that time.  Donepezil has been considered for the memory change.  She has been diagnosed with severe peripheral neuropathy.  She had an MRI of the brain.  This was done on June 23, 2019.  This was stable compared to her scan in June, 2019.  There was moderate small vessel disease.  I reviewed the 2019 scan and there was mild to moderate small vessel disease.  Tremor: Yes.     How long has it been going on? 12 years per daughter but its really  changed.  It started out as a "spell" where she couldn't move and then was worse after her grandson was killed in 2010 (he lived with her) and didn't get better.    At rest or with activation?  The rest tremor is new but she always had some tremor with grabbing objects  Located where?  L hand worse is worse than the R.  She is R hand dominant  Affected by caffeine: doesn't know (drinks 3 small cups coffee per day)  Affected by alcohol:  Doesn't drink alcohol  Affected by stress:  Yes.    Affected by fatigue:  No.  Spills soup if on spoon:  No. per pt, but daughter states that she will spill it on self and around her  Tremor inducing meds:  Yes.   (albuterol - uses 1 time per day;  abilify - they don't know how long she has been on it)  Other Specific Symptoms:  Voice: no change Sleep: trouble getting to sleep  Vivid Dreams:  No.  Acting out dreams:  No. Wet Pillows: No. Postural symptoms:  Yes.    Falls?  Yes.   (1 time per year or 1 time every other year - went in [facility] over a year ago because of falls) Bradykinesia symptoms: shuffling gait, slow movements and difficulty getting out of a chair; uses WC most of the time Loss of smell:  No. Loss of taste:  No. Urinary Incontinence:  No. Difficulty Swallowing:  No. Trouble with ADL's:  Yes.    Trouble buttoning clothing: Yes.   Memory changes:  Yes.   (lives with husband; daughter helps bathe x 2 years; daughter does meds into box and husband gives to her) Hallucinations:  Yes.   (doesn't happen daily and they are not sure if abilify for this or for mood) N/V:  No. Lightheaded:  No.  Syncope: No. Diplopia:  Yes.  , occasionally and daughter thinks d/t macular degeneration   ALLERGIES:   Allergies  Allergen Reactions  . Cephalexin Hives  . Aspirin Other (See Comments)    Upset GI  . Cymbalta [Duloxetine Hcl] Other (See Comments)    Sees spiders  . Latex     " MAKES ME ITCHY "     CURRENT MEDICATIONS:  Current Outpatient Medications  Medication Instructions  . albuterol (PROVENTIL HFA;VENTOLIN HFA) 108 (90 Base) MCG/ACT inhaler 2 puffs, Inhalation, Every 6 hours PRN  . ALPRAZolam (XANAX) 0.5 mg, Oral, 3 times daily PRN  . ARIPiprazole (ABILIFY) 2 mg, Oral, Daily  . buPROPion (WELLBUTRIN XL) 450 mg, Oral, Daily  . busPIRone (BUSPAR) 30 mg, Oral, 3 times daily  . donepezil (ARICEPT) 5 mg, Oral, Daily at bedtime  . folic acid (FOLVITE) 1 mg, Oral, Daily  . levothyroxine (SYNTHROID) 25 mcg, Oral, Daily before breakfast  . omeprazole (PRILOSEC) 20 mg, Oral, Daily  . oxyCODONE (ROXICODONE) 15 mg, Oral, Every 6 hours  . OXYGEN 2 L, Inhalation, As needed  . tamsulosin (FLOMAX) 0.4 mg, Oral, Daily  .  traZODone (DESYREL) 300 mg, Oral, At bedtime PRN    Objective:   PHYSICAL EXAMINATION:    VITALS:   Vitals:   12/07/19 0858  BP: 106/63  Pulse: 83  SpO2: 91%  Weight: 140 lb (63.5 kg)  Height: 5\' 9"  (1.753 m)    GEN:  The patient appears stated age and is in NAD. HEENT:  Normocephalic, atraumatic.  The mucous membranes are moist. The superficial temporal arteries are without ropiness or tenderness.  CV:  RRR Lungs:  Some exp wheezes.  Poor air movement.  Not wearing her O2 Neck/HEME:  There are no carotid bruits bilaterally.  Neurological examination:  Orientation: The patient is alert and oriented x2.  Cranial nerves: There is good facial symmetry.  Extraocular muscles are intact. The visual fields are full to confrontational testing. The speech is fluent and clear. Soft palate rises symmetrically and there is no tongue deviation. Hearing is intact to conversational tone. Sensation: Sensation is intact to light touch throughout (facial, trunk, extremities). Vibration is intact at the bilateral big toe. There is no extinction with double simultaneous stimulation.  Motor: Strength is 5/5 in the bilateral upper and lower extremities.   Shoulder shrug is equal and symmetric.  There is no pronator drift. Deep tendon reflexes: Deep tendon reflexes are 1/4 at the bilateral biceps, triceps, brachioradialis, patella and achilles. Plantar responses are downgoing bilaterally.  Movement examination: Tone: There is normal tone in the bilateral upper extremities.  The tone in the lower extremities is UE/LE.  Abnormal movements: there is LUE>RUE resting tremor Coordination:  There is decremation with RAM's, with toe taps on the L only.  All other RAMs are good Gait and Station: The patient requires mild assist out of the WC.  She is given a walker.  She has foot drop on the L so has a steppage gait on the L to accomodate that.      Total time spent on today's visit was  60 minutes, including  both face-to-face time and nonface-to-face time.  Time included that spent on review of records (prior notes available to me/labs/imaging if pertinent), discussing treatment and goals, answering patient's questions and coordinating care.  Cc:  H[place], Rosalyn Charters, MD

## 2019-12-07 ENCOUNTER — Ambulatory Visit (INDEPENDENT_AMBULATORY_CARE_PROVIDER_SITE_OTHER): Payer: Medicare Other | Admitting: Neurology

## 2019-12-07 ENCOUNTER — Other Ambulatory Visit: Payer: Self-pay

## 2019-12-07 ENCOUNTER — Encounter: Payer: Self-pay | Admitting: Neurology

## 2019-12-07 VITALS — BP 106/63 | HR 83 | Ht 69.0 in | Wt 140.0 lb

## 2019-12-07 DIAGNOSIS — G2111 Neuroleptic induced parkinsonism: Secondary | ICD-10-CM

## 2019-12-07 DIAGNOSIS — R413 Other amnesia: Secondary | ICD-10-CM | POA: Diagnosis not present

## 2019-12-07 DIAGNOSIS — T43505A Adverse effect of unspecified antipsychotics and neuroleptics, initial encounter: Secondary | ICD-10-CM

## 2019-12-07 NOTE — Patient Instructions (Signed)
Good to see you today.  As a reminder, you have secondary parkinsonism due to abilify.  I don't want you to stop this.  I want you to see if there is an alternative medication from the prescribing physician.  If there is, it could take up to 6 months to see if the symptoms get better.  Remember that the memory change and foot drop are separate and are not expected to get better with medication change.

## 2019-12-09 ENCOUNTER — Ambulatory Visit
Admission: RE | Admit: 2019-12-09 | Discharge: 2019-12-09 | Disposition: A | Discharge status: Home / Self Care | Payer: Medicare Other | Source: Ambulatory Visit | Attending: Acute Care | Admitting: Acute Care

## 2019-12-09 DIAGNOSIS — Z87891 Personal history of nicotine dependence: Secondary | ICD-10-CM

## 2019-12-10 ENCOUNTER — Telehealth: Payer: Self-pay | Admitting: Acute Care

## 2019-12-10 DIAGNOSIS — Z87891 Personal history of nicotine dependence: Secondary | ICD-10-CM

## 2019-12-10 NOTE — Telephone Encounter (Signed)
I have  called patient and let them  know their  low dose Ct was read as a Lung RADS 4 A : suspicious findings, either short term follow up in 3 months or alternatively  PET Scan evaluation may be considered when there is a solid component of  8 mm or larger..Please let them  know we will order and schedule their 3 month follow up screening scan for 02/2020.  Pt.  is not  currently on statin therapy. Please place order for 3 month follow up CT screening scan for  02/2020 and fax results to PCP. Thanks so much.  Thanks [employer]

## 2019-12-11 ENCOUNTER — Other Ambulatory Visit: Payer: Self-pay | Admitting: *Deleted

## 2019-12-11 DIAGNOSIS — R911 Solitary pulmonary nodule: Secondary | ICD-10-CM

## 2019-12-11 DIAGNOSIS — Z87891 Personal history of nicotine dependence: Secondary | ICD-10-CM

## 2019-12-11 NOTE — Addendum Note (Signed)
Addended by: Doroteo Glassman D on: 12/11/2019 08:49 AM   Modules accepted: Orders

## 2019-12-11 NOTE — Telephone Encounter (Signed)
Results faxed to PCP. Order placed for 3 mth f/u low dose ct.

## 2019-12-19 IMAGING — CT CT OF THE RIGHT HIP WITHOUT CONTRAST
2 of 3 series · 17 of 46 positions shown, 19 images · non-contrast
Comparison: Plain films right hip 10/28/2018.

CLINICAL DATA: The patient suffered a right hip injury when she
fell backwards 10/27/2018. Continued pain. Subsequent encounter.

EXAM:
CT OF THE RIGHT HIP WITHOUT CONTRAST
TECHNIQUE: Multidetector CT imaging of the right hip was performed according to
the standard protocol. Multiplanar CT image reconstructions were
also generated.

[Series 3: pelvis 2.0 st · axial · 0.43mm/px · z∈[+854,+1022]mm · 14 of 98 slices shown, 16 images]
[im 7/98  soft-tissue]
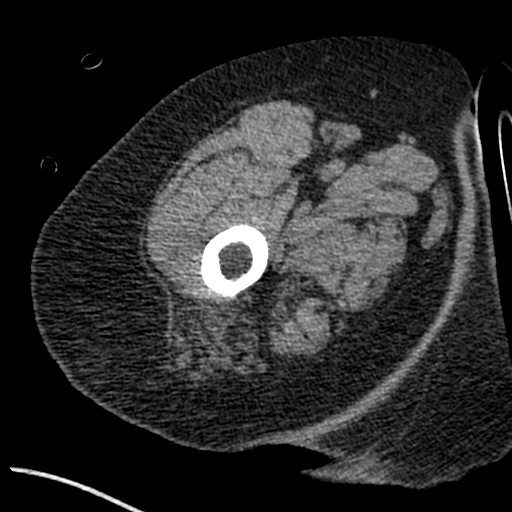
[im 7/98  bone]
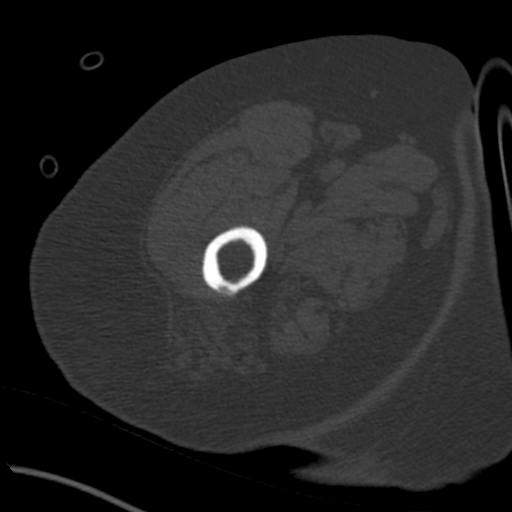
[im 13/98  soft-tissue]
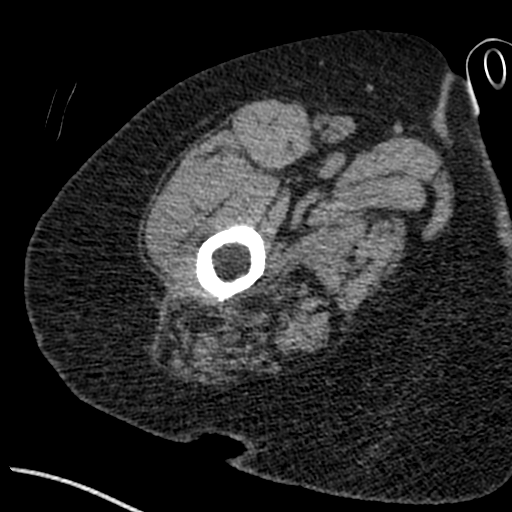
[im 19/98  soft-tissue]
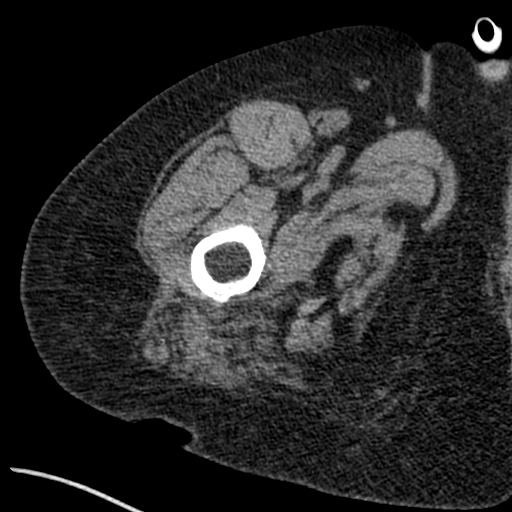
[im 26/98  soft-tissue]
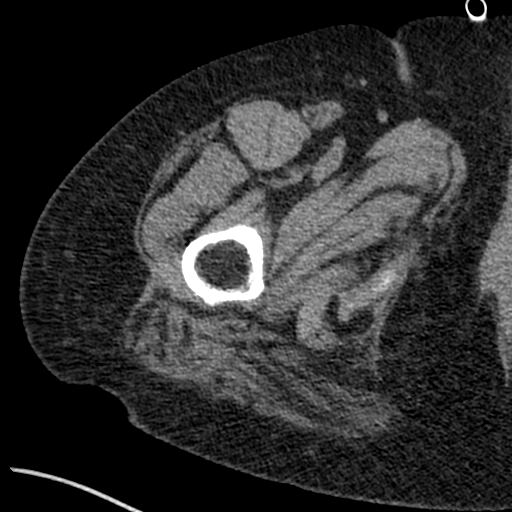
[im 32/98  soft-tissue]
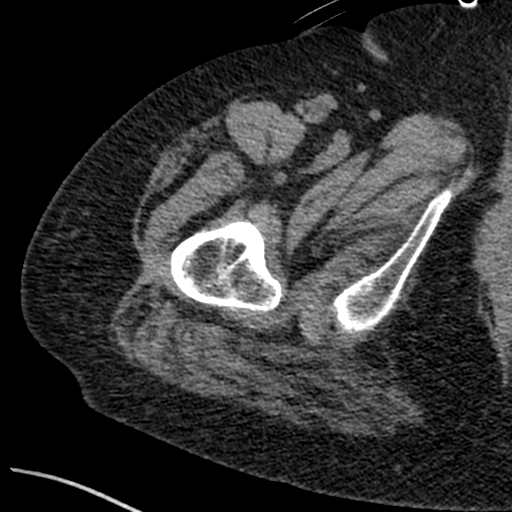
[im 38/98  soft-tissue]
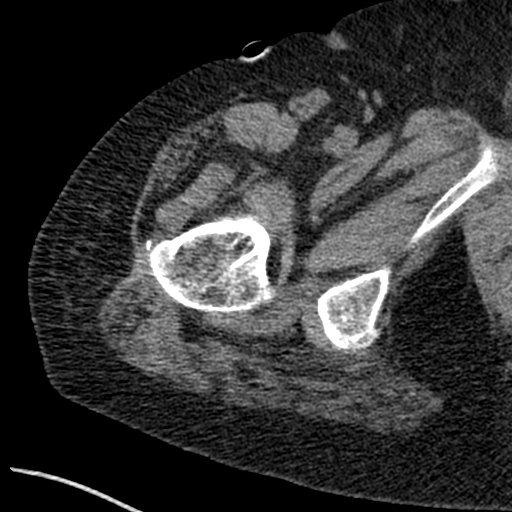
[im 44/98  soft-tissue]
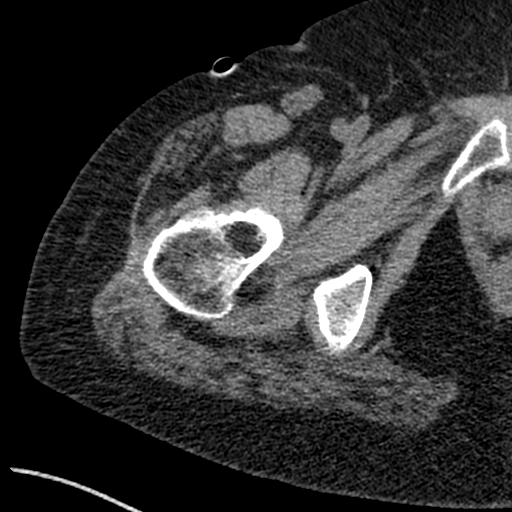
[im 54/98  soft-tissue]
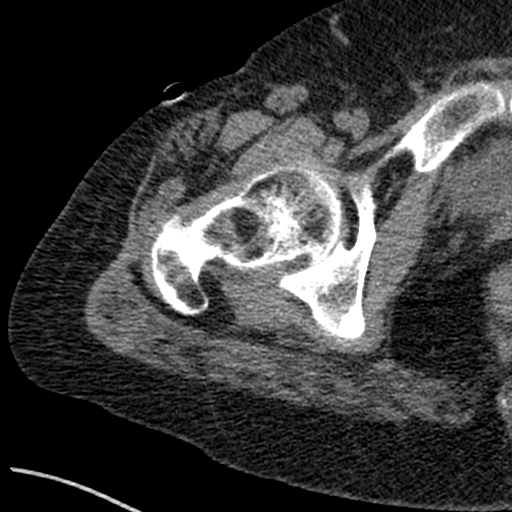
[im 60/98  soft-tissue]
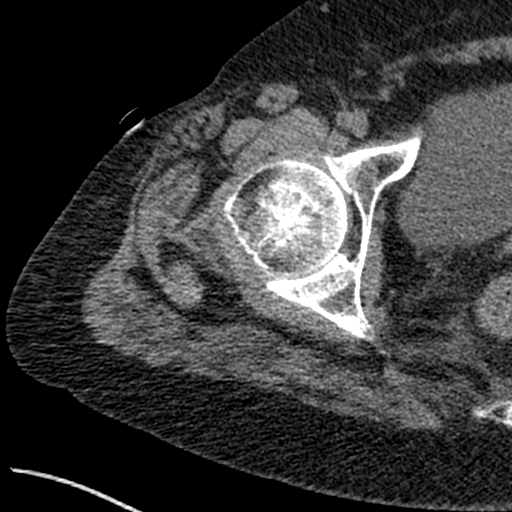
[im 60/98  bone]
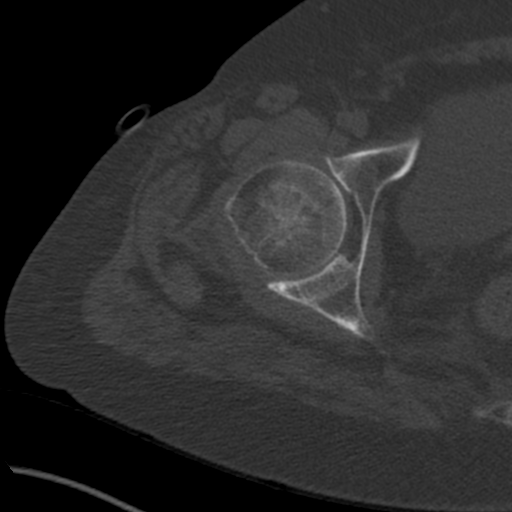
[im 66/98  soft-tissue]
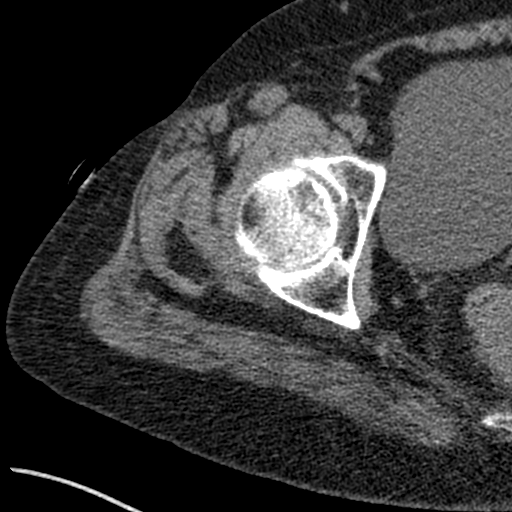
[im 72/98  soft-tissue]
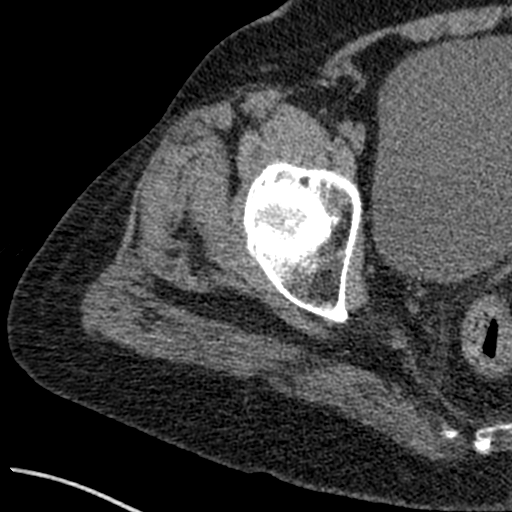
[im 79/98  soft-tissue]
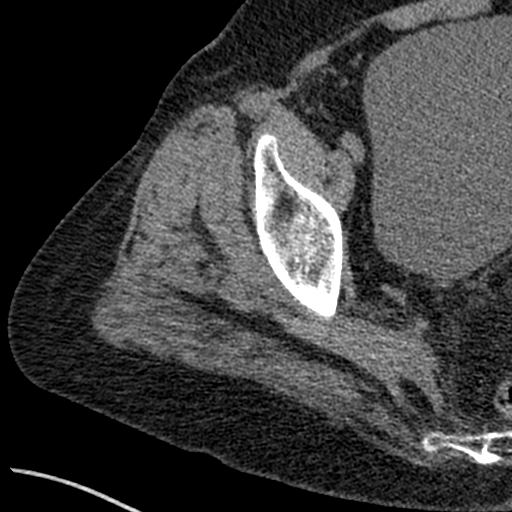
[im 85/98  soft-tissue]
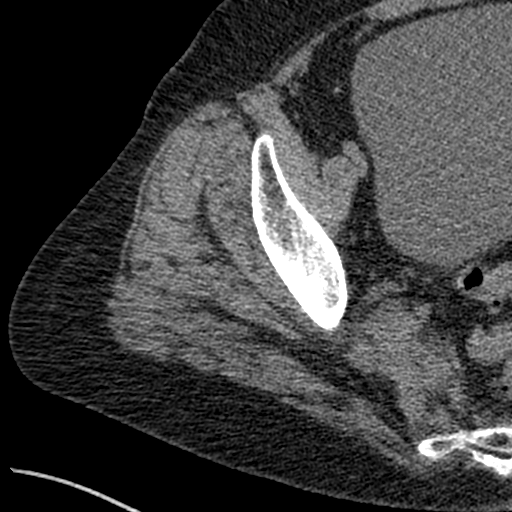
[im 91/98  soft-tissue]
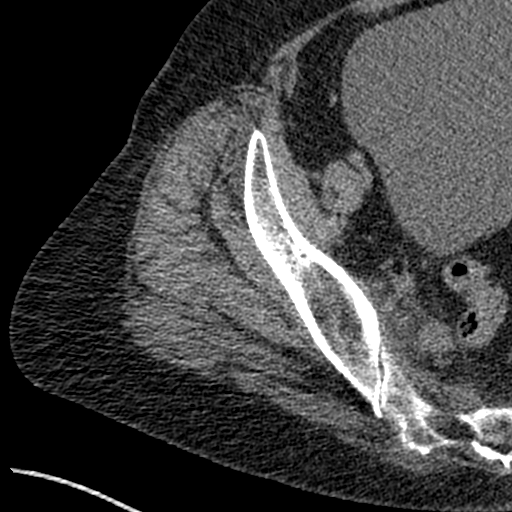

[Series 8: coronal st · coronal · 0.37mm/px · 3 of 106 slices shown]
[im 36/106  soft-tissue]
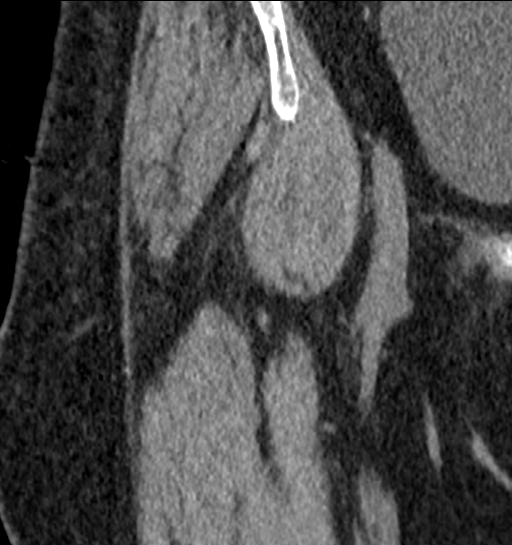
[im 47/106  soft-tissue]
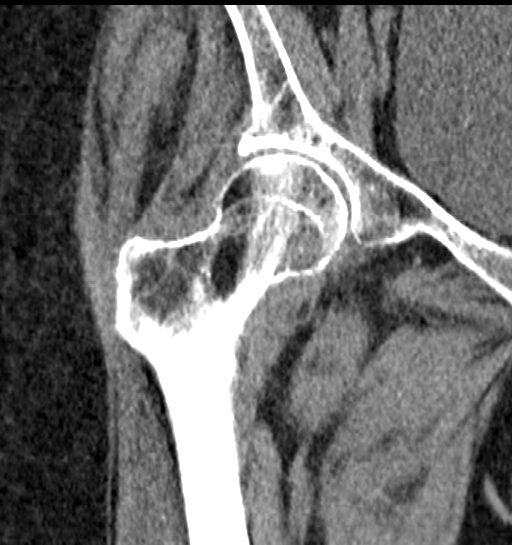
[im 59/106  soft-tissue]
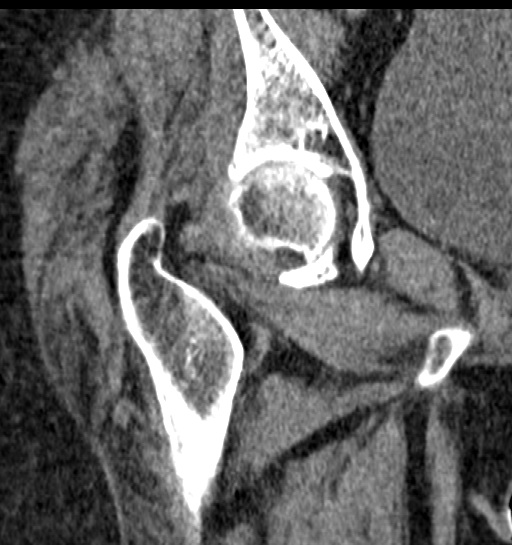

[17 of 46 positions shown; findings below may reference images not displayed]

FINDINGS: Bones/Joint/Cartilage

The patient has an acute fracture of the right sacrum. No other
fracture is identified. The patient has moderate appearing right hip
osteoarthritis with joint space narrowing, small subchondral cyst in
the femoral head and some osteophytosis present.

Ligaments

Suboptimally assessed by CT.

Muscles and Tendons

Intact and normal in appearance.

Soft tissues

Imaged intrapelvic contents are negative.
IMPRESSION: Acute fracture of the right sacral ala is incompletely imaged.

Negative for hip fracture.

Moderate right hip osteoarthritis.

## 2020-03-09 ENCOUNTER — Other Ambulatory Visit: Payer: Medicare Other

## 2020-05-23 ENCOUNTER — Inpatient Hospital Stay: Admission: RE | Admit: 2020-05-23 | Payer: Medicare Other | Source: Ambulatory Visit

## 2020-07-13 ENCOUNTER — Encounter (HOSPITAL_COMMUNITY): Payer: Self-pay | Admitting: Emergency Medicine

## 2020-07-13 ENCOUNTER — Other Ambulatory Visit: Payer: Self-pay

## 2020-07-13 ENCOUNTER — Emergency Department (HOSPITAL_COMMUNITY): Payer: Medicare Other

## 2020-07-13 ENCOUNTER — Inpatient Hospital Stay (HOSPITAL_COMMUNITY)
Admission: EM | Admit: 2020-07-13 | Discharge: 2020-07-22 | DRG: 682 | Disposition: A | Discharge status: Skilled Nursing Facility | Payer: Medicare Other | Attending: Internal Medicine | Admitting: Internal Medicine

## 2020-07-13 DIAGNOSIS — Y92009 Unspecified place in unspecified non-institutional (private) residence as the place of occurrence of the external cause: Secondary | ICD-10-CM

## 2020-07-13 DIAGNOSIS — G8929 Other chronic pain: Secondary | ICD-10-CM | POA: Diagnosis present

## 2020-07-13 DIAGNOSIS — R54 Age-related physical debility: Secondary | ICD-10-CM | POA: Diagnosis present

## 2020-07-13 DIAGNOSIS — Z20822 Contact with and (suspected) exposure to covid-19: Secondary | ICD-10-CM | POA: Diagnosis present

## 2020-07-13 DIAGNOSIS — E860 Dehydration: Secondary | ICD-10-CM | POA: Diagnosis present

## 2020-07-13 DIAGNOSIS — Z87891 Personal history of nicotine dependence: Secondary | ICD-10-CM

## 2020-07-13 DIAGNOSIS — Z66 Do not resuscitate: Secondary | ICD-10-CM | POA: Diagnosis present

## 2020-07-13 DIAGNOSIS — S2242XA Multiple fractures of ribs, left side, initial encounter for closed fracture: Secondary | ICD-10-CM | POA: Diagnosis present

## 2020-07-13 DIAGNOSIS — Z79890 Hormone replacement therapy (postmenopausal): Secondary | ICD-10-CM

## 2020-07-13 DIAGNOSIS — Z993 Dependence on wheelchair: Secondary | ICD-10-CM

## 2020-07-13 DIAGNOSIS — I351 Nonrheumatic aortic (valve) insufficiency: Secondary | ICD-10-CM | POA: Diagnosis present

## 2020-07-13 DIAGNOSIS — K219 Gastro-esophageal reflux disease without esophagitis: Secondary | ICD-10-CM | POA: Diagnosis present

## 2020-07-13 DIAGNOSIS — J9811 Atelectasis: Secondary | ICD-10-CM | POA: Diagnosis present

## 2020-07-13 DIAGNOSIS — N179 Acute kidney failure, unspecified: Principal | ICD-10-CM | POA: Diagnosis present

## 2020-07-13 DIAGNOSIS — Z961 Presence of intraocular lens: Secondary | ICD-10-CM | POA: Diagnosis present

## 2020-07-13 DIAGNOSIS — F1120 Opioid dependence, uncomplicated: Secondary | ICD-10-CM | POA: Diagnosis present

## 2020-07-13 DIAGNOSIS — F419 Anxiety disorder, unspecified: Secondary | ICD-10-CM | POA: Diagnosis present

## 2020-07-13 DIAGNOSIS — J9601 Acute respiratory failure with hypoxia: Secondary | ICD-10-CM | POA: Diagnosis present

## 2020-07-13 DIAGNOSIS — E785 Hyperlipidemia, unspecified: Secondary | ICD-10-CM | POA: Diagnosis present

## 2020-07-13 DIAGNOSIS — R0902 Hypoxemia: Secondary | ICD-10-CM

## 2020-07-13 DIAGNOSIS — I110 Hypertensive heart disease with heart failure: Secondary | ICD-10-CM | POA: Diagnosis present

## 2020-07-13 DIAGNOSIS — E872 Acidosis: Secondary | ICD-10-CM | POA: Diagnosis present

## 2020-07-13 DIAGNOSIS — S61412A Laceration without foreign body of left hand, initial encounter: Secondary | ICD-10-CM | POA: Diagnosis present

## 2020-07-13 DIAGNOSIS — Z825 Family history of asthma and other chronic lower respiratory diseases: Secondary | ICD-10-CM

## 2020-07-13 DIAGNOSIS — I959 Hypotension, unspecified: Secondary | ICD-10-CM | POA: Diagnosis present

## 2020-07-13 DIAGNOSIS — Z91040 Latex allergy status: Secondary | ICD-10-CM

## 2020-07-13 DIAGNOSIS — Z888 Allergy status to other drugs, medicaments and biological substances status: Secondary | ICD-10-CM

## 2020-07-13 DIAGNOSIS — M545 Low back pain, unspecified: Secondary | ICD-10-CM | POA: Diagnosis present

## 2020-07-13 DIAGNOSIS — M549 Dorsalgia, unspecified: Secondary | ICD-10-CM | POA: Diagnosis present

## 2020-07-13 DIAGNOSIS — W010XXA Fall on same level from slipping, tripping and stumbling without subsequent striking against object, initial encounter: Secondary | ICD-10-CM | POA: Diagnosis present

## 2020-07-13 DIAGNOSIS — I5032 Chronic diastolic (congestive) heart failure: Secondary | ICD-10-CM | POA: Diagnosis present

## 2020-07-13 DIAGNOSIS — Z9981 Dependence on supplemental oxygen: Secondary | ICD-10-CM

## 2020-07-13 DIAGNOSIS — Z9842 Cataract extraction status, left eye: Secondary | ICD-10-CM

## 2020-07-13 DIAGNOSIS — W19XXXA Unspecified fall, initial encounter: Secondary | ICD-10-CM | POA: Diagnosis not present

## 2020-07-13 DIAGNOSIS — Z681 Body mass index (BMI) 19 or less, adult: Secondary | ICD-10-CM | POA: Diagnosis not present

## 2020-07-13 DIAGNOSIS — E039 Hypothyroidism, unspecified: Secondary | ICD-10-CM | POA: Diagnosis present

## 2020-07-13 DIAGNOSIS — Z90710 Acquired absence of both cervix and uterus: Secondary | ICD-10-CM

## 2020-07-13 DIAGNOSIS — Z809 Family history of malignant neoplasm, unspecified: Secondary | ICD-10-CM

## 2020-07-13 DIAGNOSIS — E441 Mild protein-calorie malnutrition: Secondary | ICD-10-CM | POA: Diagnosis present

## 2020-07-13 DIAGNOSIS — J449 Chronic obstructive pulmonary disease, unspecified: Secondary | ICD-10-CM | POA: Diagnosis present

## 2020-07-13 DIAGNOSIS — Z9049 Acquired absence of other specified parts of digestive tract: Secondary | ICD-10-CM

## 2020-07-13 DIAGNOSIS — Z79899 Other long term (current) drug therapy: Secondary | ICD-10-CM

## 2020-07-13 DIAGNOSIS — Z833 Family history of diabetes mellitus: Secondary | ICD-10-CM

## 2020-07-13 DIAGNOSIS — I252 Old myocardial infarction: Secondary | ICD-10-CM

## 2020-07-13 DIAGNOSIS — Z8249 Family history of ischemic heart disease and other diseases of the circulatory system: Secondary | ICD-10-CM

## 2020-07-13 DIAGNOSIS — S2239XA Fracture of one rib, unspecified side, initial encounter for closed fracture: Secondary | ICD-10-CM

## 2020-07-13 DIAGNOSIS — Z9841 Cataract extraction status, right eye: Secondary | ICD-10-CM

## 2020-07-13 LAB — COMPREHENSIVE METABOLIC PANEL
ALT: 12 U/L (ref 0–44)
AST: 25 U/L (ref 15–41)
Albumin: 2.8 g/dL — ABNORMAL LOW (ref 3.5–5.0)
Alkaline Phosphatase: 42 U/L (ref 38–126)
Anion gap: 15 (ref 5–15)
BUN: 15 mg/dL (ref 8–23)
CO2: 25 mmol/L (ref 22–32)
Calcium: 9 mg/dL (ref 8.9–10.3)
Chloride: 102 mmol/L (ref 98–111)
Creatinine, Ser: 1.42 mg/dL — ABNORMAL HIGH (ref 0.44–1.00)
GFR, Estimated: 38 mL/min — ABNORMAL LOW (ref >60)
Glucose, Bld: 112 mg/dL — ABNORMAL HIGH (ref 70–99)
Potassium: 3.9 mmol/L (ref 3.5–5.1)
Sodium: 142 mmol/L (ref 135–145)
Total Bilirubin: 0.9 mg/dL (ref 0.3–1.2)
Total Protein: 6.6 g/dL (ref 6.5–8.1)

## 2020-07-13 LAB — CBC WITH DIFFERENTIAL/PLATELET
Abs Immature Granulocytes: 0 10*3/uL (ref 0.00–0.07)
Basophils Absolute: 0.1 10*3/uL (ref 0.0–0.1)
Basophils Relative: 1 %
Eosinophils Absolute: 0 10*3/uL (ref 0.0–0.5)
Eosinophils Relative: 0 %
HCT: 40.6 % (ref 36.0–46.0)
Hemoglobin: 12 g/dL (ref 12.0–15.0)
Lymphocytes Relative: 10 %
Lymphs Abs: 0.9 10*3/uL (ref 0.7–4.0)
MCH: 26.7 pg (ref 26.0–34.0)
MCHC: 29.6 g/dL — ABNORMAL LOW (ref 30.0–36.0)
MCV: 90.4 fL (ref 80.0–100.0)
Monocytes Absolute: 0.7 10*3/uL (ref 0.1–1.0)
Monocytes Relative: 8 %
Neutro Abs: 7.2 10*3/uL (ref 1.7–7.7)
Neutrophils Relative %: 81 %
Platelets: 214 10*3/uL (ref 150–400)
RBC: 4.49 MIL/uL (ref 3.87–5.11)
RDW: 14.9 % (ref 11.5–15.5)
WBC: 8.9 10*3/uL (ref 4.0–10.5)
nRBC: 0 % (ref 0.0–0.2)
nRBC: 0 /100 WBC

## 2020-07-13 LAB — LACTIC ACID, PLASMA: Lactic Acid, Venous: 2.1 mmol/L (ref 0.5–1.9)

## 2020-07-13 LAB — RESP PANEL BY RT-PCR (FLU A&B, COVID) ARPGX2
Influenza A by PCR: NEGATIVE
Influenza B by PCR: NEGATIVE
SARS Coronavirus 2 by RT PCR: NEGATIVE

## 2020-07-13 LAB — CK: Total CK: 221 U/L (ref 38–234)

## 2020-07-13 MED ORDER — DONEPEZIL HCL 5 MG PO TABS
5.0000 mg | ORAL_TABLET | Freq: Every day | ORAL | Status: DC
  Administered 2020-07-14 – 2020-07-21 (×9): 5 mg via ORAL
  Filled 2020-07-13 (×10): qty 1

## 2020-07-13 MED ORDER — ARIPIPRAZOLE 2 MG PO TABS
2.0000 mg | ORAL_TABLET | Freq: Every day | ORAL | Status: DC
  Administered 2020-07-14 – 2020-07-22 (×9): 2 mg via ORAL
  Filled 2020-07-13 (×9): qty 1

## 2020-07-13 MED ORDER — LACTATED RINGERS IV BOLUS
500.0000 mL | Freq: Once | INTRAVENOUS | Status: AC
  Administered 2020-07-13: 500 mL via INTRAVENOUS

## 2020-07-13 MED ORDER — ALPRAZOLAM 0.25 MG PO TABS: 0.2500 mg | ORAL_TABLET | Freq: Three times a day (TID) | ORAL | Status: DC | PRN

## 2020-07-13 MED ORDER — ACETAMINOPHEN 650 MG RE SUPP: 650.0000 mg | Freq: Four times a day (QID) | RECTAL | Status: DC | PRN

## 2020-07-13 MED ORDER — LEVOTHYROXINE SODIUM 25 MCG PO TABS
25.0000 ug | ORAL_TABLET | Freq: Every day | ORAL | Status: DC
  Administered 2020-07-14 – 2020-07-22 (×9): 25 ug via ORAL
  Filled 2020-07-13 (×9): qty 1

## 2020-07-13 MED ORDER — ALBUTEROL SULFATE HFA 108 (90 BASE) MCG/ACT IN AERS: 2.0000 | INHALATION_SPRAY | Freq: Four times a day (QID) | RESPIRATORY_TRACT | Status: DC | PRN

## 2020-07-13 MED ORDER — ACETAMINOPHEN 500 MG PO TABS
1000.0000 mg | ORAL_TABLET | Freq: Once | ORAL | Status: AC
  Administered 2020-07-13: 1000 mg via ORAL
  Filled 2020-07-13: qty 2

## 2020-07-13 MED ORDER — BUSPIRONE HCL 10 MG PO TABS
30.0000 mg | ORAL_TABLET | Freq: Three times a day (TID) | ORAL | Status: DC
  Administered 2020-07-14 – 2020-07-22 (×26): 30 mg via ORAL
  Filled 2020-07-13 (×21): qty 3
  Filled 2020-07-13: qty 6
  Filled 2020-07-13 (×2): qty 3
  Filled 2020-07-13: qty 6
  Filled 2020-07-13: qty 3
  Filled 2020-07-13: qty 6

## 2020-07-13 MED ORDER — MORPHINE SULFATE (PF) 2 MG/ML IV SOLN: 2.0000 mg | INTRAVENOUS | Status: DC | PRN

## 2020-07-13 MED ORDER — TRAZODONE HCL 100 MG PO TABS
100.0000 mg | ORAL_TABLET | Freq: Every evening | ORAL | Status: DC | PRN
  Administered 2020-07-15 – 2020-07-21 (×6): 100 mg via ORAL
  Filled 2020-07-13 (×6): qty 1

## 2020-07-13 MED ORDER — PANTOPRAZOLE SODIUM 40 MG PO TBEC
40.0000 mg | DELAYED_RELEASE_TABLET | Freq: Every day | ORAL | Status: DC
  Administered 2020-07-14 – 2020-07-22 (×9): 40 mg via ORAL
  Filled 2020-07-13 (×8): qty 1

## 2020-07-13 MED ORDER — BUPROPION HCL ER (XL) 150 MG PO TB24
450.0000 mg | ORAL_TABLET | Freq: Every day | ORAL | Status: DC
  Administered 2020-07-14 – 2020-07-22 (×9): 450 mg via ORAL
  Filled 2020-07-13 (×9): qty 3

## 2020-07-13 MED ORDER — ONDANSETRON HCL 4 MG PO TABS: 4.0000 mg | ORAL_TABLET | Freq: Four times a day (QID) | ORAL | Status: DC | PRN

## 2020-07-13 MED ORDER — SODIUM CHLORIDE 0.9 % IV BOLUS
500.0000 mL | Freq: Once | INTRAVENOUS | Status: AC
  Administered 2020-07-13: 500 mL via INTRAVENOUS

## 2020-07-13 MED ORDER — TAMSULOSIN HCL 0.4 MG PO CAPS
0.4000 mg | ORAL_CAPSULE | Freq: Every day | ORAL | Status: DC
  Administered 2020-07-14 – 2020-07-22 (×9): 0.4 mg via ORAL
  Filled 2020-07-13 (×9): qty 1

## 2020-07-13 MED ORDER — HEPARIN SODIUM (PORCINE) 5000 UNIT/ML IJ SOLN
5000.0000 [IU] | Freq: Three times a day (TID) | INTRAMUSCULAR | Status: DC
  Administered 2020-07-14 – 2020-07-22 (×25): 5000 [IU] via SUBCUTANEOUS
  Filled 2020-07-13 (×26): qty 1

## 2020-07-13 MED ORDER — ACETAMINOPHEN 325 MG PO TABS
650.0000 mg | ORAL_TABLET | Freq: Four times a day (QID) | ORAL | Status: DC | PRN
  Administered 2020-07-18 – 2020-07-19 (×2): 650 mg via ORAL
  Filled 2020-07-13 (×3): qty 2

## 2020-07-13 MED ORDER — POLYETHYLENE GLYCOL 3350 17 G PO PACK: 17.0000 g | PACK | Freq: Every day | ORAL | Status: DC | PRN

## 2020-07-13 MED ORDER — ENSURE ENLIVE PO LIQD
237.0000 mL | Freq: Two times a day (BID) | ORAL | Status: DC
  Administered 2020-07-14 – 2020-07-22 (×5): 237 mL via ORAL

## 2020-07-13 MED ORDER — OXYCODONE HCL 5 MG PO TABS: 15.0000 mg | ORAL_TABLET | Freq: Four times a day (QID) | ORAL | Status: DC

## 2020-07-13 MED ORDER — LIDOCAINE 5 % EX PTCH
1.0000 | MEDICATED_PATCH | CUTANEOUS | Status: DC
  Administered 2020-07-13 – 2020-07-21 (×9): 1 via TRANSDERMAL
  Filled 2020-07-13 (×9): qty 1

## 2020-07-13 MED ORDER — ONDANSETRON HCL 4 MG/2ML IJ SOLN: 4.0000 mg | Freq: Four times a day (QID) | INTRAMUSCULAR | Status: DC | PRN

## 2020-07-13 MED ORDER — SODIUM CHLORIDE 0.9 % IV SOLN: INTRAVENOUS | Status: DC

## 2020-07-13 MED ORDER — OXYCODONE HCL 5 MG PO TABS
15.0000 mg | ORAL_TABLET | Freq: Once | ORAL | Status: AC
  Administered 2020-07-13: 15 mg via ORAL
  Filled 2020-07-13: qty 3

## 2020-07-13 MED ORDER — FOLIC ACID 1 MG PO TABS
1.0000 mg | ORAL_TABLET | Freq: Every day | ORAL | Status: DC
  Administered 2020-07-14 – 2020-07-21 (×9): 1 mg via ORAL
  Filled 2020-07-13 (×10): qty 1

## 2020-07-13 MED ORDER — ADULT MULTIVITAMIN W/MINERALS CH
1.0000 | ORAL_TABLET | Freq: Every day | ORAL | Status: DC
  Administered 2020-07-14 – 2020-07-22 (×9): 1 via ORAL
  Filled 2020-07-13 (×9): qty 1

## 2020-07-13 NOTE — ED Notes (Signed)
This RN went in to stop IV bolus of LR, found pt had pulled out IV. Site clean, bleeding controlled, IV catheter intact. 20g placed R bicep

## 2020-07-13 NOTE — ED Provider Notes (Signed)
[facility] EMERGENCY DEPARTMENT Provider Note   CSN: [id number] Arrival date & time: 07/13/20  1613     History Chief Complaint  Patient presents with  . Chest Pain    Traci [name] is a 74 y.o. female.  Patient is a 74 year old female with a history of COPD, chronic pain on daily oxycodone, hypertension, hyperlipidemia, aortic insufficiency, CHF who is presenting today with complaint of left-sided chest pain.  Patient reports the pain started after a fall last night.  She was getting up from her chair when she reported she stumbled causing her to fall and hit her left chest and arm on the wooden arm of another chair.  She then fell to the floor.  Her husband had to help her off of the floor and since that time she has had pain in her left chest.  She reports today the pain has become more severe.  She is mildly nauseated but denies any vomiting.  It is worse with taking a deep breath but does not radiate.  She did hit her arm and has some bruises over the left arm and a skin tear on her left hand but denies significant pain.  She normally gets around her home by using a walker but has not tried to get up today.  She has not taken her chronic pain medication today either and reports she has not taken any of her medication and has not eaten.  She denies any abdominal pain and she has had no diarrhea.  She reports that she felt her normal self yesterday and was fine until the fall.  She had not been experiencing chest pain or shortness of breath yesterday afternoon before these events.  She did not hit her head did not have loss of consciousness and does not take anticoagulation.  She denies feeling lightheaded or having prodromal symptoms when standing.  The history is provided by the patient.  Chest Pain Pain location:  L chest Pain quality: sharp and shooting   Pain radiates to:  Does not radiate Pain severity:  Moderate Onset quality:  Sudden Duration:  1 day Timing:   Constant Progression:  Unchanged Chronicity:  New Context: trauma   Relieved by: better with rest. Worsened by:  Deep breathing and movement Ineffective treatments:  None tried Associated symptoms: nausea   Associated symptoms: no abdominal pain, no back pain, no cough, no fever, no headache, no lower extremity edema, no shortness of breath and no vomiting        Past Medical History:  Diagnosis Date  . Anginal pain ([place])   . Anxiety   . Arthritis   . Chronic bronchitis ([place])   . Chronic lower back pain   . COPD (chronic obstructive pulmonary disease) ([place])   . Depression   . Dyspepsia   . GERD (gastroesophageal reflux disease)   . H/O hiatal hernia   . SKAJGOTL(572.6)    "1-2/week" (08/16/2016)  . History of blood transfusion 1963   w/childbirth  . Hyperlipidemia   . Hypertension   . Hypothyroidism   . Shortness of breath 02/12/12   "all the time"  . Tremor     Patient Active Problem List   Diagnosis Date Noted  . Sacral fracture, closed ([place])   . Hip fracture ([place]) 11/03/2018  . AKI (acute kidney injury) ([place]) 07/23/2018  . Fall 07/23/2018  . Abdominal pain   . Contusion of left chest wall   . Severe aortic insufficiency 02/04/2018  .  Tremor   . Hypertension   . Hyperlipidemia   . H/O hiatal hernia   . GERD (gastroesophageal reflux disease)   . Dyspepsia   . Depression   . COPD (chronic obstructive pulmonary disease) ([place])   . Chronic lower back pain   . Chronic bronchitis ([place])   . Arthritis   . Anxiety   . Anginal pain ([place])   . DNR (do not resuscitate) 02/13/2017  . Shortness of breath 09/14/2016  . 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 Acute on chronic diastolic CHF (congestive heart failure), NYHA class 3 ([place])   . CHF (congestive heart failure) ([place]) 08/16/2016  . Anemia 08/16/2016  . Chronic back pain 08/16/2016  . Hypothyroidism 08/16/2016  . Benign essential tremor 08/16/2016  . Acute respiratory failure with hypoxia  ([place]) 04/01/2014  . Sinus tachycardia 03/31/2014  . Fractured tibia and fibula 03/26/2014  . Right tibial fracture 03/26/2014  . Closed right tibial fracture 03/26/2014  . Unstable gait 03/26/2014  . Chest pain syndrome 02/12/2012  . Spinal stenosis 09/24/2011  . HEARTBURN 08/03/2010  . BENIGN NEOPLASM OF ADRENAL GLAND 07/28/2010  . Anxiety disorder 07/28/2010  . MYOCARDIAL INFARCTION, HX OF 07/28/2010  . Chronic obstructive pulmonary disease ([place]) 07/28/2010  . DIVERTICULOSIS, COLON 07/28/2010  . COLONIC POLYPS, ADENOMATOUS, HX OF 07/28/2010  . HLD (hyperlipidemia) 06/23/2010  . HTN (hypertension) 06/23/2010  . ACUTE DILATATION OF STOMACH 06/23/2010  . ABDOMINAL PAIN, GENERALIZED 06/23/2010  . History of blood transfusion 08/20/1961    Past Surgical History:  Procedure Laterality Date  . BACK SURGERY    . CARDIAC CATHETERIZATION  04/19/1995   EF 65-70%  . CATARACT EXTRACTION W/ INTRAOCULAR LENS  IMPLANT, BILATERAL  2012  . FRACTURE SURGERY    . LAPAROSCOPIC CHOLECYSTECTOMY  2010  . LEFT HEART CATHETERIZATION WITH CORONARY ANGIOGRAM N/A 02/13/2012   Procedure: LEFT HEART CATHETERIZATION WITH CORONARY ANGIOGRAM;  Surgeon: Thayer Headings, MD;  Location: [facility] CATH LAB;  Service: Cardiovascular;  Laterality: N/A;  . LUMBAR DISC SURGERY  early 2000's  . TEE WITHOUT CARDIOVERSION N/A 08/22/2016   Procedure: TRANSESOPHAGEAL ECHOCARDIOGRAM (TEE);  Surgeon: Sanda Klein, MD;  Location: [facility];  Service: Cardiovascular;  Laterality: N/A;  . TIBIA IM NAIL INSERTION Right 03/30/2014   Procedure: INTRAMEDULLARY (IM) NAIL TIBIAL;  Surgeon: Wylene Simmer, MD;  Location: [facility];  Service: Orthopedics;  Laterality: Right;  . US ECHOCARDIOGRAPHY  12/17/2007   EF 55-60%  . VAGINAL HYSTERECTOMY  1980's     OB History   No obstetric history on file.     Family History  Problem Relation Age of Onset  . Heart attack Father   . Obesity Sister   . Emphysema Sister   . Heart disease Brother     . Heart disease Daughter   . Diabetes Daughter   . Cancer Daughter        female cancer    Social History   Tobacco Use  . Smoking status: Former Smoker    Packs/day: 0.10    Years: 55.00    Pack years: 5.50    Types: Cigarettes    Quit date: 08/19/2016    Years since quitting: 3.9  . Smokeless tobacco: Never Used  . Tobacco comment: Encouraged to remain smoke free  Vaping Use  . Vaping Use: Never used  Substance Use Topics  . Alcohol use: No    Alcohol/week: 0.0 standard drinks  . Drug use: No    Home Medications  Prior to Admission medications   Medication Sig Start Date End Date Taking? Authorizing Provider  albuterol (PROVENTIL HFA;VENTOLIN HFA) 108 (90 Base) MCG/ACT inhaler Inhale 2 puffs into the lungs every 6 (six) hours as needed for wheezing or shortness of breath. 01/22/17   Mannam, Hart Robinsons, MD  ALPRAZolam (XANAX) 0.5 MG tablet Take 0.5 mg by mouth 3 (three) times daily as needed.     [provider]  ARIPiprazole (ABILIFY) 2 MG tablet Take 2 mg by mouth daily.     [provider]  buPROPion (WELLBUTRIN XL) 150 MG 24 hr tablet Take 450 mg by mouth daily.     [provider]  busPIRone (BUSPAR) 30 MG tablet Take 30 mg by mouth 3 (three) times daily.     [provider]  donepezil (ARICEPT) 5 MG tablet Take 5 mg by mouth at bedtime.    [provider]  folic acid (FOLVITE) 1 MG tablet Take 1 tablet (1 mg total) by mouth daily. 08/26/16   Elgergawy, Silver Huguenin, MD  levothyroxine (SYNTHROID, LEVOTHROID) 25 MCG tablet Take 25 mcg by mouth daily before breakfast.  08/31/14   [provider]  omeprazole (PRILOSEC) 20 MG capsule Take 20 mg by mouth daily.    [provider]  oxyCODONE (ROXICODONE) 15 MG immediate release tablet Take 1 tablet (15 mg total) by mouth every 6 (six) hours. 11/06/18   Benay Pike, MD  OXYGEN Inhale 2 L into the lungs as needed.    [provider]  tamsulosin (FLOMAX) 0.4 MG CAPS  capsule Take 1 capsule (0.4 mg total) by mouth daily. 11/07/18   Matilde Haymaker, MD  traZODone (DESYREL) 150 MG tablet Take 300 mg by mouth at bedtime as needed for sleep.  08/31/14   [provider]    Allergies    Cephalexin, Aspirin, Cymbalta [duloxetine hcl], and Latex  Review of Systems   Review of Systems  Constitutional: Negative for fever.  Respiratory: Negative for cough and shortness of breath.   Cardiovascular: Positive for chest pain.  Gastrointestinal: Positive for nausea. Negative for abdominal pain and vomiting.  Musculoskeletal: Negative for back pain.  Neurological: Negative for headaches.  All other systems reviewed and are negative.   Physical Exam Updated Vital Signs BP 108/66 (BP Location: Right Arm)   Pulse (!) 102   Temp 99.1 F (37.3 C) (Oral)   Resp (!) 24   SpO2 93%   Physical Exam Vitals and nursing note reviewed.  Constitutional:      General: She is not in acute distress.    Appearance: Normal appearance. She is well-developed and normal weight.  HENT:     Head: Normocephalic and atraumatic.     Nose: Nose normal.  Eyes:     Pupils: Pupils are equal, round, and reactive to light.  Cardiovascular:     Rate and Rhythm: Normal rate and regular rhythm.     Pulses: Normal pulses.     Heart sounds: Normal heart sounds. No murmur heard.  No friction rub.  Pulmonary:     Effort: Pulmonary effort is normal.     Breath sounds: Normal breath sounds. No wheezing or rales.  Chest:     Chest wall: Tenderness present.     Breasts:        Left: Normal.    Abdominal:     General: Bowel sounds are normal. There is no distension.     Palpations: Abdomen is soft.     Tenderness: There  is no abdominal tenderness. There is no right CVA tenderness, left CVA tenderness, guarding or rebound.  Musculoskeletal:        General: No tenderness. Normal range of motion.     Left shoulder: Normal.     Left elbow: Normal.     Left forearm: No tenderness or  bony tenderness.     Left hand: No tenderness or bony tenderness. Normal range of motion.       Arms:       Hands:     Cervical back: Normal and normal range of motion. No rigidity or tenderness.     Thoracic back: Normal.     Lumbar back: Normal.     Right hip: Normal.     Left hip: Normal.     Right knee: Normal.     Left knee: Normal.     Right ankle: Normal.     Left ankle: Normal.       Legs:     Comments: No edema  Skin:    General: Skin is warm and dry.     Capillary Refill: Capillary refill takes less than 2 seconds.     Findings: No rash.  Neurological:     General: No focal deficit present.     Mental Status: She is alert. Mental status is at baseline.     Cranial Nerves: No cranial nerve deficit.     Sensory: No sensory deficit.     Motor: No weakness.     Comments: Oriented to person and place.  Resting tremor noted in the right arm  Psychiatric:        Mood and Affect: Mood normal.        Behavior: Behavior normal.        Thought Content: Thought content normal.     ED Results / Procedures / Treatments   Labs (all labs ordered are listed, but only abnormal results are displayed) Labs Reviewed  CBC WITH DIFFERENTIAL/PLATELET - Abnormal; Notable for the following components:      Result Value   MCHC 29.6 (*)    All other components within normal limits  COMPREHENSIVE METABOLIC PANEL - Abnormal; Notable for the following components:   Glucose, Bld 112 (*)    Creatinine, Ser 1.42 (*)    Albumin 2.8 (*)    GFR, Estimated 38 (*)    All other components within normal limits  LACTIC ACID, PLASMA - Abnormal; Notable for the following components:   Lactic Acid, Venous 2.1 (*)    All other components within normal limits  CK  URINALYSIS, ROUTINE W REFLEX MICROSCOPIC    EKG EKG Interpretation  Date/Time:  Wednesday July 13 2020 16:22:34 EST Ventricular Rate:  104 PR Interval:    QRS Duration: 92 QT Interval:  342 QTC Calculation: 450 R  Axis:   66 Text Interpretation: Sinus tachycardia new Borderline repolarization abnormality Confirmed by Blanchie Dessert ([id number]) on 07/13/2020 5:33:33 PM   Radiology DG Ribs Unilateral W/Chest Left  Result Date: 07/13/2020 CLINICAL DATA:  Status post fall with left-sided rib pain. EXAM: LEFT RIBS AND CHEST - 3+ VIEW COMPARISON:  July 23, 2018 FINDINGS: The lungs are hyperinflated. Moderate severity emphysematous lung disease is seen. Mild diffuse chronic appearing increased interstitial lung markings are noted. Mild atelectasis and/or infiltrate is also seen within the right upper lobe. There is no evidence of a pleural effusion or pneumothorax. A stable 6 mm calcified lung nodule is seen within the mid to upper left lung. Nondisplaced  seventh and eighth left rib fractures are seen. There is no evidence of pneumothorax or pleural effusion. Heart size and mediastinal contours are within normal limits. Radiopaque surgical clips are seen within the right upper quadrant. IMPRESSION: 1. COPD and chronic interstitial lung disease with mild right upper lobe atelectasis and/or infiltrate. 2. Nondisplaced seventh and eighth left rib fractures. Electronically Signed   By: Virgina Norfolk M.D.   On: 07/13/2020 18:19    Procedures Procedures (including critical care time)  Medications Ordered in ED Medications  oxyCODONE (Oxy IR/ROXICODONE) immediate release tablet 15 mg (has no administration in time range)  acetaminophen (TYLENOL) tablet 1,000 mg (has no administration in time range)  lidocaine (LIDODERM) 5 % 1 patch (has no administration in time range)    ED Course  I have reviewed the triage vital signs and the nursing notes.  Pertinent labs & imaging results that were available during my care of the patient were reviewed by me and considered in my medical decision making (see chart for details).    MDM Rules/Calculators/A&P                          Patient is a 74 year old female  presenting today with ongoing left chest pain after a fall yesterday.  She had no preceding symptoms prior to the fall to suggest an acute cardiac event.  She was feeling normal but reports when she stood her legs got tangled.  She denies hitting her head or loss of consciousness.  However she has had ongoing left chest pain.  Patient is mildly tachycardic here with stable blood pressure.  She has localized tenderness with palpation to her chest.  She has no significant chest wall bruising.  Breath sounds are clear bilaterally and oxygen saturation is 94% on room air.  Patient has no wheezing and breath sounds are equal bilaterally with low concern for pneumothorax.  Patient has no back or leg pain.  Low suspicion for other injury.  Able to fully range both shoulder, elbow and wrist on the left with low suspicion for bony injury.  Patient does take 15 mg of immediate release oxycodone daily and reports she has not had her dose today.  She was given a dose of her medication.  Plain films are pending.  We will try to get a hold of family to find out if there is anything else going on at home.  7:41 PM Spoke with patient's daughter and she gives a little bit different history.  Daughter reports that patient has been wheelchair-bound for a while now but is able to transfer from her bed to the wheelchair.  Patient has been having gradual decline at home but in the last few weeks its been more rapid.  Daughter reports that patient refuses to eat and they are trying to force her to eat but she just lays in her bed.  She has no home health services at this time and they have been trying to place her in a skilled facility but are still waiting for openings.  Last night patient was leaned over in her bed and then when her husband checked on her later she was on the floor.  Daughter reports she laid on the floor for hours before they got someone in the morning to help get her off the ground.  Patient's pain is improved  after her home dose of oxycodone here however she became hypotensive and hypoxic.  With patient's rib fracture  she is most likely going to need to take the chronic pain medication but that has worsened her mobility and now she cannot even get out of bed on her own.  Lactic acid is mildly elevated at 2.1, CK is normal patient does have some mild AKI with creatinine of 1.42.  She was given 2 L of fluid and blood pressure did improve to the low 90s.  Chest x-ray does show left seventh and eighth rib fracture.  Feel that patient needs admission for pain control, hydration and PT evaluation.  MDM Number of Diagnoses or Management Options   Amount and/or Complexity of Data Reviewed Clinical lab tests: ordered and reviewed Tests in the radiology section of CPT: ordered and reviewed Tests in the medicine section of CPT: ordered and reviewed Decide to obtain previous medical records or to obtain history from someone other than the patient: yes Obtain history from someone other than the patient: yes Review and summarize past medical records: yes Discuss the patient with other providers: yes Independent visualization of images, tracings, or specimens: yes  Risk of Complications, Morbidity, and/or Mortality Presenting problems: moderate Diagnostic procedures: low Management options: low  Patient Progress Patient progress: improved   Final Clinical Impression(s) / ED Diagnoses Final diagnoses:  Fall, initial encounter  Closed fracture of multiple ribs of left side, initial encounter  Hypotension, unspecified hypotension type  Hypoxia    Rx / DC Orders ED Discharge Orders    None       Blanchie Dessert, MD 07/13/20 1953

## 2020-07-13 NOTE — H&P (Signed)
TRH H&P    Patient Demographics:    Traci [name], is a 74 y.o. female  MRN: [id number]  DOB - 1945/10/18  Admit Date - 07/13/2020  Referring MD/NP/PA: Maryan Rued  Outpatient Primary MD for the patient is Hague, Rosalyn Charters, MD  Patient coming from: Home  Chief complaint- Fall, chest wall pain   HPI:    Traci [name]  is a 74 y.o. female, with history of hypothyroidism, hypertension, hyperlipidemia, GERD, COPD, chronic lower back pain, anxiety, and more presents to the ER with a chief complaint of fall.  Patient reports that she fell during the night between 11/23 and 11/24.  Patient reports that she was attempting to transfer from chair to her wheelchair, and fell to the ground because she did not reach the wheelchair.  Patient reports that her bilateral arms are hurting since the fall, as well as chest wall pain.  Her chest wall pains on the right side.  She reports she did not hit her head, did not lose consciousness.  She reports no preceding symptoms.  Family provides extra history.  They report that she has been declining over weeks, not eating and not drinking.  Patient does admit that she has not been eating or drinking very much.  She reports that when she feels thirsty she takes a sip of water and then does not feel thirsty again for the rest of the day.  She reports that she is eating sometimes 1 meal a day, sometimes not even that.  She reports this because she has no appetite.  Family reports that due to this she has been getting weaker and weaker.  She lives at home with her husband, he went to check on her and saw her hunched over on the bed in her room.  He went to check on her later and saw her on the ground.  It was late at night so he would waited several hours to call for help for someone to come get her up off the ground.  When they did get her up she was complaining of pain and that is why they came into  the ER.  Patient reports that she has had no dysuria, no nausea vomiting.  She does report a loose bowel movement each day that she eats breakfast, the bowel movement occurs right after breakfast.  She reports no melena, no hematochezia.  She has not discussed this with her primary care physician.  Patient reports that she does feel short of breath at baseline.  She has as needed oxygen at 3 L at home.  She reports that even though she has been feeling short of breath she has not been wearing her oxygen because she thinks she only needs it when she is up doing something, and she has not been up doing anything.  Patient does not notice an increase shortness of breath with her pain medications.  In the ER after given her home dose of her pain medication, patient became hypotensive and hypoxic.  In the ED Temperature nine 9.1, heart rate  89 220, respiratory rate 16-32, blood pressure 84/30 4-1 108/66 No leukocytosis with a [place]i blood cell count of 8.9, hemoglobin 12.0 Chemistry panel reveals an AKI with a creatinine of 1.42 (baseline 1.06) Patient had a CPK of 221, lactic acid of 2.1 Chest x-ray showed COPD and chronic interstitial lung disease with atelectasis versus infiltrate in the right upper lobe -favor atelectasis since patient has not had a cough or fever She also had nondisplaced seventh and eighth rib fractures EKG shows a heart rate of 104 sinus tachycardia, QTC of 450 no acute ischemic changes UA pending 500 mL bolus of LR Oxycodone 15 immediate release Lidoderm patch Admission requested for PT eval, patient may need placement, and hydration in setting of AKI    Review of systems:    In addition to the HPI above,  No Fever-chills, No Headache, No changes with Vision or hearing, No problems swallowing food or Liquids, Right chest wall pain since fall no cough but admits to chronic shortness of Breath, No Abdominal pain, No Nausea or Vomiting, bowel movements are regular, No Blood  in stool or Urine, No dysuria, No new skin rashes or bruises, Reports new aches in both arms status post fall No new weakness, tingling, numbness in any extremity, Reports recent weight loss secondary to poor p.o. intake secondary to poor appetite no polyuria, polydypsia or polyphagia, No significant Mental Stressors.  All other systems reviewed and are negative.    Past History of the following :    Past Medical History:  Diagnosis Date  . Anginal pain ([place])   . Anxiety   . Arthritis   . Chronic bronchitis ([place])   . Chronic lower back pain   . COPD (chronic obstructive pulmonary disease) ([place])   . Depression   . Dyspepsia   . GERD (gastroesophageal reflux disease)   . H/O hiatal hernia   . JMEQASTM(196.2)    "1-2/week" (08/16/2016)  . History of blood transfusion 1963   w/childbirth  . Hyperlipidemia   . Hypertension   . Hypothyroidism   . Shortness of breath 02/12/12   "all the time"  . Tremor       Past Surgical History:  Procedure Laterality Date  . BACK SURGERY    . CARDIAC CATHETERIZATION  04/19/1995   EF 65-70%  . CATARACT EXTRACTION W/ INTRAOCULAR LENS  IMPLANT, BILATERAL  2012  . FRACTURE SURGERY    . LAPAROSCOPIC CHOLECYSTECTOMY  2010  . LEFT HEART CATHETERIZATION WITH CORONARY ANGIOGRAM N/A 02/13/2012   Procedure: LEFT HEART CATHETERIZATION WITH CORONARY ANGIOGRAM;  Surgeon: Thayer Headings, MD;  Location: [facility]ATH [facility];  Service: Cardiovascular;  Laterality: N/A;  . LUMBAR DISC SURGERY  early 2000's  . TEE WITHOUT CARDIOVERSION N/A 08/22/2016   Procedure: TRANSESOPHAGEAL ECHOCARDIOGRAM (TEE);  Surgeon: Sanda Klein, MD;  Location: [facility];  Service: Cardiovascular;  Laterality: N/A;  . TIBIA IM NAIL INSERTION Right 03/30/2014   Procedure: INTRAMEDULLARY (IM) NAIL TIBIAL;  Surgeon: Wylene Simmer, MD;  Location: [facility];  Service: Orthopedics;  Laterality: Right;  . US ECHOCARDIOGRAPHY  12/17/2007   EF 55-60%  . VAGINAL HYSTERECTOMY  1980's      Social  History:      Social History   Tobacco Use  . Smoking status: Former Smoker    Packs/day: 0.10    Years: 55.00    Pack years: 5.50    Types: Cigarettes    Quit date: 08/19/2016    Years since quitting: 3.9  . Smokeless tobacco: Never  Used  . Tobacco comment: Encouraged to remain smoke free  Substance Use Topics  . Alcohol use: No    Alcohol/week: 0.0 standard drinks       Family History :     Family History  Problem Relation Age of Onset  . Heart attack Father   . Obesity Sister   . Emphysema Sister   . Heart disease Brother   . Heart disease Daughter   . Diabetes Daughter   . Cancer Daughter        female cancer      Home Medications:   Prior to Admission medications   Medication Sig Start Date End Date Taking? Authorizing Provider  albuterol (PROVENTIL HFA;VENTOLIN HFA) 108 (90 Base) MCG/ACT inhaler Inhale 2 puffs into the lungs every 6 (six) hours as needed for wheezing or shortness of breath. 01/22/17  Yes Mannam, Praveen, MD  ALPRAZolam (XANAX) 0.5 MG tablet Take 0.5 mg by mouth 3 (three) times daily as needed for anxiety or sleep.    Yes [provider]  ARIPiprazole (ABILIFY) 2 MG tablet Take 2 mg by mouth daily.    Yes [provider]  buPROPion (WELLBUTRIN XL) 150 MG 24 hr tablet Take 450 mg by mouth daily.    Yes [provider]  busPIRone (BUSPAR) 30 MG tablet Take 30 mg by mouth 3 (three) times daily.    Yes [provider]  donepezil (ARICEPT) 5 MG tablet Take 5 mg by mouth at bedtime.   Yes [provider]  folic acid (FOLVITE) 1 MG tablet Take 1 tablet (1 mg total) by mouth daily. Patient taking differently: Take 1 mg by mouth at bedtime.  08/26/16  Yes Elgergawy, Silver Huguenin, MD  levothyroxine (SYNTHROID, LEVOTHROID) 25 MCG tablet Take 25 mcg by mouth daily before breakfast.  08/31/14  Yes [provider]  omeprazole (PRILOSEC) 20 MG capsule Take 20 mg by mouth daily.   Yes [provider]    oxyCODONE (ROXICODONE) 15 MG immediate release tablet Take 1 tablet (15 mg total) by mouth every 6 (six) hours. 11/06/18  Yes Benay Pike, MD  tamsulosin (FLOMAX) 0.4 MG CAPS capsule Take 1 capsule (0.4 mg total) by mouth daily. 11/07/18  Yes Matilde Haymaker, MD  traZODone (DESYREL) 150 MG tablet Take 300 mg by mouth at bedtime as needed for sleep.  08/31/14  Yes [provider]  OXYGEN Inhale 2 L into the lungs as needed.    [provider]     Allergies:     Allergies  Allergen Reactions  . Cephalexin Hives  . Aspirin Other (See Comments)    Upset GI  . Cymbalta [Duloxetine Hcl] Other (See Comments)    Sees spiders  . Latex     " MAKES ME ITCHY "      Physical Exam:   Vitals  Blood pressure (!) 87/46, pulse 90, temperature 98.8 F (37.1 C), temperature source Oral, resp. rate 17, SpO2 99 %.  1.  General: Patient lying supine in bed with head of bed elevated  2. Psychiatric: Patient anxious about staying in the hospital, behavior is normal for situation, cooperative with exam, alert and oriented x3  3. Neurologic: Cranial nerves II through XII are grossly intact, moves all 4 extremities voluntarily, generalized weakness, but no asymmetrical weakness, speech and language are normal  4. HEENMT:  Head is atraumatic, normocephalic, pupils are reactive to light, mucous membranes are dry, neck is supple, trachea is midline  5. Respiratory :  Lungs are clear to auscultation bilaterally with no wheeze, no rhonchi, no crackles  6. Cardiovascular : Heart rate is normal, rhythm is regular, no murmurs rubs or gallops, no peripheral edema  7. Gastrointestinal:  Abdomen is soft, nondistended, nontender to palpation  8. Skin:  Bruising on forearms, skin tear left wrist wrapped  9.Musculoskeletal:  No acute deformities, no calf tenderness    Data Review:    CBC Recent Labs  Lab 07/13/20 1642  WBC 8.9  HGB 12.0  HCT 40.6  PLT 214  MCV 90.4  MCH 26.7   MCHC 29.6*  RDW 14.9  LYMPHSABS 0.9  MONOABS 0.7  EOSABS 0.0  BASOSABS 0.1   ------------------------------------------------------------------------------------------------------------------  Results for orders placed or performed during the hospital encounter of 07/13/20 (from the past 48 hour(s))  CBC with Differential/Platelet     Status: Abnormal   Collection Time: 07/13/20  4:42 PM  Result Value Ref Range   WBC 8.9 4.0 - 10.5 K/uL   RBC 4.49 3.87 - 5.11 MIL/uL   Hemoglobin 12.0 12.0 - 15.0 g/dL   HCT 40.6 36 - 46 %   MCV 90.4 80.0 - 100.0 fL   MCH 26.7 26.0 - 34.0 pg   MCHC 29.6 (L) 30.0 - 36.0 g/dL   RDW 14.9 11.5 - 15.5 %   Platelets 214 150 - 400 K/uL   nRBC 0.0 0.0 - 0.2 %   Neutrophils Relative % 81 %   Neutro Abs 7.2 1.7 - 7.7 K/uL   Lymphocytes Relative 10 %   Lymphs Abs 0.9 0.7 - 4.0 K/uL   Monocytes Relative 8 %   Monocytes Absolute 0.7 0.1 - 1.0 K/uL   Eosinophils Relative 0 %   Eosinophils Absolute 0.0 0.0 - 0.5 K/uL   Basophils Relative 1 %   Basophils Absolute 0.1 0.0 - 0.1 K/uL   nRBC 0 0 /100 WBC   Abs Immature Granulocytes 0.00 0.00 - 0.07 K/uL    Comment: Performed at [facility], [address] [place], [place] [address]  Comprehensive metabolic panel     Status: Abnormal   Collection Time: 07/13/20  4:42 PM  Result Value Ref Range   Sodium 142 135 - 145 mmol/L   Potassium 3.9 3.5 - 5.1 mmol/L   Chloride 102 98 - 111 mmol/L   CO2 25 22 - 32 mmol/L   Glucose, Bld 112 (H) 70 - 99 mg/dL    Comment: Glucose reference range applies only to samples taken after fasting for at least 8 hours.   BUN 15 8 - 23 mg/dL   Creatinine, Ser 1.42 (H) 0.44 - 1.00 mg/dL   Calcium 9.0 8.9 - 10.3 mg/dL   Total Protein 6.6 6.5 - 8.1 g/dL   Albumin 2.8 (L) 3.5 - 5.0 g/dL   AST 25 15 - 41 U/L   ALT 12 0 - 44 U/L   Alkaline Phosphatase 42 38 - 126 U/L   Total Bilirubin 0.9 0.3 - 1.2 mg/dL   GFR, Estimated 38 (L) >60 mL/min    Comment: (NOTE) Calculated  using the CKD-EPI Creatinine Equation (2021)    Anion gap 15 5 - 15    Comment: Performed at [facility] [address] [place], [place] [address]  CK     Status: None   Collection Time: 07/13/20  6:47 PM  Result Value Ref Range   Total CK 221 38.0 - 234.0 U/L    Comment: Performed at [facility],  [address], [place], [place] [address]  Lactic acid, plasma     Status: Abnormal   Collection Time: 07/13/20  6:47 PM  Result Value Ref Range   Lactic Acid, Venous 2.1 (HH) 0.5 - 1.9 mmol/L    Comment: CRITICAL RESULT CALLED TO, READ BACK BY AND VERIFIED WITH: Elige Radon RN [phone] K FORSYTH Performed at [facility], [address], [place], [place] [address]     Chemistries  Recent Labs  Lab 07/13/20 1642  NA 142  K 3.9  CL 102  CO2 25  GLUCOSE 112*  BUN 15  CREATININE 1.42*  CALCIUM 9.0  AST 25  ALT 12  ALKPHOS 42  BILITOT 0.9   ------------------------------------------------------------------------------------------------------------------  ------------------------------------------------------------------------------------------------------------------ GFR: CrCl cannot be calculated (Unknown ideal weight.). Liver Function Tests: Recent Labs  Lab 07/13/20 1642  AST 25  ALT 12  ALKPHOS 42  BILITOT 0.9  PROT 6.6  ALBUMIN 2.8*   No results for input(s): LIPASE, AMYLASE in the last 168 hours. No results for input(s): AMMONIA in the last 168 hours. Coagulation Profile: No results for input(s): INR, PROTIME in the last 168 hours. Cardiac Enzymes: Recent Labs  Lab 07/13/20 1847  CKTOTAL 221   BNP (last 3 results) No results for input(s): PROBNP in the last 8760 hours. HbA1C: No results for input(s): HGBA1C in the last 72 hours. CBG: No results for input(s): GLUCAP in the last 168 hours. Lipid Profile: No results for input(s): CHOL, HDL, LDLCALC, TRIG, CHOLHDL, LDLDIRECT in the last 72 hours. Thyroid Function Tests: No results  for input(s): TSH, T4TOTAL, FREET4, T3FREE, THYROIDAB in the last 72 hours. Anemia Panel: No results for input(s): VITAMINB12, FOLATE, FERRITIN, TIBC, IRON, RETICCTPCT in the last 72 hours.  --------------------------------------------------------------------------------------------------------------- Urine analysis:    Component Value Date/Time   COLORURINE YELLOW 11/03/2016 2343   APPEARANCEUR CLEAR 11/03/2016 2343   LABSPEC 1.009 11/03/2016 2343   PHURINE 6.0 11/03/2016 2343   GLUCOSEU NEGATIVE 11/03/2016 2343   HGBUR SMALL (A) 11/03/2016 2343   BILIRUBINUR NEGATIVE 11/03/2016 2343   KETONESUR NEGATIVE 11/03/2016 2343   PROTEINUR NEGATIVE 11/03/2016 2343   UROBILINOGEN 0.2 03/30/2008 1638   NITRITE NEGATIVE 11/03/2016 2343   LEUKOCYTESUR NEGATIVE 11/03/2016 2343      Imaging Results:    DG Ribs Unilateral W/Chest Left  Result Date: 07/13/2020 CLINICAL DATA:  Status post fall with left-sided rib pain. EXAM: LEFT RIBS AND CHEST - 3+ VIEW COMPARISON:  July 23, 2018 FINDINGS: The lungs are hyperinflated. Moderate severity emphysematous lung disease is seen. Mild diffuse chronic appearing increased interstitial lung markings are noted. Mild atelectasis and/or infiltrate is also seen within the right upper lobe. There is no evidence of a pleural effusion or pneumothorax. A stable 6 mm calcified lung nodule is seen within the mid to upper left lung. Nondisplaced seventh and eighth left rib fractures are seen. There is no evidence of pneumothorax or pleural effusion. Heart size and mediastinal contours are within normal limits. Radiopaque surgical clips are seen within the right upper quadrant. IMPRESSION: 1. COPD and chronic interstitial lung disease with mild right upper lobe atelectasis and/or infiltrate. 2. Nondisplaced seventh and eighth left rib fractures. Electronically Signed   By: Virgina Norfolk M.D.   On: 07/13/2020 18:19       Assessment & Plan:    Active Problems:    AKI (acute kidney injury) ([place])   1. Fall 1. Mechanical fall 2. Multifactorial with likely polypharmacy, deconditioning contributing 3. Consult PT 4.  Reduce Xanax and trazodone dosing 5. Continue pain medications -as patient reports that her pain is uncontrolled without them 6. Continue to monitor 2. AKI 1. Creatinine at baseline is 1.06, today 1.42 2. Secondary to poor p.o. intake 3. Encourage p.o. intake, IV hydration overnight 4. Recheck in the a.m. 5. Hold nephrotoxic agents when possible 3. Acute hypoxic respiratory failure 1. Secondary to pain medication dose -patient dropped into the 80s after she was given her oxycodone 2. Continue oxycodone because of patient's recent fractures and increased pain, reducing Xanax dose 3. Continue 2 L nasal cannula, wean off as tolerated 4. Chest x-ray shows atelectasis with possible infiltrate, infiltrate is not suspected at this point as patient has had no fever no cough and only her baseline shortness of breath 5. Continue to monitor 4. Lactic acidosis 1. Secondary to poor p.o. intake 2. Lactic acid minimally elevated 2.1 3. Fluid bolus in the ER continue fluid hydration overnight 4. Recheck at midnight 5. Protein calorie malnutrition 1. Encourage nutrient dense p.o. intake 2. Protein shakes between meals 3. Continue to monitor 6. Hypotension 1. Blood pressure soft after oxycodone dosing 2. Also dehydration contributing 3. Continue IV fluids 4. Continue to monitor 7. Hypothyroidism 1. Check TSH 2. Continue Synthroid 8.    DVT Prophylaxis-   Heparin- SCDs   AM Labs Ordered, also please review Full Orders  Family Communication: No family at bedside  Code Status: DNR Admission status:Inpatient :The appropriate admission status for this patient is INPATIENT. Inpatient status is judged to be reasonable and necessary in order to provide the required intensity of service to ensure the patient's safety. The patient's presenting  symptoms, physical exam findings, and initial radiographic and laboratory data in the context of their chronic comorbidities is felt to place them at high risk for further clinical deterioration. Furthermore, it is not anticipated that the patient will be medically stable for discharge from the hospital within 2 midnights of admission. The following factors support the admission status of inpatient.     The patient's presenting symptoms include fall The worrisome physical exam findings include seventh and eighth rib fractures. The initial radiographic and laboratory data are worrisome because of AKI. The chronic co-morbidities include COPD, hypertension, hyperlipidemia, GERD, anxiety.       * I certify that at the point of admission it is my clinical judgment that the patient will require inpatient hospital care spanning beyond 2 midnights from the point of admission due to high intensity of service, high risk for further deterioration and high frequency of surveillance required.*  Time spent in minutes : [place]

## 2020-07-13 NOTE — ED Triage Notes (Signed)
Pt coming from home. Complaint of chest pain secondary to a fall last night. Pt complaining of right sided rib pain. Denies LOC. VSS. Hx. COPD, alzheimer's. NAD.

## 2020-07-14 DIAGNOSIS — N179 Acute kidney failure, unspecified: Secondary | ICD-10-CM | POA: Diagnosis not present

## 2020-07-14 DIAGNOSIS — S2239XA Fracture of one rib, unspecified side, initial encounter for closed fracture: Secondary | ICD-10-CM

## 2020-07-14 DIAGNOSIS — W19XXXA Unspecified fall, initial encounter: Secondary | ICD-10-CM | POA: Diagnosis not present

## 2020-07-14 DIAGNOSIS — S2242XA Multiple fractures of ribs, left side, initial encounter for closed fracture: Secondary | ICD-10-CM

## 2020-07-14 DIAGNOSIS — J9601 Acute respiratory failure with hypoxia: Secondary | ICD-10-CM | POA: Diagnosis not present

## 2020-07-14 LAB — COMPREHENSIVE METABOLIC PANEL
ALT: 11 U/L (ref 0–44)
AST: 17 U/L (ref 15–41)
Albumin: 2.3 g/dL — ABNORMAL LOW (ref 3.5–5.0)
Alkaline Phosphatase: 30 U/L — ABNORMAL LOW (ref 38–126)
Anion gap: 8 (ref 5–15)
BUN: 18 mg/dL (ref 8–23)
CO2: 28 mmol/L (ref 22–32)
Calcium: 8.4 mg/dL — ABNORMAL LOW (ref 8.9–10.3)
Chloride: 105 mmol/L (ref 98–111)
Creatinine, Ser: 1.26 mg/dL — ABNORMAL HIGH (ref 0.44–1.00)
GFR, Estimated: 44 mL/min — ABNORMAL LOW (ref >60)
Glucose, Bld: 89 mg/dL (ref 70–99)
Potassium: 4.6 mmol/L (ref 3.5–5.1)
Sodium: 141 mmol/L (ref 135–145)
Total Bilirubin: 0.7 mg/dL (ref 0.3–1.2)
Total Protein: 5.4 g/dL — ABNORMAL LOW (ref 6.5–8.1)

## 2020-07-14 LAB — CBC
HCT: 31.5 % — ABNORMAL LOW (ref 36.0–46.0)
Hemoglobin: 9.7 g/dL — ABNORMAL LOW (ref 12.0–15.0)
MCH: 27.3 pg (ref 26.0–34.0)
MCHC: 30.8 g/dL (ref 30.0–36.0)
MCV: 88.7 fL (ref 80.0–100.0)
Platelets: 178 10*3/uL (ref 150–400)
RBC: 3.55 MIL/uL — ABNORMAL LOW (ref 3.87–5.11)
RDW: 14.8 % (ref 11.5–15.5)
WBC: 8.3 10*3/uL (ref 4.0–10.5)
nRBC: 0 % (ref 0.0–0.2)

## 2020-07-14 LAB — URINALYSIS, ROUTINE W REFLEX MICROSCOPIC
Bilirubin Urine: NEGATIVE
Glucose, UA: NEGATIVE mg/dL
Ketones, ur: NEGATIVE mg/dL
Leukocytes,Ua: NEGATIVE
Nitrite: NEGATIVE
Protein, ur: NEGATIVE mg/dL
Specific Gravity, Urine: 1.008 (ref 1.005–1.030)
pH: 5 (ref 5.0–8.0)

## 2020-07-14 LAB — LACTIC ACID, PLASMA: Lactic Acid, Venous: 1.9 mmol/L (ref 0.5–1.9)

## 2020-07-14 LAB — RAPID URINE DRUG SCREEN, HOSP PERFORMED
Amphetamines: NOT DETECTED
Barbiturates: NOT DETECTED
Benzodiazepines: POSITIVE — AB
Cocaine: NOT DETECTED
Opiates: POSITIVE — AB
Tetrahydrocannabinol: NOT DETECTED

## 2020-07-14 LAB — TSH: TSH: 1.183 u[IU]/mL (ref 0.350–4.500)

## 2020-07-14 LAB — MAGNESIUM: Magnesium: 1.4 mg/dL — ABNORMAL LOW (ref 1.7–2.4)

## 2020-07-14 MED ORDER — LACTATED RINGERS IV SOLN: INTRAVENOUS | Status: AC

## 2020-07-14 MED ORDER — LACTATED RINGERS IV BOLUS
500.0000 mL | Freq: Once | INTRAVENOUS | Status: AC
  Administered 2020-07-14: 500 mL via INTRAVENOUS

## 2020-07-14 MED ORDER — OXYCODONE HCL 5 MG PO TABS
15.0000 mg | ORAL_TABLET | Freq: Four times a day (QID) | ORAL | Status: DC | PRN
  Administered 2020-07-14 (×2): 15 mg via ORAL
  Filled 2020-07-14 (×2): qty 3

## 2020-07-14 MED ORDER — MAGNESIUM OXIDE 400 (241.3 MG) MG PO TABS
800.0000 mg | ORAL_TABLET | Freq: Once | ORAL | Status: AC
  Administered 2020-07-14: 800 mg via ORAL
  Filled 2020-07-14: qty 2

## 2020-07-14 NOTE — Assessment & Plan Note (Addendum)
-  Considered in setting of pain medication and dehydration -Hold sedating medications as able

## 2020-07-14 NOTE — Hospital Course (Addendum)
Traci [name]  is a 74 y.o. female, with history of hypothyroidism, hypertension, hyperlipidemia, GERD, COPD, chronic lower back pain, anxiety who presented to the ER with a chief complaint of fall.   Patient reports that she fell during the night between 11/23 and 11/24 after attempting to transfer from her chair to her wheelchair.  She has been having pain in her arms and chest wall since. Her appetite and nutritional intake has been extremely poor; often times only eating 1 meal a day and minimal sips of water. Due to her worsening weakness and fall at home, she was brought to the ER for further evaluation and evaluation by PT. She was evaluated by physical therapy with recommendations for SNF at discharge.  She improved with fluid resuscitation and supportive care.

## 2020-07-14 NOTE — Evaluation (Signed)
Physical Therapy Evaluation Patient Details Name: Traci [name] MRN: [id number] DOB: 1946/07/15 Today's Date: 07/14/2020   History of Present Illness  Pt is a 74 year old woman admitted after falling while transferring from her w/c to her bed. Pt with poor PO intake and on 3L 02 at baseline. + AKI. PMH: COPD, HTN, chronic back pain, anxiety, depression, hypothyroidism.   Clinical Impression  Pt in bed upon arrival of PT, agreeable to evaluation at this time. Prior to admission the pt was transferring independently to [facility] which she uses for mobility, but receiving assist for ADLs. The pt reports she has been experiencing a slow decline in strength and mobility over last year with frequent falls. The pt now presents with limitations in functional mobility, strength, stability, safety awareness, activity tolerance, and power due to above dx, and will continue to benefit from skilled PT to address these deficits. The pt was able to complete bed mobility and multiple OOB transfers, but requires maxA through [facility] at this time to complete. The pt will continue to benefit from skilled PT to progress functional strength, stability, and power to allow for improved safety and independence with transfers prior to return home with family support.      Follow Up Recommendations SNF;Supervision/Assistance - 24 hour    Equipment Recommendations  None recommended by PT (defer to posta acute, pt is well equipped)    Recommendations for Other Services       Precautions / Restrictions Precautions Precautions: Fall Precaution Comments: pt reports multiple falls Restrictions Weight Bearing Restrictions: No      Mobility  Bed Mobility Overal bed mobility: Needs Assistance Bed Mobility: Supine to Sit     Supine to sit: Supervision     General bed mobility comments: increased time, HOB up, no physical assist    Transfers Overall transfer level: Needs assistance Equipment used: 1 person hand held  assist Transfers: Sit to/from Stand;Stand Pivot Transfers Sit to Stand: Max assist Stand pivot transfers: Max assist       General transfer comment: assist to rise and steady, flexed posture  Ambulation/Gait Ambulation/Gait assistance: Max assist Gait Distance (Feet): 5 Feet Assistive device: 1 person hand held assist Gait Pattern/deviations: Step-to pattern;Decreased stride length;Shuffle;Trunk flexed Gait velocity: decreased Gait velocity interpretation: <1.31 ft/sec, indicative of household ambulator General Gait Details: small lateral steps with BUE HHA. pt with trunk flexion and very small steps, benefits from cueing for hand positioning for safety      Balance Overall balance assessment: Needs assistance Sitting-balance support: Bilateral upper extremity supported Sitting balance-Leahy Scale: Fair Sitting balance - Comments: can tolerate without UE support, prefers BUE support Postural control: Posterior lean   Standing balance-Leahy Scale: Zero Standing balance comment: BUE support                             Pertinent Vitals/Pain Pain Assessment: Faces Faces Pain Scale: Hurts little more Pain Location: R shoulder and flank Pain Descriptors / Indicators: Sore;Discomfort Pain Intervention(s): Limited activity within patient's tolerance;Monitored during session;Repositioned    Home Living Family/patient expects to be discharged to:: Private residence Living Arrangements: Spouse/significant other;Children (spouse and son) Available Help at Discharge: Family;Available 24 hours/day Type of Home: Mobile home Home Access: Ramped entrance     Home Layout: One level Home Equipment: Wheelchair - manual;Bedside commode;Walker - 2 wheels;Other (comment);Hospital bed (3L O2)      Prior Function Level of Independence: Needs assistance   Gait /  Transfers Assistance Needed: propels her own manual w/c, transfers herself  ADL's / Homemaking Assistance Needed:  assisted for ADL and IADL by husband or daughter        Hand Dominance   Dominant Hand: Right    Extremity/Trunk Assessment   Upper Extremity Assessment Upper Extremity Assessment: Generalized weakness (tremors at baseline)    Lower Extremity Assessment Lower Extremity Assessment: Generalized weakness (poor ankle ROM)    Cervical / Trunk Assessment Cervical / Trunk Assessment: Kyphotic  Communication   Communication: No difficulties  Cognition Arousal/Alertness: Awake/alert Behavior During Therapy: WFL for tasks assessed/performed Overall Cognitive Status: No family/caregiver present to determine baseline cognitive functioning                                        General Comments General comments (skin integrity, edema, etc.): VSS on 2L O2    Exercises     Assessment/Plan    PT Assessment Patient needs continued PT services  PT Problem List Decreased strength;Decreased range of motion;Decreased activity tolerance;Decreased balance;Decreased mobility;Decreased cognition;Decreased safety awareness       PT Treatment Interventions DME instruction;Functional mobility training;Therapeutic activities;Therapeutic exercise;Balance training;Patient/family education;Wheelchair mobility training    PT Goals (Current goals can be found in the Care Plan section)  Acute Rehab PT Goals Patient Stated Goal: agreeable to rehab prior to returning home PT Goal Formulation: With patient Time For Goal Achievement: 07/28/20 Potential to Achieve Goals: Good    Frequency Min 3X/week   Barriers to discharge        Co-evaluation PT/OT/SLP Co-Evaluation/Treatment: Yes Reason for Co-Treatment: For patient/therapist safety;To address functional/ADL transfers;Necessary to address cognition/behavior during functional activity PT goals addressed during session: Mobility/safety with mobility;Balance         AM-PAC PT "6 Clicks" Mobility  Outcome Measure Help needed  turning from your back to your side while in a flat bed without using bedrails?: None Help needed moving from lying on your back to sitting on the side of a flat bed without using bedrails?: None Help needed moving to and from a bed to a chair (including a wheelchair)?: A Lot Help needed standing up from a chair using your arms (e.g., wheelchair or bedside chair)?: A Lot Help needed to walk in hospital room?: Total Help needed climbing 3-5 steps with a railing? : Total 6 Click Score: 14    End of Session Equipment Utilized During Treatment: Oxygen Activity Tolerance: Patient tolerated treatment well;Patient limited by fatigue Patient left: in chair;with call bell/phone within reach;with chair alarm set Nurse Communication: Mobility status PT Visit Diagnosis: Muscle weakness (generalized) (M62.81);History of falling (Z91.81);Other abnormalities of gait and mobility (R26.89)    Time: 1324-4010 PT Time Calculation (min) (ACUTE ONLY): 23 min   Charges:   PT Evaluation $PT Eval Moderate Complexity: 1 Mod          Karma Ganja, PT, DPT   Acute Rehabilitation Department Pager #: [phone]  Otho Bellows 07/14/2020, 10:49 AM

## 2020-07-14 NOTE — Evaluation (Signed)
Occupational Therapy Evaluation Patient Details Name: RIYANA BIEL MRN: [id number] DOB: [birth year] Today's Date: 07/14/2020    History of Present Illness Pt is a 74 year old woman admitted after falling while transferring from her w/c to her bed. Pt with poor PO intake and on 3L 02 at baseline. + AKI. PMH: COPD, HTN, chronic back pain, anxiety, depression, hypothyroidism.    Clinical Impression   Pt reports transferring herself to and from her w/c and propelling it independently. She is assisted for sponge bathing, dressing and all IADL. Pt presents with impaired cognition, unclear of baseline, generalized weakness and impaired standing balance. She reports R shoulder and flank pain. Pt requires set up to total assist for ADL and max assist for stand-pivot transfer with increased time. Recommending further rehab in SNF prior to return home as pt reports many falls. Will follow acutely.    Follow Up Recommendations  SNF;Supervision/Assistance - 24 hour    Equipment Recommendations  None recommended by OT    Recommendations for Other Services       Precautions / Restrictions Precautions Precautions: Fall Precaution Comments: pt reports multiple falls      Mobility Bed Mobility Overal bed mobility: Needs Assistance Bed Mobility: Supine to Sit     Supine to sit: Supervision     General bed mobility comments: increased time, HOB up, no physical assist    Transfers Overall transfer level: Needs assistance Equipment used: 1 person hand held assist Transfers: Sit to/from Stand;Stand Pivot Transfers Sit to Stand: Max assist Stand pivot transfers: Max assist       General transfer comment: assist to rise and steady, flexed posture    Balance Overall balance assessment: Needs assistance   Sitting balance-Leahy Scale: Fair       Standing balance-Leahy Scale: Zero                             ADL either performed or assessed with clinical judgement    ADL Overall ADL's : Needs assistance/impaired Eating/Feeding: Set up;Sitting   Grooming: Brushing hair;Sitting;Maximal assistance   Upper Body Bathing: Moderate assistance;Sitting   Lower Body Bathing: Total assistance;Sit to/from stand   Upper Body Dressing : Minimal assistance;Sitting   Lower Body Dressing: Total assistance;Bed level   Toilet Transfer: Maximal assistance;Stand-pivot;BSC   Toileting- Clothing Manipulation and Hygiene: Total assistance;Sit to/from stand               Vision Patient Visual Report: No change from baseline       Perception     Praxis      Pertinent Vitals/Pain Pain Assessment: Faces Faces Pain Scale: Hurts little more Pain Location: R shoulder and flank Pain Descriptors / Indicators: Sore;Discomfort Pain Intervention(s): Monitored during session;Repositioned     Hand Dominance Right   Extremity/Trunk Assessment Upper Extremity Assessment Upper Extremity Assessment: Generalized weakness (tremor, arthritic change)   Lower Extremity Assessment Lower Extremity Assessment: Defer to PT evaluation   Cervical / Trunk Assessment Cervical / Trunk Assessment: Kyphotic   Communication Communication Communication: No difficulties   Cognition Arousal/Alertness: Awake/alert Behavior During Therapy: WFL for tasks assessed/performed Overall Cognitive Status: No family/caregiver present to determine baseline cognitive functioning                                     General Comments       Exercises  Shoulder Instructions      Home Living Family/patient expects to be discharged to:: Private residence Living Arrangements: Spouse/significant other;Children (spouse and son) Available Help at Discharge: Family;Available 24 hours/day Type of Home: Mobile home Home Access: Ramped entrance     Home Layout: One level     Bathroom Shower/Tub: Occupational psychologist: Standard     Home Equipment:  Wheelchair - manual;Bedside commode;Walker - 2 wheels;Other (comment);Hospital bed (02)          Prior Functioning/Environment Level of Independence: Needs assistance  Gait / Transfers Assistance Needed: propels her own manual w/c, transfers herself ADL's / Homemaking Assistance Needed: assisted for ADL and IADL by husband or daughter            OT Problem List: Decreased strength;Decreased activity tolerance;Impaired balance (sitting and/or standing);Decreased cognition;Decreased safety awareness;Decreased knowledge of use of DME or AE;Pain      OT Treatment/Interventions: Self-care/ADL training;Patient/family education;Balance training;Therapeutic activities    OT Goals(Current goals can be found in the care plan section) Acute Rehab OT Goals Patient Stated Goal: agreeable to rehab prior to returning home OT Goal Formulation: With patient Time For Goal Achievement: 07/28/20 Potential to Achieve Goals: Good ADL Goals Pt Will Perform Grooming: with set-up;sitting Pt Will Perform Upper Body Bathing: with min assist;sitting Pt Will Perform Upper Body Dressing: with set-up;with supervision;sitting Pt Will Transfer to Toilet: with min assist;stand pivot transfer;bedside commode Pt Will Perform Toileting - Clothing Manipulation and hygiene: with mod assist;sitting/lateral leans  OT Frequency: Min 2X/week   Barriers to D/C:            Co-evaluation              AM-PAC OT "6 Clicks" Daily Activity     Outcome Measure Help from another person eating meals?: A Little Help from another person taking care of personal grooming?: A Lot Help from another person toileting, which includes using toliet, bedpan, or urinal?: Total Help from another person bathing (including washing, rinsing, drying)?: A Lot Help from another person to put on and taking off regular upper body clothing?: A Little Help from another person to put on and taking off regular lower body clothing?: Total 6  Click Score: 12   End of Session Equipment Utilized During Treatment: Oxygen (2L) Nurse Communication: Mobility status  Activity Tolerance: Patient tolerated treatment well Patient left: in chair;with call bell/phone within reach;with chair alarm set  OT Visit Diagnosis: Unsteadiness on feet (R26.81);Muscle weakness (generalized) (M62.81);Pain;Other symptoms and signs involving cognitive function                Time: 7322-0254 OT Time Calculation (min): 43 min Charges:  OT General Charges $OT Visit: 1 Visit OT Evaluation $OT Eval Moderate Complexity: 1 Mod OT Treatments $Self Care/Home Management : 8-22 mins  Nestor Lewandowsky, OTR/L Acute Rehabilitation Services Pager: [phone] Office: [phone]  Malka So 07/14/2020, 10:03 AM

## 2020-07-14 NOTE — Assessment & Plan Note (Signed)
-   due to fall; CXR shows Left 7th and 8th rib fx - continue supportive care - already on oxy at home; d/c morphine as patient too frail and does not appear to be in severe pain

## 2020-07-14 NOTE — Progress Notes (Signed)
PROGRESS NOTE    Traci [name]   KXF:[id number]  DOB: 27-Apr-1946  DOA: 07/13/2020     1  PCP: Bonnita Nasuti, MD  CC: Fall at home  [facility] Course: Traci [name]  is a 74 y.o. female, with history of hypothyroidism, hypertension, hyperlipidemia, GERD, COPD, chronic lower back pain, anxiety who presented to the ER with a chief complaint of fall.   Patient reports that she fell during the night between 11/23 and 11/24 after attempting to transfer from her chair to her wheelchair.  She has been having pain in her arms and chest wall since. Her appetite and nutritional intake has been extremely poor; often times only eating 1 meal a day and minimal sips of water. Due to her worsening weakness and fall at home, she was brought to the ER for further evaluation and evaluation by PT.   Interval History:  Seen in her room this morning sitting in recliner bedside.  She appeared significantly frail and weak.  She had no acute concerns but did endorse some pain around her rib fracture site.  She confirmed her poor appetite at home.  Reviewed with her that we would continue hydration and having her work with physical therapy.  Old records reviewed in assessment of this patient  ROS: Constitutional: positive for fatigue and malaise, Cardiovascular: negative for chest pain, Gastrointestinal: negative for abdominal pain and Genitourinary:negative for dysuria  Assessment & Plan: Fall -Mechanical fall at home.  Etiology considered due to worsening frailty/deconditioning as well as opioid use.  Database reviewed, has not filled alprazolam since 03/31/2020.  Check UDS.  Discontinued benzo for now especially with her age and concomitant use of opioids (if benzos in UDS may need to re-order and taper off) - PT/OT eval  AKI (acute kidney injury) ([place]) -Considered prerenal due to poor oral intake -Continue fluids -Daily BMP  Rib fracture - due to fall; CXR shows Left 7th and 8th rib fx - continue  supportive care - already on oxy at home; d/c morphine as patient too frail and does not appear to be in severe pain   Hypothyroidism -TSH normal -Continue Synthroid  Acute respiratory failure with hypoxia ([place]) -Wean as able -CXR shows atelectasis. -Incentive spirometer ordered  Hypotension-resolved as of 07/14/2020 -Considered in setting of pain medication and dehydration -Continue fluids -Hold sedating medications as able    Antimicrobials: None  DVT prophylaxis: HSQ Code Status: DNR Family Communication: none present Disposition Plan: Status is: Inpatient  Remains inpatient appropriate because:Unsafe d/c plan, IV treatments appropriate due to intensity of illness or inability to take PO and Inpatient level of care appropriate due to severity of illness   Dispo: The patient is from: Home              Anticipated d/c is to: pending PT eval, likely SNF              Anticipated d/c date is: > 3 days              Patient currently is not medically stable to d/c.       Objective: Blood pressure (!) 125/45, pulse 92, temperature 98.6 F (37 C), temperature source Oral, resp. rate 18, height 5\' 9"  (1.753 m), weight 60.7 kg, SpO2 99 %.  Examination: General appearance: Frail and chronically ill-appearing elderly woman laying in recliner in no distress Head: Normocephalic, without obvious abnormality, atraumatic Eyes: EOMI Lungs: clear to auscultation bilaterally Heart: regular rate and rhythm and S1, S2 normal  Abdomen: normal findings: bowel sounds normal and soft, non-tender Extremities: Lower extremity edema noted Skin: Scattered bruising and senile purpura Neurologic: Diffuse weakness but no obvious focal deficits  Consultants:   None  Procedures:   None  Data Reviewed: I have personally reviewed following labs and imaging studies Results for orders placed or performed during the hospital encounter of 07/13/20 (from the past 24 hour(s))  CBC with  Differential/Platelet     Status: Abnormal   Collection Time: 07/13/20  4:42 PM  Result Value Ref Range   WBC 8.9 4.0 - 10.5 K/uL   RBC 4.49 3.87 - 5.11 MIL/uL   Hemoglobin 12.0 12.0 - 15.0 g/dL   HCT 40.6 36 - 46 %   MCV 90.4 80.0 - 100.0 fL   MCH 26.7 26.0 - 34.0 pg   MCHC 29.6 (L) 30.0 - 36.0 g/dL   RDW 14.9 11.5 - 15.5 %   Platelets 214 150 - 400 K/uL   nRBC 0.0 0.0 - 0.2 %   Neutrophils Relative % 81 %   Neutro Abs 7.2 1.7 - 7.7 K/uL   Lymphocytes Relative 10 %   Lymphs Abs 0.9 0.7 - 4.0 K/uL   Monocytes Relative 8 %   Monocytes Absolute 0.7 0.1 - 1.0 K/uL   Eosinophils Relative 0 %   Eosinophils Absolute 0.0 0.0 - 0.5 K/uL   Basophils Relative 1 %   Basophils Absolute 0.1 0.0 - 0.1 K/uL   nRBC 0 0 /100 WBC   Abs Immature Granulocytes 0.00 0.00 - 0.07 K/uL  Comprehensive metabolic panel     Status: Abnormal   Collection Time: 07/13/20  4:42 PM  Result Value Ref Range   Sodium 142 135 - 145 mmol/L   Potassium 3.9 3.5 - 5.1 mmol/L   Chloride 102 98 - 111 mmol/L   CO2 25 22 - 32 mmol/L   Glucose, Bld 112 (H) 70 - 99 mg/dL   BUN 15 8 - 23 mg/dL   Creatinine, Ser 1.42 (H) 0.44 - 1.00 mg/dL   Calcium 9.0 8.9 - 10.3 mg/dL   Total Protein 6.6 6.5 - 8.1 g/dL   Albumin 2.8 (L) 3.5 - 5.0 g/dL   AST 25 15 - 41 U/L   ALT 12 0 - 44 U/L   Alkaline Phosphatase 42 38 - 126 U/L   Total Bilirubin 0.9 0.3 - 1.2 mg/dL   GFR, Estimated 38 (L) >60 mL/min   Anion gap 15 5 - 15  CK     Status: None   Collection Time: 07/13/20  6:47 PM  Result Value Ref Range   Total CK 221 38.0 - 234.0 U/L  Lactic acid, plasma     Status: Abnormal   Collection Time: 07/13/20  6:47 PM  Result Value Ref Range   Lactic Acid, Venous 2.1 (HH) 0.5 - 1.9 mmol/L  Resp Panel by RT-PCR (Flu A&B, Covid) Nasopharyngeal Swab     Status: None   Collection Time: 07/13/20  8:17 PM   Specimen: Nasopharyngeal Swab; Nasopharyngeal(NP) swabs in vial transport medium  Result Value Ref Range   SARS Coronavirus 2 by RT  PCR NEGATIVE NEGATIVE   Influenza A by PCR NEGATIVE NEGATIVE   Influenza B by PCR NEGATIVE NEGATIVE  Magnesium     Status: Abnormal   Collection Time: 07/14/20 12:25 AM  Result Value Ref Range   Magnesium 1.4 (L) 1.7 - 2.4 mg/dL  TSH     Status: None   Collection Time: 07/14/20 12:25 AM  Result Value Ref Range   TSH 1.183 0.350 - 4.500 uIU/mL  Comprehensive metabolic panel     Status: Abnormal   Collection Time: 07/14/20 12:25 AM  Result Value Ref Range   Sodium 141 135 - 145 mmol/L   Potassium 4.6 3.5 - 5.1 mmol/L   Chloride 105 98 - 111 mmol/L   CO2 28 22 - 32 mmol/L   Glucose, Bld 89 70 - 99 mg/dL   BUN 18 8 - 23 mg/dL   Creatinine, Ser 1.26 (H) 0.44 - 1.00 mg/dL   Calcium 8.4 (L) 8.9 - 10.3 mg/dL   Total Protein 5.4 (L) 6.5 - 8.1 g/dL   Albumin 2.3 (L) 3.5 - 5.0 g/dL   AST 17 15 - 41 U/L   ALT 11 0 - 44 U/L   Alkaline Phosphatase 30 (L) 38 - 126 U/L   Total Bilirubin 0.7 0.3 - 1.2 mg/dL   GFR, Estimated 44 (L) >60 mL/min   Anion gap 8 5 - 15  CBC     Status: Abnormal   Collection Time: 07/14/20 12:25 AM  Result Value Ref Range   WBC 8.3 4.0 - 10.5 K/uL   RBC 3.55 (L) 3.87 - 5.11 MIL/uL   Hemoglobin 9.7 (L) 12.0 - 15.0 g/dL   HCT 31.5 (L) 36 - 46 %   MCV 88.7 80.0 - 100.0 fL   MCH 27.3 26.0 - 34.0 pg   MCHC 30.8 30.0 - 36.0 g/dL   RDW 14.8 11.5 - 15.5 %   Platelets 178 150 - 400 K/uL   nRBC 0.0 0.0 - 0.2 %  Lactic acid, plasma     Status: None   Collection Time: 07/14/20 12:25 AM  Result Value Ref Range   Lactic Acid, Venous 1.9 0.5 - 1.9 mmol/L  Urine rapid drug screen (hosp performed)     Status: Abnormal   Collection Time: 07/14/20  8:31 AM  Result Value Ref Range   Opiates POSITIVE (A) NONE DETECTED   Cocaine NONE DETECTED NONE DETECTED   Benzodiazepines POSITIVE (A) NONE DETECTED   Amphetamines NONE DETECTED NONE DETECTED   Tetrahydrocannabinol NONE DETECTED NONE DETECTED   Barbiturates NONE DETECTED NONE DETECTED  Urinalysis, Routine w reflex  microscopic Urine, Clean Catch     Status: Abnormal   Collection Time: 07/14/20 12:36 PM  Result Value Ref Range   Color, Urine YELLOW YELLOW   APPearance CLEAR CLEAR   Specific Gravity, Urine 1.008 1.005 - 1.030   pH 5.0 5.0 - 8.0   Glucose, UA NEGATIVE NEGATIVE mg/dL   Hgb urine dipstick SMALL (A) NEGATIVE   Bilirubin Urine NEGATIVE NEGATIVE   Ketones, ur NEGATIVE NEGATIVE mg/dL   Protein, ur NEGATIVE NEGATIVE mg/dL   Nitrite NEGATIVE NEGATIVE   Leukocytes,Ua NEGATIVE NEGATIVE   RBC / HPF 0-5 0 - 5 RBC/hpf   WBC, UA 0-5 0 - 5 WBC/hpf   Bacteria, UA RARE (A) NONE SEEN   Squamous Epithelial / LPF 0-5 0 - 5    Recent Results (from the past 240 hour(s))  Resp Panel by RT-PCR (Flu A&B, Covid) Nasopharyngeal Swab     Status: None   Collection Time: 07/13/20  8:17 PM   Specimen: Nasopharyngeal Swab; Nasopharyngeal(NP) swabs in vial transport medium  Result Value Ref Range Status   SARS Coronavirus 2 by RT PCR NEGATIVE NEGATIVE Final    Comment: (NOTE) SARS-CoV-2 target nucleic acids are NOT DETECTED.  The SARS-CoV-2 RNA is generally detectable in upper respiratory specimens during the acute  phase of infection. The lowest concentration of SARS-CoV-2 viral copies this assay can detect is 138 copies/mL. A negative result does not preclude SARS-Cov-2 infection and should not be used as the sole basis for treatment or other patient management decisions. A negative result may occur with  improper specimen collection/handling, submission of specimen other than nasopharyngeal swab, presence of viral mutation(s) within the areas targeted by this assay, and inadequate number of viral copies(<138 copies/mL). A negative result must be combined with clinical observations, patient history, and epidemiological information. The expected result is Negative.  Fact Sheet for Patients:  EntrepreneurPulse.com.au  Fact Sheet for Healthcare Providers:    IncredibleEmployment.be  This test is no t yet approved or cleared by the [place] FDA and  has been authorized for detection and/or diagnosis of SARS-CoV-2 by FDA under an Emergency Use Authorization (EUA). This EUA will remain  in effect (meaning this test can be used) for the duration of the COVID-19 declaration under Section 564(b)(1) of the Act, 21 U.S.C.section 360bbb-3(b)(1), unless the authorization is terminated  or revoked sooner.       Influenza A by PCR NEGATIVE NEGATIVE Final   Influenza B by PCR NEGATIVE NEGATIVE Final    Comment: (NOTE) The Xpert Xpress SARS-CoV-2/FLU/RSV plus assay is intended as an aid in the diagnosis of influenza from Nasopharyngeal swab specimens and should not be used as a sole basis for treatment. Nasal washings and aspirates are unacceptable for Xpert Xpress SARS-CoV-2/FLU/RSV testing.  Fact Sheet for Patients: EntrepreneurPulse.com.au  Fact Sheet for Healthcare Providers: IncredibleEmployment.be  This test is not yet approved or cleared by the [place] FDA and has been authorized for detection and/or diagnosis of SARS-CoV-2 by FDA under an Emergency Use Authorization (EUA). This EUA will remain in effect (meaning this test can be used) for the duration of the COVID-19 declaration under Section 564(b)(1) of the Act, 21 U.S.C. section 360bbb-3(b)(1), unless the authorization is terminated or revoked.  Performed at [facility], [place] [address] [place], [place] [address]      Radiology Studies: DG Ribs Unilateral W/Chest Left  Result Date: 07/13/2020 CLINICAL DATA:  Status post fall with left-sided rib pain. EXAM: LEFT RIBS AND CHEST - 3+ VIEW COMPARISON:  July 23, 2018 FINDINGS: The lungs are hyperinflated. Moderate severity emphysematous lung disease is seen. Mild diffuse chronic appearing increased interstitial lung markings are noted. Mild atelectasis and/or  infiltrate is also seen within the right upper lobe. There is no evidence of a pleural effusion or pneumothorax. A stable 6 mm calcified lung nodule is seen within the mid to upper left lung. Nondisplaced seventh and eighth left rib fractures are seen. There is no evidence of pneumothorax or pleural effusion. Heart size and mediastinal contours are within normal limits. Radiopaque surgical clips are seen within the right upper quadrant. IMPRESSION: 1. COPD and chronic interstitial lung disease with mild right upper lobe atelectasis and/or infiltrate. 2. Nondisplaced seventh and eighth left rib fractures. Electronically Signed   By: Virgina Norfolk M.D.   On: 07/13/2020 18:19   DG Ribs Unilateral W/Chest Left  Final Result      Scheduled Meds: . ARIPiprazole  2 mg Oral Daily  . buPROPion  450 mg Oral Daily  . busPIRone  30 mg Oral TID  . donepezil  5 mg Oral QHS  . feeding supplement  237 mL Oral BID BM  . folic acid  1 mg Oral QHS  . heparin  5,000 Units Subcutaneous Q8H  .  levothyroxine  25 mcg Oral QAC breakfast  . lidocaine  1 patch Transdermal Q24H  . multivitamin with minerals  1 tablet Oral Daily  . pantoprazole  40 mg Oral Daily  . tamsulosin  0.4 mg Oral Daily   PRN Meds: acetaminophen **OR** acetaminophen, albuterol, ondansetron **OR** ondansetron (ZOFRAN) IV, oxyCODONE, polyethylene glycol, traZODone Continuous Infusions:   LOS: 1 day  Time spent: Greater than 50% of the 35 minute visit was spent in counseling/coordination of care for the patient as laid out in the A&P.   Dwyane Dee, MD [facility] 07/14/2020, 1:36 PM

## 2020-07-14 NOTE — Assessment & Plan Note (Addendum)
-  Weaned to RA -CXR shows atelectasis. -Incentive spirometer ordered

## 2020-07-14 NOTE — Progress Notes (Signed)
   07/14/20 0000  Assess: MEWS Score  Temp 97.8 F (36.6 C)  BP (!) 98/38  Pulse Rate 90  ECG Heart Rate 90  Resp (!) 21  SpO2 97 %  Assess: MEWS Score  MEWS Temp 0  MEWS Systolic 1  MEWS Pulse 0  MEWS RR 1  MEWS LOC 0  MEWS Score 2  MEWS Score Color Yellow  Assess: if the MEWS score is Yellow or Red  Were vital signs taken at a resting state? Yes  Focused Assessment No change from prior assessment  Early Detection of Sepsis Score *See Row Information* Low  MEWS guidelines implemented *See Row Information* Yes  Treat  MEWS Interventions Escalated (See documentation below);Administered scheduled meds/treatments  Pain Scale 0-10  Pain Score 8  Pain Intervention(s) Medication (See eMAR);MD notified (Comment)  Take Vital Signs  Increase Vital Sign Frequency  Yellow: Q 2hr X 2 then Q 4hr X 2, if remains yellow, continue Q 4hrs  Escalate  MEWS: Escalate Yellow: discuss with charge nurse/RN and consider discussing with provider and RRT  Notify: Charge Nurse/RN  Name of Charge Nurse/RN Notified [place] RN  Date Charge Nurse/RN Notified 07/14/20  Time Charge Nurse/RN Notified 0005  Notify: Provider  Provider Name/Title Chotiner MD  Date Provider Notified 07/14/20  Time Provider Notified 0005  Notification Type Page  Notification Reason Other (Comment)  Response See new orders  Date of Provider Response 07/14/20  Time of Provider Response 0010  Document  Patient Outcome Other (Comment)  Progress note created (see row info) Yes  MD paged for yellow MEWS due to RR and SBP. Scheduled oxycodone changed to PRN. NS infusion changed to LR. Yellow MEWS protocol activated.

## 2020-07-14 NOTE — Assessment & Plan Note (Signed)
-  TSH normal - Continue Synthroid 

## 2020-07-14 NOTE — Assessment & Plan Note (Addendum)
-  Considered prerenal due to poor oral intake - improved with IVF, continue encouraging good oral intake -Daily BMP

## 2020-07-14 NOTE — Assessment & Plan Note (Addendum)
-  Mechanical fall at home.  Etiology considered due to worsening frailty/deconditioning as well as opioid use.  Database reviewed, has not filled alprazolam since 03/31/2020.  Check UDS (positive for benzos and opiates). Holding off on benzo still for now with close monitoring for any w/d signs  - PT/OT eval: rec's for SNF. Awaiting placement

## 2020-07-14 NOTE — Progress Notes (Signed)
Zero output for shift. Bladder scan performed, 278mls in bladder currently. Patient has attempted to void twice in purewick and bedpan with no success.

## 2020-07-15 DIAGNOSIS — W19XXXA Unspecified fall, initial encounter: Secondary | ICD-10-CM | POA: Diagnosis not present

## 2020-07-15 DIAGNOSIS — F1120 Opioid dependence, uncomplicated: Secondary | ICD-10-CM

## 2020-07-15 DIAGNOSIS — J9601 Acute respiratory failure with hypoxia: Secondary | ICD-10-CM | POA: Diagnosis not present

## 2020-07-15 DIAGNOSIS — N179 Acute kidney failure, unspecified: Secondary | ICD-10-CM | POA: Diagnosis not present

## 2020-07-15 LAB — CBC WITH DIFFERENTIAL/PLATELET
Abs Immature Granulocytes: 0.07 K/uL (ref 0.00–0.07)
Basophils Absolute: 0 K/uL (ref 0.0–0.1)
Basophils Relative: 0 %
Eosinophils Absolute: 0.1 K/uL (ref 0.0–0.5)
Eosinophils Relative: 1 %
HCT: 33.3 % — ABNORMAL LOW (ref 36.0–46.0)
Hemoglobin: 10 g/dL — ABNORMAL LOW (ref 12.0–15.0)
Immature Granulocytes: 1 %
Lymphocytes Relative: 12 %
Lymphs Abs: 0.8 K/uL (ref 0.7–4.0)
MCH: 26.5 pg (ref 26.0–34.0)
MCHC: 30 g/dL (ref 30.0–36.0)
MCV: 88.3 fL (ref 80.0–100.0)
Monocytes Absolute: 0.3 K/uL (ref 0.1–1.0)
Monocytes Relative: 5 %
Neutro Abs: 5.2 K/uL (ref 1.7–7.7)
Neutrophils Relative %: 81 %
Platelets: 152 K/uL (ref 150–400)
RBC: 3.77 MIL/uL — ABNORMAL LOW (ref 3.87–5.11)
RDW: 14.9 % (ref 11.5–15.5)
WBC: 6.5 K/uL (ref 4.0–10.5)
nRBC: 0 % (ref 0.0–0.2)

## 2020-07-15 LAB — BASIC METABOLIC PANEL
Anion gap: 9 (ref 5–15)
BUN: 16 mg/dL (ref 8–23)
CO2: 26 mmol/L (ref 22–32)
Calcium: 8.5 mg/dL — ABNORMAL LOW (ref 8.9–10.3)
Chloride: 102 mmol/L (ref 98–111)
Creatinine, Ser: 1.07 mg/dL — ABNORMAL HIGH (ref 0.44–1.00)
GFR, Estimated: 54 mL/min — ABNORMAL LOW (ref >60)
Glucose, Bld: 71 mg/dL (ref 70–99)
Potassium: 3.4 mmol/L — ABNORMAL LOW (ref 3.5–5.1)
Sodium: 137 mmol/L (ref 135–145)

## 2020-07-15 LAB — MAGNESIUM: Magnesium: 1.7 mg/dL (ref 1.7–2.4)

## 2020-07-15 MED ORDER — OXYCODONE HCL 5 MG PO TABS
5.0000 mg | ORAL_TABLET | Freq: Four times a day (QID) | ORAL | Status: DC | PRN
  Administered 2020-07-15 – 2020-07-22 (×17): 5 mg via ORAL
  Filled 2020-07-15 (×17): qty 1

## 2020-07-15 MED ORDER — POTASSIUM CHLORIDE CRYS ER 20 MEQ PO TBCR
40.0000 meq | EXTENDED_RELEASE_TABLET | Freq: Once | ORAL | Status: AC
  Administered 2020-07-15: 40 meq via ORAL
  Filled 2020-07-15: qty 2

## 2020-07-15 NOTE — Assessment & Plan Note (Signed)
-  Last filled oxycodone on 06/18/2020.  Tablets are 15 mg and she receives 120 for quantity.  Discussed bedside on 07/15/2020, she is actually cutting her tablet in approximately 3 or 4 pieces and takes them about 4 times a day -Decrease oxycodone to 5 mg q6h PRN; we also discussed further decreasing of her dosage and frequency while in the hospital, she seems amenable to this for now; pain is chronic back pain mostly

## 2020-07-15 NOTE — Progress Notes (Signed)
Pt sat up for breakfast assisted to chair, needs frequent cues for safety. Sat up in chair for 10-15 and assisted back to bed.

## 2020-07-15 NOTE — Plan of Care (Signed)
  Problem: Clinical Measurements: Goal: Diagnostic test results will improve Outcome: Progressing   Problem: Clinical Measurements: Goal: Cardiovascular complication will be avoided Outcome: Progressing   Problem: Education: Goal: Knowledge of General Education information will improve Description: Including pain rating scale, medication(s)/side effects and non-pharmacologic comfort measures Outcome: Not Progressing   Problem: Health Behavior/Discharge Planning: Goal: Ability to manage health-related needs will improve Outcome: Not Progressing   Problem: Activity: Goal: Risk for activity intolerance will decrease Outcome: Not Progressing   Problem: Nutrition: Goal: Adequate nutrition will be maintained Outcome: Not Progressing   Problem: Coping: Goal: Level of anxiety will decrease Outcome: Not Progressing

## 2020-07-15 NOTE — Progress Notes (Signed)
PROGRESS NOTE    THARA SEARING   JJH:[id number]  DOB: 1945/11/27  DOA: 07/13/2020     2  PCP: Bonnita Nasuti, MD  CC: Fall at home  [facility] Course: Luanne Krzyzanowski  is a 74 y.o. female, with history of hypothyroidism, hypertension, hyperlipidemia, GERD, COPD, chronic lower back pain, anxiety who presented to the ER with a chief complaint of fall.   Patient reports that she fell during the night between 11/23 and 11/24 after attempting to transfer from her chair to her wheelchair.  She has been having pain in her arms and chest wall since. Her appetite and nutritional intake has been extremely poor; often times only eating 1 meal a day and minimal sips of water. Due to her worsening weakness and fall at home, she was brought to the ER for further evaluation and evaluation by PT.   Interval History:  Resting in bed this morning in no distress.  We discussed her home oxycodone use; she actually states that she cuts them into approximately 3 to 4 pieces and takes them 4 times a day.  When I discussed that this was still too much opioids at her age, she was amenable for further decreasing dosage and frequency while here. She understands she is weak and deconditioned and likely in need of at least rehab.   Old records reviewed in assessment of this patient  ROS: Constitutional: positive for fatigue and malaise, Cardiovascular: negative for chest pain, Gastrointestinal: negative for abdominal pain and Genitourinary:negative for dysuria  Assessment & Plan: Fall -Mechanical fall at home.  Etiology considered due to worsening frailty/deconditioning as well as opioid use.  Database reviewed, has not filled alprazolam since 03/31/2020.  Check UDS (positive for benzos and opiates). Holding off on benzo still for now with close monitoring for any w/d signs  - PT/OT eval  AKI (acute kidney injury) ([place]) -Considered prerenal due to poor oral intake -Continue fluids; improving with IVF -Daily  BMP  Opioid dependence ([place]) -Last filled oxycodone on 06/18/2020.  Tablets are 15 mg and she receives 120 for quantity.  Discussed bedside on 07/15/2020, she is actually cutting her tablet in approximately 3 or 4 pieces and takes them about 4 times a day -Decrease oxycodone to 5 mg q6h PRN; we also discussed further decreasing of her dosage and frequency while in the hospital, she seems amenable to this for now; pain is chronic back pain mostly   Rib fracture - due to fall; CXR shows Left 7th and 8th rib fx - continue supportive care - already on oxy at home; d/c morphine as patient too frail and does not appear to be in severe pain   Hypothyroidism -TSH normal -Continue Synthroid  Hypotension-resolved as of 07/14/2020 -Considered in setting of pain medication and dehydration -Continue fluids -Hold sedating medications as able  Acute respiratory failure with hypoxia (HCC)-resolved as of 07/15/2020 -Weaned to RA -CXR shows atelectasis. -Incentive spirometer ordered   Antimicrobials: None  DVT prophylaxis: HSQ Code Status: DNR Family Communication: none present Disposition Plan: Status is: Inpatient  Remains inpatient appropriate because:Unsafe d/c plan, IV treatments appropriate due to intensity of illness or inability to take PO and Inpatient level of care appropriate due to severity of illness   Dispo: The patient is from: Home              Anticipated d/c is to: pending PT eval, likely SNF              Anticipated d/c  date is: > 3 days              Patient currently is not medically stable to d/c.  Objective: Blood pressure (!) 119/58, pulse 86, temperature 98.3 F (36.8 C), temperature source Oral, resp. rate 20, height 5\' 9"  (1.753 m), weight 62.5 kg, SpO2 98 %.  Examination: General appearance: Frail and chronically ill-appearing elderly woman laying in bed in no distress Head: Normocephalic, without obvious abnormality, atraumatic Eyes: EOMI Lungs: clear to  auscultation bilaterally Heart: regular rate and rhythm and S1, S2 normal Abdomen: normal findings: bowel sounds normal and soft, non-tender Extremities: Lower extremity edema noted Skin: Scattered bruising and senile purpura Neurologic: Diffuse weakness but no obvious focal deficits  Consultants:   None  Procedures:   None  Data Reviewed: I have personally reviewed following labs and imaging studies Results for orders placed or performed during the hospital encounter of 07/13/20 (from the past 24 hour(s))  Basic metabolic panel     Status: Abnormal   Collection Time: 07/15/20  3:21 AM  Result Value Ref Range   Sodium 137 135 - 145 mmol/L   Potassium 3.4 (L) 3.5 - 5.1 mmol/L   Chloride 102 98 - 111 mmol/L   CO2 26 22 - 32 mmol/L   Glucose, Bld 71 70 - 99 mg/dL   BUN 16 8 - 23 mg/dL   Creatinine, Ser 1.07 (H) 0.44 - 1.00 mg/dL   Calcium 8.5 (L) 8.9 - 10.3 mg/dL   GFR, Estimated 54 (L) >60 mL/min   Anion gap 9 5 - 15  CBC with Differential/Platelet     Status: Abnormal   Collection Time: 07/15/20  3:21 AM  Result Value Ref Range   WBC 6.5 4.0 - 10.5 K/uL   RBC 3.77 (L) 3.87 - 5.11 MIL/uL   Hemoglobin 10.0 (L) 12.0 - 15.0 g/dL   HCT 33.3 (L) 36 - 46 %   MCV 88.3 80.0 - 100.0 fL   MCH 26.5 26.0 - 34.0 pg   MCHC 30.0 30.0 - 36.0 g/dL   RDW 14.9 11.5 - 15.5 %   Platelets 152 150 - 400 K/uL   nRBC 0.0 0.0 - 0.2 %   Neutrophils Relative % 81 %   Neutro Abs 5.2 1.7 - 7.7 K/uL   Lymphocytes Relative 12 %   Lymphs Abs 0.8 0.7 - 4.0 K/uL   Monocytes Relative 5 %   Monocytes Absolute 0.3 0.1 - 1.0 K/uL   Eosinophils Relative 1 %   Eosinophils Absolute 0.1 0.0 - 0.5 K/uL   Basophils Relative 0 %   Basophils Absolute 0.0 0.0 - 0.1 K/uL   Immature Granulocytes 1 %   Abs Immature Granulocytes 0.07 0.00 - 0.07 K/uL  Magnesium     Status: None   Collection Time: 07/15/20  3:21 AM  Result Value Ref Range   Magnesium 1.7 1.7 - 2.4 mg/dL    Recent Results (from the past 240  hour(s))  Resp Panel by RT-PCR (Flu A&B, Covid) Nasopharyngeal Swab     Status: None   Collection Time: 07/13/20  8:17 PM   Specimen: Nasopharyngeal Swab; Nasopharyngeal(NP) swabs in vial transport medium  Result Value Ref Range Status   SARS Coronavirus 2 by RT PCR NEGATIVE NEGATIVE Final    Comment: (NOTE) SARS-CoV-2 target nucleic acids are NOT DETECTED.  The SARS-CoV-2 RNA is generally detectable in upper respiratory specimens during the acute phase of infection. The lowest concentration of SARS-CoV-2 viral copies this assay can detect  is 138 copies/mL. A negative result does not preclude SARS-Cov-2 infection and should not be used as the sole basis for treatment or other patient management decisions. A negative result may occur with  improper specimen collection/handling, submission of specimen other than nasopharyngeal swab, presence of viral mutation(s) within the areas targeted by this assay, and inadequate number of viral copies(<138 copies/mL). A negative result must be combined with clinical observations, patient history, and epidemiological information. The expected result is Negative.  Fact Sheet for Patients:  EntrepreneurPulse.com.au  Fact Sheet for Healthcare Providers:  IncredibleEmployment.be  This test is no t yet approved or cleared by the [place] FDA and  has been authorized for detection and/or diagnosis of SARS-CoV-2 by FDA under an Emergency Use Authorization (EUA). This EUA will remain  in effect (meaning this test can be used) for the duration of the COVID-19 declaration under Section 564(b)(1) of the Act, 21 U.S.C.section 360bbb-3(b)(1), unless the authorization is terminated  or revoked sooner.       Influenza A by PCR NEGATIVE NEGATIVE Final   Influenza B by PCR NEGATIVE NEGATIVE Final    Comment: (NOTE) The Xpert Xpress SARS-CoV-2/FLU/RSV plus assay is intended as an aid in the diagnosis of influenza from  Nasopharyngeal swab specimens and should not be used as a sole basis for treatment. Nasal washings and aspirates are unacceptable for Xpert Xpress SARS-CoV-2/FLU/RSV testing.  Fact Sheet for Patients: EntrepreneurPulse.com.au  Fact Sheet for Healthcare Providers: IncredibleEmployment.be  This test is not yet approved or cleared by the [place] FDA and has been authorized for detection and/or diagnosis of SARS-CoV-2 by FDA under an Emergency Use Authorization (EUA). This EUA will remain in effect (meaning this test can be used) for the duration of the COVID-19 declaration under Section 564(b)(1) of the Act, 21 U.S.C. section 360bbb-3(b)(1), unless the authorization is terminated or revoked.  Performed at [facility], [place] [address] [place], [place] [address]      Radiology Studies: DG Ribs Unilateral W/Chest Left  Result Date: 07/13/2020 CLINICAL DATA:  Status post fall with left-sided rib pain. EXAM: LEFT RIBS AND CHEST - 3+ VIEW COMPARISON:  July 23, 2018 FINDINGS: The lungs are hyperinflated. Moderate severity emphysematous lung disease is seen. Mild diffuse chronic appearing increased interstitial lung markings are noted. Mild atelectasis and/or infiltrate is also seen within the right upper lobe. There is no evidence of a pleural effusion or pneumothorax. A stable 6 mm calcified lung nodule is seen within the mid to upper left lung. Nondisplaced seventh and eighth left rib fractures are seen. There is no evidence of pneumothorax or pleural effusion. Heart size and mediastinal contours are within normal limits. Radiopaque surgical clips are seen within the right upper quadrant. IMPRESSION: 1. COPD and chronic interstitial lung disease with mild right upper lobe atelectasis and/or infiltrate. 2. Nondisplaced seventh and eighth left rib fractures. Electronically Signed   By: Virgina Norfolk M.D.   On: 07/13/2020 18:19   DG Ribs  Unilateral W/Chest Left  Final Result      Scheduled Meds: . ARIPiprazole  2 mg Oral Daily  . buPROPion  450 mg Oral Daily  . busPIRone  30 mg Oral TID  . donepezil  5 mg Oral QHS  . feeding supplement  237 mL Oral BID BM  . folic acid  1 mg Oral QHS  . heparin  5,000 Units Subcutaneous Q8H  . levothyroxine  25 mcg Oral QAC breakfast  . lidocaine  1 patch Transdermal Q24H  .  multivitamin with minerals  1 tablet Oral Daily  . pantoprazole  40 mg Oral Daily  . tamsulosin  0.4 mg Oral Daily   PRN Meds: acetaminophen **OR** acetaminophen, albuterol, ondansetron **OR** ondansetron (ZOFRAN) IV, oxyCODONE, polyethylene glycol, traZODone Continuous Infusions:   LOS: 2 days  Time spent: Greater than 50% of the 35 minute visit was spent in counseling/coordination of care for the patient as laid out in the A&P.   Dwyane Dee, MD [facility] 07/15/2020, 1:50 PM

## 2020-07-15 NOTE — NC FL2 (Addendum)
[place] LEVEL OF CARE SCREENING TOOL     IDENTIFICATION  Patient Name: Traci [name] Birthdate: 1946-08-01 Sex: female Admission Date (Current Location): 07/13/2020  [facility] and [place] Number:  [employer] and Address:  [facility] [place]. [facility], [place] [address] [place], [place] [address]      Provider Number: [id number]  Attending Physician Name and Address:  Dwyane Dee, MD  Relative Name and Phone Number:  Marcelline Deist [phone]    Current Level of Care: Hospital Recommended Level of Care: [place] Prior Approval Number:    Date Approved/Denied:   PASRR Number: [id number]  Discharge Plan: SNF    Current Diagnoses: Patient Active Problem List   Diagnosis Date Noted   Opioid dependence ([place]) 07/15/2020   Rib fracture 07/14/2020   Hypoxia    Sacral fracture, closed ([place])    Hip fracture ([place]) 11/03/2018   AKI (acute kidney injury) ([place]) 07/23/2018   Fall 07/23/2018   Abdominal pain    Contusion of left chest wall    Severe aortic insufficiency 02/04/2018   Tremor    Hypertension    Hyperlipidemia    H/O hiatal hernia    GERD (gastroesophageal reflux disease)    Dyspepsia    Depression    COPD (chronic obstructive pulmonary disease) (HCC)    Chronic lower back pain    Chronic bronchitis (HCC)    Arthritis    Anxiety    Anginal pain ([place])    DNR (do not resuscitate) 02/13/2017   Shortness of breath 09/14/2016   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    Acute on chronic diastolic CHF (congestive heart failure), NYHA class 3 ([place])    CHF (congestive heart failure) ([place]) 08/16/2016   Anemia 08/16/2016   Chronic back pain 08/16/2016   Hypothyroidism 08/16/2016   Benign essential tremor 08/16/2016   Sinus tachycardia 03/31/2014   Fractured tibia and fibula 03/26/2014   Right tibial fracture 03/26/2014   Closed right tibial fracture 03/26/2014    Unstable gait 03/26/2014   Chest pain syndrome 02/12/2012   Spinal stenosis 09/24/2011   HEARTBURN 08/03/2010   BENIGN NEOPLASM OF ADRENAL GLAND 07/28/2010   Anxiety disorder 07/28/2010   MYOCARDIAL INFARCTION, HX OF 07/28/2010   Chronic obstructive pulmonary disease ([place]) 07/28/2010   DIVERTICULOSIS, COLON 07/28/2010   COLONIC POLYPS, ADENOMATOUS, HX OF 07/28/2010   HLD (hyperlipidemia) 06/23/2010   HTN (hypertension) 06/23/2010   ACUTE DILATATION OF STOMACH 06/23/2010   ABDOMINAL PAIN, GENERALIZED 06/23/2010   History of blood transfusion 08/20/1961    Orientation RESPIRATION BLADDER Height & Weight     Self, Time  Normal Continent, External catheter Weight: 137 lb 12.6 oz (62.5 kg) Height:  5\' 9"  (175.3 cm)  BEHAVIORAL SYMPTOMS/MOOD NEUROLOGICAL BOWEL NUTRITION STATUS      Continent Diet (see dc summary)  AMBULATORY STATUS COMMUNICATION OF NEEDS Skin   Extensive Assist Verbally Other (Comment) (Skin tear hand, posterier right 07/13/20, Skin teararm lower posterior right 07/13/20)                       Personal Care Assistance Level of Assistance  Bathing, Feeding, Dressing Bathing Assistance: Limited assistance Feeding assistance: Independent Dressing Assistance: Limited assistance     Functional Limitations Info  Sight, Hearing, Speech Sight Info: Adequate Hearing Info: Adequate Speech Info: Adequate    SPECIAL CARE FACTORS FREQUENCY  PT (By licensed PT), OT (By licensed OT)  PT Frequency: 5x week OT Frequency: 5x week            Contractures Contractures Info: Not present    Additional Factors Info  Code Status, Allergies, Psychotropic Code Status Info: DNR Allergies Info: Cephalexin Aspirin Cymbalta (Duloxetine Hcl) Latex Psychotropic Info: buPROPion (WELLBUTRIN XL) 24 hr tablet 450 mg daily, ARIPiprazole (ABILIFY) tablet 2 mg daily, busPIRone (BUSPAR) tablet 30 mg 3x daily,traZODone (DESYREL) tablet 100 mg nightly          Current Medications (07/15/2020):  This is the current hospital active medication list Current Facility-Administered Medications  Medication Dose Route Frequency Provider Last Rate Last Admin   acetaminophen (TYLENOL) tablet 650 mg  650 mg Oral Q6H PRN Zierle-Ghosh, Asia B, DO       Or   acetaminophen (TYLENOL) suppository 650 mg  650 mg Rectal Q6H PRN Zierle-Ghosh, Asia B, DO       albuterol (VENTOLIN HFA) 108 (90 Base) MCG/ACT inhaler 2 puff  2 puff Inhalation Q6H PRN Zierle-Ghosh, Asia B, DO       ARIPiprazole (ABILIFY) tablet 2 mg  2 mg Oral Daily Zierle-Ghosh, Asia B, DO   2 mg at 07/15/20 0813   buPROPion (WELLBUTRIN XL) 24 hr tablet 450 mg  450 mg Oral Daily Zierle-Ghosh, Asia B, DO   450 mg at 07/15/20 0814   busPIRone (BUSPAR) tablet 30 mg  30 mg Oral TID Zierle-Ghosh, Asia B, DO   30 mg at 07/15/20 1514   donepezil (ARICEPT) tablet 5 mg  5 mg Oral QHS Zierle-Ghosh, Asia B, DO   5 mg at 07/15/20 0005   feeding supplement (ENSURE ENLIVE / ENSURE PLUS) liquid 237 mL  237 mL Oral BID BM Zierle-Ghosh, Asia B, DO   237 mL at 54/65/68 1275   folic acid (FOLVITE) tablet 1 mg  1 mg Oral QHS Zierle-Ghosh, Asia B, DO   1 mg at 07/15/20 0005   heparin injection 5,000 Units  5,000 Units Subcutaneous Q8H Zierle-Ghosh, Asia B, DO   5,000 Units at 07/15/20 1515   levothyroxine (SYNTHROID) tablet 25 mcg  25 mcg Oral QAC breakfast Zierle-Ghosh, Asia B, DO   25 mcg at 07/15/20 0551   lidocaine (LIDODERM) 5 % 1 patch  1 patch Transdermal Q24H Zierle-Ghosh, Asia B, DO   1 patch at 07/15/20 1514   multivitamin with minerals tablet 1 tablet  1 tablet Oral Daily Zierle-Ghosh, Asia B, DO   1 tablet at 07/15/20 0812   ondansetron (ZOFRAN) tablet 4 mg  4 mg Oral Q6H PRN Zierle-Ghosh, Asia B, DO       Or   ondansetron (ZOFRAN) injection 4 mg  4 mg Intravenous Q6H PRN Zierle-Ghosh, Asia B, DO       oxyCODONE (Oxy IR/ROXICODONE) immediate release tablet 5 mg  5 mg Oral Q6H PRN Dwyane Dee, MD    5 mg at 07/15/20 1309   pantoprazole (PROTONIX) EC tablet 40 mg  40 mg Oral Daily Zierle-Ghosh, Asia B, DO   40 mg at 07/15/20 0813   polyethylene glycol (MIRALAX / GLYCOLAX) packet 17 g  17 g Oral Daily PRN Zierle-Ghosh, Asia B, DO       tamsulosin (FLOMAX) capsule 0.4 mg  0.4 mg Oral Daily Zierle-Ghosh, Asia B, DO   0.4 mg at 07/15/20 0813   traZODone (DESYREL) tablet 100 mg  100 mg Oral QHS PRN Zierle-Ghosh, Asia B, DO   100 mg at 07/15/20 1700     Discharge Medications: Please see discharge summary  for a list of discharge medications.  Relevant Imaging Results:  Relevant Lab Results:   Additional Information  SSN: [id number]  Loreta Ave, LCSWA

## 2020-07-15 NOTE — TOC Initial Note (Addendum)
Transition of Care [facility]) - Initial/Assessment Note    Patient Details  Name: Traci [name] MRN: [id number] Date of Birth: 10-09-1945  [facility]) CM/SW Contact:    Loreta Ave, [place] Phone Number: 07/15/2020, 2:45 PM  Clinical Narrative:                 CSW spoke with pt's daughter Traci [name] in reference to possible SNF placement at time of dc. Traci [name] states she does believe pt needs SNF as pt's spouse is 43 and unable to take care of pt. CSW explained that this placement would be for short term and SNF of choice may be able to offer long term placement if possible in the future. Traci [name] is hoping pt can get into [facility] in [place] as pt has been there before. Pt has received both vaccines and the booster. CSW discussed Medicare.gov ratings list and insurance auth process. CSW will continue to follow.         Patient Goals and CMS Choice        Expected Discharge Plan and Services                                                Prior Living Arrangements/Services                       Activities of Daily Living      Permission Sought/Granted                  Emotional Assessment              Admission diagnosis:  Hypoxia [R09.02] AKI (acute kidney injury) ([place]) [N17.9] Fall, initial encounter [W19.XXXA] Closed fracture of multiple ribs of left side, initial encounter [S22.42XA] Hypotension, unspecified hypotension type [I95.9] Patient Active Problem List   Diagnosis Date Noted  . Opioid dependence ([place]) 07/15/2020  . Rib fracture 07/14/2020  . Hypoxia   . Sacral fracture, closed ([place])   . Hip fracture ([place]) 11/03/2018  . AKI (acute kidney injury) ([place]) 07/23/2018  . Fall 07/23/2018  . Abdominal pain   . Contusion of left chest wall   . Severe aortic insufficiency 02/04/2018  . Tremor   . Hypertension   . Hyperlipidemia   . H/O hiatal hernia   . GERD (gastroesophageal reflux disease)   . Dyspepsia    . Depression   . COPD (chronic obstructive pulmonary disease) ([place])   . Chronic lower back pain   . Chronic bronchitis ([place])   . Arthritis   . Anxiety   . Anginal pain ([place])   . DNR (do not resuscitate) 02/13/2017  . Shortness of breath 09/14/2016  . 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 Acute on chronic diastolic CHF (congestive heart failure), NYHA class 3 ([place])   . CHF (congestive heart failure) ([place]) 08/16/2016  . Anemia 08/16/2016  . Chronic back pain 08/16/2016  . Hypothyroidism 08/16/2016  . Benign essential tremor 08/16/2016  . Sinus tachycardia 03/31/2014  . Fractured tibia and fibula 03/26/2014  . Right tibial fracture 03/26/2014  . Closed right tibial fracture 03/26/2014  . Unstable gait 03/26/2014  . Chest pain syndrome 02/12/2012  . Spinal stenosis 09/24/2011  . HEARTBURN 08/03/2010  . BENIGN NEOPLASM OF ADRENAL GLAND 07/28/2010  . Anxiety disorder 07/28/2010  .  MYOCARDIAL INFARCTION, HX OF 07/28/2010  . Chronic obstructive pulmonary disease ([place]) 07/28/2010  . DIVERTICULOSIS, COLON 07/28/2010  . COLONIC POLYPS, ADENOMATOUS, HX OF 07/28/2010  . HLD (hyperlipidemia) 06/23/2010  . HTN (hypertension) 06/23/2010  . ACUTE DILATATION OF STOMACH 06/23/2010  . ABDOMINAL PAIN, GENERALIZED 06/23/2010  . History of blood transfusion 08/20/1961   PCP:  Bonnita Nasuti, MD Pharmacy:   [facility], [place] [address] Phone: [phone] Fax: [phone]     Social Determinants of Health (SDOH) Interventions    Readmission Risk Interventions No flowsheet data found.

## 2020-07-15 NOTE — TOC CAGE-AID Note (Signed)
Transition of Care [facility]) - CAGE-AID Screening   Patient Details  Name: Traci [name] MRN: [id number] Date of Birth: 05/26/1946  [facility]) CM/SW Contact:    Emeterio Reeve, [place] Phone Number: 07/15/2020, 10:33 AM   Clinical Narrative:  Pt is unable to participate in assessment due to only being oriented x2.   CAGE-AID Screening: Substance Abuse Screening unable to be completed due to: : Patient unable to participate               Providence Crosby Clinical Social Worker [phone]

## 2020-07-16 DIAGNOSIS — S2242XA Multiple fractures of ribs, left side, initial encounter for closed fracture: Secondary | ICD-10-CM | POA: Diagnosis not present

## 2020-07-16 DIAGNOSIS — W19XXXA Unspecified fall, initial encounter: Secondary | ICD-10-CM | POA: Diagnosis not present

## 2020-07-16 DIAGNOSIS — N179 Acute kidney failure, unspecified: Secondary | ICD-10-CM | POA: Diagnosis not present

## 2020-07-16 LAB — CBC WITH DIFFERENTIAL/PLATELET
Abs Immature Granulocytes: 0.01 10*3/uL (ref 0.00–0.07)
Basophils Absolute: 0 10*3/uL (ref 0.0–0.1)
Basophils Relative: 0 %
Eosinophils Absolute: 0.1 10*3/uL (ref 0.0–0.5)
Eosinophils Relative: 2 %
HCT: 30.1 % — ABNORMAL LOW (ref 36.0–46.0)
Hemoglobin: 9.2 g/dL — ABNORMAL LOW (ref 12.0–15.0)
Immature Granulocytes: 0 %
Lymphocytes Relative: 18 %
Lymphs Abs: 0.9 10*3/uL (ref 0.7–4.0)
MCH: 27 pg (ref 26.0–34.0)
MCHC: 30.6 g/dL (ref 30.0–36.0)
MCV: 88.3 fL (ref 80.0–100.0)
Monocytes Absolute: 0.3 10*3/uL (ref 0.1–1.0)
Monocytes Relative: 7 %
Neutro Abs: 3.7 10*3/uL (ref 1.7–7.7)
Neutrophils Relative %: 73 %
Platelets: 159 10*3/uL (ref 150–400)
RBC: 3.41 MIL/uL — ABNORMAL LOW (ref 3.87–5.11)
RDW: 15 % (ref 11.5–15.5)
WBC: 5 10*3/uL (ref 4.0–10.5)
nRBC: 0 % (ref 0.0–0.2)

## 2020-07-16 LAB — BASIC METABOLIC PANEL
Anion gap: 10 (ref 5–15)
BUN: 10 mg/dL (ref 8–23)
CO2: 27 mmol/L (ref 22–32)
Calcium: 8.5 mg/dL — ABNORMAL LOW (ref 8.9–10.3)
Chloride: 105 mmol/L (ref 98–111)
Creatinine, Ser: 0.98 mg/dL (ref 0.44–1.00)
GFR, Estimated: 60 mL/min — ABNORMAL LOW (ref >60)
Glucose, Bld: 73 mg/dL (ref 70–99)
Potassium: 4 mmol/L (ref 3.5–5.1)
Sodium: 142 mmol/L (ref 135–145)

## 2020-07-16 LAB — MAGNESIUM: Magnesium: 1.9 mg/dL (ref 1.7–2.4)

## 2020-07-16 NOTE — Progress Notes (Signed)
PROGRESS NOTE    Traci [name]   OXB:[id number]  DOB: 1946/04/01  DOA: 07/13/2020     3  PCP: Bonnita Nasuti, MD  CC: Fall at home  [facility] Course: Traci [name]  is a 74 y.o. female, with history of hypothyroidism, hypertension, hyperlipidemia, GERD, COPD, chronic lower back pain, anxiety who presented to the ER with a chief complaint of fall.   Patient reports that she fell during the night between 11/23 and 11/24 after attempting to transfer from her chair to her wheelchair.  She has been having pain in her arms and chest wall since. Her appetite and nutritional intake has been extremely poor; often times only eating 1 meal a day and minimal sips of water. Due to her worsening weakness and fall at home, she was brought to the ER for further evaluation and evaluation by PT. She was evaluated by physical therapy with recommendations for SNF at discharge.  She improved with fluid resuscitation and supportive care.   Interval History:  No events overnight.  This morning she was stating wanting to go home but after reminding her about the need for rehab, she remembered and agreed.  Otherwise was resting comfortably in chair bedside.  Still having ongoing left chest wall pain and discussed with her that the rib fractures would take several weeks possibly months to heal for her.   Old records reviewed in assessment of this patient  ROS: Constitutional: positive for fatigue and malaise, Cardiovascular: negative for chest pain, Gastrointestinal: negative for abdominal pain and Genitourinary:negative for dysuria  Assessment & Plan: Fall -Mechanical fall at home.  Etiology considered due to worsening frailty/deconditioning as well as opioid use.  Database reviewed, has not filled alprazolam since 03/31/2020.  Check UDS (positive for benzos and opiates). Holding off on benzo still for now with close monitoring for any w/d signs  - PT/OT eval  AKI (acute kidney injury) (HCC)-resolved as of  07/16/2020 -Considered prerenal due to poor oral intake - improved with IVF, continue encouraging good oral intake -Daily BMP  Opioid dependence ([place]) -Last filled oxycodone on 06/18/2020.  Tablets are 15 mg and she receives 120 for quantity.  Discussed bedside on 07/15/2020, she is actually cutting her tablet in approximately 3 or 4 pieces and takes them about 4 times a day -Decrease oxycodone to 5 mg q6h PRN; we also discussed further decreasing of her dosage and frequency while in the hospital, she seems amenable to this for now; pain is chronic back pain mostly   Rib fracture - due to fall; CXR shows Left 7th and 8th rib fx - continue supportive care - already on oxy at home; d/c morphine as patient too frail and does not appear to be in severe pain   Hypothyroidism -TSH normal -Continue Synthroid  Hypotension-resolved as of 07/14/2020 -Considered in setting of pain medication and dehydration -Hold sedating medications as able  Acute respiratory failure with hypoxia (HCC)-resolved as of 07/15/2020 -Weaned to RA -CXR shows atelectasis. -Incentive spirometer ordered   Antimicrobials: None  DVT prophylaxis: HSQ Code Status: DNR Family Communication: none present Disposition Plan: Status is: Inpatient  Remains inpatient appropriate because:Unsafe d/c plan, IV treatments appropriate due to intensity of illness or inability to take PO and Inpatient level of care appropriate due to severity of illness   Dispo: The patient is from: Home              Anticipated d/c is to: SNF  Anticipated d/c date is: When bed available              Patient currently is medically stable to d/c.  Objective: Blood pressure (!) 118/55, pulse 87, temperature 97.8 F (36.6 C), temperature source Oral, resp. rate 18, height 5\' 9"  (1.753 m), weight 61 kg, SpO2 98 %.  Examination: General appearance: Pleasant but chronically ill-appearing frail elderly woman sitting up in chair in no  distress Head: Normocephalic, without obvious abnormality, atraumatic Eyes: EOMI Lungs: clear to auscultation bilaterally Heart: regular rate and rhythm and S1, S2 normal Abdomen: normal findings: bowel sounds normal and soft, non-tender Extremities: Lower extremity edema noted Skin: Scattered bruising and senile purpura Neurologic: Diffuse weakness but no obvious focal deficits  Consultants:   None  Procedures:   None  Data Reviewed: I have personally reviewed following labs and imaging studies Results for orders placed or performed during the hospital encounter of 07/13/20 (from the past 24 hour(s))  Basic metabolic panel     Status: Abnormal   Collection Time: 07/16/20  4:29 AM  Result Value Ref Range   Sodium 142 135 - 145 mmol/L   Potassium 4.0 3.5 - 5.1 mmol/L   Chloride 105 98 - 111 mmol/L   CO2 27 22 - 32 mmol/L   Glucose, Bld 73 70 - 99 mg/dL   BUN 10 8 - 23 mg/dL   Creatinine, Ser 0.98 0.44 - 1.00 mg/dL   Calcium 8.5 (L) 8.9 - 10.3 mg/dL   GFR, Estimated 60 (L) >60 mL/min   Anion gap 10 5 - 15  CBC with Differential/Platelet     Status: Abnormal   Collection Time: 07/16/20  4:29 AM  Result Value Ref Range   WBC 5.0 4.0 - 10.5 K/uL   RBC 3.41 (L) 3.87 - 5.11 MIL/uL   Hemoglobin 9.2 (L) 12.0 - 15.0 g/dL   HCT 30.1 (L) 36 - 46 %   MCV 88.3 80.0 - 100.0 fL   MCH 27.0 26.0 - 34.0 pg   MCHC 30.6 30.0 - 36.0 g/dL   RDW 15.0 11.5 - 15.5 %   Platelets 159 150 - 400 K/uL   nRBC 0.0 0.0 - 0.2 %   Neutrophils Relative % 73 %   Neutro Abs 3.7 1.7 - 7.7 K/uL   Lymphocytes Relative 18 %   Lymphs Abs 0.9 0.7 - 4.0 K/uL   Monocytes Relative 7 %   Monocytes Absolute 0.3 0.1 - 1.0 K/uL   Eosinophils Relative 2 %   Eosinophils Absolute 0.1 0.0 - 0.5 K/uL   Basophils Relative 0 %   Basophils Absolute 0.0 0.0 - 0.1 K/uL   Immature Granulocytes 0 %   Abs Immature Granulocytes 0.01 0.00 - 0.07 K/uL  Magnesium     Status: None   Collection Time: 07/16/20  4:29 AM  Result  Value Ref Range   Magnesium 1.9 1.7 - 2.4 mg/dL    Recent Results (from the past 240 hour(s))  Resp Panel by RT-PCR (Flu A&B, Covid) Nasopharyngeal Swab     Status: None   Collection Time: 07/13/20  8:17 PM   Specimen: Nasopharyngeal Swab; Nasopharyngeal(NP) swabs in vial transport medium  Result Value Ref Range Status   SARS Coronavirus 2 by RT PCR NEGATIVE NEGATIVE Final    Comment: (NOTE) SARS-CoV-2 target nucleic acids are NOT DETECTED.  The SARS-CoV-2 RNA is generally detectable in upper respiratory specimens during the acute phase of infection. The lowest concentration of SARS-CoV-2 viral copies this assay can  detect is 138 copies/mL. A negative result does not preclude SARS-Cov-2 infection and should not be used as the sole basis for treatment or other patient management decisions. A negative result may occur with  improper specimen collection/handling, submission of specimen other than nasopharyngeal swab, presence of viral mutation(s) within the areas targeted by this assay, and inadequate number of viral copies(<138 copies/mL). A negative result must be combined with clinical observations, patient history, and epidemiological information. The expected result is Negative.  Fact Sheet for Patients:  EntrepreneurPulse.com.au  Fact Sheet for Healthcare Providers:  IncredibleEmployment.be  This test is no t yet approved or cleared by the [place] FDA and  has been authorized for detection and/or diagnosis of SARS-CoV-2 by FDA under an Emergency Use Authorization (EUA). This EUA will remain  in effect (meaning this test can be used) for the duration of the COVID-19 declaration under Section 564(b)(1) of the Act, 21 U.S.C.section 360bbb-3(b)(1), unless the authorization is terminated  or revoked sooner.       Influenza A by PCR NEGATIVE NEGATIVE Final   Influenza B by PCR NEGATIVE NEGATIVE Final    Comment: (NOTE) The Xpert Xpress  SARS-CoV-2/FLU/RSV plus assay is intended as an aid in the diagnosis of influenza from Nasopharyngeal swab specimens and should not be used as a sole basis for treatment. Nasal washings and aspirates are unacceptable for Xpert Xpress SARS-CoV-2/FLU/RSV testing.  Fact Sheet for Patients: EntrepreneurPulse.com.au  Fact Sheet for Healthcare Providers: IncredibleEmployment.be  This test is not yet approved or cleared by the [place] FDA and has been authorized for detection and/or diagnosis of SARS-CoV-2 by FDA under an Emergency Use Authorization (EUA). This EUA will remain in effect (meaning this test can be used) for the duration of the COVID-19 declaration under Section 564(b)(1) of the Act, 21 U.S.C. section 360bbb-3(b)(1), unless the authorization is terminated or revoked.  Performed at [facility], [place] [address], [place], [place] [address]      Radiology Studies: No results found. DG Ribs Unilateral W/Chest Left  Final Result      Scheduled Meds: . ARIPiprazole  2 mg Oral Daily  . buPROPion  450 mg Oral Daily  . busPIRone  30 mg Oral TID  . donepezil  5 mg Oral QHS  . feeding supplement  237 mL Oral BID BM  . folic acid  1 mg Oral QHS  . heparin  5,000 Units Subcutaneous Q8H  . levothyroxine  25 mcg Oral QAC breakfast  . lidocaine  1 patch Transdermal Q24H  . multivitamin with minerals  1 tablet Oral Daily  . pantoprazole  40 mg Oral Daily  . tamsulosin  0.4 mg Oral Daily   PRN Meds: acetaminophen **OR** acetaminophen, albuterol, ondansetron **OR** ondansetron (ZOFRAN) IV, oxyCODONE, polyethylene glycol, traZODone Continuous Infusions:   LOS: 3 days  Time spent: Greater than 50% of the 35 minute visit was spent in counseling/coordination of care for the patient as laid out in the A&P.   Dwyane Dee, MD [facility] 07/16/2020, 3:29 PM

## 2020-07-16 NOTE — TOC Progression Note (Signed)
Transition of Care [facility]) - Progression Note    Patient Details  Name: Traci [name] MRN: [id number] Date of Birth: 03-11-1946  Transition of Care [facility]) CM/SW [place], [place] Phone Number: [phone] 07/16/2020, 9:13 AM  Clinical Narrative:     CSW was alerted that patient is medically ready for discharge however currently does not have any bed offers.  CSW will follow for bed offers.  TOC team will continue to assist with discharge planning needs.  Expected Discharge Plan: [place] Barriers to Discharge: Continued Medical Work up, Ship broker  Expected Discharge Plan and Services Expected Discharge Plan: [place] In-house Referral: Clinical Social Work     Living arrangements for the past 2 months: Single Family Home                                       Social Determinants of Health (SDOH) Interventions    Readmission Risk Interventions No flowsheet data found.

## 2020-07-17 DIAGNOSIS — F1120 Opioid dependence, uncomplicated: Secondary | ICD-10-CM | POA: Diagnosis not present

## 2020-07-17 DIAGNOSIS — N179 Acute kidney failure, unspecified: Secondary | ICD-10-CM | POA: Diagnosis not present

## 2020-07-17 DIAGNOSIS — W19XXXA Unspecified fall, initial encounter: Secondary | ICD-10-CM | POA: Diagnosis not present

## 2020-07-17 DIAGNOSIS — J9601 Acute respiratory failure with hypoxia: Secondary | ICD-10-CM | POA: Diagnosis not present

## 2020-07-17 MED ORDER — IPRATROPIUM-ALBUTEROL 0.5-2.5 (3) MG/3ML IN SOLN
3.0000 mL | Freq: Two times a day (BID) | RESPIRATORY_TRACT | Status: DC
  Administered 2020-07-18 – 2020-07-19 (×4): 3 mL via RESPIRATORY_TRACT
  Filled 2020-07-17 (×5): qty 3

## 2020-07-17 MED ORDER — IPRATROPIUM-ALBUTEROL 0.5-2.5 (3) MG/3ML IN SOLN
3.0000 mL | Freq: Four times a day (QID) | RESPIRATORY_TRACT | Status: DC | PRN
  Filled 2020-07-17: qty 3

## 2020-07-17 MED ORDER — IPRATROPIUM-ALBUTEROL 0.5-2.5 (3) MG/3ML IN SOLN
3.0000 mL | Freq: Four times a day (QID) | RESPIRATORY_TRACT | Status: DC
  Administered 2020-07-17 (×2): 3 mL via RESPIRATORY_TRACT
  Filled 2020-07-17 (×2): qty 3

## 2020-07-17 NOTE — Progress Notes (Signed)
PROGRESS NOTE    Traci [name]   DDU:[id number]  DOB: 09-01-1945  DOA: 07/13/2020     4  PCP: Bonnita Nasuti, MD  CC: Fall at home  [facility] Course: Traci [name]  is a 74 y.o. female, with history of hypothyroidism, hypertension, hyperlipidemia, GERD, COPD, chronic lower back pain, anxiety who presented to the ER with a chief complaint of fall.   Patient reports that she fell during the night between 11/23 and 11/24 after attempting to transfer from her chair to her wheelchair.  She has been having pain in her arms and chest wall since. Her appetite and nutritional intake has been extremely poor; often times only eating 1 meal a day and minimal sips of water. Due to her worsening weakness and fall at home, she was brought to the ER for further evaluation and evaluation by PT. She was evaluated by physical therapy with recommendations for SNF at discharge.  She improved with fluid resuscitation and supportive care.   Interval History:  No events overnight.  Resting in bed comfortable this morning.  Still awaiting disposition planning.  Bed offers not accepted by family due to distance at this time.  Old records reviewed in assessment of this patient  ROS: Constitutional: positive for fatigue and malaise, Cardiovascular: negative for chest pain, Gastrointestinal: negative for abdominal pain and Genitourinary:negative for dysuria  Assessment & Plan: Fall -Mechanical fall at home.  Etiology considered due to worsening frailty/deconditioning as well as opioid use.  Database reviewed, has not filled alprazolam since 03/31/2020.  Check UDS (positive for benzos and opiates). Holding off on benzo still for now with close monitoring for any w/d signs  - PT/OT eval: rec's for SNF. Awaiting placement   AKI (acute kidney injury) (HCC)-resolved as of 07/16/2020 -Considered prerenal due to poor oral intake - improved with IVF, continue encouraging good oral intake -Daily BMP  Opioid  dependence ([place]) -Last filled oxycodone on 06/18/2020.  Tablets are 15 mg and she receives 120 for quantity.  Discussed bedside on 07/15/2020, she is actually cutting her tablet in approximately 3 or 4 pieces and takes them about 4 times a day -Decrease oxycodone to 5 mg q6h PRN; we also discussed further decreasing of her dosage and frequency while in the hospital, she seems amenable to this for now; pain is chronic back pain mostly   Rib fracture - due to fall; CXR shows Left 7th and 8th rib fx - continue supportive care - already on oxy at home; d/c morphine as patient too frail and does not appear to be in severe pain   Hypothyroidism -TSH normal -Continue Synthroid  Hypotension-resolved as of 07/14/2020 -Considered in setting of pain medication and dehydration -Hold sedating medications as able  Acute respiratory failure with hypoxia (HCC)-resolved as of 07/15/2020 -Weaned to RA -CXR shows atelectasis. -Incentive spirometer ordered   Antimicrobials: None  DVT prophylaxis: HSQ Code Status: DNR Family Communication: none present Disposition Plan: Status is: Inpatient  Remains inpatient appropriate because:Unsafe d/c plan, IV treatments appropriate due to intensity of illness or inability to take PO and Inpatient level of care appropriate due to severity of illness   Dispo: The patient is from: Home              Anticipated d/c is to: SNF              Anticipated d/c date is: When bed available              Patient currently  is medically stable to d/c.  Objective: Blood pressure (!) 162/71, pulse 93, temperature 97.7 F (36.5 C), temperature source Oral, resp. rate 18, height 5\' 9"  (1.753 m), weight 62.4 kg, SpO2 96 %.  Examination: General appearance: Pleasant but chronically ill-appearing frail elderly woman resting in bed in no distress Head: Normocephalic, without obvious abnormality, atraumatic Eyes: EOMI Lungs: clear to auscultation bilaterally Heart: regular  rate and rhythm and S1, S2 normal Abdomen: normal findings: bowel sounds normal and soft, non-tender Extremities: Lower extremity edema noted Skin: Scattered bruising and senile purpura Neurologic: Diffuse weakness but no obvious focal deficits  Consultants:   None  Procedures:   None  Data Reviewed: I have personally reviewed following labs and imaging studies No results found for this or any previous visit (from the past 24 hour(s)).  Recent Results (from the past 240 hour(s))  Resp Panel by RT-PCR (Flu A&B, Covid) Nasopharyngeal Swab     Status: None   Collection Time: 07/13/20  8:17 PM   Specimen: Nasopharyngeal Swab; Nasopharyngeal(NP) swabs in vial transport medium  Result Value Ref Range Status   SARS Coronavirus 2 by RT PCR NEGATIVE NEGATIVE Final    Comment: (NOTE) SARS-CoV-2 target nucleic acids are NOT DETECTED.  The SARS-CoV-2 RNA is generally detectable in upper respiratory specimens during the acute phase of infection. The lowest concentration of SARS-CoV-2 viral copies this assay can detect is 138 copies/mL. A negative result does not preclude SARS-Cov-2 infection and should not be used as the sole basis for treatment or other patient management decisions. A negative result may occur with  improper specimen collection/handling, submission of specimen other than nasopharyngeal swab, presence of viral mutation(s) within the areas targeted by this assay, and inadequate number of viral copies(<138 copies/mL). A negative result must be combined with clinical observations, patient history, and epidemiological information. The expected result is Negative.  Fact Sheet for Patients:  EntrepreneurPulse.com.au  Fact Sheet for Healthcare Providers:  IncredibleEmployment.be  This test is no t yet approved or cleared by the [place] FDA and  has been authorized for detection and/or diagnosis of SARS-CoV-2 by FDA under an Emergency  Use Authorization (EUA). This EUA will remain  in effect (meaning this test can be used) for the duration of the COVID-19 declaration under Section 564(b)(1) of the Act, 21 U.S.C.section 360bbb-3(b)(1), unless the authorization is terminated  or revoked sooner.       Influenza A by PCR NEGATIVE NEGATIVE Final   Influenza B by PCR NEGATIVE NEGATIVE Final    Comment: (NOTE) The Xpert Xpress SARS-CoV-2/FLU/RSV plus assay is intended as an aid in the diagnosis of influenza from Nasopharyngeal swab specimens and should not be used as a sole basis for treatment. Nasal washings and aspirates are unacceptable for Xpert Xpress SARS-CoV-2/FLU/RSV testing.  Fact Sheet for Patients: EntrepreneurPulse.com.au  Fact Sheet for Healthcare Providers: IncredibleEmployment.be  This test is not yet approved or cleared by the [place] FDA and has been authorized for detection and/or diagnosis of SARS-CoV-2 by FDA under an Emergency Use Authorization (EUA). This EUA will remain in effect (meaning this test can be used) for the duration of the COVID-19 declaration under Section 564(b)(1) of the Act, 21 U.S.C. section 360bbb-3(b)(1), unless the authorization is terminated or revoked.  Performed at [facility], [place] [address], [place], [place] [address]      Radiology Studies: No results found. DG Ribs Unilateral W/Chest Left  Final Result      Scheduled Meds: . ARIPiprazole  2 mg Oral Daily  . buPROPion  450 mg Oral Daily  . busPIRone  30 mg Oral TID  . donepezil  5 mg Oral QHS  . feeding supplement  237 mL Oral BID BM  . folic acid  1 mg Oral QHS  . heparin  5,000 Units Subcutaneous Q8H  . ipratropium-albuterol  3 mL Nebulization Q6H  . levothyroxine  25 mcg Oral QAC breakfast  . lidocaine  1 patch Transdermal Q24H  . multivitamin with minerals  1 tablet Oral Daily  . pantoprazole  40 mg Oral Daily  . tamsulosin  0.4 mg Oral Daily   PRN  Meds: acetaminophen **OR** acetaminophen, ipratropium-albuterol, ondansetron **OR** ondansetron (ZOFRAN) IV, oxyCODONE, polyethylene glycol, traZODone Continuous Infusions:   LOS: 4 days  Time spent: Greater than 50% of the 35 minute visit was spent in counseling/coordination of care for the patient as laid out in the A&P.   Dwyane Dee, MD [facility] 07/17/2020, 3:28 PM

## 2020-07-17 NOTE — TOC Progression Note (Signed)
Transition of Care [facility]) - Progression Note    Patient Details  Name: Traci [name] MRN: [id number] Date of Birth: 05-11-1946  Transition of Care [facility]) CM/SW [place], [place] Phone Number: [phone] 07/17/2020, 1:33 PM  Clinical Narrative:     CSW spoke with patient's daughter Marisa Severin to provide the one bed offer of [facility]. Marisa Severin stated that it was too far. CSW let her know that the preferred facility was on hold for admissions.  Marisa Severin informed CSW that the facility in [place] was too far as well.Marisa Severin stated that she prefers [facility] as first choice and then [facility] as second choice.   TOC team will continue to assist with discharge planning needs.   Expected Discharge Plan: [place] Barriers to Discharge: Continued Medical Work up, Ship broker  Expected Discharge Plan and Services Expected Discharge Plan: [place] In-house Referral: Clinical Social Work     Living arrangements for the past 2 months: Single Family Home                                       Social Determinants of Health (SDOH) Interventions    Readmission Risk Interventions No flowsheet data found.

## 2020-07-18 DIAGNOSIS — N179 Acute kidney failure, unspecified: Secondary | ICD-10-CM | POA: Diagnosis not present

## 2020-07-18 DIAGNOSIS — W19XXXA Unspecified fall, initial encounter: Secondary | ICD-10-CM | POA: Diagnosis not present

## 2020-07-18 DIAGNOSIS — J9601 Acute respiratory failure with hypoxia: Secondary | ICD-10-CM | POA: Diagnosis not present

## 2020-07-18 NOTE — Progress Notes (Signed)
PROGRESS NOTE    Traci [name]   KVQ:[id number]  DOB: 03/15/1946  DOA: 07/13/2020     5  PCP: Bonnita Nasuti, MD  CC: Fall at [facility] Traci [name]  is a 74 y.o. female, with history of hypothyroidism, hypertension, hyperlipidemia, GERD, COPD, chronic lower back pain, anxiety who presented to the ER with a chief complaint of fall.   Patient reports that she fell during the night between 11/23 and 11/24 after attempting to transfer from her chair to her wheelchair.  She has been having pain in her arms and chest wall since. Her appetite and nutritional intake has been extremely poor; often times only eating 1 meal a day and minimal sips of water. Due to her worsening weakness and fall at home, she was brought to the ER for further evaluation and evaluation by PT. She was evaluated by physical therapy with recommendations for SNF at discharge.  She improved with fluid resuscitation and supportive care.   Interval History:  Slightly more shaking in her UE's noted this morning but she is alert and oriented. She does not appear to be going thru any sort of w/d at this time.  She understands plan of still going to SNF/rehab at discharge.   Old records reviewed in assessment of this patient  ROS: Constitutional: positive for fatigue and malaise, Cardiovascular: negative for chest pain, Gastrointestinal: negative for abdominal pain and Genitourinary:negative for dysuria  Assessment & Plan: Fall -Mechanical fall at home.  Etiology considered due to worsening frailty/deconditioning as well as opioid use.  Database reviewed, has not filled alprazolam since 03/31/2020.  Check UDS (positive for benzos and opiates). Holding off on benzo still for now with close monitoring for any w/d signs  - PT/OT eval: rec's for SNF. Awaiting placement   AKI (acute kidney injury) (HCC)-resolved as of 07/16/2020 -Considered prerenal due to poor oral intake - improved with IVF, continue  encouraging good oral intake -Daily BMP  Opioid dependence ([place]) -Last filled oxycodone on 06/18/2020.  Tablets are 15 mg and she receives 120 for quantity.  Discussed bedside on 07/15/2020, she is actually cutting her tablet in approximately 3 or 4 pieces and takes them about 4 times a day -Decrease oxycodone to 5 mg q6h PRN; we also discussed further decreasing of her dosage and frequency while in the hospital, she seems amenable to this for now; pain is chronic back pain mostly   Rib fracture - due to fall; CXR shows Left 7th and 8th rib fx - continue supportive care - already on oxy at home; d/c morphine as patient too frail and does not appear to be in severe pain   Hypothyroidism -TSH normal -Continue Synthroid  Hypotension-resolved as of 07/14/2020 -Considered in setting of pain medication and dehydration -Hold sedating medications as able  Acute respiratory failure with hypoxia (HCC)-resolved as of 07/15/2020 -Weaned to RA -CXR shows atelectasis. -Incentive spirometer ordered   Antimicrobials: None  DVT prophylaxis: HSQ Code Status: DNR Family Communication: none present Disposition Plan: Status is: Inpatient  Remains inpatient appropriate because:Unsafe d/c plan, IV treatments appropriate due to intensity of illness or inability to take PO and Inpatient level of care appropriate due to severity of illness   Dispo: The patient is from: Home              Anticipated d/c is to: SNF              Anticipated d/c date is: When bed available  Patient currently is medically stable to d/c.  Objective: Blood pressure 137/63, pulse (!) 108, temperature 97.8 F (36.6 C), temperature source Oral, resp. rate 19, height 5\' 9"  (1.753 m), weight 61.8 kg, SpO2 92 %.  Examination: General appearance: Pleasant but chronically ill-appearing frail elderly woman resting in bed in no distress Head: Normocephalic, without obvious abnormality, atraumatic Eyes: EOMI Lungs:  clear to auscultation bilaterally Heart: regular rate and rhythm and S1, S2 normal Abdomen: normal findings: bowel sounds normal and soft, non-tender Extremities: Lower extremity edema noted Skin: Scattered bruising and senile purpura Neurologic: Diffuse weakness but no obvious focal deficits  Consultants:   None  Procedures:   None  Data Reviewed: I have personally reviewed following labs and imaging studies No results found for this or any previous visit (from the past 24 hour(s)).  Recent Results (from the past 240 hour(s))  Resp Panel by RT-PCR (Flu A&B, Covid) Nasopharyngeal Swab     Status: None   Collection Time: 07/13/20  8:17 PM   Specimen: Nasopharyngeal Swab; Nasopharyngeal(NP) swabs in vial transport medium  Result Value Ref Range Status   SARS Coronavirus 2 by RT PCR NEGATIVE NEGATIVE Final    Comment: (NOTE) SARS-CoV-2 target nucleic acids are NOT DETECTED.  The SARS-CoV-2 RNA is generally detectable in upper respiratory specimens during the acute phase of infection. The lowest concentration of SARS-CoV-2 viral copies this assay can detect is 138 copies/mL. A negative result does not preclude SARS-Cov-2 infection and should not be used as the sole basis for treatment or other patient management decisions. A negative result may occur with  improper specimen collection/handling, submission of specimen other than nasopharyngeal swab, presence of viral mutation(s) within the areas targeted by this assay, and inadequate number of viral copies(<138 copies/mL). A negative result must be combined with clinical observations, patient history, and epidemiological information. The expected result is Negative.  Fact Sheet for Patients:  EntrepreneurPulse.com.au  Fact Sheet for Healthcare Providers:  IncredibleEmployment.be  This test is no t yet approved or cleared by the [place] FDA and  has been authorized for detection and/or  diagnosis of SARS-CoV-2 by FDA under an Emergency Use Authorization (EUA). This EUA will remain  in effect (meaning this test can be used) for the duration of the COVID-19 declaration under Section 564(b)(1) of the Act, 21 U.S.C.section 360bbb-3(b)(1), unless the authorization is terminated  or revoked sooner.       Influenza A by PCR NEGATIVE NEGATIVE Final   Influenza B by PCR NEGATIVE NEGATIVE Final    Comment: (NOTE) The Xpert Xpress SARS-CoV-2/FLU/RSV plus assay is intended as an aid in the diagnosis of influenza from Nasopharyngeal swab specimens and should not be used as a sole basis for treatment. Nasal washings and aspirates are unacceptable for Xpert Xpress SARS-CoV-2/FLU/RSV testing.  Fact Sheet for Patients: EntrepreneurPulse.com.au  Fact Sheet for Healthcare Providers: IncredibleEmployment.be  This test is not yet approved or cleared by the [place] FDA and has been authorized for detection and/or diagnosis of SARS-CoV-2 by FDA under an Emergency Use Authorization (EUA). This EUA will remain in effect (meaning this test can be used) for the duration of the COVID-19 declaration under Section 564(b)(1) of the Act, 21 U.S.C. section 360bbb-3(b)(1), unless the authorization is terminated or revoked.  Performed at [facility], [place] [address] [place], [place] [address]      Radiology Studies: No results found. DG Ribs Unilateral W/Chest Left  Final Result      Scheduled Meds: .  ARIPiprazole  2 mg Oral Daily  . buPROPion  450 mg Oral Daily  . busPIRone  30 mg Oral TID  . donepezil  5 mg Oral QHS  . feeding supplement  237 mL Oral BID BM  . folic acid  1 mg Oral QHS  . heparin  5,000 Units Subcutaneous Q8H  . ipratropium-albuterol  3 mL Nebulization BID  . levothyroxine  25 mcg Oral QAC breakfast  . lidocaine  1 patch Transdermal Q24H  . multivitamin with minerals  1 tablet Oral Daily  . pantoprazole  40 mg  Oral Daily  . tamsulosin  0.4 mg Oral Daily   PRN Meds: acetaminophen **OR** acetaminophen, ipratropium-albuterol, ondansetron **OR** ondansetron (ZOFRAN) IV, oxyCODONE, polyethylene glycol, traZODone Continuous Infusions:   LOS: 5 days  Time spent: Greater than 50% of the 35 minute visit was spent in counseling/coordination of care for the patient as laid out in the A&P.   Dwyane Dee, MD [facility] 07/18/2020, 10:11 AM

## 2020-07-18 NOTE — Care Management Important Message (Signed)
Important Message  Patient Details  Name: Traci [name] MRN: [id number] Date of Birth: Oct 02, 1945   Medicare Important Message Given:  Yes     Shelda Altes 07/18/2020, 9:14 AM

## 2020-07-18 NOTE — Plan of Care (Signed)
  Problem: Education: Goal: Knowledge of General Education information will improve Description Including pain rating scale, medication(s)/side effects and non-pharmacologic comfort measures Outcome: Progressing   

## 2020-07-18 NOTE — Progress Notes (Signed)
Pt has consistently been removing pulse oximeter throughout the day and therefore oxygen saturation has only been intermittently recorded. Pt has been consistently educated to keep it on and pt has verbalized understanding.

## 2020-07-18 NOTE — Progress Notes (Signed)
Mobility Specialist: Progress Note   07/18/20 1203  Mobility  Activity Sat and stood x 3  Level of Assistance Minimal assist, patient does 75% or more  Assistive Device Front wheel walker  Mobility Response Tolerated fair  Mobility performed by Mobility specialist  Bed Position Semi-fowlers  $Mobility charge 1 Mobility   Pre-Mobility on 1.5 L/min [place]: 126 HR, 96% SpO2 During Mobility on 1.5 L/min [place]: 133 HR Post-Mobility on 1.5 L/min [place]: 123 HR, 128/53 BP, 91% SpO2  Pt was modA during supine to sit transfer and minA during sit to stand. Pt stood for roughly 20-25 seconds each bout.   [facility] Letesha Klecker Mobility Specialist

## 2020-07-19 DIAGNOSIS — J9601 Acute respiratory failure with hypoxia: Secondary | ICD-10-CM | POA: Diagnosis not present

## 2020-07-19 LAB — RESP PANEL BY RT-PCR (FLU A&B, COVID) ARPGX2
Influenza A by PCR: NEGATIVE
Influenza B by PCR: NEGATIVE
SARS Coronavirus 2 by RT PCR: NEGATIVE

## 2020-07-19 NOTE — Plan of Care (Signed)
Most care plan goals are progressing well. However, due to patients intermittent forgetfulness/confusion, she is unable to effectively learn about self management of her health conditions.

## 2020-07-19 NOTE — Progress Notes (Signed)
When in the process of changing the PIV dressing to the RUE, the area was noted to have a knot to the lateral side as well as significant redness around one of the IV catheter wings. Additionally, there appeared to be significant amount of dried blood in the proximal portion of the IV catheter. Since the patient is not receiving any IV meds, PIV discontinued at this time.

## 2020-07-19 NOTE — TOC Progression Note (Signed)
Transition of Care [facility]) - Progression Note    Patient Details  Name: Traci [name] MRN: [id number] Date of Birth: October 14, 1945  [facility] of [facility]) CM/SW [place], [place] Phone Number: 07/19/2020, 3:59 PM  Clinical Narrative:    CSW reached out to pt's daughter Traci [name], went over two current offers. Traci [name] states she would like to discuss her two offers with her father and will have a decision tomorrow. CSW will continue follow.    Expected Discharge Plan: [place] Barriers to Discharge: Continued Medical Work up, Ship broker  Expected Discharge Plan and Services Expected Discharge Plan: [place] In-house Referral: Clinical Social Work     Living arrangements for the past 2 months: Single Family Home                                       Social Determinants of Health (SDOH) Interventions    Readmission Risk Interventions No flowsheet data found.

## 2020-07-19 NOTE — Progress Notes (Signed)
Patient alert and oriented x 3 with intermittent forgetfulness and confusion noted. Resps even and unlabored, lungs clear to auscultation throughout. She is remaining well oxygenated on room air. Bowel sounds are low-normo active in all quads, but she has had 2 bowel movements this am, both soft. She continues to use the pure wick for urination and is able to call for bed pan as needed. She has no edema to her extremities and skin tear to left arm is currently dressed with a kerlix bandage. IV to RUE has some old drainage to the dressing, so this will be changed today. Patient medicated for pain level of 5-6 with PRN oxy. She does have tremors of her upper extremities and requires occasional assist with fine motor skills required activities such as balancing food on a utensil and eating. Goal is to discharge to a skilled nursing facility when a bed is available. Call light in reach, will continue to monitor.

## 2020-07-19 NOTE — Progress Notes (Signed)
PROGRESS NOTE  Traci [name]  DOB: 12-13-1945  PCP: Bonnita Nasuti, MD KYH:[id number]  DOA: 07/13/2020  LOS: 6 days   Chief Complaint  Patient presents with  . Chest Pain   Brief narrative: MaxineWhiteis a74 y.o.female,with history of hypothyroidism, hypertension, hyperlipidemia, GERD, COPD, chronic lower back pain, anxiety  On 11/22, patient presented to the ER after a fall while attempting to transfer from her chair to the wheelchair.   Her appetite and nutritional intake has been extremely poor; often times only eating 1 meal a day and minimal sips of water. Due to her worsening weakness and fall at home, she was brought to the ER for further evaluation and evaluation by PT.  She was evaluated by physical therapy with recommendations for SNF at discharge.  She improved with fluid resuscitation and supportive care. Currently waiting for placement.  Subjective: Patient was seen and examined this morning.  Pleasant elderly Caucasian female.  Lying in bed.  Not in distress no new symptoms  Assessment/Plan: Mechanical fall at home -Etiology considered to be worsening frailty, deconditioning, opiate use  -PT eval obtained.  SNF recommended.  AKI -Prerenal due to poor oral intake. -Renal function improved with IV fluid.  Continue to encourage oral hydration.  Opioid dependence Chronic back pain -Continue low-dose as needed oxycodone.   Ribs fracture -due to fall; CXR shows Left 7th and 8th rib fx -continue supportive care -Continue oxycodone  Hypothyroidism -TSH normal -Continue Synthroid  Hypotension -Considered in setting of pain medication and dehydration -Sedating medications on hold.  Blood pressure improved now  Acute respiratory failure with hypoxia -Currently on room air -CXR shows atelectasis. -Incentive spirometer ordered  Mobility: PT eval ordered Code Status:   Code Status: DNR  Nutritional status: Body mass index is 20.12 kg/m.      Diet Order            Diet regular Room service appropriate? Yes; Fluid consistency: Thin  Diet effective now                 DVT prophylaxis: heparin injection 5,000 Units Start: 07/13/20 2315 SCDs Start: 07/13/20 2218   Antimicrobials:  None Fluid: None Consultants: None Family Communication:  Not at bedside  Status is: Inpatient  Remains inpatient appropriate because pending placement  Dispo: The patient is from: Home              Anticipated d/c is to: SNF              Anticipated d/c date is: Whenever bed/authorization available              Patient currently is medically stable to d/c.       Infusions:    Scheduled Meds: . ARIPiprazole  2 mg Oral Daily  . buPROPion  450 mg Oral Daily  . busPIRone  30 mg Oral TID  . donepezil  5 mg Oral QHS  . feeding supplement  237 mL Oral BID BM  . folic acid  1 mg Oral QHS  . heparin  5,000 Units Subcutaneous Q8H  . ipratropium-albuterol  3 mL Nebulization BID  . levothyroxine  25 mcg Oral QAC breakfast  . lidocaine  1 patch Transdermal Q24H  . multivitamin with minerals  1 tablet Oral Daily  . pantoprazole  40 mg Oral Daily  . tamsulosin  0.4 mg Oral Daily    Antimicrobials: Anti-infectives (From admission, onward)   None      PRN meds: acetaminophen **OR** acetaminophen,  ipratropium-albuterol, ondansetron **OR** ondansetron (ZOFRAN) IV, oxyCODONE, polyethylene glycol, traZODone   Objective: Vitals:   07/19/20 0728 07/19/20 1103  BP:  136/61  Pulse:  97  Resp:  16  Temp:  97.6 F (36.4 C)  SpO2: 95% 93%    Intake/Output Summary (Last 24 hours) at 07/19/2020 1540 Last data filed at 07/19/2020 1308 Gross per 24 hour  Intake 660 ml  Output 400 ml  Net 260 ml   Filed Weights   07/16/20 0251 07/17/20 0414 07/18/20 0347  Weight: 61 kg 62.4 kg 61.8 kg   Weight change:  Body mass index is 20.12 kg/m.   Physical Exam: General exam: Not in physical distress Skin: No rashes, lesions or  ulcers. HEENT: Atraumatic, normocephalic, no obvious bleeding Lungs: Clear to auscultation bilaterally CVS: Regular rate and rhythm, no murmur GI/Abd soft, nontender, nondistended, bowel sound present CNS: Alert, awake, oriented to place and person Psychiatry: Mood appropriate Extremities: No pedal edema, no calf tenderness  Data Review: I have personally reviewed the laboratory data and studies available.  Recent Labs  Lab 07/13/20 1642 07/14/20 0025 07/15/20 0321 07/16/20 0429  WBC 8.9 8.3 6.5 5.0  NEUTROABS 7.2  --  5.2 3.7  HGB 12.0 9.7* 10.0* 9.2*  HCT 40.6 31.5* 33.3* 30.1*  MCV 90.4 88.7 88.3 88.3  PLT 214 178 152 159   Recent Labs  Lab 07/13/20 1642 07/14/20 0025 07/15/20 0321 07/16/20 0429  NA 142 141 137 142  K 3.9 4.6 3.4* 4.0  CL 102 105 102 105  CO2 25 28 26 27   GLUCOSE 112* 89 71 73  BUN 15 18 16 10   CREATININE 1.42* 1.26* 1.07* 0.98  CALCIUM 9.0 8.4* 8.5* 8.5*  MG  --  1.4* 1.7 1.9     Signed, Terrilee Croak, MD [facility] 07/19/2020

## 2020-07-19 NOTE — Progress Notes (Signed)
Mobility Specialist: Progress Note   07/19/20 1224  Mobility  Activity Sat and stood x 3  Level of Assistance Moderate assist, patient does 50-74%  Assistive Device Front wheel walker  Mobility Response Tolerated well  Mobility performed by Mobility specialist  Bed Position Semi-fowlers  $Mobility charge 1 Mobility   Pre-Mobility: 93 HR Post-Mobility: 100 HR  Pt stood for 1 minute during the first bout and 30 seconds during the second and third bouts. Pt c/o of feeling dizzy and light headed when standing. Pt also c/o feeling weak when standing.   [facility] Gianni Mihalik Mobility Specialist

## 2020-07-19 NOTE — Progress Notes (Addendum)
Physical Therapy Treatment Patient Details Name: Traci [name] MRN: [id number] DOB: 02-Apr-1946 Today's Date: 07/19/2020    History of Present Illness Pt is a 74 year old woman admitted after falling while transferring from her w/c to her bed. Pt with poor PO intake and on 3L 02 at baseline. + AKI. PMH: COPD, HTN, chronic back pain, anxiety, depression, hypothyroidism.     PT Comments    Pt progressing slowly towards her physical therapy goals. Session focused on bed level therapeutic exercises for BLE strengthening and functional mobility. Pt performing x 3 sit to stands from edge of bed with min-mod assist. SpO2 94% on 2L O2, HR 93-122 bpm. Deferred transfer to chair, stating, "it makes her feel sick." Continue to recommend SNF for ongoing Physical Therapy.      Follow Up Recommendations  SNF;Supervision/Assistance - 24 hour     Equipment Recommendations  None recommended by PT    Recommendations for Other Services       Precautions / Restrictions Precautions Precautions: Fall Restrictions Weight Bearing Restrictions: No    Mobility  Bed Mobility Overal bed mobility: Needs Assistance Bed Mobility: Supine to Sit;Sit to Supine     Supine to sit: Supervision Sit to supine: Min assist   General bed mobility comments: Supervision and increased time for supine > sit, minA for LE negotiation back into bed  Transfers Overall transfer level: Needs assistance Equipment used: Rolling walker (2 wheeled) Transfers: Sit to/from Stand Sit to Stand: Mod assist;Min assist         General transfer comment: ModA to boost up to stand initially, progressing to minA on subsquent transfers. Able to side step at edge of bed with max cues for sequencing  Ambulation/Gait                 Stairs             Wheelchair Mobility    Modified Rankin (Stroke Patients Only)       Balance Overall balance assessment: Needs assistance Sitting-balance support: Feet  unsupported Sitting balance-Leahy Scale: Fair     Standing balance support: Bilateral upper extremity supported Standing balance-Leahy Scale: Poor Standing balance comment: reliant on B UE support                            Cognition Arousal/Alertness: Awake/alert Behavior During Therapy: WFL for tasks assessed/performed Overall Cognitive Status: No family/caregiver present to determine baseline cognitive functioning                                 General Comments: Pt appropriate for basic mobility session, following all commands, requires cues for problem solving and sequencing      Exercises General Exercises - Lower Extremity Long Arc Quad: Both;10 reps;Seated Heel Slides: Both;10 reps;Seated Straight Leg Raises: Both;10 reps;Seated Hip Flexion/Marching: Both;10 reps;Seated Other Exercises Other Exercises: x3 Sit to stands from edge of bed for functional strengthening    General Comments        Pertinent Vitals/Pain Pain Assessment: Faces Faces Pain Scale: Hurts little more Pain Location: general malaise/discomfort Pain Descriptors / Indicators: Sore;Discomfort Pain Intervention(s): Monitored during session;Limited activity within patient's tolerance    Home Living                      Prior Function  PT Goals (current goals can now be found in the care plan section) Acute Rehab PT Goals Patient Stated Goal: agreeable to rehab prior to returning home Potential to Achieve Goals: Good Progress towards PT goals: Progressing toward goals    Frequency    Min 2X/week      PT Plan Frequency needs to be updated    Co-evaluation              AM-PAC PT "6 Clicks" Mobility   Outcome Measure  Help needed turning from your back to your side while in a flat bed without using bedrails?: None Help needed moving from lying on your back to sitting on the side of a flat bed without using bedrails?: None Help needed  moving to and from a bed to a chair (including a wheelchair)?: A Lot Help needed standing up from a chair using your arms (e.g., wheelchair or bedside chair)?: A Lot Help needed to walk in hospital room?: Total Help needed climbing 3-5 steps with a railing? : Total 6 Click Score: 14    End of Session Equipment Utilized During Treatment: Oxygen;Gait belt Activity Tolerance: Patient tolerated treatment well Patient left: in bed;with call bell/phone within reach;with bed alarm set Nurse Communication: Mobility status PT Visit Diagnosis: Muscle weakness (generalized) (M62.81);History of falling (Z91.81);Other abnormalities of gait and mobility (R26.89)     Time: 1001-1014 PT Time Calculation (min) (ACUTE ONLY): 13 min  Charges:  $Therapeutic Exercise: 8-22 mins                     Traci [name], PT, DPT Acute Rehabilitation Services Pager [phone] Office [phone]    Traci [name] 07/19/2020, 10:27 AM

## 2020-07-20 DIAGNOSIS — J9601 Acute respiratory failure with hypoxia: Secondary | ICD-10-CM | POA: Diagnosis not present

## 2020-07-20 NOTE — Progress Notes (Signed)
Mobility Specialist: Progress Note   07/20/20 1639  Mobility  Activity Sat and stood x 3  Level of Assistance Minimal assist, patient does 75% or more  Assistive Device Front wheel walker  Mobility Response Tolerated well  Mobility performed by Mobility specialist  Bed Position Semi-fowlers  $Mobility charge 1 Mobility   Pre-Mobility: 86 HR During Mobility:   Bout 1: 121 HR  Bout 2: 119 HR  Bout 3: 129 HR Post-Mobility: 119 HR  Pt performed sit to stand x3 at EOB. Pt stood for 1 min 30 seconds the first bout, 45 seconds the second bout, and 45 seconds the third bout. Pt marched in place during the third bout of standing. Pt tolerated exercise better today.   [facility] Kiondre Grenz Mobility Specialist

## 2020-07-20 NOTE — TOC Progression Note (Signed)
Transition of Care [facility]) - Progression Note    Patient Details  Name: TYANNAH SANE MRN: [id number] Date of Birth: [birth year]  Transition of Care [facility]) CM/SW [place], [place] Phone Number: 07/20/2020, 3:13 PM  Clinical Narrative:    CSW tried to reach out to both of pt's daughters in reference to needing SNF choice for pt, neither one available. CSW was not able to leave vm for [place] but was able to leave on for Mickel Baas. CSW will continue to try and reach out.    Expected Discharge Plan: [place] Barriers to Discharge: Continued Medical Work up, Ship broker  Expected Discharge Plan and Services Expected Discharge Plan: [place] In-house Referral: Clinical Social Work     Living arrangements for the past 2 months: Single Family Home                                       Social Determinants of Health (SDOH) Interventions    Readmission Risk Interventions No flowsheet data found.

## 2020-07-20 NOTE — Progress Notes (Signed)
PROGRESS NOTE  Traci [name]  DOB: 02/28/1946  PCP: Bonnita Nasuti, MD OEU:[id number]  DOA: 07/13/2020  LOS: 7 days   Chief Complaint  Patient presents with  . Chest Pain   Brief narrative: MaxineWhiteis a74 y.o.female,with history of hypothyroidism, hypertension, hyperlipidemia, GERD, COPD, chronic lower back pain, anxiety  On 11/22, patient presented to the ER after a fall while attempting to transfer from her chair to the wheelchair.   Her appetite and nutritional intake has been extremely poor; often times only eating 1 meal a day and minimal sips of water. Due to her worsening weakness and fall at home, she was brought to the ER for further evaluation and evaluation by PT.  She was evaluated by physical therapy with recommendations for SNF at discharge.  She improved with fluid resuscitation and supportive care. Currently waiting for placement.  Subjective: Patient was seen and examined this morning. Pleasant elderly Caucasian female.  Confused.  Not sure where she is.  Somewhat restless.  Not in distress otherwise.    Assessment/Plan: Mechanical fall at home -Etiology considered to be worsening frailty, deconditioning, opiate use  -PT eval obtained.  SNF recommended.  AKI -Prerenal due to poor oral intake. -Renal function improved with IV fluid.  Continue to encourage oral hydration.  Opioid dependence Chronic back pain -Continue low-dose as needed oxycodone.   Ribs fracture -due to fall; CXR shows Left 7th and 8th rib fx -continue supportive care -Continue oxycodone  Hypothyroidism -TSH normal -Continue Synthroid  Hypotension -Considered in setting of pain medication and dehydration -Sedating medications on hold.  Blood pressure improved now  Acute respiratory failure with hypoxia -Currently on room air -CXR shows atelectasis. -Incentive spirometer ordered  Acute delirium -Disoriented this morning, likely because of prolonged  hospitalization.  Continue to monitor.  Mobility: PT eval ordered Code Status:   Code Status: DNR  Nutritional status: Body mass index is 19.7 kg/m.     Diet Order            Diet regular Room service appropriate? Yes; Fluid consistency: Thin  Diet effective now                 DVT prophylaxis: heparin injection 5,000 Units Start: 07/13/20 2315 SCDs Start: 07/13/20 2218   Antimicrobials:  None Fluid: None Consultants: None Family Communication:  Not at bedside  Status is: Inpatient  Remains inpatient appropriate because pending placement  Dispo: The patient is from: Home              Anticipated d/c is to: SNF              Anticipated d/c date is: Whenever bed/authorization available              Patient currently is medically stable to d/c.       Infusions:    Scheduled Meds: . ARIPiprazole  2 mg Oral Daily  . buPROPion  450 mg Oral Daily  . busPIRone  30 mg Oral TID  . donepezil  5 mg Oral QHS  . feeding supplement  237 mL Oral BID BM  . folic acid  1 mg Oral QHS  . heparin  5,000 Units Subcutaneous Q8H  . levothyroxine  25 mcg Oral QAC breakfast  . lidocaine  1 patch Transdermal Q24H  . multivitamin with minerals  1 tablet Oral Daily  . pantoprazole  40 mg Oral Daily  . tamsulosin  0.4 mg Oral Daily    Antimicrobials: Anti-infectives (From admission, onward)  None      PRN meds: acetaminophen **OR** acetaminophen, ipratropium-albuterol, ondansetron **OR** ondansetron (ZOFRAN) IV, oxyCODONE, polyethylene glycol, traZODone   Objective: Vitals:   07/20/20 0449 07/20/20 1116  BP: (!) 118/49 120/60  Pulse: (!) 103 89  Resp: 20 18  Temp: 98.3 F (36.8 C) 97.7 F (36.5 C)  SpO2: 94% 92%    Intake/Output Summary (Last 24 hours) at 07/20/2020 1203 Last data filed at 07/20/2020 0506 Gross per 24 hour  Intake 660 ml  Output 950 ml  Net -290 ml   Filed Weights   07/17/20 0414 07/18/20 0347 07/20/20 0505  Weight: 62.4 kg 61.8 kg 60.5 kg    Weight change:  Body mass index is 19.7 kg/m.   Physical Exam: General exam: Not in physical distress. Skin: No rashes, lesions or ulcers. HEENT: Atraumatic, normocephalic, no obvious bleeding Lungs: Clear to auscultation bilaterally CVS: Regular rate and rhythm, no murmur GI/Abd soft, nontender, nondistended, bowel sound present CNS: Alert, awake, confused.  Not oriented to place and person. Psychiatry: Mood appropriate Extremities: No pedal edema, no calf tenderness  Data Review: I have personally reviewed the laboratory data and studies available.  Recent Labs  Lab 07/13/20 1642 07/14/20 0025 07/15/20 0321 07/16/20 0429  WBC 8.9 8.3 6.5 5.0  NEUTROABS 7.2  --  5.2 3.7  HGB 12.0 9.7* 10.0* 9.2*  HCT 40.6 31.5* 33.3* 30.1*  MCV 90.4 88.7 88.3 88.3  PLT 214 178 152 159   Recent Labs  Lab 07/13/20 1642 07/14/20 0025 07/15/20 0321 07/16/20 0429  NA 142 141 137 142  K 3.9 4.6 3.4* 4.0  CL 102 105 102 105  CO2 25 28 26 27   GLUCOSE 112* 89 71 73  BUN 15 18 16 10   CREATININE 1.42* 1.26* 1.07* 0.98  CALCIUM 9.0 8.4* 8.5* 8.5*  MG  --  1.4* 1.7 1.9     Signed, Terrilee Croak, MD [facility] 07/20/2020

## 2020-07-20 NOTE — Progress Notes (Signed)
Occupational Therapy Treatment Patient Details Name: Traci [name] MRN: [id number] DOB: 11-08-1945 Today's Date: 07/20/2020    History of present illness Pt is a 74 year old woman admitted after falling while transferring from her w/c to her bed. Pt with poor PO intake and on 3L 02 at baseline. + AKI. PMH: COPD, HTN, chronic back pain, anxiety, depression, hypothyroidism.    OT comments  Pt stating she is tired of laying in the bed and is ready to go to rehab. She is aware they are awaiting a bed. Pt anxious. She agreed to sitting EOB for grooming and UB dressing, but declined OOB to chair, stating it is not comfortable to her. Assisted pt in finding a tv channel to distract her. Will continue to follow.   Follow Up Recommendations  SNF;Supervision/Assistance - 24 hour    Equipment Recommendations  None recommended by OT    Recommendations for Other Services      Precautions / Restrictions Precautions Precautions: Fall Restrictions Weight Bearing Restrictions: No       Mobility Bed Mobility Overal bed mobility: Needs Assistance Bed Mobility: Supine to Sit;Sit to Supine     Supine to sit: Supervision Sit to supine: Supervision   General bed mobility comments: increased time  Transfers                 General transfer comment: pt declined OOB    Balance Overall balance assessment: Needs assistance Sitting-balance support: Feet unsupported Sitting balance-Leahy Scale: Fair                                     ADL either performed or assessed with clinical judgement   ADL Overall ADL's : Needs assistance/impaired     Grooming: Brushing hair;Sitting;Moderate assistance;Wash/dry hands;Wash/dry face;Set up           Upper Body Dressing : Minimal assistance;Sitting Upper Body Dressing Details (indicate cue type and reason): changed soiled gown Lower Body Dressing: Total assistance;Bed level Lower Body Dressing Details (indicate cue type and  reason): socks                     Vision       Perception     Praxis      Cognition Arousal/Alertness: Awake/alert Behavior During Therapy: Anxious Overall Cognitive Status: No family/caregiver present to determine baseline cognitive functioning                                          Exercises     Shoulder Instructions       General Comments      Pertinent Vitals/ Pain       Pain Assessment: Faces Faces Pain Scale: Hurts even more Pain Location: R side Pain Descriptors / Indicators: Sore;Discomfort Pain Intervention(s): Monitored during session;Repositioned  Home Living                                          Prior Functioning/Environment              Frequency  Min 2X/week        Progress Toward Goals  OT Goals(current goals can now be found in the care plan section)  Progress towards OT goals: Progressing toward goals  Acute Rehab OT Goals Patient Stated Goal: agreeable to rehab prior to returning home OT Goal Formulation: With patient Time For Goal Achievement: 07/28/20 Potential to Achieve Goals: Good  Plan Discharge plan remains appropriate    Co-evaluation                 AM-PAC OT "6 Clicks" Daily Activity     Outcome Measure   Help from another person eating meals?: A Little Help from another person taking care of personal grooming?: A Lot Help from another person toileting, which includes using toliet, bedpan, or urinal?: Total Help from another person bathing (including washing, rinsing, drying)?: A Lot Help from another person to put on and taking off regular upper body clothing?: A Little Help from another person to put on and taking off regular lower body clothing?: Total 6 Click Score: 12    End of Session Equipment Utilized During Treatment: Oxygen  OT Visit Diagnosis: Unsteadiness on feet (R26.81);Muscle weakness (generalized) (M62.81);Pain;Other symptoms and signs  involving cognitive function   Activity Tolerance Patient tolerated treatment well   Patient Left in bed;with call bell/phone within reach;with bed alarm set   Nurse Communication          Time: 1188-6773 OT Time Calculation (min): 22 min  Charges: OT General Charges $OT Visit: 1 Visit OT Treatments $Self Care/Home Management : 8-22 mins  Nestor Lewandowsky, OTR/L Acute Rehabilitation Services Pager: [phone] Office: [phone] Malka So 07/20/2020, 11:01 AM

## 2020-07-21 DIAGNOSIS — N179 Acute kidney failure, unspecified: Secondary | ICD-10-CM | POA: Diagnosis not present

## 2020-07-21 LAB — CBC WITH DIFFERENTIAL/PLATELET
Abs Immature Granulocytes: 0.05 10*3/uL (ref 0.00–0.07)
Basophils Absolute: 0 10*3/uL (ref 0.0–0.1)
Basophils Relative: 0 %
Eosinophils Absolute: 0.2 10*3/uL (ref 0.0–0.5)
Eosinophils Relative: 4 %
HCT: 30.4 % — ABNORMAL LOW (ref 36.0–46.0)
Hemoglobin: 9.5 g/dL — ABNORMAL LOW (ref 12.0–15.0)
Immature Granulocytes: 1 %
Lymphocytes Relative: 26 %
Lymphs Abs: 1.2 10*3/uL (ref 0.7–4.0)
MCH: 27.2 pg (ref 26.0–34.0)
MCHC: 31.3 g/dL (ref 30.0–36.0)
MCV: 87.1 fL (ref 80.0–100.0)
Monocytes Absolute: 0.4 10*3/uL (ref 0.1–1.0)
Monocytes Relative: 8 %
Neutro Abs: 2.9 10*3/uL (ref 1.7–7.7)
Neutrophils Relative %: 61 %
Platelets: 272 10*3/uL (ref 150–400)
RBC: 3.49 MIL/uL — ABNORMAL LOW (ref 3.87–5.11)
RDW: 15.6 % — ABNORMAL HIGH (ref 11.5–15.5)
WBC: 4.8 10*3/uL (ref 4.0–10.5)
nRBC: 0 % (ref 0.0–0.2)

## 2020-07-21 LAB — BASIC METABOLIC PANEL
Anion gap: 7 (ref 5–15)
BUN: 7 mg/dL — ABNORMAL LOW (ref 8–23)
CO2: 30 mmol/L (ref 22–32)
Calcium: 8.3 mg/dL — ABNORMAL LOW (ref 8.9–10.3)
Chloride: 101 mmol/L (ref 98–111)
Creatinine, Ser: 0.92 mg/dL (ref 0.44–1.00)
GFR, Estimated: >60 mL/min (ref >60)
Glucose, Bld: 80 mg/dL (ref 70–99)
Potassium: 3.5 mmol/L (ref 3.5–5.1)
Sodium: 138 mmol/L (ref 135–145)

## 2020-07-21 LAB — MAGNESIUM: Magnesium: 2 mg/dL (ref 1.7–2.4)

## 2020-07-21 LAB — PHOSPHORUS: Phosphorus: 3.4 mg/dL (ref 2.5–4.6)

## 2020-07-21 LAB — SARS CORONAVIRUS 2 BY RT PCR (HOSPITAL ORDER, PERFORMED IN ~~LOC~~ HOSPITAL LAB): SARS Coronavirus 2: NEGATIVE

## 2020-07-21 NOTE — TOC Progression Note (Signed)
Transition of Care [facility]) - Progression Note    Patient Details  Name: Traci [name] MRN: [id number] Date of Birth: 1945/10/06  Transition of Care [facility]) CM/SW [place], [place] Phone Number: 07/21/2020, 1:44 PM  Clinical Narrative:    CSW spoke with pt's daughter Traci [name], stated they are ok with [facility]. CSW reached out to [employer] at [employer], confirmed pt can come tomorrow. [name]'s number is [phone].     Expected Discharge Plan: [place] Barriers to Discharge: Continued Medical Work up, Ship broker  Expected Discharge Plan and Services Expected Discharge Plan: [place] In-house Referral: Clinical Social Work     Living arrangements for the past 2 months: Single Family Home                                       Social Determinants of Health (SDOH) Interventions    Readmission Risk Interventions No flowsheet data found.

## 2020-07-21 NOTE — Progress Notes (Signed)
PT Cancellation Note  Patient Details Name: Traci [name] MRN: [id number] DOB: 24-Oct-1945   Cancelled Treatment:    Reason Eval/Treat Not Completed: Patient declined, no reason specified Attempted treatment session x 2 with patient refusal x2. Encouraged OOB mobility this session and importance of mobility. Patient refused. PT will re-attempt session as time allows.  Perrin Maltese, PT, DPT Acute Rehabilitation Services Pager [phone] Office [phone]    Traci [name] 07/21/2020, 12:11 PM

## 2020-07-21 NOTE — Progress Notes (Signed)
Mobility Specialist: Progress Note   07/21/20 1609  Mobility  Activity Ambulated in room  Level of Assistance Moderate assist, patient does 50-74%  Assistive Device Front wheel walker  Distance Ambulated (ft) 38 ft ((45ft, 67ft, 53ft))  Mobility Response Tolerated well  Mobility performed by Mobility specialist  Bed Position Semi-fowlers  $Mobility charge 1 Mobility   Pre-Mobility on 2 L/min [place]: 93 HR, 129/54 BP, 95% SpO2 Post-Mobility on 2 L/min [place]: 97 HR, 133/59 BP, 96% SpO2  Pt ambulated in room for 3 bouts for distances seen above. Pt experienced LOB during ambulation in room but was able to recover with assistance. Pt back to bed with call bell at her side.   [facility] Mahati Vajda Mobility Specialist

## 2020-07-21 NOTE — Progress Notes (Signed)
PROGRESS NOTE  Traci [name]  DOB: 07-Nov-1945  PCP: Bonnita Nasuti, MD SWH:[id number]  DOA: 07/13/2020  LOS: 8 days   Chief Complaint  Patient presents with  . Chest Pain   Brief narrative: MaxineWhiteis a74 y.o.female,with history of hypothyroidism, hypertension, hyperlipidemia, GERD, COPD, chronic lower back pain, anxiety  On 11/22, patient presented to the ER after a fall while attempting to transfer from her chair to the wheelchair.   Her appetite and nutritional intake has been extremely poor; often times only eating 1 meal a day and minimal sips of water. Due to her worsening weakness and fall at home, she was brought to the ER for further evaluation and evaluation by PT.  She was evaluated by physical therapy with recommendations for SNF at discharge.  She improved with fluid resuscitation and supportive care. Currently waiting for placement.  Subjective: Patient was seen and examined this morning. Not in distress. No new symptoms. Seems weak.  Assessment/Plan: Mechanical fall at home -Etiology considered to be worsening frailty, deconditioning, opiate use  -PT eval obtained.  SNF recommended.  AKI -Prerenal due to poor oral intake. -Renal function improved with IV fluid.  Continue to encourage oral hydration.  Opioid dependence Chronic back pain -Continue low-dose as needed oxycodone.   Ribs fracture -due to fall; CXR shows Left 7th and 8th rib fx -continue supportive care -Continue oxycodone  Hypothyroidism -TSH normal -Continue Synthroid  Hypotension -Considered in setting of pain medication and dehydration -Sedating medications on hold.  Blood pressure improved now  Acute respiratory failure with hypoxia -Currently on room air -CXR shows atelectasis. -Incentive spirometer ordered  Acute delirium -Disoriented intermittently, likely because of prolonged hospitalization.  Continue to monitor.  Mobility: PT eval ordered Code Status:    Code Status: DNR  Nutritional status: Body mass index is 19.73 kg/m.     Diet Order            Diet regular Room service appropriate? Yes; Fluid consistency: Thin  Diet effective now                 DVT prophylaxis: heparin injection 5,000 Units Start: 07/13/20 2315 SCDs Start: 07/13/20 2218   Antimicrobials:  None Fluid: None Consultants: None Family Communication:  Not at bedside  Status is: Inpatient  Remains inpatient appropriate because pending placement  Dispo: The patient is from: Home              Anticipated d/c is to: SNF              Anticipated d/c date is: Whenever bed/authorization available. Likely tomorrow.              Patient currently is medically stable to d/c.        Infusions:    Scheduled Meds: . ARIPiprazole  2 mg Oral Daily  . buPROPion  450 mg Oral Daily  . busPIRone  30 mg Oral TID  . donepezil  5 mg Oral QHS  . feeding supplement  237 mL Oral BID BM  . folic acid  1 mg Oral QHS  . heparin  5,000 Units Subcutaneous Q8H  . levothyroxine  25 mcg Oral QAC breakfast  . lidocaine  1 patch Transdermal Q24H  . multivitamin with minerals  1 tablet Oral Daily  . pantoprazole  40 mg Oral Daily  . tamsulosin  0.4 mg Oral Daily    Antimicrobials: Anti-infectives (From admission, onward)   None      PRN meds: acetaminophen **OR**  acetaminophen, ipratropium-albuterol, ondansetron **OR** ondansetron (ZOFRAN) IV, oxyCODONE, polyethylene glycol, traZODone   Objective: Vitals:   07/21/20 0424 07/21/20 1329  BP: 137/60 (!) 112/48  Pulse: 90 90  Resp: 18 17  Temp: 98.1 F (36.7 C) 98.6 F (37 C)  SpO2: 93% 99%    Intake/Output Summary (Last 24 hours) at 07/21/2020 1743 Last data filed at 07/21/2020 1300 Gross per 24 hour  Intake 680 ml  Output 302 ml  Net 378 ml   Filed Weights   07/18/20 0347 07/20/20 0505 07/21/20 0536  Weight: 61.8 kg 60.5 kg 60.6 kg   Weight change: 0.1 kg Body mass index is 19.73 kg/m.   Physical  Exam: General exam: Not in physical distress Skin: No rashes, lesions or ulcers. HEENT: Atraumatic, normocephalic, no obvious bleeding Lungs: Clear to auscultation bilaterally CVS: Regular rate and rhythm, no murmur GI/Abd soft, nontender, nondistended, bowel sound present CNS: Alert, awake, confused.  Not oriented to place and person. Psychiatry: Mood appropriate Extremities: No pedal edema, no calf tenderness  Data Review: I have personally reviewed the laboratory data and studies available.  Recent Labs  Lab 07/15/20 0321 07/16/20 0429 07/21/20 0836  WBC 6.5 5.0 4.8  NEUTROABS 5.2 3.7 2.9  HGB 10.0* 9.2* 9.5*  HCT 33.3* 30.1* 30.4*  MCV 88.3 88.3 87.1  PLT 152 159 272   Recent Labs  Lab 07/15/20 0321 07/16/20 0429 07/21/20 0836  NA 137 142 138  K 3.4* 4.0 3.5  CL 102 105 101  CO2 26 27 30   GLUCOSE 71 73 80  BUN 16 10 7*  CREATININE 1.07* 0.98 0.92  CALCIUM 8.5* 8.5* 8.3*  MG 1.7 1.9 2.0  PHOS  --   --  3.4     Signed, Terrilee Croak, MD [facility] 07/21/2020

## 2020-07-22 DIAGNOSIS — J9601 Acute respiratory failure with hypoxia: Secondary | ICD-10-CM | POA: Diagnosis not present

## 2020-07-22 DIAGNOSIS — W19XXXA Unspecified fall, initial encounter: Secondary | ICD-10-CM | POA: Diagnosis not present

## 2020-07-22 MED ORDER — LIDOCAINE 5 % EX PTCH: 1.0000 | MEDICATED_PATCH | CUTANEOUS | 0 refills | Status: AC

## 2020-07-22 MED ORDER — ADULT MULTIVITAMIN W/MINERALS CH: 1.0000 | ORAL_TABLET | Freq: Every day | ORAL | Status: AC

## 2020-07-22 MED ORDER — ENSURE ENLIVE PO LIQD: 237.0000 mL | Freq: Two times a day (BID) | ORAL | 12 refills | Status: AC

## 2020-07-22 MED ORDER — ACETAMINOPHEN 325 MG PO TABS: 650.0000 mg | ORAL_TABLET | Freq: Four times a day (QID) | ORAL | Status: AC | PRN

## 2020-07-22 MED ORDER — POLYETHYLENE GLYCOL 3350 17 G PO PACK: 17.0000 g | PACK | Freq: Every day | ORAL | 0 refills | Status: AC | PRN

## 2020-07-22 MED ORDER — IPRATROPIUM-ALBUTEROL 0.5-2.5 (3) MG/3ML IN SOLN: 3.0000 mL | Freq: Four times a day (QID) | RESPIRATORY_TRACT | Status: AC | PRN

## 2020-07-22 NOTE — TOC Transition Note (Signed)
Transition of Care [facility]) - CM/SW Discharge Note   Patient Details  Name: Traci [name] MRN: [id number] Date of Birth: [birth year]  Transition of Care [facility]) CM/SW Contact:  Loreta Ave, [place] Phone Number: 07/22/2020, 9:30 AM   Clinical Narrative:    Patient will DC to: [facility] Anticipated DC date: 07/22/20 Family notified: Marcelline Deist Transport by: Corey Harold   Per MD patient ready for DC to [facility]. RN to call report prior to discharge [phone]. RN, patient, patient's family, and facility notified of DC. Discharge Summary and FL2 sent to facility. DC packet on chart. Ambulance transport requested for patient.   CSW will sign off for now as social work intervention is no longer needed. Please consult [place] again if new needs arise.     Final next level of care: Skilled Nursing Facility Barriers to Discharge: Barriers Resolved   Patient Goals and CMS Choice Patient states their goals for this hospitalization and ongoing recovery are:: Get better soon CMS Medicare.gov Compare Post Acute Care list provided to:: Patient Represenative (must comment) Marisa Severin, daughter) Choice offered to / list presented to : Adult Children  Discharge Placement PASRR number recieved: 11/05/18            Patient chooses bed at: [facility] Patient to be transferred to facility by: [place] Name of family member notified: Marcelline Deist Patient and family notified of of transfer: 07/22/20  Discharge Plan and Services In-house Referral: Clinical Social Work                                   Social Determinants of Health (SDOH) Interventions     Readmission Risk Interventions Readmission Risk Prevention Plan 07/22/2020  Transportation Screening Complete  PCP or Specialist Appt within 3-5 Days Complete  HRI or [place] Complete  Social Work Consult for [place] Planning/Counseling Complete  Palliative Care Screening Not Applicable   Medication Review Press photographer) Complete  Some recent data might be hidden

## 2020-07-22 NOTE — Plan of Care (Signed)

## 2020-07-22 NOTE — Care Management Important Message (Signed)
Important Message  Patient Details  Name: Traci [name] MRN: [id number] Date of Birth: [birth year]   Medicare Important Message Given:  Yes     Delorse Lek 07/22/2020, 2:57 PM

## 2020-07-22 NOTE — Progress Notes (Signed)
Physical Therapy Treatment Patient Details Name: Traci [name] MRN: [id number] DOB: 1946-07-19 Today's Date: 07/22/2020    History of Present Illness Pt is a 74 year old woman admitted after falling while transferring from her w/c to her bed. Pt with poor PO intake and on 3L 02 at baseline. + AKI. PMH: COPD, HTN, chronic back pain, anxiety, depression, hypothyroidism.     PT Comments    Pt was seen to look at BP's given her limited time OOB to stand with PT.  Pt was as follows:  Supine 116/40, sitting 146/60, standing 124/81.  Pt is asymptomatic, able to sit up on side of bed with PT and CNA in to get further vitals.  Follow for goals of acute PT as tolerated.   Follow Up Recommendations  SNF     Equipment Recommendations  None recommended by PT    Recommendations for Other Services       Precautions / Restrictions Precautions Precautions: Fall Precaution Comments: pt is up to walk with help and minor unsteadiness Restrictions Weight Bearing Restrictions: No    Mobility  Bed Mobility Overal bed mobility: Needs Assistance Bed Mobility: Supine to Sit;Sit to Supine     Supine to sit: Supervision;Min guard Sit to supine: Supervision;Min guard      Transfers Overall transfer level: Needs assistance Equipment used: Rolling walker (2 wheeled) Transfers: Sit to/from Stand Sit to Stand: Min assist Stand pivot transfers: Min assist          Ambulation/Gait         Gait velocity: decreased   General Gait Details: sidesteps to get to [place]             Wheelchair Mobility    Modified Rankin (Stroke Patients Only)       Balance Overall balance assessment: Needs assistance Sitting-balance support: Feet supported Sitting balance-Leahy Scale: Fair     Standing balance support: Bilateral upper extremity supported;During functional activity Standing balance-Leahy Scale: Poor                              Cognition  Arousal/Alertness: Awake/alert Behavior During Therapy: Flat affect Overall Cognitive Status: Within Functional Limits for tasks assessed                                        Exercises General Exercises - Lower Extremity Ankle Circles/Pumps: AAROM;5 reps Heel Slides: AAROM;10 reps Hip ABduction/ADduction: AAROM;10 reps    General Comments General comments (skin integrity, edema, etc.): pt is stable enough sitting to get to the [facility]      Pertinent Vitals/Pain Pain Assessment: No/denies pain    Home Living                      Prior Function            PT Goals (current goals can now be found in the care plan section) Acute Rehab PT Goals Patient Stated Goal: wants to go home from hosp Progress towards PT goals: Progressing toward goals    Frequency    Min 2X/week      PT Plan Current plan remains appropriate    Co-evaluation              AM-PAC PT "6 Clicks" Mobility   Outcome Measure  Help needed turning from  your back to your side while in a flat bed without using bedrails?: None Help needed moving from lying on your back to sitting on the side of a flat bed without using bedrails?: None Help needed moving to and from a bed to a chair (including a wheelchair)?: A Little Help needed standing up from a chair using your arms (e.g., wheelchair or bedside chair)?: A Little Help needed to walk in hospital room?: A Lot Help needed climbing 3-5 steps with a railing? : Total 6 Click Score: 17    End of Session Equipment Utilized During Treatment: Oxygen;Gait belt Activity Tolerance: Patient limited by fatigue;Treatment limited secondary to medical complications (Comment) Patient left: in bed;with call bell/phone within reach;with bed alarm set Nurse Communication: Mobility status PT Visit Diagnosis: Muscle weakness (generalized) (M62.81);History of falling (Z91.81);Other abnormalities of gait and mobility (R26.89)     Time:  8016-5537 PT Time Calculation (min) (ACUTE ONLY): 32 min  Charges:  $Therapeutic Exercise: 8-22 mins $Therapeutic Activity: 8-22 mins                   Ramond Dial 07/22/2020, 5:01 PM  Mee Hives, PT MS Acute Rehab Dept. Number: [place]

## 2020-07-22 NOTE — Progress Notes (Signed)
Report given to [place] LPN at the facility. NO other needs or concerns at this time. Pt is stable at this time. No signs of distress noted. All questions answered at this time. Will continue to monitor until [facility]AR takes pt.

## 2020-07-22 NOTE — Discharge Summary (Signed)
Physician Discharge Summary  Traci [name] LFY:[id number] DOB: 1946-03-15 DOA: 07/13/2020  PCP: Traci Nasuti, MD  Admit date: 07/13/2020 Discharge date: 07/22/2020  Admitted From: Home Discharge disposition: SNF   Code Status: DNR  Diet Recommendation: Regular diet  Discharge Diagnosis:   Active Problems:   Hypothyroidism   Fall   Rib fracture   Opioid dependence ([place])   History of Present Illness / Brief narrative:  MaxineWhiteis a74 y.o.female,with history of hypothyroidism, hypertension, hyperlipidemia, GERD, COPD, chronic lower back pain, anxiety  On 11/22, patient presented to the ER after a fall while attempting to transfer from her chair to the wheelchair.   Her appetite and nutritional intake has been extremely poor; often times only eating 1 meal a day and minimal sips of water. Due to her worsening weakness and fall at home, she was brought to the ER for further evaluation and evaluation by PT.  She was evaluated by physical therapy with recommendations for SNF at discharge. She improved with fluid resuscitation and supportive care. Her hospital course was prolonged because of the need of SNF placement.  Subjective:  Seen and examined this morning.  Pleasant elderly Caucasian female.  Not in distress.  Ready to go to [facility] today  Hospital Course:  Mechanical fall at home -Etiology considered to be worsening frailty, deconditioning, opiate use  -PT eval obtained.  SNF recommended.  AKI -Prerenal due to poor oral intake. -Renal function improved with IV fluid.  Continue to encourage oral hydration. Recent Labs    07/13/20 1642 07/14/20 0025 07/15/20 0321 07/16/20 0429 07/21/20 0836  BUN 15 18 16 10  7*  CREATININE 1.42* 1.26* 1.07* 0.98 0.92   Opioid dependence Chronic back pain -Continue low-dose as needed oxycodone.   Ribs fracture -due to fall; CXR shows Left 7th and 8th rib fx -continue supportive care -Continue lidocaine patch.   Tylenol as needed.  Hypothyroidism -TSH normal -Continue Synthroid  Hypotension -Considered in setting of pain medication and dehydration -Sedating medications on hold.  Blood pressure improved now  Acute respiratory failure with hypoxia -Currently on room air -CXR shows atelectasis. -Incentive spirometer ordered  Acute delirium -Disoriented intermittently, likely because of prolonged hospitalization.   -Easily orientable  Stable for discharge to SNF today.   Wound care: Wound / Incision (Open or Dehisced) 07/13/20 Skin tear Hand Posterior;Right (Active)  Date First Assessed/Time First Assessed: 07/13/20 2230   Wound Type: Skin tear  Location: Hand  Location Orientation: Posterior;Right  Present on Admission: Yes    Assessments 07/13/2020 10:30 PM 07/20/2020 10:00 AM  Dressing Type Impregnated gauze (petrolatum);Gauze (Comment) --  Dressing Changed New --  Dressing Status Clean;Dry;Intact --  Dressing Change Frequency Other (Comment) --  Site / Wound Assessment Red;Pink --  Wound Length (cm) -- 4 cm  Wound Width (cm) -- 2.8 cm  Wound Surface Area (cm^2) -- 11.2 cm^2  Drainage Amount None --     No Linked orders to display     Wound / Incision (Open or Dehisced) 07/13/20 Skin tear Arm Lower;Posterior;Proximal;Right (Active)  Date First Assessed/Time First Assessed: 07/13/20 2230   Wound Type: Skin tear  Location: Arm  Location Orientation: Lower;Posterior;Proximal;Right  Present on Admission: Yes    Assessments 07/13/2020 10:30 PM 07/20/2020 10:00 AM  Dressing Type Foam - Lift dressing to assess site every shift;Impregnated gauze (petrolatum) --  Dressing Changed New --  Dressing Status Clean;Dry;Intact --  Dressing Change Frequency Other (Comment) --  Wound Length (cm) -- 1.1 cm  Wound  Width (cm) -- 0.8 cm  Wound Surface Area (cm^2) -- 0.88 cm^2     No Linked orders to display    Discharge Exam:   Vitals:   07/22/20 1109 07/22/20 1110 07/22/20 1116 07/22/20  1117  BP:  (!) 116/40 124/81   Pulse: 86     Resp:  19  (!) 22  Temp: 98 F (36.7 C)     TempSrc: Oral     SpO2: 100% 100%  92%  Weight:      Height:        Body mass index is 19.6 kg/m.  General exam: Pleasant, elderly Caucasian female.  Not in distress Skin: No rashes, lesions or ulcers. HEENT: Atraumatic, normocephalic, no obvious bleeding Lungs: Clear to auscultation bilaterally CVS: Regular rate and rhythm, no murmur GI/Abd soft, nontender, nondistended, bowel sound present CNS: Alert, awake, oriented to place Psychiatry: Mood appropriate Extremities: No pedal edema, no calf tenderness  Follow ups:   Discharge Instructions    Diet general   Complete by: As directed    Increase activity slowly   Complete by: As directed    Leave dressing on - Keep it clean, dry, and intact until clinic visit   Complete by: As directed       Contact information for follow-up providers    Hague, Rosalyn Charters, MD Follow up.   Specialty: Internal Medicine Contact information: [address] [place] [address] [phone]        Pixie Casino, MD .   Specialty: Cardiology Contact information: [address] [place] [address] [phone]            Contact information for after-discharge care    Destination    [facility] Preferred SNF .   Service: Skilled Nursing Contact information: [address] [place] [phone]                  Recommendations for Outpatient Follow-Up:   1. Follow-up with PCP as an outpatient  Discharge Instructions:  Follow with Primary MD Traci Nasuti, MD in 7 days   Get CBC/BMP checked in next visit within 1 week by PCP or SNF MD ( we routinely change or add medications that can affect your baseline labs and fluid status, therefore we recommend that you get the mentioned basic workup next visit with your PCP, your PCP may decide not to get them or add  new tests based on their clinical decision)  On your next visit with your PCP, please Get Medicines reviewed and adjusted.  Please request your PCP  to go over all Hospital Tests and Procedure/Radiological results at the follow up, please get all Hospital records sent to your Prim MD by signing hospital release before you go home.  Activity: As tolerated with Full fall precautions use walker/cane & assistance as needed  For Heart failure patients - Check your Weight same time everyday, if you gain over 2 pounds, or you develop in leg swelling, experience more shortness of breath or chest pain, call your Primary MD immediately. Follow Cardiac Low Salt Diet and 1.5 lit/day fluid restriction.  If you have smoked or chewed Tobacco in the last 2 yrs please stop smoking, stop any regular Alcohol  and or any Recreational drug use.  If you experience worsening of your admission symptoms, develop shortness of breath, life threatening emergency, suicidal or homicidal thoughts you must seek medical attention immediately by calling 911 or calling your  MD immediately  if symptoms less severe.  You Must read complete instructions/literature along with all the possible adverse reactions/side effects for all the Medicines you take and that have been prescribed to you. Take any new Medicines after you have completely understood and accpet all the possible adverse reactions/side effects.   Do not drive, operate heavy machinery, perform activities at heights, swimming or participation in water activities or provide baby sitting services if your were admitted for syncope or siezures until you have seen by Primary MD or a Neurologist and advised to do so again.  Do not drive when taking Pain medications.  Do not take more than prescribed Pain, Sleep and Anxiety Medications  Wear Seat belts while driving.   Please note You were cared for by a hospitalist during your hospital stay. If you have any questions about  your discharge medications or the care you received while you were in the hospital after you are discharged, you can call the unit and asked to speak with the hospitalist on call if the hospitalist that took care of you is not available. Once you are discharged, your primary care physician will handle any further medical issues. Please note that NO REFILLS for any discharge medications will be authorized once you are discharged, as it is imperative that you return to your primary care physician (or establish a relationship with a primary care physician if you do not have one) for your aftercare needs so that they can reassess your need for medications and monitor your lab values.    Allergies as of 07/22/2020      Reactions   Cephalexin Hives   Aspirin Other (See Comments)   Upset GI   Cymbalta [duloxetine Hcl] Other (See Comments)   Sees spiders   Latex    " MAKES ME ITCHY "       Medication List    STOP taking these medications   ALPRAZolam 0.5 MG tablet Commonly known as: XANAX   oxyCODONE 15 MG immediate release tablet Commonly known as: ROXICODONE     TAKE these medications   acetaminophen 325 MG tablet Commonly known as: TYLENOL Take 2 tablets (650 mg total) by mouth every 6 (six) hours as needed for mild pain (or Fever >/= 101).   albuterol 108 (90 Base) MCG/ACT inhaler Commonly known as: VENTOLIN HFA Inhale 2 puffs into the lungs every 6 (six) hours as needed for wheezing or shortness of breath.   ARIPiprazole 2 MG tablet Commonly known as: ABILIFY Take 2 mg by mouth daily.   buPROPion 150 MG 24 hr tablet Commonly known as: WELLBUTRIN XL Take 450 mg by mouth daily.   busPIRone 30 MG tablet Commonly known as: BUSPAR Take 30 mg by mouth 3 (three) times daily.   donepezil 5 MG tablet Commonly known as: ARICEPT Take 5 mg by mouth at bedtime.   feeding supplement Liqd Take 237 mLs by mouth 2 (two) times daily between meals.   folic acid 1 MG tablet Commonly  known as: FOLVITE Take 1 tablet (1 mg total) by mouth daily. What changed: when to take this   ipratropium-albuterol 0.5-2.5 (3) MG/3ML Soln Commonly known as: DUONEB Take 3 mLs by nebulization every 6 (six) hours as needed.   levothyroxine 25 MCG tablet Commonly known as: SYNTHROID Take 25 mcg by mouth daily before breakfast.   lidocaine 5 % Commonly known as: LIDODERM Place 1 patch onto the skin daily. Remove & Discard patch within 12 hours or as directed  by MD   multivitamin with minerals Tabs tablet Take 1 tablet by mouth daily. Start taking on: July 23, 2020   omeprazole 20 MG capsule Commonly known as: PRILOSEC Take 20 mg by mouth daily.   OXYGEN Inhale 2 L into the lungs as needed.   polyethylene glycol 17 g packet Commonly known as: MIRALAX / GLYCOLAX Take 17 g by mouth daily as needed for mild constipation.   tamsulosin 0.4 MG Caps capsule Commonly known as: FLOMAX Take 1 capsule (0.4 mg total) by mouth daily.   traZODone 150 MG tablet Commonly known as: DESYREL Take 300 mg by mouth at bedtime as needed for sleep.            Discharge Care Instructions  (From admission, onward)         Start     Ordered   07/22/20 0000  Leave dressing on - Keep it clean, dry, and intact until clinic visit        07/22/20 1227          Time coordinating discharge: 35 minutes  The results of significant diagnostics from this hospitalization (including imaging, microbiology, ancillary and laboratory) are listed below for reference.    Procedures and Diagnostic Studies:   DG Ribs Unilateral W/Chest Left  Result Date: 07/13/2020 CLINICAL DATA:  Status post fall with left-sided rib pain. EXAM: LEFT RIBS AND CHEST - 3+ VIEW COMPARISON:  July 23, 2018 FINDINGS: The lungs are hyperinflated. Moderate severity emphysematous lung disease is seen. Mild diffuse chronic appearing increased interstitial lung markings are noted. Mild atelectasis and/or infiltrate is  also seen within the right upper lobe. There is no evidence of a pleural effusion or pneumothorax. A stable 6 mm calcified lung nodule is seen within the mid to upper left lung. Nondisplaced seventh and eighth left rib fractures are seen. There is no evidence of pneumothorax or pleural effusion. Heart size and mediastinal contours are within normal limits. Radiopaque surgical clips are seen within the right upper quadrant. IMPRESSION: 1. COPD and chronic interstitial lung disease with mild right upper lobe atelectasis and/or infiltrate. 2. Nondisplaced seventh and eighth left rib fractures. Electronically Signed   By: Virgina Norfolk M.D.   On: 07/13/2020 18:19     Labs:   Basic Metabolic Panel: Recent Labs  Lab 07/16/20 0429 07/21/20 0836  NA 142 138  K 4.0 3.5  CL 105 101  CO2 27 30  GLUCOSE 73 80  BUN 10 7*  CREATININE 0.98 0.92  CALCIUM 8.5* 8.3*  MG 1.9 2.0  PHOS  --  3.4   GFR Estimated Creatinine Clearance: 49.4 mL/min (by C-G formula based on SCr of 0.92 mg/dL). Liver Function Tests: No results for input(s): AST, ALT, ALKPHOS, BILITOT, PROT, ALBUMIN in the last 168 hours. No results for input(s): LIPASE, AMYLASE in the last 168 hours. No results for input(s): AMMONIA in the last 168 hours. Coagulation profile No results for input(s): INR, PROTIME in the last 168 hours.  CBC: Recent Labs  Lab 07/16/20 0429 07/21/20 0836  WBC 5.0 4.8  NEUTROABS 3.7 2.9  HGB 9.2* 9.5*  HCT 30.1* 30.4*  MCV 88.3 87.1  PLT 159 272   Cardiac Enzymes: No results for input(s): CKTOTAL, CKMB, CKMBINDEX, TROPONINI in the last 168 hours. BNP: Invalid input(s): POCBNP CBG: No results for input(s): GLUCAP in the last 168 hours. D-Dimer No results for input(s): DDIMER in the last 72 hours. Hgb A1c No results for input(s): HGBA1C in the last 72  hours. Lipid Profile No results for input(s): CHOL, HDL, LDLCALC, TRIG, CHOLHDL, LDLDIRECT in the last 72 hours. Thyroid function studies No  results for input(s): TSH, T4TOTAL, T3FREE, THYROIDAB in the last 72 hours.  Invalid input(s): FREET3 Anemia work up No results for input(s): VITAMINB12, FOLATE, FERRITIN, TIBC, IRON, RETICCTPCT in the last 72 hours. Microbiology Recent Results (from the past 240 hour(s))  Resp Panel by RT-PCR (Flu A&B, Covid) Nasopharyngeal Swab     Status: None   Collection Time: 07/13/20  8:17 PM   Specimen: Nasopharyngeal Swab; Nasopharyngeal(NP) swabs in vial transport medium  Result Value Ref Range Status   SARS Coronavirus 2 by RT PCR NEGATIVE NEGATIVE Final    Comment: (NOTE) SARS-CoV-2 target nucleic acids are NOT DETECTED.  The SARS-CoV-2 RNA is generally detectable in upper respiratory specimens during the acute phase of infection. The lowest concentration of SARS-CoV-2 viral copies this assay can detect is 138 copies/mL. A negative result does not preclude SARS-Cov-2 infection and should not be used as the sole basis for treatment or other patient management decisions. A negative result may occur with  improper specimen collection/handling, submission of specimen other than nasopharyngeal swab, presence of viral mutation(s) within the areas targeted by this assay, and inadequate number of viral copies(<138 copies/mL). A negative result must be combined with clinical observations, patient history, and epidemiological information. The expected result is Negative.  Fact Sheet for Patients:  EntrepreneurPulse.com.au  Fact Sheet for Healthcare Providers:  IncredibleEmployment.be  This test is no t yet approved or cleared by the [place] FDA and  has been authorized for detection and/or diagnosis of SARS-CoV-2 by FDA under an Emergency Use Authorization (EUA). This EUA will remain  in effect (meaning this test can be used) for the duration of the COVID-19 declaration under Section 564(b)(1) of the Act, 21 U.S.C.section 360bbb-3(b)(1), unless the  authorization is terminated  or revoked sooner.       Influenza A by PCR NEGATIVE NEGATIVE Final   Influenza B by PCR NEGATIVE NEGATIVE Final    Comment: (NOTE) The Xpert Xpress SARS-CoV-2/FLU/RSV plus assay is intended as an aid in the diagnosis of influenza from Nasopharyngeal swab specimens and should not be used as a sole basis for treatment. Nasal washings and aspirates are unacceptable for Xpert Xpress SARS-CoV-2/FLU/RSV testing.  Fact Sheet for Patients: EntrepreneurPulse.com.au  Fact Sheet for Healthcare Providers: IncredibleEmployment.be  This test is not yet approved or cleared by the [place] FDA and has been authorized for detection and/or diagnosis of SARS-CoV-2 by FDA under an Emergency Use Authorization (EUA). This EUA will remain in effect (meaning this test can be used) for the duration of the COVID-19 declaration under Section 564(b)(1) of the Act, 21 U.S.C. section 360bbb-3(b)(1), unless the authorization is terminated or revoked.  Performed at [facility], [place] [address], [place], [place] [address]   Resp Panel by RT-PCR (Flu A&B, Covid) Nasopharyngeal Swab     Status: None   Collection Time: 07/19/20 11:56 AM   Specimen: Nasopharyngeal Swab; Nasopharyngeal(NP) swabs in vial transport medium  Result Value Ref Range Status   SARS Coronavirus 2 by RT PCR NEGATIVE NEGATIVE Final    Comment: (NOTE) SARS-CoV-2 target nucleic acids are NOT DETECTED.  The SARS-CoV-2 RNA is generally detectable in upper respiratory specimens during the acute phase of infection. The lowest concentration of SARS-CoV-2 viral copies this assay can detect is 138 copies/mL. A negative result does not preclude SARS-Cov-2 infection and should not be used as the  sole basis for treatment or other patient management decisions. A negative result may occur with  improper specimen collection/handling, submission of specimen other than  nasopharyngeal swab, presence of viral mutation(s) within the areas targeted by this assay, and inadequate number of viral copies(<138 copies/mL). A negative result must be combined with clinical observations, patient history, and epidemiological information. The expected result is Negative.  Fact Sheet for Patients:  EntrepreneurPulse.com.au  Fact Sheet for Healthcare Providers:  IncredibleEmployment.be  This test is no t yet approved or cleared by the [place] FDA and  has been authorized for detection and/or diagnosis of SARS-CoV-2 by FDA under an Emergency Use Authorization (EUA). This EUA will remain  in effect (meaning this test can be used) for the duration of the COVID-19 declaration under Section 564(b)(1) of the Act, 21 U.S.C.section 360bbb-3(b)(1), unless the authorization is terminated  or revoked sooner.       Influenza A by PCR NEGATIVE NEGATIVE Final   Influenza B by PCR NEGATIVE NEGATIVE Final    Comment: (NOTE) The Xpert Xpress SARS-CoV-2/FLU/RSV plus assay is intended as an aid in the diagnosis of influenza from Nasopharyngeal swab specimens and should not be used as a sole basis for treatment. Nasal washings and aspirates are unacceptable for Xpert Xpress SARS-CoV-2/FLU/RSV testing.  Fact Sheet for Patients: EntrepreneurPulse.com.au  Fact Sheet for Healthcare Providers: IncredibleEmployment.be  This test is not yet approved or cleared by the [place] FDA and has been authorized for detection and/or diagnosis of SARS-CoV-2 by FDA under an Emergency Use Authorization (EUA). This EUA will remain in effect (meaning this test can be used) for the duration of the COVID-19 declaration under Section 564(b)(1) of the Act, 21 U.S.C. section 360bbb-3(b)(1), unless the authorization is terminated or revoked.  Performed at [facility], [place] [address] [place], [place] [address]    SARS Coronavirus 2 by RT PCR (hospital order, performed in [facility] hospital lab) Nasopharyngeal Nasopharyngeal Swab     Status: None   Collection Time: 07/21/20 11:28 AM   Specimen: Nasopharyngeal Swab  Result Value Ref Range Status   SARS Coronavirus 2 NEGATIVE NEGATIVE Final    Comment: (NOTE) SARS-CoV-2 target nucleic acids are NOT DETECTED.  The SARS-CoV-2 RNA is generally detectable in upper and lower respiratory specimens during the acute phase of infection. The lowest concentration of SARS-CoV-2 viral copies this assay can detect is 250 copies / mL. A negative result does not preclude SARS-CoV-2 infection and should not be used as the sole basis for treatment or other patient management decisions.  A negative result may occur with improper specimen collection / handling, submission of specimen other than nasopharyngeal swab, presence of viral mutation(s) within the areas targeted by this assay, and inadequate number of viral copies (<250 copies / mL). A negative result must be combined with clinical observations, patient history, and epidemiological information.  Fact Sheet for Patients:   StrictlyIdeas.no  Fact Sheet for Healthcare Providers: BankingDealers.co.za  This test is not yet approved or  cleared by the [place] FDA and has been authorized for detection and/or diagnosis of SARS-CoV-2 by FDA under an Emergency Use Authorization (EUA).  This EUA will remain in effect (meaning this test can be used) for the duration of the COVID-19 declaration under Section 564(b)(1) of the Act, 21 U.S.C. section 360bbb-3(b)(1), unless the authorization is terminated or revoked sooner.  Performed at [facility], [place] [address] [place], [place] [address]      Signed: Marlowe Aschoff Connie Lasater  [facility] 07/22/2020, 12:27 PM

## 2020-07-22 NOTE — Progress Notes (Signed)
D/C instructions printed and placed in packet at nurse's station. Tele and IV removed, tolerated well.  

## 2020-08-25 ENCOUNTER — Ambulatory Visit: Payer: Medicare Other | Admitting: Physician Assistant

## 2020-09-09 ENCOUNTER — Ambulatory Visit: Payer: Medicare Other | Admitting: Medical

## 2020-09-28 ENCOUNTER — Encounter: Payer: Self-pay | Admitting: Gastroenterology

## 2020-12-18 DEATH — deceased
